# Patient Record
Sex: Male | Born: 1940 | Race: White | Hispanic: No | Marital: Married | Smoking: Former smoker
Health system: Southern US, Community
[De-identification: ages and names within clinical notes are randomized; demographics above are authoritative.]

## PROBLEM LIST (undated history)

## (undated) DIAGNOSIS — I499 Cardiac arrhythmia, unspecified: Secondary | ICD-10-CM

## (undated) DIAGNOSIS — C61 Malignant neoplasm of prostate: Secondary | ICD-10-CM

## (undated) DIAGNOSIS — M1711 Unilateral primary osteoarthritis, right knee: Secondary | ICD-10-CM

## (undated) DIAGNOSIS — I10 Essential (primary) hypertension: Secondary | ICD-10-CM

## (undated) DIAGNOSIS — E7800 Pure hypercholesterolemia, unspecified: Secondary | ICD-10-CM

## (undated) DIAGNOSIS — G4730 Sleep apnea, unspecified: Secondary | ICD-10-CM

## (undated) DIAGNOSIS — I639 Cerebral infarction, unspecified: Secondary | ICD-10-CM

## (undated) DIAGNOSIS — C801 Malignant (primary) neoplasm, unspecified: Secondary | ICD-10-CM

## (undated) DIAGNOSIS — R0600 Dyspnea, unspecified: Secondary | ICD-10-CM

## (undated) HISTORY — PX: OTHER SURGICAL HISTORY: SHX169

## (undated) HISTORY — PX: EYE SURGERY: SHX253

## (undated) HISTORY — PX: COLONOSCOPY: SHX174

## (undated) HISTORY — PX: KNEE ARTHROSCOPY: SHX127

## (undated) HISTORY — DX: Unilateral primary osteoarthritis, right knee: M17.11

## (undated) HISTORY — PX: TONSILLECTOMY: SUR1361

---

## 2004-10-30 ENCOUNTER — Inpatient Hospital Stay (HOSPITAL_COMMUNITY): Admission: EM | Admit: 2004-10-30 | Discharge: 2004-10-31 | Payer: Self-pay | Admitting: Emergency Medicine

## 2005-03-03 ENCOUNTER — Encounter: Admission: RE | Admit: 2005-03-03 | Discharge: 2005-03-03 | Payer: Self-pay | Admitting: Neurology

## 2006-01-02 ENCOUNTER — Encounter: Admission: RE | Admit: 2006-01-02 | Discharge: 2006-01-02 | Payer: Self-pay | Admitting: Interventional Cardiology

## 2006-01-08 ENCOUNTER — Inpatient Hospital Stay (HOSPITAL_BASED_OUTPATIENT_CLINIC_OR_DEPARTMENT_OTHER): Admission: RE | Admit: 2006-01-08 | Discharge: 2006-01-08 | Payer: Self-pay | Admitting: Interventional Cardiology

## 2010-02-26 ENCOUNTER — Ambulatory Visit (HOSPITAL_BASED_OUTPATIENT_CLINIC_OR_DEPARTMENT_OTHER): Admission: RE | Admit: 2010-02-26 | Discharge: 2010-02-26 | Payer: Self-pay | Admitting: Orthopedic Surgery

## 2010-07-02 LAB — BASIC METABOLIC PANEL
BUN: 12 mg/dL (ref 6–23)
CO2: 33 mEq/L — ABNORMAL HIGH (ref 19–32)
Calcium: 9.1 mg/dL (ref 8.4–10.5)
Chloride: 103 mEq/L (ref 96–112)
Creatinine, Ser: 0.88 mg/dL (ref 0.4–1.5)
GFR calc Af Amer: >60 mL/min (ref >60)
GFR calc non Af Amer: >60 mL/min (ref >60)
Glucose, Bld: 101 mg/dL — ABNORMAL HIGH (ref 70–99)
Potassium: 4.2 mEq/L (ref 3.5–5.1)
Sodium: 141 mEq/L (ref 135–145)

## 2010-07-02 LAB — POCT HEMOGLOBIN-HEMACUE: Hemoglobin: 16.2 g/dL (ref 13.0–17.0)

## 2010-09-06 NOTE — H&P (Signed)
NAME:  WASIM, HURLBUT NO.:  [id number]   MEDICAL RECORD NO.:  [id number]          PATIENT TYPE:  INP   LOCATION:  2012                         FACILITY:  [facility]   PHYSICIAN:  Marolyn Hammock. Reynolds, M.D.DATE OF BIRTH:  30-Dec-1940   DATE OF ADMISSION:  10/30/2004  DATE OF DISCHARGE:                                HISTORY & PHYSICAL   CHIEF COMPLAINT:  Right-sided numbness.   HISTORY OF PRESENT ILLNESS:  This is the initial [facility] system admission for this 70 year old male with no real past  medical history. He had the onset today of right-handed numbness and  tingling sensation at about 2 p.m. which initially came and went but  eventually came on and stayed. Shortly, thereafter, he noticed numbness on  the right side of his face and tongue and also some numbness on the right  foot as well. He has had transient right-handed numbness in the past but has  never had symptoms in the face or foot before. He came to the emergency room  for evaluation. He denies any associated slurred speech, visual changes,  weakness, or clumsiness in the extremities. He denies any associated chest  pain, headache, shortness of breath, nausea, vomiting, or loss of  consciousness. He is admitted for further evaluation of suspected stroke  syndrome.   PAST MEDICAL HISTORY:  He was told several years ago that he had a right  bundle branch block on a routine EKG. He denies any history of hypertension,  diabetes, or other chronic medical problems.   FAMILY HISTORY:  His mother died of a stroke at age 66.   SOCIAL HISTORY:  He smokes cigars daily. He consumes alcohol about twice a  week. He works in [employer].   ALLERGIES:  No known drug allergies.   REVIEW OF SYSTEMS:  Full 10-point review of systems is negative except as  outlined in the HPI and admission nursing record.   PHYSICAL EXAMINATION:  VITAL SIGNS:  Temperature 99.2, blood pressure  139/75, pulse 69,  respirations 18.  GENERAL:  This is a healthy-appearing man supine in no evident distress.  HEENT:  Normocephalic and atraumatic. Oropharynx is benign.  NECK:  Supple without carotids or supraclavicular bruits.  HEART:  Regular rate and rhythm without murmurs.  CHEST:  Clear to auscultation bilaterally.  ABDOMEN:  Soft. No hepatosplenomegaly. Normal active bowel sounds.  EXTREMITIES:  No edema. There are 1+ pulses.  NEUROLOGICAL:  He is awake, alert, and fully oriented to time, place, and  person. Recent and remote memory are intact. Attention span, concentration,  and fund of knowledge are all appropriate. Speech is fluent and not  dysarthric. He has no defects to confrontational naming. Can repeat phrase.  Mood is euthymic and affect appropriate. Cranial nerves:  Funduscopic exam  is benign. Pupils are equal and briskly reactive. Extraocular movements full  without nystagmus. Visual fields are full to confrontation. Hearing is  intact to finger rub. Facial sensation is intact to pinprick. Face, tongue,  and palpate move normally and symmetrically. Shoulder shrug strength is  normal. Motor testing shows  normal bulk and tone. Normal strength in all  tested extremity muscles. Sensation:  He reports diminished pinprick  sensation over the palmar surface of the fingers of the right hand.  Otherwise intact to pinprick and light touch throughout. Coordination and  rapid movement are full and well. Finger-to-nose are full and well. Gait:  He arises from the gurney easily and his stance is normal. He is able to  ambulate without much difficulty. Reflexes are 2+ and symmetric. Toes are  downgoing.   LABORATORY DATA:  CBC is pending at this time. Chemistries are remarkable  for a mildly elevated glucose of 111. Coagulations are normal. CT of the  head is personally reviewed and the study is normal.   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 He has  no known risk factors for cerebrovascular disease.   PLAN:  We will admit for a routine stroke workup including MRI, MRA, carotid  transcranial Doppler's, 2-D echocardiogram, telemetry, and stroke labs. We  will treat with aspirin until the mechanism of his stroke syndrome is more  clear. Stroke service to follow.       MLR/MEDQ  D:  10/30/2004  T:  10/31/2004  Job:  [id number]   cc:   Oley Balm. Georgina Pillion, M.D.  [address]  [place]  [place] [address]  Fax: [phone]

## 2010-09-06 NOTE — H&P (Signed)
NAME:  SHAHIEM, Barton NO.:  [id number]   MEDICAL RECORD NO.:  [id number]          PATIENT TYPE:  INP   LOCATION:  2012                         FACILITY:  [facility]   PHYSICIAN:  Sherin Quarry, MD      DATE OF BIRTH:  1940/11/02   DATE OF ADMISSION:  10/30/2004  DATE OF DISCHARGE:                                HISTORY & PHYSICAL   HISTORY OF PRESENT ILLNESS:  Caleb Barton is a 70 year old man whose past  history is remarkable only for a history of sleep apnea for which he uses  nasal C-PAP. It is also noteworthy that he smokes cigars regularly. The  patient states that he works in his home and today about 2 p.m. he was using  the computer when he noted that the right hand on the mouse felt numb and  tingling. On further examination, the tingling and paresthesias seemed to be  confined to the palmar surface of the hand and to involve all the fingers.  It seemed to spare the dorsum of the hand. He shook his hand for several  minutes but it did not seem to get any better. After about 15 minutes, the  paresthesias went away and then about 5 minutes later he noted that once  again his hand was tingling and he also noted some tingling of the right  side of his lips and the right side of his tongue. He stood up and walked  around but it did not seem to help. While he is walking around, he  experienced transient tingling of his right foot. There was no headache, no  visual blurring, no diplopia, no motor impairment, no ataxia or falling.  There was no syncope. Eventually he called his wife and said that he thought  he had better come to the hospital to be checked. On arrival in the  emergency room, he was noted to have a blood pressure 139/75, pulse was 69,  respirations were 18. He was sent for CT scan of the brain which I reviewed  with the radiologist and was normal. Laboratory studies obtained included an  I-stat which was normal, normal clotting studies. In light of  this  presentation, the patient will be admitted for evaluation of TIA versus  stroke.   MEDICATIONS:  The patient takes no medications. He also takes no over-the-  counter medications.   ALLERGIES:  There are no known drug allergies.   PAST SURGICAL HISTORY:  The only operation he can recall was excision of a  cyst 25 years ago.   PAST MEDICAL HISTORY:  Medical illnesses are none. Injuries are none.  Hospitalization none.   FAMILY HISTORY:  Possibly significant in that the patient's mother had a  series of TIAs and eventually had a massive stroke. It sounds like perhaps a  brain stem stroke at the age of 7 and died as a result of this. His father  died when he was 53-1/2 years old from drowning. His brother is in good  health. He also has a half-sister who is in good health.   SOCIAL  HISTORY:  The patient smokes cigars regularly. He has smoked  cigarettes the past. He states that he drinks about a six-pack of beer per  week.   REVIEW OF SYSTEMS:  HEAD:  He denies headache or dizziness. EYES:  He denies  visual blurring or diplopia. EAR, NOSE AND THROAT:  Denies earache, sinus  pain or sore throat. CHEST:  Denies coughing, wheezing or chest congestion.  CARDIOVASCULAR:  Denies orthopnea, PND or ankle edema. There has been no  history of chest pain. GI:  Denies nausea, vomiting, abdominal pain, change  in bowel habits, melena or hematochezia. GU:  Denies dysuria or urinary  frequency. NEUROLOGICAL:  See above. ENDOCRINE:  No history excessive  thirst, urinary frequency or nocturia.   PHYSICAL EXAMINATION:  VITAL SIGNS:  Temperature 99.2, blood pressure  139/75, pulse 69, respirations 18, O2 saturations 97%.  HEENT:  The pupils are equal and reactive. Extraocular movements are full.  The tympanic membranes are clear. Nares is patent. Pharynx is without  erythema or exudate.  NECK:  Neck is supple. There is no lymphadenopathy or thyromegaly.  CHEST:  The chest is clear.  BACK:   Examination the back reveals no CVA or point tenderness.  CARDIOVASCULAR:  Reveals normal S1-S2. There are no rubs, murmurs or  gallops.  ABDOMEN:  The abdomen is benign with normal bowel sounds without masses,  tenderness, organomegaly.  NEUROLOGICAL:  Cranial nerves II-XII are normal. Motor exam is normal.  Sensory exam is normal to the extent that I can determine. On cerebellar  testing, the patient is able to perform finger-nose-finger and heel-knee-  shin testing normally. Gait was not tested.   IMPRESSION:  1.  Possible left-sided transient ischemic attack versus stroke presenting      with paresthesias of lips, right hand and tongue:  This appears to be      purely sensory presentation.  2.  Possible hyperlipidemia.  3.  History of cigarette and cigars smoking.  4.  Sleep apnea by history.   PLAN:  We will obtain lipid profile and homocysteine level. We will schedule  MRI and MRA scan. We will obtain carotid Doppler and 2-D echo. We will start  the patient on Aggrenox 1 tablet p.o. b.i.d. The patient was advised in  strong terms that he must discontinue cigarette smoking.       SY/MEDQ  D:  10/30/2004  T:  10/30/2004  Job:  [id number]   cc:   Oley Balm. Georgina Pillion, M.D.  [address]  [place]  [place] [address]  Fax: [phone]

## 2010-09-06 NOTE — Discharge Summary (Signed)
NAME:  PARRY, PO NO.:  [id number]   MEDICAL RECORD NO.:  [id number]          PATIENT TYPE:  INP   LOCATION:  2012                         FACILITY:  [facility]   PHYSICIAN:  Sherin Quarry, MD      DATE OF BIRTH:  May 21, 1940   DATE OF ADMISSION:  10/30/2004  DATE OF DISCHARGE:  10/31/2004                                 DISCHARGE SUMMARY   Caleb Barton is a 69 year old man who initially presented to [facility] on July 12 with onset approximately 2 p.m. of numbness of the right  hand.  This was subsequently associated with numbness and tingling involving  the right side of his lips and right side of the tongue.  He stood up and  walked around for a few minutes but it did not seem to make any difference  in the symptoms.  He subsequently experienced transient numbness and  tingling of his right foot.  There was no associated headache, no visual  blurring or diplopia, no motor impairment.  In light of these findings, the  patient was admitted to the hospital for further evaluation.  In the  emergency room studies obtained included an I-stat which revealed potassium  of 3.5, glucose of 111, and normal renal function.  A1C hemoglobin was 5.7.  Liver profile was normal.  CT scan of the head was negative.   PHYSICAL EXAMINATION:  VITAL SIGNS:  Blood pressure was 139/75, pulse was  69, respirations 18.  HEENT:  Within normal limits.  CHEST:  Clear.  CARDIOVASCULAR:  Normal S1-S2 without rubs, murmurs, or gallops.  ABDOMEN:  Benign.  Normal bowel sounds without masses or tenderness.  NEUROLOGIC:  The patient was oriented x3.  He had normal speech.  Cranial  nerves II-XII were intact.  Motor testing was completely normal.  The  patient reported diminished sensation over the palmar surface of the fingers  of his right hand.  No other abnormalities could be detected on sensory  examination.   Subsequently, the patient underwent a carotid Doppler study which  showed no  hemodynamically significant ICA stenosis.  An echocardiogram was obtained.  The result of this study is pending.  MRI and MRA scan were done and these  results were reviewed with Dr. Pearlean Brownie.  Basically, there were two findings.  The first was a small left thalamic infarct which appeared to be acute.  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  When Dr. Pearlean Brownie reviewed the appearance of the cyst he felt it had features  suggesting that it was benign in nature.  He did not think that the cyst was  the cause of the patient's symptoms.  He recommended that a follow-up be  planned with his office in two months to repeat the MRI scan.  He discussed  these recommendations with the patient's wife.  On July 13 as the patient  was doing well it was felt reasonable to discharge him.   DISCHARGE DIAGNOSES:  1.  Acute left thalamic infarct.  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  3.  History of sleep apnea.  4.  Possible history of hyperlipidemia.  5.  History of cigarette and cigar smoking.   On discharge the patient was instructed to take Aggrenox one tablet p.o.  b.i.d.  He was advised to absolutely stop smoking at this time.  He was  advised to return to see Dr. Pearlean Brownie in the office in approximately two months  and to continue to follow up on a regular basis with Dr. Georgina Pillion at [facility] [place].   CONDITION ON DISCHARGE:  Good.       SY/MEDQ  D:  10/31/2004  T:  10/31/2004  Job:  [id number]   cc:   Oley Balm. Georgina Pillion, M.D.  [address]  [place]   [place] [address]  Fax: [phone]   Pramod P. Pearlean Brownie, MD  Fax: [phone]

## 2010-09-06 NOTE — Cardiovascular Report (Signed)
NAME:  Caleb Barton, Caleb Barton NO.:  [id number]   MEDICAL RECORD NO.:  [id number]          PATIENT TYPE:  OIB   LOCATION:  1962                         FACILITY:  [facility]   PHYSICIAN:  Lyn Records, M.D.   DATE OF BIRTH:  08/21/1940   DATE OF PROCEDURE:  01/08/2006  DATE OF DISCHARGE:                              CARDIAC CATHETERIZATION   INDICATIONS:  The patient was referred by Dr. Lajean Manes for  cardiovascular evaluation.  He has a family history and personal history of  stroke and hypertension.  His Cardiolite study demonstrated inferior  infarction with peri-infarction ischemia versus diaphragm attenuation.  This  study is being done to rule out underlying coronary artery obstructive  disease.   PROCEDURES PERFORMED:  1. Left heart catheterization.  2. Selective coronary angiography.  3. Left ventriculography.   DESCRIPTION:  After informed consent, a 4-French sheath was placed in the  right femoral artery using modified Seldinger technique.  A 4-French A2  multipurpose catheter was used for left ventricular hand injection,  hemodynamic recordings and right coronary angiography.  A 4-French JR-4 and  JL-4 were also used coronary angiography.  The patient tolerated the  procedure without complications.   RESULTS:  1. Hemodynamic data:      a.     The aortic pressure was 37/79.      b.     Left ventricular pressure 137/10 mmHg.   1. Left ventriculography:  Left ventricular cavity size and systolic      function are normal.  Ejection fraction is at least 60%.  No mitral      regurgitation is identified.   1. Coronary angiography:  The left coronary contains very minimal      calcification.      a.     Left main coronary:  Widely patent distal left main       calcification is seen.      b.     Left anterior descending coronary:  The LAD is relatively small,       giving origin to a large bifurcating first diagonal.  There is also a       large first septal  perforator and the LAD is relatively small coursing       to the distal anterior wall.  It does not wrap around the left       ventricle apex.  Minimal luminal irregularities noted in the LAD.       There is mid LAD systolic compression.      c.     Circumflex artery:  The circumflex coronary artery is relatively       small, giving origin to three obtuse marginal branches and are free of       any significant obstruction.      d.     Ramus intermedius branch:  A moderate-sized ramus branch arises       from the distal left main and is free of any significant obstruction.      e.     Right coronary artery:  Right coronary artery is  a large       dominant vessel, large PDA, large left ventricular branches.  Minimal       luminal irregularities are noted.   CONCLUSION:  1. Essentially normal coronary arteries.  2. Overall normal left ventricular function.  3. False positive Cardiolite study.      Lyn Records, M.D.  Electronically Signed     HWS/MEDQ  D:  01/08/2006  T:  01/09/2006  Job:  [id number]   cc:   Oley Balm. Georgina Pillion, M.D.

## 2011-08-20 ENCOUNTER — Ambulatory Visit (HOSPITAL_COMMUNITY)
Admission: RE | Admit: 2011-08-20 | Discharge: 2011-08-20 | Disposition: A | Discharge status: Home / Self Care | Payer: Medicare Other | Source: Ambulatory Visit | Attending: Gastroenterology | Admitting: Gastroenterology

## 2011-08-20 ENCOUNTER — Encounter (HOSPITAL_COMMUNITY)
Admission: RE | Disposition: A | Discharge status: Home / Self Care | Payer: Self-pay | Source: Ambulatory Visit | Attending: Gastroenterology

## 2011-08-20 ENCOUNTER — Encounter (HOSPITAL_COMMUNITY): Payer: Self-pay | Admitting: Gastroenterology

## 2011-08-20 DIAGNOSIS — Z79899 Other long term (current) drug therapy: Secondary | ICD-10-CM | POA: Insufficient documentation

## 2011-08-20 DIAGNOSIS — Z8673 Personal history of transient ischemic attack (TIA), and cerebral infarction without residual deficits: Secondary | ICD-10-CM | POA: Insufficient documentation

## 2011-08-20 DIAGNOSIS — Z7982 Long term (current) use of aspirin: Secondary | ICD-10-CM | POA: Insufficient documentation

## 2011-08-20 DIAGNOSIS — G4730 Sleep apnea, unspecified: Secondary | ICD-10-CM | POA: Insufficient documentation

## 2011-08-20 DIAGNOSIS — I10 Essential (primary) hypertension: Secondary | ICD-10-CM | POA: Insufficient documentation

## 2011-08-20 DIAGNOSIS — D126 Benign neoplasm of colon, unspecified: Secondary | ICD-10-CM | POA: Insufficient documentation

## 2011-08-20 HISTORY — DX: Sleep apnea, unspecified: G47.30

## 2011-08-20 HISTORY — DX: Cerebral infarction, unspecified: I63.9

## 2011-08-20 HISTORY — PX: COLONOSCOPY: SHX5424

## 2011-08-20 HISTORY — DX: Essential (primary) hypertension: I10

## 2011-08-20 HISTORY — DX: Cardiac arrhythmia, unspecified: I49.9

## 2011-08-20 SURGERY — COLONOSCOPY
Anesthesia: Moderate Sedation

## 2011-08-20 MED ORDER — FENTANYL CITRATE 0.05 MG/ML IJ SOLN
INTRAMUSCULAR | Status: AC
Start: 2011-08-20 — End: 2011-08-20
  Filled 2011-08-20: qty 4

## 2011-08-20 MED ORDER — MIDAZOLAM HCL 10 MG/2ML IJ SOLN
INTRAMUSCULAR | Status: AC
  Filled 2011-08-20: qty 4

## 2011-08-20 MED ORDER — SODIUM CHLORIDE 0.9 % IV SOLN
Freq: Once | INTRAVENOUS | Status: AC
  Administered 2011-08-20: 500 mL via INTRAVENOUS

## 2011-08-20 MED ORDER — MIDAZOLAM HCL 5 MG/5ML IJ SOLN
INTRAMUSCULAR | Status: DC | PRN
  Administered 2011-08-20: 2 mg via INTRAVENOUS
  Administered 2011-08-20: 1 mg via INTRAVENOUS
  Administered 2011-08-20 (×2): 2 mg via INTRAVENOUS

## 2011-08-20 MED ORDER — FENTANYL NICU IV SYRINGE 50 MCG/ML
INJECTION | INTRAMUSCULAR | Status: DC | PRN
  Administered 2011-08-20 (×2): 25 ug via INTRAVENOUS
  Administered 2011-08-20: 10 ug via INTRAVENOUS

## 2011-08-20 NOTE — Op Note (Signed)
[facility] [address] [place], [place]  [address]  COLONOSCOPY PROCEDURE REPORT  PATIENT:  Caleb Barton, Caleb Barton  MR#:  [id number] BIRTHDATE:  Jul 23, 1940, 70 yrs. old  GENDER:  male ENDOSCOPIST:  Carman Ching REF. BY:  Shaune Pollack, M.D. PROCEDURE DATE:  08/20/2011 PROCEDURE:  Colonoscopy with snare polypectomy ASA CLASS:  Class II INDICATIONS:  patient had a polyp removed recently from the cecum with possible and complete removal. Path report showed high-grade dysplasia. MEDICATIONS:   Fentanyl 60 mcg IV, Versed 7 mg IV  DESCRIPTION OF PROCEDURE:   After the risks and benefits and of the procedure were explained, informed consent was obtained. Digital rectal exam was performed and revealed no abnormalities. The [id number] endoscope was introduced through the anus and advanced to the cecum.  The quality of the prep was good..  The instrument was then slowly withdrawn as the colon was fully examined. <<PROCEDUREIMAGES>>  FINDINGS:  A sessile polyp was found in the cecum. a small amount of residual polyp that was located just under the ileocecal valve. Polyps were snared, then cauterized with monopolar cautery. Retrieval was successful. snare polyp 2 pieces were removed and the area cauterized with the tip of the snare with very good results. At the termination of the procedure there was no residual polyp.   Retroflexion was not performed.  The scope was then withdrawn from the patient and the procedure completed.  COMPLICATIONS:  A complication of none occured on 08/20/2011 at. ENDOSCOPIC IMPRESSION: 1) Sessile polyp in the cecum it is felt that this has been removed completely. There was no residual polyp at the termination of the procedure area RECOMMENDATIONS: 1) Hold aspirin, aspirin products, and anti-inflammatory medication for 1 week. 2) Repeat Colonoscopy in 1 year. we'll also have him hold his Plavix.colonoscopy will be repeated in one year due to the fact that  this polyp did have high-grade dysplasia  REPEAT EXAM:  In for Colonoscopy.  ______________________________ Carman Ching  CC:  Shaune Pollack, MD  n. Rosalie DoctorCarman Ching at 08/20/2011 10:23 AM  Forest Gleason, [phone]

## 2011-08-20 NOTE — Discharge Instructions (Signed)
Hold aspirin and plavix for 1 week then resume. Repeat colon 1 year

## 2011-08-20 NOTE — H&P (Signed)
  Subjective:   Patient is a 71 y.o. male presents with Polyp in cecum with HGD and need for possible APC. Procedure including risks and benefits discussed in office.  There are no active problems to display for this patient.  Past Medical History  Diagnosis Date  . Hypertension   . Dysrhythmia     RBBB  . Stroke     TIA 15 yrs ago  . Sleep apnea     on CPAP nightly    Past Surgical History  Procedure Date  . Tonsillectomy   . Colonoscopy     with polyps    Prescriptions prior to admission  Medication Sig Dispense Refill  . aspirin 325 MG EC tablet Take 325 mg by mouth daily.      [name] dipyridamole-aspirin (AGGRENOX) 25-200 MG per 12 hr capsule Take 1 capsule by mouth daily at 6 PM.      . hydrochlorothiazide (HYDRODIURIL) 25 MG tablet Take 25 mg by mouth daily.      [name] losartan (COZAAR) 50 MG tablet Take 50 mg by mouth daily.      . multivitamin-iron-minerals-folic acid (CENTRUM) chewable tablet Chew 1 tablet by mouth daily.      . niacin 500 MG tablet Take 500 mg by mouth daily with breakfast.      . simvastatin (ZOCOR) 80 MG tablet Take 80 mg by mouth at bedtime.       Allergies  Allergen Reactions  . Sulfa Antibiotics     History  Substance Use Topics  . Smoking status: Not on file  . Smokeless tobacco: Not on file  . Alcohol Use: Yes     social    Family History  Problem Relation Age of Onset  . Stroke Mother      Objective:   Patient Vitals for the past 8 hrs:  BP Temp Temp src Pulse Resp SpO2 Height Weight  08/20/11 0854 124/79 mmHg 97.8 F (36.6 C) Oral 60  13  94 % 5\' 9"  (1.753 m) 102.059 kg (225 lb)         See MD Preop evaluation      Assessment:   Active Problems:  * No active hospital problems. *    Plan:   Proceed with colon/ polyp and ? APC

## 2011-08-21 ENCOUNTER — Encounter (HOSPITAL_COMMUNITY): Payer: Self-pay | Admitting: Gastroenterology

## 2011-08-21 ENCOUNTER — Encounter (HOSPITAL_COMMUNITY): Payer: Self-pay

## 2011-10-06 ENCOUNTER — Other Ambulatory Visit: Payer: Self-pay | Admitting: Family Medicine

## 2011-10-06 DIAGNOSIS — E041 Nontoxic single thyroid nodule: Secondary | ICD-10-CM

## 2011-10-08 ENCOUNTER — Ambulatory Visit
Admission: RE | Admit: 2011-10-08 | Discharge: 2011-10-08 | Disposition: A | Discharge status: Home / Self Care | Payer: Medicare Other | Source: Ambulatory Visit | Attending: Family Medicine | Admitting: Family Medicine

## 2011-10-08 DIAGNOSIS — E041 Nontoxic single thyroid nodule: Secondary | ICD-10-CM

## 2011-10-16 ENCOUNTER — Other Ambulatory Visit: Payer: Self-pay | Admitting: Family Medicine

## 2011-10-16 DIAGNOSIS — E041 Nontoxic single thyroid nodule: Secondary | ICD-10-CM

## 2011-10-22 ENCOUNTER — Other Ambulatory Visit (HOSPITAL_COMMUNITY)
Admission: RE | Admit: 2011-10-22 | Discharge: 2011-10-22 | Disposition: A | Discharge status: Home / Self Care | Payer: Medicare Other | Source: Ambulatory Visit | Attending: Interventional Radiology | Admitting: Interventional Radiology

## 2011-10-22 ENCOUNTER — Ambulatory Visit
Admission: RE | Admit: 2011-10-22 | Discharge: 2011-10-22 | Disposition: A | Discharge status: Home / Self Care | Payer: Medicare Other | Source: Ambulatory Visit | Attending: Family Medicine | Admitting: Family Medicine

## 2011-10-22 DIAGNOSIS — E049 Nontoxic goiter, unspecified: Secondary | ICD-10-CM | POA: Insufficient documentation

## 2011-10-22 DIAGNOSIS — E041 Nontoxic single thyroid nodule: Secondary | ICD-10-CM

## 2012-01-23 ENCOUNTER — Encounter (HOSPITAL_COMMUNITY): Payer: Self-pay | Admitting: Pharmacy Technician

## 2012-02-03 ENCOUNTER — Other Ambulatory Visit: Payer: Self-pay | Admitting: Physician Assistant

## 2012-02-03 ENCOUNTER — Encounter: Payer: Self-pay | Admitting: Physician Assistant

## 2012-02-03 DIAGNOSIS — I639 Cerebral infarction, unspecified: Secondary | ICD-10-CM | POA: Insufficient documentation

## 2012-02-03 DIAGNOSIS — I10 Essential (primary) hypertension: Secondary | ICD-10-CM | POA: Insufficient documentation

## 2012-02-03 DIAGNOSIS — M1711 Unilateral primary osteoarthritis, right knee: Secondary | ICD-10-CM | POA: Insufficient documentation

## 2012-02-03 DIAGNOSIS — I499 Cardiac arrhythmia, unspecified: Secondary | ICD-10-CM | POA: Insufficient documentation

## 2012-02-03 DIAGNOSIS — G4730 Sleep apnea, unspecified: Secondary | ICD-10-CM | POA: Insufficient documentation

## 2012-02-03 NOTE — H&P (Signed)
TOTAL KNEE ADMISSION H&P  Patient is being admitted for right total knee arthroplasty.  Subjective:  Chief Complaint:right knee pain.  HPI: Caleb Barton, 71 y.o. male, has a history of pain and functional disability in the right knee due to arthritis and has failed non-surgical conservative treatments for greater than 12 weeks to includeNSAID's and/or analgesics, corticosteriod injections, flexibility and strengthening excercises, weight reduction as appropriate and activity modification.  Onset of symptoms was gradual, starting 5 years ago with gradually worsening course since that time. The patient noted prior procedures on the knee to include  arthroscopy and menisectomy on the right knee(s).  Patient currently rates pain in the right knee(s) at 10 out of 10 with activity. Patient has night pain, worsening of pain with activity and weight bearing, pain that interferes with activities of daily living, pain with passive range of motion, crepitus and joint swelling.  Patient has evidence of subchondral sclerosis, periarticular osteophytes and joint space narrowing by imaging studies.  There is no active infection.  Patient Active Problem List   Diagnosis Date Noted  . Hypertension   . Dysrhythmia   . Stroke   . Sleep apnea   . Right knee DJD    Past Medical History  Diagnosis Date  . Hypertension   . Dysrhythmia     RBBB  . Stroke     TIA 15 yrs ago  . Sleep apnea     on CPAP nightly  . Right knee DJD     Past Surgical History  Procedure Date  . Tonsillectomy   . Colonoscopy     with polyps  . Colonoscopy 08/20/2011    Procedure: COLONOSCOPY;  Surgeon: Vertell Novak., MD;  Location: [facility];  Service: Endoscopy;  Laterality: N/A;     (Not in a hospital admission) Allergies  Allergen Reactions  . Sulfa Antibiotics Rash    History  Substance Use Topics  . Smoking status: Never Smoker   . Smokeless tobacco: Not on file  . Alcohol Use: Yes     social    Family  History  Problem Relation Age of Onset  . Stroke Mother      Review of Systems  Constitutional: Negative.   HENT: Negative.   Eyes: Negative.   Respiratory: Negative.   Cardiovascular: Negative.   Gastrointestinal: Negative.   Genitourinary: Negative.   Musculoskeletal: Positive for joint pain.       Right knee  Skin: Negative.   Neurological: Negative.   Endo/Heme/Allergies: Bruises/bleeds easily.       On aspirin and persantine for history of TIA  Psychiatric/Behavioral: Negative.     Objective:  Physical Exam  Constitutional: He is oriented to person, place, and time. He appears well-developed and well-nourished.  HENT:  Head: Normocephalic and atraumatic.  Mouth/Throat: Oropharynx is clear and moist.  Eyes: Conjunctivae normal and EOM are normal. Pupils are equal, round, and reactive to light.  Neck: Neck supple.  Cardiovascular: Normal rate and regular rhythm.   Respiratory: Effort normal and breath sounds normal.  GI: Soft. Bowel sounds are normal.  Genitourinary:       Not pertinent to current symptomatology therefore not examined.  Musculoskeletal:       He is independently ambulatory with a moderately antalgic gait.  Right knee has active range of motion -5 to 115 degrees.  Normal patella tracking.  Varus deformity.  2+ crepitus.  1+ effusion.  Distal neurovascular exam is intact.  Left knee has active range of  motion 0-120 degrees.  1+ crepitus.  1+ synovitis.  Mild varus deformity.  Normal patella tracking.  He has a significant antalgic gait due to limping because of his right knee pain.  He has 2+ dorsalis pedis pulses.    Neurological: He is alert and oriented to person, place, and time.  Skin: Skin is warm and dry.  Psychiatric: He has a normal mood and affect. His behavior is normal. Judgment and thought content normal.    Vital signs in last 24 hours: Ht 5"9 Wt 228 lbs BP 155/86 Pulse 58 o2 sat 97% Temp 97.7  Labs:   Estimated Body mass index is  33.23 kg/(m^2) as calculated from the following:   Height as of 08/20/11: 5\' 9" (1.753 m).   Weight as of 08/20/11: 225 lb(102.059 kg).   Imaging Review Plain radiographs demonstrate severe degenerative joint disease of the bilaterally knee(s). The overall alignment issignificant varus. The bone quality appears to be good for age and reported activity level. X-rays are reviewed from 07/25/11 and show significant periarticular spurring with no remaining joint space medially and in the patellofemoral joint with bone on bone arthritis and varus deformity.  Assessment/Plan:  End stage arthritis, right knee Patient Active Problem List  Diagnosis  . Hypertension  . Dysrhythmia  . Stroke  . Sleep apnea  . Right knee DJD      The patient history, physical examination, clinical judgment of the provider and imaging studies are consistent with end stage degenerative joint disease of the right knee(s) and total knee arthroplasty is deemed medically necessary. The treatment options including medical management, injection therapy arthroscopy and arthroplasty were discussed at length. The risks and benefits of total knee arthroplasty were presented and reviewed. The risks due to aseptic loosening, infection, stiffness, patella tracking problems, thromboembolic complications and other imponderables were discussed. The patient acknowledged the explanation, agreed to proceed with the plan and consent was signed. Patient is being admitted for inpatient treatment for surgery, pain control, PT, OT, prophylactic antibiotics, VTE prophylaxis, progressive ambulation and ADL's and discharge planning. The patient is planning to be discharged home with home health services  Taiwana Willison A. Gwinda Passe Physician Assistant [facility] [phone]  02/03/2012, 9:27 AM

## 2012-02-04 ENCOUNTER — Encounter (HOSPITAL_COMMUNITY)
Admission: RE | Admit: 2012-02-04 | Discharge: 2012-02-04 | Disposition: A | Discharge status: Home / Self Care | Payer: Medicare Other | Source: Ambulatory Visit | Attending: Physician Assistant | Admitting: Physician Assistant

## 2012-02-04 ENCOUNTER — Encounter (HOSPITAL_COMMUNITY)
Admission: RE | Admit: 2012-02-04 | Discharge: 2012-02-04 | Disposition: A | Discharge status: Home / Self Care | Payer: Medicare Other | Source: Ambulatory Visit | Attending: Orthopedic Surgery | Admitting: Orthopedic Surgery

## 2012-02-04 ENCOUNTER — Encounter (HOSPITAL_COMMUNITY): Payer: Self-pay

## 2012-02-04 HISTORY — DX: Pure hypercholesterolemia, unspecified: E78.00

## 2012-02-04 HISTORY — DX: Malignant (primary) neoplasm, unspecified: C80.1

## 2012-02-04 LAB — COMPREHENSIVE METABOLIC PANEL
ALT: 39 U/L (ref 0–53)
AST: 29 U/L (ref 0–37)
Albumin: 4 g/dL (ref 3.5–5.2)
CO2: 29 mEq/L (ref 19–32)
Chloride: 101 mEq/L (ref 96–112)
GFR calc non Af Amer: 86 mL/min — ABNORMAL LOW (ref >90)
Potassium: 4 mEq/L (ref 3.5–5.1)
Sodium: 137 mEq/L (ref 135–145)
Total Bilirubin: 0.8 mg/dL (ref 0.3–1.2)

## 2012-02-04 LAB — APTT: aPTT: 29 seconds (ref 24–37)

## 2012-02-04 LAB — ABO/RH: ABO/RH(D): O POS

## 2012-02-04 LAB — URINALYSIS, ROUTINE W REFLEX MICROSCOPIC
Glucose, UA: NEGATIVE mg/dL
Ketones, ur: NEGATIVE mg/dL
Leukocytes, UA: NEGATIVE
Nitrite: NEGATIVE
Protein, ur: NEGATIVE mg/dL
pH: 7 (ref 5.0–8.0)

## 2012-02-04 LAB — TYPE AND SCREEN

## 2012-02-04 NOTE — Pre-Procedure Instructions (Signed)
20 SIAN VALLIER  02/04/2012   Your procedure is scheduled on:  Monday February 09, 2012.  Report to [facility] at 0530 AM.  Call this number if you have problems the morning of surgery: [phone]   Remember:   Do not eat food or drink:After Midnight.    Take these medicines the morning of surgery with A SIP OF WATER: NONE   Do not wear jewelry  Do not wear lotions or colognes  Men may shave face and neck.  Do not bring valuables to the hospital.  Contacts, dentures or bridgework may not be worn into surgery.  Leave suitcase in the car. After surgery it may be brought to your room.  For patients admitted to the hospital, checkout time is 11:00 AM the day of discharge.   Patients discharged the day of surgery will not be allowed to drive home.  Name and phone number of your driver:   Special Instructions: Shower using CHG 2 nights before surgery and the night before surgery.  If you shower the day of surgery use CHG.  Use special wash - you have one bottle of CHG for all showers.  You should use approximately 1/3 of the bottle for each shower.   Please read over the following fact sheets that you were given: Pain Booklet, Coughing and Deep Breathing, Blood Transfusion Information, Total Joint Packet, MRSA Information and Surgical Site Infection Prevention

## 2012-02-05 ENCOUNTER — Other Ambulatory Visit: Payer: Self-pay | Admitting: Physician Assistant

## 2012-02-05 ENCOUNTER — Encounter (HOSPITAL_COMMUNITY)
Admission: RE | Admit: 2012-02-05 | Discharge: 2012-02-05 | Disposition: A | Discharge status: Home / Self Care | Payer: Medicare Other | Source: Ambulatory Visit | Attending: Orthopedic Surgery | Admitting: Orthopedic Surgery

## 2012-02-05 LAB — DIFFERENTIAL
Eosinophils Relative: 3 % (ref 0–5)
Lymphocytes Relative: 23 % (ref 12–46)
Lymphs Abs: 1.5 10*3/uL (ref 0.7–4.0)
Monocytes Absolute: 0.5 10*3/uL (ref 0.1–1.0)
Monocytes Relative: 8 % (ref 3–12)
Neutro Abs: 4.2 10*3/uL (ref 1.7–7.7)

## 2012-02-05 LAB — CBC
HCT: 44 % (ref 39.0–52.0)
Hemoglobin: 15.1 g/dL (ref 13.0–17.0)
MCV: 91.7 fL (ref 78.0–100.0)
RDW: 12.7 % (ref 11.5–15.5)
WBC: 6.4 10*3/uL (ref 4.0–10.5)

## 2012-02-05 NOTE — Progress Notes (Signed)
Urine culture added on after Shonna Chock, PA-C discovered that urine culture was ordered for PAT appointment and no results found in computer.  Lab notified and add on requested. Order reentered per [facility] request.

## 2012-02-05 NOTE — Consult Note (Addendum)
Anesthesia chart review: Patient is a 71 year old male scheduled for right total knee replacement by Dr. Wyline Mood on a 02/09/12. History includes obesity, nonsmoker, hypertension, TIA > 10 years ago, obstructive sleep apnea, hypercholesterolemia, skin cancer, history of right BBB.    EKG on 02/04/12 showed sinus rhythm with short PR, right bundle branch block, left anterior fascicular block, bifascicular block, minimal voltage criteria for LVH.  His EKG was not felt significantly changed from his heart EKG on 02/21/2010.  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  See EKGs in [facility].)  Cardiac cath on 01/08/06 (Dr. Verdis Prime) showed: 1. Essentially normal coronary arteries.  2. Overall normal left ventricular function. EF 60%. 3. False positive Cardiolite study.  Chest x-ray on 02/04/2012 showed no acute cardiopulmonary disease. Lung hyperexpansion suggesting COPD.  Labs noted.  The CBC and urine culture were not done at PAT.  Lab plans to add on urine culture.  Our scheduler will contact the patient to see if he can come in for a CBC with diff.  Kirstin Shepperson, PA-C notified.  Shonna Chock, PA-C 02/05/12 1252  Addendum: 02/06/12 1555 Patient returned on 02/05/12 for his CBC.  Results noted.  Urine culture showed no growth.  Anticipate he can proceed as planned.

## 2012-02-06 LAB — URINE CULTURE

## 2012-02-08 MED ORDER — CEFAZOLIN SODIUM-DEXTROSE 2-3 GM-% IV SOLR
2.0000 g | INTRAVENOUS | Status: AC
  Administered 2012-02-09: 2 g via INTRAVENOUS
  Filled 2012-02-08 (×2): qty 50

## 2012-02-09 ENCOUNTER — Encounter (HOSPITAL_COMMUNITY): Payer: Self-pay | Admitting: *Deleted

## 2012-02-09 ENCOUNTER — Encounter (HOSPITAL_COMMUNITY): Payer: Self-pay

## 2012-02-09 ENCOUNTER — Inpatient Hospital Stay (HOSPITAL_COMMUNITY)
Admission: RE | Admit: 2012-02-09 | Discharge: 2012-02-10 | DRG: 470 | Disposition: A | Discharge status: Home Health | Payer: Medicare Other | Source: Ambulatory Visit | Attending: Orthopedic Surgery | Admitting: Orthopedic Surgery

## 2012-02-09 ENCOUNTER — Inpatient Hospital Stay (HOSPITAL_COMMUNITY): Payer: Medicare Other | Admitting: Vascular Surgery

## 2012-02-09 ENCOUNTER — Encounter (HOSPITAL_COMMUNITY)
Admission: RE | Disposition: A | Discharge status: Home Health | Payer: Self-pay | Source: Ambulatory Visit | Attending: Orthopedic Surgery

## 2012-02-09 ENCOUNTER — Encounter (HOSPITAL_COMMUNITY): Payer: Self-pay | Admitting: Vascular Surgery

## 2012-02-09 DIAGNOSIS — Z79899 Other long term (current) drug therapy: Secondary | ICD-10-CM

## 2012-02-09 DIAGNOSIS — I499 Cardiac arrhythmia, unspecified: Secondary | ICD-10-CM | POA: Diagnosis present

## 2012-02-09 DIAGNOSIS — I10 Essential (primary) hypertension: Secondary | ICD-10-CM | POA: Diagnosis present

## 2012-02-09 DIAGNOSIS — Z8673 Personal history of transient ischemic attack (TIA), and cerebral infarction without residual deficits: Secondary | ICD-10-CM

## 2012-02-09 DIAGNOSIS — Z01812 Encounter for preprocedural laboratory examination: Secondary | ICD-10-CM

## 2012-02-09 DIAGNOSIS — Z7982 Long term (current) use of aspirin: Secondary | ICD-10-CM

## 2012-02-09 DIAGNOSIS — E7800 Pure hypercholesterolemia, unspecified: Secondary | ICD-10-CM | POA: Diagnosis present

## 2012-02-09 DIAGNOSIS — Z882 Allergy status to sulfonamides status: Secondary | ICD-10-CM

## 2012-02-09 DIAGNOSIS — I739 Peripheral vascular disease, unspecified: Secondary | ICD-10-CM | POA: Diagnosis present

## 2012-02-09 DIAGNOSIS — M1711 Unilateral primary osteoarthritis, right knee: Secondary | ICD-10-CM

## 2012-02-09 DIAGNOSIS — G4733 Obstructive sleep apnea (adult) (pediatric): Secondary | ICD-10-CM | POA: Diagnosis present

## 2012-02-09 DIAGNOSIS — M1710 Unilateral primary osteoarthritis, unspecified knee: Principal | ICD-10-CM | POA: Diagnosis present

## 2012-02-09 DIAGNOSIS — Z823 Family history of stroke: Secondary | ICD-10-CM

## 2012-02-09 DIAGNOSIS — Z23 Encounter for immunization: Secondary | ICD-10-CM

## 2012-02-09 DIAGNOSIS — G4730 Sleep apnea, unspecified: Secondary | ICD-10-CM | POA: Diagnosis present

## 2012-02-09 HISTORY — PX: TOTAL KNEE ARTHROPLASTY: SHX125

## 2012-02-09 SURGERY — ARTHROPLASTY, KNEE, TOTAL
Anesthesia: General | Site: Knee | Laterality: Right | Wound class: Clean

## 2012-02-09 MED ORDER — HYDROMORPHONE HCL PF 1 MG/ML IJ SOLN
INTRAMUSCULAR | Status: AC
  Filled 2012-02-09: qty 1

## 2012-02-09 MED ORDER — METOCLOPRAMIDE HCL 10 MG PO TABS: 5.0000 mg | ORAL_TABLET | Freq: Three times a day (TID) | ORAL | Status: DC | PRN

## 2012-02-09 MED ORDER — PROPOFOL 10 MG/ML IV BOLUS
INTRAVENOUS | Status: DC | PRN
  Administered 2012-02-09: 150 mg via INTRAVENOUS

## 2012-02-09 MED ORDER — HYDROMORPHONE HCL PF 1 MG/ML IJ SOLN
0.2500 mg | INTRAMUSCULAR | Status: DC | PRN
  Administered 2012-02-09 (×4): 0.5 mg via INTRAVENOUS

## 2012-02-09 MED ORDER — METOCLOPRAMIDE HCL 5 MG/ML IJ SOLN: 5.0000 mg | Freq: Three times a day (TID) | INTRAMUSCULAR | Status: DC | PRN

## 2012-02-09 MED ORDER — HYDROMORPHONE HCL PF 1 MG/ML IJ SOLN
0.5000 mg | INTRAMUSCULAR | Status: DC | PRN
  Administered 2012-02-09 – 2012-02-10 (×2): 1 mg via INTRAVENOUS
  Filled 2012-02-09 (×2): qty 1

## 2012-02-09 MED ORDER — OXYCODONE HCL 5 MG PO TABS: 5.0000 mg | ORAL_TABLET | Freq: Once | ORAL | Status: DC | PRN

## 2012-02-09 MED ORDER — NEOSTIGMINE METHYLSULFATE 1 MG/ML IJ SOLN
INTRAMUSCULAR | Status: DC | PRN
  Administered 2012-02-09: 4 mg via INTRAVENOUS

## 2012-02-09 MED ORDER — ONDANSETRON HCL 4 MG/2ML IJ SOLN
INTRAMUSCULAR | Status: DC | PRN
  Administered 2012-02-09: 4 mg via INTRAVENOUS

## 2012-02-09 MED ORDER — METOCLOPRAMIDE HCL 5 MG/ML IJ SOLN: 10.0000 mg | Freq: Once | INTRAMUSCULAR | Status: DC | PRN

## 2012-02-09 MED ORDER — CEFAZOLIN SODIUM-DEXTROSE 2-3 GM-% IV SOLR
2.0000 g | Freq: Four times a day (QID) | INTRAVENOUS | Status: AC
  Administered 2012-02-09 (×2): 2 g via INTRAVENOUS
  Filled 2012-02-09 (×2): qty 50

## 2012-02-09 MED ORDER — GLYCOPYRROLATE 0.2 MG/ML IJ SOLN
INTRAMUSCULAR | Status: DC | PRN
  Administered 2012-02-09: 0.6 mg via INTRAVENOUS

## 2012-02-09 MED ORDER — ASPIRIN 325 MG PO TABS
325.0000 mg | ORAL_TABLET | Freq: Every day | ORAL | Status: DC
  Administered 2012-02-09 – 2012-02-10 (×2): 325 mg via ORAL
  Filled 2012-02-09 (×2): qty 1

## 2012-02-09 MED ORDER — POTASSIUM CHLORIDE IN NACL 20-0.9 MEQ/L-% IV SOLN
INTRAVENOUS | Status: DC
  Administered 2012-02-09 (×2): via INTRAVENOUS
  Filled 2012-02-09 (×4): qty 1000

## 2012-02-09 MED ORDER — FENTANYL CITRATE 0.05 MG/ML IJ SOLN
INTRAMUSCULAR | Status: DC | PRN
  Administered 2012-02-09: 100 ug via INTRAVENOUS
  Administered 2012-02-09 (×3): 50 ug via INTRAVENOUS

## 2012-02-09 MED ORDER — ADULT MULTIVITAMIN W/MINERALS CH
1.0000 | ORAL_TABLET | Freq: Every day | ORAL | Status: DC
Start: 2012-02-09 — End: 2012-02-10
  Administered 2012-02-09 – 2012-02-10 (×2): 1 via ORAL
  Filled 2012-02-09 (×2): qty 1

## 2012-02-09 MED ORDER — DOCUSATE SODIUM 100 MG PO CAPS
100.0000 mg | ORAL_CAPSULE | Freq: Two times a day (BID) | ORAL | Status: DC
  Administered 2012-02-09 – 2012-02-10 (×3): 100 mg via ORAL
  Filled 2012-02-09 (×3): qty 1

## 2012-02-09 MED ORDER — ROPIVACAINE HCL 5 MG/ML IJ SOLN: INTRAMUSCULAR | Status: DC | PRN

## 2012-02-09 MED ORDER — INFLUENZA VIRUS VACC SPLIT PF IM SUSP
0.5000 mL | INTRAMUSCULAR | Status: AC
  Administered 2012-02-10: 0.5 mL via INTRAMUSCULAR
  Filled 2012-02-09: qty 0.5

## 2012-02-09 MED ORDER — ACETAMINOPHEN 650 MG RE SUPP: 650.0000 mg | Freq: Four times a day (QID) | RECTAL | Status: DC | PRN

## 2012-02-09 MED ORDER — ARTIFICIAL TEARS OP OINT
TOPICAL_OINTMENT | OPHTHALMIC | Status: DC | PRN
  Administered 2012-02-09: 1 via OPHTHALMIC

## 2012-02-09 MED ORDER — CEFUROXIME SODIUM 1.5 G IJ SOLR
INTRAMUSCULAR | Status: DC | PRN
  Administered 2012-02-09: 1.5 g

## 2012-02-09 MED ORDER — LOSARTAN POTASSIUM 50 MG PO TABS
50.0000 mg | ORAL_TABLET | Freq: Every day | ORAL | Status: DC
  Administered 2012-02-09 – 2012-02-10 (×2): 50 mg via ORAL
  Filled 2012-02-09 (×2): qty 1

## 2012-02-09 MED ORDER — DIPYRIDAMOLE 25 MG PO TABS
25.0000 mg | ORAL_TABLET | Freq: Every day | ORAL | Status: DC
  Administered 2012-02-09 – 2012-02-10 (×2): 25 mg via ORAL
  Filled 2012-02-09 (×2): qty 1

## 2012-02-09 MED ORDER — LIDOCAINE HCL 1 % IJ SOLN
INTRAMUSCULAR | Status: DC | PRN
  Administered 2012-02-09: 2 mL via INTRADERMAL

## 2012-02-09 MED ORDER — BUPIVACAINE-EPINEPHRINE PF 0.25-1:200000 % IJ SOLN
INTRAMUSCULAR | Status: AC
  Filled 2012-02-09: qty 30

## 2012-02-09 MED ORDER — DIPHENHYDRAMINE HCL 12.5 MG/5ML PO ELIX: 12.5000 mg | ORAL_SOLUTION | ORAL | Status: DC | PRN

## 2012-02-09 MED ORDER — DEXAMETHASONE 6 MG PO TABS
10.0000 mg | ORAL_TABLET | Freq: Every day | ORAL | Status: DC
  Administered 2012-02-10: 10 mg via ORAL
  Filled 2012-02-09 (×2): qty 1

## 2012-02-09 MED ORDER — POVIDONE-IODINE 7.5 % EX SOLN: Freq: Once | CUTANEOUS | Status: DC

## 2012-02-09 MED ORDER — LACTATED RINGERS IV SOLN: INTRAVENOUS | Status: DC

## 2012-02-09 MED ORDER — BISACODYL 5 MG PO TBEC
10.0000 mg | DELAYED_RELEASE_TABLET | Freq: Every day | ORAL | Status: DC
  Administered 2012-02-09: 10 mg via ORAL
  Filled 2012-02-09: qty 2

## 2012-02-09 MED ORDER — ONDANSETRON HCL 4 MG/2ML IJ SOLN
4.0000 mg | Freq: Four times a day (QID) | INTRAMUSCULAR | Status: DC | PRN
  Administered 2012-02-09: 4 mg via INTRAVENOUS
  Filled 2012-02-09: qty 2

## 2012-02-09 MED ORDER — ONDANSETRON HCL 4 MG PO TABS: 4.0000 mg | ORAL_TABLET | Freq: Four times a day (QID) | ORAL | Status: DC | PRN

## 2012-02-09 MED ORDER — DEXAMETHASONE SODIUM PHOSPHATE 10 MG/ML IJ SOLN
INTRAMUSCULAR | Status: DC | PRN
  Administered 2012-02-09: 10 mg via INTRAVENOUS

## 2012-02-09 MED ORDER — ROPIVACAINE HCL 5 MG/ML IJ SOLN
INTRAMUSCULAR | Status: DC | PRN
  Administered 2012-02-09: 25 mL

## 2012-02-09 MED ORDER — ACETAMINOPHEN 10 MG/ML IV SOLN
1000.0000 mg | Freq: Four times a day (QID) | INTRAVENOUS | Status: AC
  Administered 2012-02-09 – 2012-02-10 (×4): 1000 mg via INTRAVENOUS
  Filled 2012-02-09 (×4): qty 100

## 2012-02-09 MED ORDER — CHLORHEXIDINE GLUCONATE 4 % EX LIQD: 60.0000 mL | Freq: Once | CUTANEOUS | Status: DC

## 2012-02-09 MED ORDER — OXYCODONE HCL 5 MG PO TABS
5.0000 mg | ORAL_TABLET | ORAL | Status: DC | PRN
  Administered 2012-02-09 – 2012-02-10 (×3): 10 mg via ORAL
  Filled 2012-02-09 (×3): qty 2

## 2012-02-09 MED ORDER — DEXAMETHASONE SODIUM PHOSPHATE 10 MG/ML IJ SOLN
10.0000 mg | Freq: Every day | INTRAMUSCULAR | Status: DC
  Administered 2012-02-09: 10 mg via INTRAVENOUS
  Filled 2012-02-09 (×2): qty 1

## 2012-02-09 MED ORDER — ACETAMINOPHEN 325 MG PO TABS: 650.0000 mg | ORAL_TABLET | Freq: Four times a day (QID) | ORAL | Status: DC | PRN

## 2012-02-09 MED ORDER — BUPIVACAINE-EPINEPHRINE 0.25% -1:200000 IJ SOLN
INTRAMUSCULAR | Status: DC | PRN
  Administered 2012-02-09: 30 mL

## 2012-02-09 MED ORDER — MIDAZOLAM HCL 5 MG/5ML IJ SOLN
INTRAMUSCULAR | Status: DC | PRN
  Administered 2012-02-09: 2 mg via INTRAVENOUS

## 2012-02-09 MED ORDER — CENTRUM PO CHEW: 1.0000 | CHEWABLE_TABLET | Freq: Every day | ORAL | Status: DC

## 2012-02-09 MED ORDER — ROCURONIUM BROMIDE 100 MG/10ML IV SOLN
INTRAVENOUS | Status: DC | PRN
  Administered 2012-02-09: 50 mg via INTRAVENOUS

## 2012-02-09 MED ORDER — ATORVASTATIN CALCIUM 40 MG PO TABS
40.0000 mg | ORAL_TABLET | Freq: Every day | ORAL | Status: DC
  Administered 2012-02-09: 40 mg via ORAL
  Filled 2012-02-09 (×2): qty 1

## 2012-02-09 MED ORDER — LACTATED RINGERS IV SOLN
INTRAVENOUS | Status: DC | PRN
  Administered 2012-02-09 (×2): via INTRAVENOUS

## 2012-02-09 MED ORDER — NIACIN 500 MG PO TABS
500.0000 mg | ORAL_TABLET | Freq: Every day | ORAL | Status: DC
  Administered 2012-02-10: 500 mg via ORAL
  Filled 2012-02-09 (×2): qty 1

## 2012-02-09 MED ORDER — ALUM & MAG HYDROXIDE-SIMETH 200-200-20 MG/5ML PO SUSP: 30.0000 mL | ORAL | Status: DC | PRN

## 2012-02-09 MED ORDER — SODIUM CHLORIDE 0.9 % IR SOLN
Status: DC | PRN
  Administered 2012-02-09: 3000 mL

## 2012-02-09 MED ORDER — OXYCODONE HCL 5 MG/5ML PO SOLN: 5.0000 mg | Freq: Once | ORAL | Status: DC | PRN

## 2012-02-09 MED ORDER — PHENOL 1.4 % MT LIQD: 1.0000 | OROMUCOSAL | Status: DC | PRN

## 2012-02-09 MED ORDER — MENTHOL 3 MG MT LOZG: 1.0000 | LOZENGE | OROMUCOSAL | Status: DC | PRN

## 2012-02-09 MED ORDER — CEFUROXIME SODIUM 1.5 G IJ SOLR
INTRAMUSCULAR | Status: AC
  Filled 2012-02-09: qty 1.5

## 2012-02-09 SURGICAL SUPPLY — 67 items
BANDAGE ESMARK 6X9 LF (GAUZE/BANDAGES/DRESSINGS) ×1 IMPLANT
BLADE SAGITTAL 25.0X1.19X90 (BLADE) ×2 IMPLANT
BLADE SAW SGTL 11.0X1.19X90.0M (BLADE) IMPLANT
BLADE SAW SGTL 13.0X1.19X90.0M (BLADE) ×2 IMPLANT
BLADE SURG 10 STRL SS (BLADE) ×4 IMPLANT
BNDG ELASTIC 6X15 VLCR STRL LF (GAUZE/BANDAGES/DRESSINGS) ×2 IMPLANT
BNDG ESMARK 6X9 LF (GAUZE/BANDAGES/DRESSINGS) ×2
BOWL SMART MIX CTS (DISPOSABLE) ×2 IMPLANT
CEMENT HV SMART SET (Cement) ×4 IMPLANT
CLOTH BEACON ORANGE TIMEOUT ST (SAFETY) ×2 IMPLANT
COVER BACK TABLE 24X17X13 BIG (DRAPES) IMPLANT
COVER PROBE W GEL 5X96 (DRAPES) IMPLANT
COVER SURGICAL LIGHT HANDLE (MISCELLANEOUS) ×2 IMPLANT
CUFF TOURNIQUET SINGLE 34IN LL (TOURNIQUET CUFF) ×2 IMPLANT
CUFF TOURNIQUET SINGLE 44IN (TOURNIQUET CUFF) IMPLANT
DRAPE EXTREMITY T 121X128X90 (DRAPE) ×2 IMPLANT
DRAPE INCISE IOBAN 66X45 STRL (DRAPES) IMPLANT
DRAPE PROXIMA HALF (DRAPES) ×4 IMPLANT
DRAPE U-SHAPE 47X51 STRL (DRAPES) ×2 IMPLANT
DRSG ADAPTIC 3X8 NADH LF (GAUZE/BANDAGES/DRESSINGS) ×2 IMPLANT
DRSG PAD ABDOMINAL 8X10 ST (GAUZE/BANDAGES/DRESSINGS) ×4 IMPLANT
DURAPREP 26ML APPLICATOR (WOUND CARE) ×2 IMPLANT
ELECT CAUTERY BLADE 6.4 (BLADE) ×2 IMPLANT
ELECT REM PT RETURN 9FT ADLT (ELECTROSURGICAL) ×2
ELECTRODE REM PT RTRN 9FT ADLT (ELECTROSURGICAL) ×1 IMPLANT
EVACUATOR 1/8 PVC DRAIN (DRAIN) ×2 IMPLANT
FACESHIELD LNG OPTICON STERILE (SAFETY) ×2 IMPLANT
GLOVE BIO SURGEON STRL SZ7 (GLOVE) ×2 IMPLANT
GLOVE BIOGEL PI IND STRL 7.0 (GLOVE) ×1 IMPLANT
GLOVE BIOGEL PI IND STRL 7.5 (GLOVE) ×1 IMPLANT
GLOVE BIOGEL PI INDICATOR 7.0 (GLOVE) ×1
GLOVE BIOGEL PI INDICATOR 7.5 (GLOVE) ×1
GLOVE SS BIOGEL STRL SZ 7.5 (GLOVE) ×1 IMPLANT
GLOVE SUPERSENSE BIOGEL SZ 7.5 (GLOVE) ×1
GOWN PREVENTION PLUS XLARGE (GOWN DISPOSABLE) ×4 IMPLANT
GOWN STRL NON-REIN LRG LVL3 (GOWN DISPOSABLE) ×4 IMPLANT
HANDPIECE INTERPULSE COAX TIP (DISPOSABLE) ×1
HOOD PEEL AWAY FACE SHEILD DIS (HOOD) ×4 IMPLANT
IMMOBILIZER KNEE 22 UNIV (SOFTGOODS) ×2 IMPLANT
INSERT CUSHION PRONEVIEW LG (MISCELLANEOUS) ×2 IMPLANT
KIT BASIN OR (CUSTOM PROCEDURE TRAY) ×2 IMPLANT
KIT ROOM TURNOVER OR (KITS) ×2 IMPLANT
MANIFOLD NEPTUNE II (INSTRUMENTS) ×2 IMPLANT
NS IRRIG 1000ML POUR BTL (IV SOLUTION) ×2 IMPLANT
PACK TOTAL JOINT (CUSTOM PROCEDURE TRAY) ×2 IMPLANT
PAD ARMBOARD 7.5X6 YLW CONV (MISCELLANEOUS) ×4 IMPLANT
PAD CAST 4YDX4 CTTN HI CHSV (CAST SUPPLIES) ×1 IMPLANT
PADDING CAST COTTON 4X4 STRL (CAST SUPPLIES) ×1
PADDING CAST COTTON 6X4 STRL (CAST SUPPLIES) ×2 IMPLANT
POSITIONER HEAD PRONE TRACH (MISCELLANEOUS) ×2 IMPLANT
RUBBERBAND STERILE (MISCELLANEOUS) ×2 IMPLANT
SET HNDPC FAN SPRY TIP SCT (DISPOSABLE) ×1 IMPLANT
SPONGE GAUZE 4X4 12PLY (GAUZE/BANDAGES/DRESSINGS) ×2 IMPLANT
STRIP CLOSURE SKIN 1/2X4 (GAUZE/BANDAGES/DRESSINGS) ×2 IMPLANT
SUCTION FRAZIER TIP 10 FR DISP (SUCTIONS) ×2 IMPLANT
SUT ETHIBOND NAB CT1 #1 30IN (SUTURE) ×4 IMPLANT
SUT MNCRL AB 3-0 PS2 18 (SUTURE) ×2 IMPLANT
SUT VIC AB 0 CT1 27 (SUTURE) ×2
SUT VIC AB 0 CT1 27XBRD ANBCTR (SUTURE) ×2 IMPLANT
SUT VIC AB 2-0 CT1 27 (SUTURE) ×2
SUT VIC AB 2-0 CT1 TAPERPNT 27 (SUTURE) ×2 IMPLANT
SUT VLOC 180 0 24IN GS25 (SUTURE) IMPLANT
SYR 30ML SLIP (SYRINGE) ×2 IMPLANT
TOWEL OR 17X24 6PK STRL BLUE (TOWEL DISPOSABLE) ×2 IMPLANT
TOWEL OR 17X26 10 PK STRL BLUE (TOWEL DISPOSABLE) ×2 IMPLANT
TRAY FOLEY CATH 14FR (SET/KITS/TRAYS/PACK) ×2 IMPLANT
WATER STERILE IRR 1000ML POUR (IV SOLUTION) ×4 IMPLANT

## 2012-02-09 NOTE — Anesthesia Postprocedure Evaluation (Signed)
Anesthesia Post Note  Patient: Caleb Barton  Procedure(s) Performed: Procedure(s) (LRB): TOTAL KNEE ARTHROPLASTY (Right)  Anesthesia type: general  Patient location: PACU  Post pain: Pain level controlled  Post assessment: Patient's Cardiovascular Status Stable  Last Vitals:  Filed Vitals:   02/09/12 0925  BP: 126/68  Pulse:   Temp: 36.4 C  Resp:     Post vital signs: Reviewed and stable  Level of consciousness: sedated  Complications: No apparent anesthesia complications

## 2012-02-09 NOTE — Preoperative (Signed)
Beta Blockers   Reason not to administer Beta Blockers:Not Applicable 

## 2012-02-09 NOTE — Progress Notes (Signed)
Orthopedic Tech Progress Note Patient Details:  Caleb Barton 1940-06-29 [id number]  Caleb Barton Caleb Barton: On Right Barton Flexion (Degrees): 60  Right Barton Extension (Degrees): 0  Additional Comments: trapeze bar   Cammer, Mickie Bail 02/09/2012, 9:48 AM

## 2012-02-09 NOTE — Interval H&P Note (Signed)
History and Physical Interval Note:  02/09/2012 7:05 AM  Caleb Barton  has presented today for surgery, with the diagnosis of DJD RIGHT KNEE  The various methods of treatment have been discussed with the patient and family. After consideration of risks, benefits and other options for treatment, the patient has consented to  Procedure(s) (LRB) with comments: TOTAL KNEE ARTHROPLASTY (Right) as a surgical intervention .  The patient's history has been reviewed, patient examined, no change in status, stable for surgery.  I have reviewed the patient's chart and labs.  Questions were answered to the patient's satisfaction.     Salvatore Marvel A

## 2012-02-09 NOTE — Progress Notes (Signed)
UR COMPLETED  

## 2012-02-09 NOTE — Transfer of Care (Signed)
Immediate Anesthesia Transfer of Care Note  Patient: Caleb Barton  Procedure(s) Performed: Procedure(s) (LRB) with comments: TOTAL KNEE ARTHROPLASTY (Right)  Patient Location: PACU  Anesthesia Type: General  Level of Consciousness: awake, alert  and oriented  Airway & Oxygen Therapy: Patient Spontanous Breathing and Patient connected to nasal cannula oxygen  Post-op Assessment: Report given to PACU RN  Post vital signs: Reviewed and stable  Complications: No apparent anesthesia complications

## 2012-02-09 NOTE — Plan of Care (Signed)
Problem: Consults Goal: Diagnosis- Total Joint Replacement Primary Total Knee     

## 2012-02-09 NOTE — Anesthesia Preprocedure Evaluation (Addendum)
Anesthesia Evaluation  Patient identified by MRN, date of birth, ID band Patient awake    Reviewed: Allergy & Precautions, H&P , NPO status , Patient's Chart, lab work & pertinent test results, reviewed documented beta blocker date and time   Airway Mallampati: III TM Distance: >3 FB Neck ROM: full    Dental  (+) Teeth Intact   Pulmonary neg pulmonary ROS, sleep apnea and Continuous Positive Airway Pressure Ventilation ,  breath sounds clear to auscultation        Cardiovascular hypertension, + dysrhythmias Rhythm:regular     Neuro/Psych TIAResidual Symptoms negative neurological ROS  negative psych ROS   GI/Hepatic negative GI ROS, Neg liver ROS,   Endo/Other  negative endocrine ROS  Renal/GU negative Renal ROS  negative genitourinary   Musculoskeletal   Abdominal   Peds  Hematology negative hematology ROS (+)   Anesthesia Other Findings See surgeon's H&P   Reproductive/Obstetrics negative OB ROS                          Anesthesia Physical Anesthesia Plan  ASA: III  Anesthesia Plan: General   Post-op Pain Management:    Induction: Intravenous  Airway Management Planned: Oral ETT  Additional Equipment:   Intra-op Plan:   Post-operative Plan: Extubation in OR  Informed Consent: I have reviewed the patients History and Physical, chart, labs and discussed the procedure including the risks, benefits and alternatives for the proposed anesthesia with the patient or authorized representative who has indicated his/her understanding and acceptance.   Dental Advisory Given  Plan Discussed with: CRNA and Surgeon  Anesthesia Plan Comments:         Anesthesia Quick Evaluation

## 2012-02-09 NOTE — Op Note (Signed)
MRN:     [id number] DOB/AGE:    [birth year] / 71 y.o. y.o.       OPERATIVE REPORT    DATE OF PROCEDURE:  02/09/2012       PREOPERATIVE DIAGNOSIS:   DJD RIGHT KNEE      Estimated Body mass index is 33.23 kg/(m^2) as calculated from the following:   Height as of 08/20/11: 5\' 9" (1.753 m).   Weight as of 08/20/11: 225 lb(102.059 kg).                                                        POSTOPERATIVE DIAGNOSIS:   degenerative joint disease right knee                                                                      PROCEDURE:  Procedure(s): TOTAL KNEE ARTHROPLASTY Using Depuy Sigma RP implants #3 Femur, #4Tibia, 15mm sigma RP bearing, 35 Patella     SURGEON: Roxsana Riding A    ASSISTANT:  Kirstin Shepperson PA-C   (Present and scrubbed throughout the case, critical for assistance with exposure, retraction, instrumentation, and closure.)         ANESTHESIA: GET with Femoral Nerve Block  DRAINS: foley, 2 medium hemovac in knee   TOURNIQUET TIME:   COMPLICATIONS:  None     SPECIMENS: None   INDICATIONS FOR PROCEDURE: The patient has  DJD RIGHT KNEE, varus deformities, XR shows bone on bone arthritis. Patient has failed all conservative measures including anti-inflammatory medicines, narcotics, attempts at  exercise and weight loss, cortisone injections and viscosupplementation.  Risks and benefits of surgery have been discussed, questions answered.   DESCRIPTION OF PROCEDURE: The patient identified by armband, received  right femoral nerve block and IV antibiotics, in the holding area at [facility]. Patient taken to the operating room, appropriate anesthetic  monitors were attached General endotracheal anesthesia induced with  the patient in supine position, Foley catheter was inserted. Tourniquet  applied high to the operative thigh. Lateral post and foot positioner  applied to the table, the lower extremity was then prepped and draped  in usual sterile fashion from the ankle  to the tourniquet. Time-out procedure was performed. The limb was wrapped with an Esmarch bandage and the tourniquet inflated to 365 mmHg. We began the operation by making the anterior midline incision starting at handbreadth above the patella going over the patella 1 cm medial to and  4 cm distal to the tibial tubercle. Small bleeders in the skin and the  subcutaneous tissue identified and cauterized. Transverse retinaculum was incised and reflected medially and a medial parapatellar arthrotomy was accomplished. the patella was everted and theprepatellar fat pad resected. The superficial medial collateral  ligament was then elevated from anterior to posterior along the proximal  flare of the tibia and anterior half of the menisci resected. The knee was hyperflexed exposing bone on bone arthritis. Peripheral and notch osteophytes as well as the cruciate ligaments were then resected. We continued to  work our way around posteriorly along the proximal tibia, and externally  rotated  the tibia subluxing it out from underneath the femur. A McHale  retractor was placed through the notch and a lateral Hohmann retractor  placed, and we then drilled through the proximal tibia in line with the  axis of the tibia followed by an intramedullary guide rod and 2-degree  posterior slope cutting guide. The tibial cutting guide was pinned into place  allowing resection of 4 mm of bone medially and about 7 mm of bone  laterally because of her varus deformity. Satisfied with the tibial resection, we then  entered the distal femur 2 mm anterior to the PCL origin with the  intramedullary guide rod and applied the distal femoral cutting guide  set at 11mm, with 5 degrees of valgus. This was pinned along the  epicondylar axis. At this point, the distal femoral cut was accomplished without difficulty. We then sized for a #3 femoral component and pinned the guide in 3 degrees of external rotation.The chamfer cutting guide was  pinned into place. The anterior, posterior, and chamfer cuts were accomplished without difficulty followed by  the Sigma RP box cutting guide and the box cut. We also removed posterior osteophytes from the posterior femoral condyles. At this  time, the knee was brought into full extension. We checked our  extension and flexion gaps and found them symmetric at 15mm.  The patella thickness measured at 25 mm. We set the cutting guide at 15 and removed the posterior 9.5-10 mm  of the patella sized for 35 button and drilled the lollipop. The knee  was then once again hyperflexed exposing the proximal tibia. We sized for a #4 tibial base plate, applied the smokestack and the conical reamer followed by the the Delta fin keel punch. We then hammered into place the Sigma RP trial femoral component, inserted a 15-mm trial bearing, trial patellar button, and took the knee through range of motion from 0-130 degrees. No thumb pressure was required for patellar  tracking. At this point, all trial components were removed, a double batch of DePuy HV cement with 1500 mg of Zinacef was mixed and applied to all bony metallic mating surfaces except for the posterior condyles of the femur itself. In order, we  hammered into place the tibial tray and removed excess cement, the femoral component and removed excess cement, a 15-mm Sigma RP bearing  was inserted, and the knee brought to full extension with compression.  The patellar button was clamped into place, and excess cement  removed. While the cement cured the wound was irrigated out with normal saline solution pulse lavage, and medium Hemovac drains were placed.. Ligament stability and patellar tracking were checked and found to be excellent. The tourniquet was then released and hemostasis was obtained with cautery. The parapatellar arthrotomy was closed with  #1 ethibond suture. The subcutaneous tissue with 0 and 2-0 undyed  Vicryl suture, and 4-0 Monocryl.. A dressing  of Xeroform,  4 x 4, dressing sponges, Webril, and Ace wrap applied. Needle and sponge count were correct times 2.The patient awakened, extubated, and taken to recovery room without difficulty. Vascular status was normal, pulses 2+ and symmetric.   Love Chowning A 02/09/2012, 8:56 AM

## 2012-02-09 NOTE — Anesthesia Postprocedure Evaluation (Signed)
See surgeon's H&P  

## 2012-02-09 NOTE — H&P (View-Only) (Signed)
TOTAL KNEE ADMISSION H&P  Patient is being admitted for right total knee arthroplasty.  Subjective:  Chief Complaint:right knee pain.  HPI: Caleb Barton, 71 y.o. male, has a history of pain and functional disability in the right knee due to arthritis and has failed non-surgical conservative treatments for greater than 12 weeks to includeNSAID's and/or analgesics, corticosteriod injections, flexibility and strengthening excercises, weight reduction as appropriate and activity modification.  Onset of symptoms was gradual, starting 5 years ago with gradually worsening course since that time. The patient noted prior procedures on the knee to include  arthroscopy and menisectomy on the right knee(s).  Patient currently rates pain in the right knee(s) at 10 out of 10 with activity. Patient has night pain, worsening of pain with activity and weight bearing, pain that interferes with activities of daily living, pain with passive range of motion, crepitus and joint swelling.  Patient has evidence of subchondral sclerosis, periarticular osteophytes and joint space narrowing by imaging studies.  There is no active infection.  Patient Active Problem List   Diagnosis Date Noted  . Hypertension   . Dysrhythmia   . Stroke   . Sleep apnea   . Right knee DJD    Past Medical History  Diagnosis Date  . Hypertension   . Dysrhythmia     RBBB  . Stroke     TIA 15 yrs ago  . Sleep apnea     on CPAP nightly  . Right knee DJD     Past Surgical History  Procedure Date  . Tonsillectomy   . Colonoscopy     with polyps  . Colonoscopy 08/20/2011    Procedure: COLONOSCOPY;  Surgeon: James L Edwards Jr., MD;  Location: [facility];  Service: Endoscopy;  Laterality: N/A;     (Not in a hospital admission) Allergies  Allergen Reactions  . Sulfa Antibiotics Rash    History  Substance Use Topics  . Smoking status: Never Smoker   . Smokeless tobacco: Not on file  . Alcohol Use: Yes     social    Family  History  Problem Relation Age of Onset  . Stroke Mother      Review of Systems  Constitutional: Negative.   HENT: Negative.   Eyes: Negative.   Respiratory: Negative.   Cardiovascular: Negative.   Gastrointestinal: Negative.   Genitourinary: Negative.   Musculoskeletal: Positive for joint pain.       Right knee  Skin: Negative.   Neurological: Negative.   Endo/Heme/Allergies: Bruises/bleeds easily.       On aspirin and persantine for history of TIA  Psychiatric/Behavioral: Negative.     Objective:  Physical Exam  Constitutional: He is oriented to person, place, and time. He appears well-developed and well-nourished.  HENT:  Head: Normocephalic and atraumatic.  Mouth/Throat: Oropharynx is clear and moist.  Eyes: Conjunctivae normal and EOM are normal. Pupils are equal, round, and reactive to light.  Neck: Neck supple.  Cardiovascular: Normal rate and regular rhythm.   Respiratory: Effort normal and breath sounds normal.  GI: Soft. Bowel sounds are normal.  Genitourinary:       Not pertinent to current symptomatology therefore not examined.  Musculoskeletal:       He is independently ambulatory with a moderately antalgic gait.  Right knee has active range of motion -5 to 115 degrees.  Normal patella tracking.  Varus deformity.  2+ crepitus.  1+ effusion.  Distal neurovascular exam is intact.  Left knee has active range of   motion 0-120 degrees.  1+ crepitus.  1+ synovitis.  Mild varus deformity.  Normal patella tracking.  He has a significant antalgic gait due to limping because of his right knee pain.  He has 2+ dorsalis pedis pulses.    Neurological: He is alert and oriented to person, place, and time.  Skin: Skin is warm and dry.  Psychiatric: He has a normal mood and affect. His behavior is normal. Judgment and thought content normal.    Vital signs in last 24 hours: Ht 5"9 Wt 228 lbs BP 155/86 Pulse 58 o2 sat 97% Temp 97.7  Labs:   Estimated Body mass index is  33.23 kg/(m^2) as calculated from the following:   Height as of 08/20/11: 5' 9"(1.753 m).   Weight as of 08/20/11: 225 lb(102.059 kg).   Imaging Review Plain radiographs demonstrate severe degenerative joint disease of the bilaterally knee(s). The overall alignment issignificant varus. The bone quality appears to be good for age and reported activity level. X-rays are reviewed from 07/25/11 and show significant periarticular spurring with no remaining joint space medially and in the patellofemoral joint with bone on bone arthritis and varus deformity.  Assessment/Plan:  End stage arthritis, right knee Patient Active Problem List  Diagnosis  . Hypertension  . Dysrhythmia  . Stroke  . Sleep apnea  . Right knee DJD      The patient history, physical examination, clinical judgment of the provider and imaging studies are consistent with end stage degenerative joint disease of the right knee(s) and total knee arthroplasty is deemed medically necessary. The treatment options including medical management, injection therapy arthroscopy and arthroplasty were discussed at length. The risks and benefits of total knee arthroplasty were presented and reviewed. The risks due to aseptic loosening, infection, stiffness, patella tracking problems, thromboembolic complications and other imponderables were discussed. The patient acknowledged the explanation, agreed to proceed with the plan and consent was signed. Patient is being admitted for inpatient treatment for surgery, pain control, PT, OT, prophylactic antibiotics, VTE prophylaxis, progressive ambulation and ADL's and discharge planning. The patient is planning to be discharged home with home health services  Malachai Schalk A. Elyce Zollinger, PA-C Physician Assistant [facility] [phone]  02/03/2012, 9:27 AM  

## 2012-02-09 NOTE — Evaluation (Signed)
Physical Therapy Evaluation Patient Details Name: Caleb Barton MRN: [id number] DOB: Oct 21, 1940 Today's Date: 02/09/2012 Time: 1800-1850 PT Time Calculation (min): 50 min  PT Assessment / Plan / Recommendation Clinical Impression  Pt is a 71 y/o male s/p R TKA.  Pt performed well with PT and should be an excellent candidate to D/c home on POD#1.  Acute PT to follow pt to maximize functional mobility.      PT Assessment  Patient needs continued PT services    Follow Up Recommendations  Home health PT;Supervision - Intermittent    Does the patient have the potential to tolerate intense rehabilitation      Barriers to Discharge None      Equipment Recommendations  None recommended by PT    Recommendations for Other Services     Frequency 7X/week    Precautions / Restrictions Precautions Precautions: Knee Precaution Comments: Educated pt on use of KI, positioning of R knee to promote knee extension, and Use of CPM.   Required Braces or Orthoses: Knee Immobilizer - Right Knee Immobilizer - Right: On when out of bed or walking Restrictions Weight Bearing Restrictions: Yes RLE Weight Bearing: Weight bearing as tolerated   Pertinent Vitals/Pain Pt reporting R knee pain 1-3/10.  RN medicated pt at start of PT session.       Mobility  Bed Mobility Bed Mobility: Supine to Sit;Sitting - Scoot to Edge of Bed Supine to Sit: 4: Min assist;HOB flat Sitting - Scoot to [place] of Bed: 4: Min assist Details for Bed Mobility Assistance: Min assist for R LE to minimize pain.   Transfers Transfers: Sit to Stand;Stand to Sit Sit to Stand: 4: Min guard;With upper extremity assist;From bed Stand to Sit: 4: Min guard;With upper extremity assist;To chair/3-in-1 Details for Transfer Assistance: Cues for technique and and hand placement.   Ambulation/Gait Ambulation/Gait Assistance: 5: Supervision Ambulation Distance (Feet): 30 Feet Assistive device: Rolling walker Ambulation/Gait Assistance  Details: Cues for sequencing and WBAT on R LE.  Pt reporting putting all of his weight on his R LE with no increase in pain.   Gait Pattern: Step-to pattern;Decreased stride length Stairs: No    Shoulder Instructions     Exercises Total Joint Exercises Ankle Circles/Pumps: AROM;Both;10 reps;Seated   PT Diagnosis: Abnormality of gait;Acute pain;Generalized weakness  PT Problem List: Decreased strength;Decreased range of motion;Decreased mobility;Decreased knowledge of use of DME;Decreased knowledge of precautions;Obesity;Pain PT Treatment Interventions: DME instruction;Gait training;Stair training;Functional mobility training;Therapeutic exercise;Therapeutic activities;Patient/family education   PT Goals Acute Rehab PT Goals PT Goal Formulation: With patient/family Time For Goal Achievement: 02/16/12 Potential to Achieve Goals: Good Pt will go Supine/Side to Sit: Independently;with HOB 0 degrees PT Goal: Supine/Side to Sit - Progress: Goal set today Pt will go Sit to Supine/Side: Independently;with HOB 0 degrees PT Goal: Sit to Supine/Side - Progress: Goal set today Pt will Transfer Bed to Chair/Chair to Bed: with modified independence PT Transfer Goal: Bed to Chair/Chair to Bed - Progress: Goal set today Pt will Ambulate: >150 feet;with modified independence;with rolling walker PT Goal: Ambulate - Progress: Goal set today Pt will Go Up / Down Stairs: Flight;with supervision;with rail(s);with crutches (1 rail and 1 crutch.  ) PT Goal: Up/Down Stairs - Progress: Goal set today Pt will Perform Home Exercise Program: with supervision, verbal cues required/provided PT Goal: Perform Home Exercise Program - Progress: Goal set today  Visit Information  Last PT Received On: 02/09/12 Assistance Needed: +1    Subjective Data  Subjective: agree to PT  eval. Patient Stated Goal: Be able to play golf and go fishing.    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 Communication: No difficulties    Cognition  Overall Cognitive Status: Appears within functional limits for tasks assessed/performed Arousal/Alertness: Awake/alert Orientation Level: Appears intact for tasks assessed Behavior During Session: [facility] for tasks performed    Extremity/Trunk Assessment Right Upper Extremity Assessment RUE ROM/Strength/Tone: Within functional levels Left Upper Extremity Assessment LUE ROM/Strength/Tone: Within functional levels Right Lower Extremity Assessment RLE ROM/Strength/Tone: Within functional levels;Deficits;Due to pain RLE ROM/Strength/Tone Deficits: Weakness and ROM limitation secondary to surgery.   Left Lower Extremity Assessment LLE ROM/Strength/Tone: Within functional levels Trunk Assessment Trunk Assessment: Normal   Balance    End of Session PT - End of Session Equipment Utilized During Treatment: Gait belt;Right lower extremity prosthesis Activity Tolerance: Patient tolerated treatment well Patient left: in chair;with call bell/phone within reach;with family/visitor present Nurse Communication: Mobility status CPM Right Knee CPM Right Knee: Off Additional Comments: CPM removed at 6 pm.    GP     Zarin Knupp 02/09/2012, 7:42 PM  Nneka Blanda L. Namiah Dunnavant DPT [phone]

## 2012-02-09 NOTE — Anesthesia Procedure Notes (Signed)
Anesthesia Regional Block:  Femoral nerve block  Pre-Anesthetic Checklist: ,, timeout performed, Correct Patient, Correct Site, Correct Laterality, Correct Procedure, Correct Position, site marked, Risks and benefits discussed,  Surgical consent,  Pre-op evaluation,  At surgeon's request and post-op pain management  Laterality: Right  Prep: chloraprep       Needles:   Needle Type: Other     Needle Length: 9cm  Needle Gauge: 21    Additional Needles:  Procedures: ultrasound guided Femoral nerve block Narrative:  Start time: 02/09/2012 7:05 AM End time: 02/09/2012 7:12 AM Injection made incrementally with aspirations every 5 mL.  Performed by: Personally  Anesthesiologist: C. Gusta Marksberry MD  Additional Notes: Ultrasound guidance used to: id relevant anatomy, confirm needle position, local anesthetic spread, avoidance of vascular puncture. Picture saved. No complications. Block performed personally by Janetta Hora. Gelene Mink, MD    Femoral nerve block

## 2012-02-10 ENCOUNTER — Encounter (HOSPITAL_COMMUNITY): Payer: Self-pay | Admitting: Orthopedic Surgery

## 2012-02-10 LAB — BASIC METABOLIC PANEL
Chloride: 104 mEq/L (ref 96–112)
GFR calc Af Amer: >90 mL/min (ref >90)
GFR calc non Af Amer: 89 mL/min — ABNORMAL LOW (ref >90)
Potassium: 3.6 mEq/L (ref 3.5–5.1)
Sodium: 138 mEq/L (ref 135–145)

## 2012-02-10 LAB — CBC
MCHC: 33.7 g/dL (ref 30.0–36.0)
RDW: 12.8 % (ref 11.5–15.5)
WBC: 11.1 10*3/uL — ABNORMAL HIGH (ref 4.0–10.5)

## 2012-02-10 MED ORDER — DSS 100 MG PO CAPS: ORAL_CAPSULE | ORAL | Status: DC

## 2012-02-10 MED ORDER — ACETAMINOPHEN 325 MG PO TABS: 650.0000 mg | ORAL_TABLET | Freq: Four times a day (QID) | ORAL | Status: DC | PRN

## 2012-02-10 MED ORDER — DEXAMETHASONE 2 MG PO TABS: 430.0000 mg | ORAL_TABLET | Freq: Every day | ORAL | Status: DC

## 2012-02-10 MED ORDER — BISACODYL 5 MG PO TBEC: DELAYED_RELEASE_TABLET | ORAL | Status: DC

## 2012-02-10 MED ORDER — OXYCODONE HCL 5 MG PO TABS: ORAL_TABLET | ORAL | Status: DC

## 2012-02-10 NOTE — Discharge Summary (Signed)
Patient ID: MARCELLOUS SNARSKI MRN: [id number] DOB/AGE: 1941-04-13 71 y.o.  Admit date: 02/09/2012 Discharge date: 02/10/2012  Admission Diagnoses:  Principal Problem:  *Right knee DJD Active Problems:  Hypertension  Dysrhythmia  Sleep apnea   Discharge Diagnoses:  Same  Past Medical History  Diagnosis Date  . Hypertension   . Dysrhythmia     RBBB  . Stroke     TIA 15 yrs ago  . Sleep apnea     on CPAP nightly  . Right knee DJD   . Hypercholesteremia   . 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 male who was admitted 02/09/2012 for operative treatment ofRight knee DJD. Patient has severe unremitting pain that affects sleep, daily activities, and work/hobbies. After pre-op clearance the patient was taken to the operating room on 02/09/2012 and underwent  Procedure(s): TOTAL KNEE ARTHROPLASTY.    Patient was given perioperative antibiotics: Anti-infectives     Start     Dose/Rate Route Frequency Ordered Stop   02/09/12 1330   ceFAZolin (ANCEF) IVPB 2 g/50 mL premix        2 g 100 mL/hr over 30 Minutes Intravenous Every 6 hours 02/09/12 1136 02/09/12 2034   02/09/12 0800   cefUROXime (ZINACEF) injection  Status:  Discontinued          As needed 02/09/12 0800 02/09/12 0919   02/08/12 1346   ceFAZolin (ANCEF) IVPB 2 g/50 mL premix        2 g 100 mL/hr over 30 Minutes Intravenous 60 min pre-op 02/08/12 1346 02/09/12 0730           Patient was given sequential compression devices, early ambulation, and chemoprophylaxis to prevent DVT.  Patient benefited maximally from hospital stay and there were no complications.    Recent vital signs: Patient Vitals for the past 24 hrs:  BP Temp Temp src Pulse Resp SpO2  02/10/12 0800 - - - - 18  -  02/10/12 0539 112/58 mmHg 98.1 F (36.7 C) - 61  16   98 %  02/10/12 0400 - - - - 18  -  02/10/12 0200 117/64 mmHg 98.3 F (36.8 C) - 67  16  97 %  02/10/12 0000 - - - - 18  98 %  02/09/12 2135 126/66 mmHg 97.9 F (36.6 C) Oral 61  18  96 %  02/09/12 2000 - - - - 18  -  02/09/12 1646 157/75 mmHg 98.8 F (37.1 C) - 75  14  100 %  02/09/12 1300 133/73 mmHg 98.1 F (36.7 C) - 61  - 96 %  02/09/12 1040 140/71 mmHg 97.9 F (36.6 C) - 64  15  95 %     Recent laboratory studies:  Basename 02/10/12 0530  WBC 11.1*  HGB 11.4*  HCT 33.8*  PLT 193  NA 138  K 3.6  CL 104  CO2 24  BUN 12  CREATININE 0.80  GLUCOSE 137*  INR --  CALCIUM 8.1*     Discharge Medications:     Medication List     As of 02/10/2012 10:39 AM    TAKE these medications         acetaminophen 325 MG tablet   Commonly known as: TYLENOL   Take 2 tablets (650 mg total) by mouth every 6 (  six) hours as needed (or Fever >/= 101).      aspirin 325 MG tablet   Take 325 mg by mouth daily. Takes with 25 mg Dipyridamole      bisacodyl 5 MG EC tablet   Commonly known as: DULCOLAX   2 tablets each night with dinner until bowels are regular.  Laxitive      dexamethasone 2 MG tablet   Commonly known as: DECADRON   Take 215 tablets (430 mg total) by mouth daily.      dipyridamole 25 MG tablet   Commonly known as: PERSANTINE   Take 25 mg by mouth daily. Takes with 325 mg Aspirin      DSS 100 MG Caps   1 tab 2 times a day.   Stool softener      hydrochlorothiazide 25 MG tablet   Commonly known as: HYDRODIURIL   Take 25 mg by mouth daily.      losartan 50 MG tablet   Commonly known as: COZAAR   Take 50 mg by mouth daily.      multivitamin-iron-minerals-folic acid chewable tablet   Chew 1 tablet by mouth daily.      niacin 500 MG tablet   Take 500 mg by mouth daily with breakfast.      oxyCODONE 5 MG immediate release tablet   Commonly known as: Oxy IR/ROXICODONE   1-2 tab every 4-6 hrs as needed for pain      simvastatin 80 MG tablet   Commonly  known as: ZOCOR   Take 80 mg by mouth daily.        Diagnostic Studies: Dg Chest 2 View  02/04/2012  *RADIOLOGY REPORT*  Clinical Data: Hypertension.  Sleep apnea.  CHEST - 2 VIEW  Comparison: 01/02/2006  Findings: The cardiac silhouette is normal in size and configuration.  No mediastinal or hilar masses or adenopathy noted. The lungs are hyperexpanded with flattened hemidiaphragms.  The lungs are clear.  No pleural effusion or pneumothorax.  The bony thorax is intact.  IMPRESSION: No acute cardiopulmonary disease.  Lung hyperexpansion suggests COPD   Original Report Authenticated By: Domenic Moras, M.D.     Disposition: 01-Home or Self Care      Discharge Orders    Future Orders Please Complete By Expires   Diet - low sodium heart healthy      Call MD / Call 911      Comments:   If you experience chest pain or shortness of breath, CALL 911 and be transported to the hospital emergency room.  If you develope a fever above 101 F, pus (white drainage) or increased drainage or redness at the wound, or calf pain, call your surgeon's office.   Constipation Prevention      Comments:   Drink plenty of fluids.  Prune juice may be helpful.  You may use a stool softener, such as Colace (over the counter) 100 mg twice a day.  Use MiraLax (over the counter) for constipation as needed.   Increase activity slowly as tolerated      Discharge instructions      Comments:   Total Knee Replacement Care After Refer to this sheet in the next few weeks. These discharge instructions provide you with general information on caring for yourself after you leave the hospital. Your caregiver may also give you specific instructions. Your treatment has been planned according to the most current medical practices available, but unavoidable complications sometimes occur. If you have any problems or  questions after discharge, please call your caregiver. Regaining a near full range of motion of your knee within the  first 3 to 6 weeks after surgery is critical. HOME CARE INSTRUCTIONS  You may resume a normal diet and activities as directed.  Perform exercises as directed.  Place yellow foam block, yellow side up under heel at all times except when in CPM or when walking.  DO NOT modify, tear, cut, or change in any way. You will receive physical therapy daily  Take showers instead of baths until informed otherwise.  Change bandages (dressings)daily Do not take over-the-counter or prescription medicines for pain, discomfort, or fever. Eat a well-balanced diet.  Avoid lifting or driving until you are instructed otherwise.  Make an appointment to see your caregiver for stitches (suture) or staple removal as directed.  If you have been sent home with a continuous passive motion machine (CPM machine), 0-90 degrees 6 hrs a day   2 hrs a shift SEEK MEDICAL CARE IF: You have swelling of your calf or leg.  You develop shortness of breath or chest pain.  You have redness, swelling, or increasing pain in the wound.  There is pus or any unusual drainage coming from the surgical site.  You notice a bad smell coming from the surgical site or dressing.  The surgical site breaks open after sutures or staples have been removed.  There is persistent bleeding from the suture or staple line.  You are getting worse or are not improving.  You have any other questions or concerns.  SEEK IMMEDIATE MEDICAL CARE IF:  You have a fever.  You develop a rash.  You have difficulty breathing.  You develop any reaction or side effects to medicines given.  Your knee motion is decreasing rather than improving.  MAKE SURE YOU:  Understand these instructions.  Will watch your condition.  Will get help right away if you are not doing well or get worse.   CPM      Comments:   Continuous passive motion machine (CPM):      Use the CPM from 0 to 90 for 6 hours per day.       You may break it up into 2 or 3 sessions per day.      Use  CPM for 2 weeks or until you are told to stop.   TED hose      Comments:   Use stockings (TED hose) for 2 weeks on both leg(s).  You may remove them at night for sleeping.   Change dressing      Comments:   Change the dressing daily with sterile 4 x 4 inch gauze dressing and apply TED hose.  You may clean the incision with alcohol prior to redressing.   Do not put a pillow under the knee. Place it under the heel.      Comments:   Place yellow foam block, yellow side up under heel at all times except when in CPM or when walking.  DO NOT modify, tear, cut, or change in any way the yellow foam block.      Follow-up Information    Follow up with Nilda Simmer, MD. On 02/23/2012. (appt time 3 pm)    Contact information:   [address] [place] [address] [phone]           Signed: Pascal Lux 02/10/2012, 10:39 AM

## 2012-02-10 NOTE — Progress Notes (Signed)
Physical Therapy Treatment Patient Details Name: Caleb Barton MRN: [id number] DOB: 02-Jul-1940 Today's Date: 02/10/2012 Time: 0926-1002 PT Time Calculation (min): 36 min  PT Assessment / Plan / Recommendation Comments on Treatment Session  Pt doing extremely well with mobility s/p TKR. Pt improving also in ROM and muscle activity, though still limied by bulky post op dressing.  Pt will be OK to d/c to home today with HHPT    Follow Up Recommendations        Does the patient have the potential to tolerate intense rehabilitation     Barriers to Discharge        Equipment Recommendations       Recommendations for Other Services    Frequency     Plan Discharge plan remains appropriate;Frequency remains appropriate    Precautions / Restrictions Precautions Precautions: Knee Required Braces or Orthoses: Knee Immobilizer - Right Knee Immobilizer - Right: On when out of bed or walking Restrictions Weight Bearing Restrictions: Yes RLE Weight Bearing: Weight bearing as tolerated   Pertinent Vitals/Pain Pt states pain in right knee is "a half"    Mobility  Bed Mobility Bed Mobility: Supine to Sit;Sitting - Scoot to Edge of Bed Supine to Sit: 4: Min assist;HOB flat Sitting - Scoot to [place] of Bed: 4: Min assist Details for Bed Mobility Assistance: Min assist for R LE to minimize pain.   Transfers Transfers: Sit to Stand;Stand to Sit Sit to Stand: 5: Supervision Stand to Sit: With upper extremity assist;To chair/3-in-1;5: Supervision Details for Transfer Assistance: Cues for technique and and hand placement.   Ambulation/Gait Ambulation/Gait Assistance: 5: Supervision Ambulation Distance (Feet): 300 Feet (150 x 2) Assistive device: Rolling walker Ambulation/Gait Assistance Details: did not use KI for second walk due to ability to SLR Gait Pattern: Step-to pattern;Decreased stride length Gait velocity: improving General Gait Details: pt improving in gait pattern with flexion of  right knee during gait without knee immobilizer No increase in pain or feeling of instability except  for minor incident when knee is in full extension Stairs: Yes Stairs Assistance: 6: Modified independent (Device/Increase time) Stair Management Technique: One rail Right;With crutches;Two rails Number of Stairs: 10  Wheelchair Mobility Wheelchair Mobility: No    Exercises Total Joint Exercises Ankle Circles/Pumps: AROM;10 reps;Right;Supine Quad Sets: AROM;Right;Supine Short Arc Quad: AROM;Right;10 reps;Supine Heel Slides: AAROM;Right;10 reps;Supine Straight Leg Raises: AROM;Right;10 reps;Supine Long Arc Quad: AAROM;10 reps;Seated Knee Flexion: AROM;10 reps;Seated   PT Diagnosis:    PT Problem List:   PT Treatment Interventions:     PT Goals Acute Rehab PT Goals PT Goal Formulation: With patient/family Time For Goal Achievement: 02/16/12 Potential to Achieve Goals: Good Pt will go Supine/Side to Sit: Independently;with HOB 0 degrees PT Goal: Supine/Side to Sit - Progress: Progressing toward goal Pt will go Sit to Supine/Side: Independently;with HOB 0 degrees PT Goal: Sit to Supine/Side - Progress: Progressing toward goal Pt will Transfer Bed to Chair/Chair to Bed: with modified independence PT Transfer Goal: Bed to Chair/Chair to Bed - Progress: Progressing toward goal Pt will Ambulate: >150 feet;with modified independence;with rolling walker PT Goal: Ambulate - Progress: Progressing toward goal Pt will Go Up / Down Stairs: Flight;with supervision;with rail(s);with crutches (1 rail and 1 crutch.  ) PT Goal: Up/Down Stairs - Progress: Met Pt will Perform Home Exercise Program: with supervision, verbal cues required/provided PT Goal: Perform Home Exercise Program - Progress: Met  Visit Information  Last PT Received On: 02/10/12 Assistance Needed: +1    Subjective Data  Subjective: I may go home this afternoon Patient Stated Goal: to practice steps   Cognition  Overall  Cognitive Status: Appears within functional limits for tasks assessed/performed Arousal/Alertness: Awake/alert Orientation Level: Appears intact for tasks assessed Behavior During Session: [facility] for tasks performed    Balance     End of Session PT - End of Session Equipment Utilized During Treatment: Gait belt;Right knee immobilizer Activity Tolerance: Patient tolerated treatment well Patient left: in bed;with family/visitor present Nurse Communication: Mobility status   GP     Rosey Bath K. [place], [place] [phone] 02/10/2012, 10:42 AM

## 2012-02-10 NOTE — Progress Notes (Signed)
Physical Therapy Treatment Patient Details Name: Caleb Barton MRN: [id number] DOB: [birth year] Today's Date: 02/10/2012 Time: 4098-1191 PT Time Calculation (min): 31 min  PT Assessment / Plan / Recommendation Comments on Treatment Session  Goals met, pt ready to d/c to home with HHPT    Follow Up Recommendations  Home health PT     Does the patient have the potential to tolerate intense rehabilitation     Barriers to Discharge        Equipment Recommendations  None recommended by OT    Recommendations for Other Services    Frequency     Plan Discharge plan remains appropriate;Frequency remains appropriate    Precautions / Restrictions Precautions Precautions: Knee Required Braces or Orthoses: Knee Immobilizer - Right Knee Immobilizer - Right: On when out of bed or walking Restrictions Weight Bearing Restrictions: Yes RLE Weight Bearing: Weight bearing as tolerated   Pertinent Vitals/Pain 1/10 with weight bearing    Mobility  Bed Mobility Bed Mobility: Supine to Sit;Sitting - Scoot to Edge of Bed;Sit to Supine Supine to Sit: HOB flat;7: Independent Sitting - Scoot to Edge of Bed: 7: Independent Sit to Supine: 5: Supervision;HOB flat Details for Bed Mobility Assistance: Min assist for R LE to minimize pain.   Transfers Transfers: Sit to Stand;Stand to Sit Sit to Stand: With upper extremity assist;From bed;7: Independent Stand to Sit: With upper extremity assist;To bed;7: Independent Details for Transfer Assistance: Cues for correct hand placement  Ambulation/Gait Ambulation/Gait Assistance: 6: Modified independent (Device/Increase time) Ambulation Distance (Feet): 300 Feet Assistive device: Rolling walker Ambulation/Gait Assistance Details: did not use KI for second walk due to ability to SLR Gait Pattern: Step-to pattern Gait velocity: improving General Gait Details: pt more confident and independent with RW. Stairs: Yes Stairs Assistance: 6: Modified  independent (Device/Increase time) Stair Management Technique: Two rails;One rail Right;With crutches Number of Stairs: 10  Wheelchair Mobility Wheelchair Mobility: No    Exercises Total Joint Exercises Ankle Circles/Pumps: AROM;10 reps;Right;Supine Quad Sets: AROM;Right;Supine Short Arc Quad: AROM;Right;10 reps;Supine Heel Slides: AAROM;Right;10 reps;Supine Hip ABduction/ADduction: AROM;Right;5 reps;Standing Straight Leg Raises: AROM;Right;10 reps;Supine Long Arc Quad: AAROM;10 reps;Seated Knee Flexion: AROM;10 reps;Seated Other Exercises Other Exercises: standing isometrics to abdominals and glutes   PT Diagnosis:    PT Problem List:   PT Treatment Interventions:     PT Goals Acute Rehab PT Goals PT Goal Formulation: With patient/family Time For Goal Achievement: 02/16/12 Potential to Achieve Goals: Good Pt will go Supine/Side to Sit: Independently;with HOB 0 degrees PT Goal: Supine/Side to Sit - Progress: Met Pt will go Sit to Supine/Side: Independently;with HOB 0 degrees PT Goal: Sit to Supine/Side - Progress: Met Pt will Transfer Bed to Chair/Chair to Bed: with modified independence PT Transfer Goal: Bed to Chair/Chair to Bed - Progress: Met Pt will Ambulate: >150 feet;with modified independence;with rolling walker PT Goal: Ambulate - Progress: Met Pt will Go Up / Down Stairs: Flight;with supervision;with rail(s);with crutches (1 rail and 1 crutch.  ) PT Goal: Up/Down Stairs - Progress: Met Pt will Perform Home Exercise Program: with supervision, verbal cues required/provided PT Goal: Perform Home Exercise Program - Progress: Met  Visit Information  Last PT Received On: 02/10/12 Assistance Needed: +1    Subjective Data  Subjective: I'm ready to go! Patient Stated Goal: to have HHPT tomorrow at home   Cognition  Overall Cognitive Status: Appears within functional limits for tasks assessed/performed Arousal/Alertness: Awake/alert Orientation Level: Appears intact  for tasks assessed Behavior During Session: [facility] for tasks  performed    Balance     End of Session PT - End of Session Equipment Utilized During Treatment: Right knee immobilizer Activity Tolerance: Patient tolerated treatment well Patient left: with family/visitor present Nurse Communication: Mobility status   GP     Rosey Bath K. [place], [place] [phone] 02/10/2012, 1:34 PM

## 2012-02-10 NOTE — Progress Notes (Signed)
Occupational Therapy Discharge Patient Details Name: Caleb Barton MRN: [id number] DOB: 01-Jul-1940 Today's Date: 02/10/2012 Time: 9147-8295 OT Time Calculation (min): 12 min  Patient discharged from OT services secondary to Pt. is able to complete most ADL's at supervision level. Pt. and wife state he requires min (A) to don pants over RLE. Educated pt. and wife on use of AE for (A) with LB dressing and bathing. Wife plans to purchase reacher at [facility] or local drug store. No further acute OT needs at this time, pt safe to d/c hom at this level.    Please see latest therapy progress note for current level of functioning and progress toward goals.    Progress and discharge plan discussed with patient and/or caregiver: Patient/Caregiver agrees with plan  GO     Cleora Fleet 02/10/2012, 11:17 AM

## 2012-02-10 NOTE — Evaluation (Signed)
Occupational Therapy Evaluation Patient Details Name: Caleb Barton MRN: [id number] DOB: 03/13/1941 Today's Date: 02/10/2012 Time: 4098-1191 OT Time Calculation (min): 12 min  OT Assessment / Plan / Recommendation Clinical Impression  Pt. 71 yo male s/p right TKA. Pt. is completing most ADL's at supervision level including tub transfer. Pt. and wife state that he requires min (A) to don pants over RLE but that he is able to complete the rest of his dressing independently. Educated on use of AE to (A) with LB dressing and bathing. Wife plans to purchase reacher at [facility] or other local drug store. Pt. is safe to d/c at this level, OT to sign off.     OT Assessment  Patient does not need any further OT services    Follow Up Recommendations  Supervision - Intermittent    Barriers to Discharge      Equipment Recommendations  None recommended by OT    Recommendations for Other Services    Frequency       Precautions / Restrictions Precautions Precautions: Knee Required Braces or Orthoses: Knee Immobilizer - Right Knee Immobilizer - Right: On when out of bed or walking Restrictions Weight Bearing Restrictions: Yes RLE Weight Bearing: Weight bearing as tolerated   Pertinent Vitals/Pain Pt. States minimal pain with activity    ADL  Toilet Transfer: Buyer, retail Method: Sit to Barista: Raised toilet seat without arms Tub/Shower Transfer: Engineer, manufacturing Method: Ambulating Equipment Used: Gait belt;Rolling walker Transfers/Ambulation Related to ADLs: Supervision for safety during ambulation and transfers.  ADL Comments: Pt. already dressed upon OT entering room. Pt. and wife state that she assists with donning his RLE into pant legs and he is able to complete the rest of his LB dressing on his own. Educated pt and wife on use of  AE to assist with donning and doffing pants. Pt wife plans to purchase  reacher at local [facility] or other drug store. Pt. completed tub transfer with supervision for safety while stepping over the tub. Required min vc for walker positioning during transfer.      OT Diagnosis:    OT Problem List:   OT Treatment Interventions:     OT Goals    Visit Information  Last OT Received On: 02/10/12 Assistance Needed: +1    Subjective Data  Subjective: I am just elated at how good I feel and how well I am doing Patient Stated Goal: To go home today    Prior Functioning     Home Living Lives With: Spouse Available Help at Discharge: Family Type of Home: House Home Access: Stairs to enter Secretary/administrator of Steps: 2 Entrance Stairs-Rails: Left;Right Home Layout: Two level Alternate Level Stairs-Number of Steps: 13 Alternate Level Stairs-Rails: Right;Left;Can reach both Bathroom Shower/Tub: Tub/shower unit;Curtain Bathroom Toilet: Handicapped height Bathroom Accessibility: Yes Prior Function Level of Independence: Independent Able to Take Stairs?: Yes Driving: Yes Vocation: Self employed Communication Communication: No difficulties Dominant Hand: Right         Vision/Perception     Cognition  Overall Cognitive Status: Appears within functional limits for tasks assessed/performed Arousal/Alertness: Awake/alert Orientation Level: Appears intact for tasks assessed Behavior During Session: [facility] for tasks performed    Extremity/Trunk Assessment Right Upper Extremity Assessment RUE ROM/Strength/Tone: Within functional levels Left Upper Extremity Assessment LUE ROM/Strength/Tone: Within functional levels     Mobility Bed Mobility Bed Mobility: Supine to Sit;Sitting - Scoot to Edge of Bed;Sit to Supine Supine to Sit: 5: Supervision;HOB flat  Sitting - Scoot to Edge of Bed: 5: Supervision Sit to Supine: 5: Supervision;HOB flat Details for Bed Mobility Assistance: Min assist for R LE to minimize pain.   Transfers Transfers: Sit to  Stand;Stand to Sit Sit to Stand: 5: Supervision;With upper extremity assist;From bed Stand to Sit: 5: Supervision;With upper extremity assist;To bed Details for Transfer Assistance: Cues for correct hand placement           Exercise Total Joint Exercises Ankle Circles/Pumps: AROM;10 reps;Right;Supine Quad Sets: AROM;Right;Supine Short Arc Quad: AROM;Right;10 reps;Supine Heel Slides: AAROM;Right;10 reps;Supine Straight Leg Raises: AROM;Right;10 reps;Supine Long Arc Quad: AAROM;10 reps;Seated Knee Flexion: AROM;10 reps;Seated   Balance     End of Session OT - End of Session Equipment Utilized During Treatment: Gait belt Activity Tolerance: Patient tolerated treatment well Patient left: in bed;with call bell/phone within reach;with family/visitor present Nurse Communication: Mobility status  GO     Cleora Fleet 02/10/2012, 11:17 AM

## 2012-02-10 NOTE — Evaluation (Signed)
I agree with the following treatment note after reviewing documentation.   Johnston, Timiya Howells Brynn   OTR/L Pager: [phone] Office: [phone] .   

## 2012-02-10 NOTE — Care Management Note (Signed)
    Page 1 of 1   02/10/2012     1:28:49 PM   CARE MANAGEMENT NOTE 02/10/2012  Patient:  Caleb Barton, Caleb Barton   Account Number:  [id number]  Date Initiated:  02/10/2012  Documentation initiated by:  Anette Guarneri  Subjective/Objective Assessment:   right TKA  [facility] services pre-arranged by MD office pta  has DME     Action/Plan:   home with [facility] services   Anticipated DC Date:  02/11/2012   Anticipated DC Plan:  HOME W HOME HEALTH SERVICES      DC Planning Services  CM consult      Choice offered to / List presented to:             Status of service:  Completed, signed off Medicare Important Message given?   (If response is "NO", the following Medicare IM given date fields will be blank) Date Medicare IM given:   Date Additional Medicare IM given:    Discharge Disposition:  HOME W HOME HEALTH SERVICES  Per UR Regulation:  Reviewed for med. necessity/level of care/duration of stay  If discussed at Long Length of Stay Meetings, dates discussed:    Comments:  02/10/12 13:27  Anette Guarneri RN/CM [facility] services pre-arranged by MD office with [facility] per Mr. Caleb Barton he has all DME(CPM, RW, 3n1)

## 2012-02-10 NOTE — Progress Notes (Signed)
I agree with the following treatment note after reviewing documentation.   Johnston, Hipolito Martinezlopez Brynn   OTR/L Pager: [phone] Office: [phone] .   

## 2012-07-20 ENCOUNTER — Other Ambulatory Visit: Payer: Self-pay

## 2012-07-20 ENCOUNTER — Ambulatory Visit
Admission: RE | Admit: 2012-07-20 | Discharge: 2012-07-20 | Disposition: A | Discharge status: Home / Self Care | Payer: Medicare Other | Source: Ambulatory Visit

## 2012-07-20 DIAGNOSIS — R05 Cough: Secondary | ICD-10-CM

## 2012-07-20 DIAGNOSIS — R059 Cough, unspecified: Secondary | ICD-10-CM

## 2012-09-03 ENCOUNTER — Encounter (HOSPITAL_COMMUNITY): Payer: Self-pay

## 2012-09-06 ENCOUNTER — Encounter (HOSPITAL_COMMUNITY): Payer: Self-pay

## 2012-09-06 ENCOUNTER — Encounter (HOSPITAL_COMMUNITY)
Admission: RE | Admit: 2012-09-06 | Discharge: 2012-09-06 | Disposition: A | Discharge status: Home / Self Care | Payer: Medicare Other | Source: Ambulatory Visit | Attending: Neurological Surgery | Admitting: Neurological Surgery

## 2012-09-06 ENCOUNTER — Other Ambulatory Visit: Payer: Self-pay | Admitting: Neurological Surgery

## 2012-09-06 LAB — BASIC METABOLIC PANEL
BUN: 14 mg/dL (ref 6–23)
GFR calc non Af Amer: 87 mL/min — ABNORMAL LOW (ref >90)
Glucose, Bld: 86 mg/dL (ref 70–99)
Potassium: 3.5 mEq/L (ref 3.5–5.1)

## 2012-09-06 LAB — CBC
HCT: 43.9 % (ref 39.0–52.0)
Hemoglobin: 15.3 g/dL (ref 13.0–17.0)
MCH: 31.7 pg (ref 26.0–34.0)
MCHC: 34.9 g/dL (ref 30.0–36.0)

## 2012-09-06 NOTE — Progress Notes (Signed)
Requested sleep study from dr.donna gates [phone]

## 2012-09-06 NOTE — Progress Notes (Signed)
Requested orders from vanessa-scheduler with dr.jones

## 2012-09-06 NOTE — Progress Notes (Signed)
Dr.Donna Kevan Ny is Medical MD  Released from cardiologist about 76yrs ago  Stress test and echo done about 54yrs ago  Denies ever having a heart cath  ekg and cxr in epic

## 2012-09-06 NOTE — Pre-Procedure Instructions (Signed)
REFOEL PALLADINO  09/06/2012   Your procedure is scheduled on:  THURS, MAY 22 @ 10:45 AM  Report to [facility] at 8:45 AM.  Call this number if you have problems the morning of surgery: [phone]   Remember:   Do not eat food or drink liquids after midnight.   Take these medicines the morning of surgery with A SIP OF WATER: PAIN PILL(IF NEEDED)   Do not wear jewelry  Do not wear lotions, powders, or colognes. You may wear deodorant.  Men may shave face and neck.  Do not bring valuables to the hospital.  Contacts, dentures or bridgework may not be worn into surgery.  Leave suitcase in the car. After surgery it may be brought to your room.  For patients admitted to the hospital, checkout time is 11:00 AM the day of  discharge.   Patients discharged the day of surgery will not be allowed to drive  home.    Special Instructions: Shower using CHG 2 nights before surgery and the night before surgery.  If you shower the day of surgery use CHG.  Use special wash - you have one bottle of CHG for all showers.  You should use approximately 1/3 of the bottle for each shower.   Please read over the following fact sheets that you were given: Pain Booklet, Coughing and Deep Breathing, MRSA Information and Surgical Site Infection Prevention

## 2012-09-07 NOTE — Progress Notes (Signed)
Anesthesia chart review: Patient is a 72 year old male posted for one level or laminectomy/decompression microdiscectomy by Dr. Yetta Barre on 09/09/12.  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 PCP is Dr. Shaune Pollack.  EKG on 02/04/12 showed sinus rhythm with short PR, right bundle branch block, left anterior fascicular block, bifascicular block, minimal voltage criteria for LVH. His EKG was not felt significantly changed from his heart EKG on 02/21/2010. 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 See EKGs in Muse.)   Cardiac cath on 01/08/06 (Dr. Verdis Prime) showed:  1. Essentially normal coronary arteries.  2. Overall normal left ventricular function. EF 60%.  3. False positive Cardiolite study.   Chest x-ray on 07/20/2012 showed no acute cardiopulmonary disease. Lung hyperexpansion suggesting COPD.   Labs noted.   Patient tolerated TKR in October 2013.  Anticipate he can proceed as planned.  Velna Ochs [facility]/Anesthesiology Phone [phone] 09/07/2012 10:36 AM

## 2012-09-08 MED ORDER — CEFAZOLIN SODIUM-DEXTROSE 2-3 GM-% IV SOLR
2.0000 g | INTRAVENOUS | Status: AC
  Administered 2012-09-09: 2 g via INTRAVENOUS
  Filled 2012-09-08: qty 50

## 2012-09-08 MED ORDER — DEXAMETHASONE SODIUM PHOSPHATE 10 MG/ML IJ SOLN
10.0000 mg | INTRAMUSCULAR | Status: AC
  Administered 2012-09-09: 10 mg via INTRAVENOUS
  Filled 2012-09-08: qty 1

## 2012-09-09 ENCOUNTER — Ambulatory Visit (HOSPITAL_COMMUNITY): Payer: Medicare Other | Admitting: Anesthesiology

## 2012-09-09 ENCOUNTER — Ambulatory Visit (HOSPITAL_COMMUNITY): Payer: Medicare Other

## 2012-09-09 ENCOUNTER — Encounter (HOSPITAL_COMMUNITY): Payer: Self-pay | Admitting: Anesthesiology

## 2012-09-09 ENCOUNTER — Encounter (HOSPITAL_COMMUNITY): Payer: Self-pay | Admitting: Vascular Surgery

## 2012-09-09 ENCOUNTER — Encounter (HOSPITAL_COMMUNITY)
Admission: RE | Disposition: A | Discharge status: Home / Self Care | Payer: Self-pay | Source: Ambulatory Visit | Attending: Neurological Surgery

## 2012-09-09 ENCOUNTER — Ambulatory Visit (HOSPITAL_COMMUNITY)
Admission: RE | Admit: 2012-09-09 | Discharge: 2012-09-10 | Disposition: A | Discharge status: Home / Self Care | Payer: Medicare Other | Source: Ambulatory Visit | Attending: Neurological Surgery | Admitting: Neurological Surgery

## 2012-09-09 DIAGNOSIS — Z9889 Other specified postprocedural states: Secondary | ICD-10-CM

## 2012-09-09 DIAGNOSIS — Z79899 Other long term (current) drug therapy: Secondary | ICD-10-CM | POA: Insufficient documentation

## 2012-09-09 DIAGNOSIS — I10 Essential (primary) hypertension: Secondary | ICD-10-CM | POA: Insufficient documentation

## 2012-09-09 DIAGNOSIS — Z01812 Encounter for preprocedural laboratory examination: Secondary | ICD-10-CM | POA: Insufficient documentation

## 2012-09-09 DIAGNOSIS — M5126 Other intervertebral disc displacement, lumbar region: Secondary | ICD-10-CM | POA: Insufficient documentation

## 2012-09-09 HISTORY — PX: LUMBAR LAMINECTOMY/DECOMPRESSION MICRODISCECTOMY: SHX5026

## 2012-09-09 LAB — PROTIME-INR: Prothrombin Time: 11.6 seconds (ref 11.6–15.2)

## 2012-09-09 LAB — DIFFERENTIAL
Eosinophils Relative: 7 % — ABNORMAL HIGH (ref 0–5)
Lymphocytes Relative: 17 % (ref 12–46)
Lymphs Abs: 1.5 10*3/uL (ref 0.7–4.0)
Monocytes Absolute: 0.6 10*3/uL (ref 0.1–1.0)
Monocytes Relative: 8 % (ref 3–12)

## 2012-09-09 SURGERY — LUMBAR LAMINECTOMY/DECOMPRESSION MICRODISCECTOMY 1 LEVEL
Anesthesia: General | Site: Back | Laterality: Right | Wound class: Clean

## 2012-09-09 MED ORDER — HYDROCHLOROTHIAZIDE 25 MG PO TABS
25.0000 mg | ORAL_TABLET | Freq: Every day | ORAL | Status: DC
  Administered 2012-09-09: 25 mg via ORAL
  Filled 2012-09-09 (×2): qty 1

## 2012-09-09 MED ORDER — LOSARTAN POTASSIUM 50 MG PO TABS
50.0000 mg | ORAL_TABLET | Freq: Every day | ORAL | Status: DC
  Administered 2012-09-09: 50 mg via ORAL
  Filled 2012-09-09 (×2): qty 1

## 2012-09-09 MED ORDER — LIDOCAINE HCL (CARDIAC) 20 MG/ML IV SOLN
INTRAVENOUS | Status: DC | PRN
  Administered 2012-09-09: 80 mg via INTRAVENOUS

## 2012-09-09 MED ORDER — ONDANSETRON HCL 4 MG/2ML IJ SOLN: 4.0000 mg | Freq: Four times a day (QID) | INTRAMUSCULAR | Status: DC | PRN

## 2012-09-09 MED ORDER — METHYLPREDNISOLONE ACETATE 80 MG/ML IJ SUSP
INTRAMUSCULAR | Status: DC | PRN
  Administered 2012-09-09: 80 mg

## 2012-09-09 MED ORDER — NEOSTIGMINE METHYLSULFATE 1 MG/ML IJ SOLN
INTRAMUSCULAR | Status: DC | PRN
  Administered 2012-09-09: 3 mg via INTRAVENOUS

## 2012-09-09 MED ORDER — GLYCOPYRROLATE 0.2 MG/ML IJ SOLN
INTRAMUSCULAR | Status: DC | PRN
  Administered 2012-09-09: 0.4 mg via INTRAVENOUS

## 2012-09-09 MED ORDER — OXYCODONE HCL 5 MG PO TABS
10.0000 mg | ORAL_TABLET | ORAL | Status: DC | PRN
  Administered 2012-09-09: 10 mg via ORAL
  Filled 2012-09-09 (×2): qty 1

## 2012-09-09 MED ORDER — ARTIFICIAL TEARS OP OINT
TOPICAL_OINTMENT | OPHTHALMIC | Status: DC | PRN
  Administered 2012-09-09: 1 via OPHTHALMIC

## 2012-09-09 MED ORDER — PROPOFOL 10 MG/ML IV BOLUS
INTRAVENOUS | Status: DC | PRN
  Administered 2012-09-09: 200 mg via INTRAVENOUS

## 2012-09-09 MED ORDER — HEMOSTATIC AGENTS (NO CHARGE) OPTIME
TOPICAL | Status: DC | PRN
  Administered 2012-09-09: 1 via TOPICAL

## 2012-09-09 MED ORDER — OXYCODONE HCL 5 MG PO TABS
5.0000 mg | ORAL_TABLET | Freq: Once | ORAL | Status: AC | PRN
  Administered 2012-09-09: 5 mg via ORAL

## 2012-09-09 MED ORDER — FENTANYL CITRATE 0.05 MG/ML IJ SOLN
INTRAMUSCULAR | Status: DC | PRN
  Administered 2012-09-09: 50 ug via INTRAVENOUS

## 2012-09-09 MED ORDER — SODIUM CHLORIDE 0.9 % IJ SOLN: 3.0000 mL | INTRAMUSCULAR | Status: DC | PRN

## 2012-09-09 MED ORDER — DEXAMETHASONE SODIUM PHOSPHATE 4 MG/ML IJ SOLN
4.0000 mg | Freq: Four times a day (QID) | INTRAMUSCULAR | Status: DC
  Administered 2012-09-09: 4 mg via INTRAVENOUS
  Filled 2012-09-09 (×5): qty 1

## 2012-09-09 MED ORDER — MENTHOL 3 MG MT LOZG: 1.0000 | LOZENGE | OROMUCOSAL | Status: DC | PRN

## 2012-09-09 MED ORDER — ONDANSETRON HCL 4 MG/2ML IJ SOLN
INTRAMUSCULAR | Status: DC | PRN
  Administered 2012-09-09: 4 mg via INTRAVENOUS

## 2012-09-09 MED ORDER — THROMBIN 5000 UNITS EX SOLR
CUTANEOUS | Status: DC | PRN
  Administered 2012-09-09 (×2): 5000 [IU] via TOPICAL

## 2012-09-09 MED ORDER — SODIUM CHLORIDE 0.9 % IV SOLN
INTRAVENOUS | Status: AC
  Filled 2012-09-09: qty 500

## 2012-09-09 MED ORDER — POTASSIUM CHLORIDE IN NACL 20-0.9 MEQ/L-% IV SOLN
INTRAVENOUS | Status: DC
  Filled 2012-09-09 (×3): qty 1000

## 2012-09-09 MED ORDER — METHOCARBAMOL 100 MG/ML IJ SOLN
500.0000 mg | Freq: Four times a day (QID) | INTRAVENOUS | Status: DC | PRN
  Filled 2012-09-09: qty 5

## 2012-09-09 MED ORDER — DEXAMETHASONE 4 MG PO TABS
4.0000 mg | ORAL_TABLET | Freq: Four times a day (QID) | ORAL | Status: DC
  Administered 2012-09-09 – 2012-09-10 (×3): 4 mg via ORAL
  Filled 2012-09-09 (×7): qty 1

## 2012-09-09 MED ORDER — SODIUM CHLORIDE 0.9 % IR SOLN
Status: DC | PRN
  Administered 2012-09-09: 12:00:00

## 2012-09-09 MED ORDER — ACETAMINOPHEN 325 MG PO TABS: 650.0000 mg | ORAL_TABLET | ORAL | Status: DC | PRN

## 2012-09-09 MED ORDER — FENTANYL CITRATE 0.05 MG/ML IJ SOLN
INTRAMUSCULAR | Status: DC | PRN
  Administered 2012-09-09 (×5): 50 ug via INTRAVENOUS

## 2012-09-09 MED ORDER — MORPHINE SULFATE 2 MG/ML IJ SOLN: 1.0000 mg | INTRAMUSCULAR | Status: DC | PRN

## 2012-09-09 MED ORDER — 0.9 % SODIUM CHLORIDE (POUR BTL) OPTIME
TOPICAL | Status: DC | PRN
  Administered 2012-09-09: 1000 mL

## 2012-09-09 MED ORDER — HYDROMORPHONE HCL PF 1 MG/ML IJ SOLN
INTRAMUSCULAR | Status: AC
  Filled 2012-09-09: qty 1

## 2012-09-09 MED ORDER — CEFAZOLIN SODIUM 1-5 GM-% IV SOLN
1.0000 g | Freq: Three times a day (TID) | INTRAVENOUS | Status: AC
  Administered 2012-09-09 – 2012-09-10 (×2): 1 g via INTRAVENOUS
  Filled 2012-09-09 (×2): qty 50

## 2012-09-09 MED ORDER — PHENOL 1.4 % MT LIQD: 1.0000 | OROMUCOSAL | Status: DC | PRN

## 2012-09-09 MED ORDER — OXYCODONE HCL 5 MG PO TABS
ORAL_TABLET | ORAL | Status: AC
  Filled 2012-09-09: qty 1

## 2012-09-09 MED ORDER — METHOCARBAMOL 500 MG PO TABS
500.0000 mg | ORAL_TABLET | Freq: Four times a day (QID) | ORAL | Status: DC | PRN
  Administered 2012-09-09: 500 mg via ORAL
  Filled 2012-09-09: qty 1

## 2012-09-09 MED ORDER — ONDANSETRON HCL 4 MG/2ML IJ SOLN: 4.0000 mg | INTRAMUSCULAR | Status: DC | PRN

## 2012-09-09 MED ORDER — SODIUM CHLORIDE 0.9 % IJ SOLN
3.0000 mL | Freq: Two times a day (BID) | INTRAMUSCULAR | Status: DC
  Administered 2012-09-09 (×2): 3 mL via INTRAVENOUS

## 2012-09-09 MED ORDER — BACITRACIN 50000 UNITS IM SOLR
INTRAMUSCULAR | Status: AC
  Filled 2012-09-09: qty 1

## 2012-09-09 MED ORDER — LACTATED RINGERS IV SOLN
INTRAVENOUS | Status: DC | PRN
  Administered 2012-09-09 (×2): via INTRAVENOUS

## 2012-09-09 MED ORDER — HYDROMORPHONE HCL PF 1 MG/ML IJ SOLN
0.2500 mg | INTRAMUSCULAR | Status: DC | PRN
  Administered 2012-09-09: 0.5 mg via INTRAVENOUS

## 2012-09-09 MED ORDER — LIDOCAINE HCL 4 % MT SOLN
OROMUCOSAL | Status: DC | PRN
  Administered 2012-09-09: 4 mL via TOPICAL

## 2012-09-09 MED ORDER — FENTANYL CITRATE 0.05 MG/ML IJ SOLN
INTRAMUSCULAR | Status: AC
  Filled 2012-09-09: qty 2

## 2012-09-09 MED ORDER — NIACIN 500 MG PO TABS
500.0000 mg | ORAL_TABLET | Freq: Every day | ORAL | Status: DC
  Administered 2012-09-09: 500 mg via ORAL
  Filled 2012-09-09 (×3): qty 1

## 2012-09-09 MED ORDER — ACETAMINOPHEN 650 MG RE SUPP: 650.0000 mg | RECTAL | Status: DC | PRN

## 2012-09-09 MED ORDER — ROCURONIUM BROMIDE 100 MG/10ML IV SOLN
INTRAVENOUS | Status: DC | PRN
  Administered 2012-09-09: 50 mg via INTRAVENOUS

## 2012-09-09 MED ORDER — OXYCODONE HCL 5 MG/5ML PO SOLN: 5.0000 mg | Freq: Once | ORAL | Status: AC | PRN

## 2012-09-09 MED ORDER — ACETAMINOPHEN 10 MG/ML IV SOLN
1000.0000 mg | Freq: Four times a day (QID) | INTRAVENOUS | Status: DC
  Administered 2012-09-09 – 2012-09-10 (×3): 1000 mg via INTRAVENOUS
  Filled 2012-09-09 (×4): qty 100

## 2012-09-09 MED ORDER — LATANOPROST 0.005 % OP SOLN
1.0000 [drp] | Freq: Every day | OPHTHALMIC | Status: DC
  Filled 2012-09-09: qty 2.5

## 2012-09-09 SURGICAL SUPPLY — 44 items
BAG DECANTER FOR FLEXI CONT (MISCELLANEOUS) ×2 IMPLANT
BENZOIN TINCTURE PRP APPL 2/3 (GAUZE/BANDAGES/DRESSINGS) ×2 IMPLANT
BUR MATCHSTICK NEURO 3.0 LAGG (BURR) ×2 IMPLANT
CANISTER SUCTION 2500CC (MISCELLANEOUS) ×2 IMPLANT
CLOTH BEACON ORANGE TIMEOUT ST (SAFETY) ×2 IMPLANT
CONT SPEC 4OZ CLIKSEAL STRL BL (MISCELLANEOUS) ×2 IMPLANT
DRAPE LAPAROTOMY 100X72X124 (DRAPES) ×2 IMPLANT
DRAPE MICROSCOPE ZEISS OPMI (DRAPES) ×2 IMPLANT
DRAPE POUCH INSTRU U-SHP 10X18 (DRAPES) ×2 IMPLANT
DRAPE SURG 17X23 STRL (DRAPES) ×2 IMPLANT
DRESSING TELFA 8X3 (GAUZE/BANDAGES/DRESSINGS) ×2 IMPLANT
DRSG OPSITE 4X5.5 SM (GAUZE/BANDAGES/DRESSINGS) ×2 IMPLANT
DURAPREP 26ML APPLICATOR (WOUND CARE) ×2 IMPLANT
ELECT REM PT RETURN 9FT ADLT (ELECTROSURGICAL) ×2
ELECTRODE REM PT RTRN 9FT ADLT (ELECTROSURGICAL) ×1 IMPLANT
GAUZE SPONGE 4X4 16PLY XRAY LF (GAUZE/BANDAGES/DRESSINGS) IMPLANT
GLOVE BIOGEL M 8.0 STRL (GLOVE) ×2 IMPLANT
GLOVE BIOGEL PI IND STRL 8.5 (GLOVE) ×1 IMPLANT
GLOVE BIOGEL PI INDICATOR 8.5 (GLOVE) ×1
GLOVE ECLIPSE 8.0 STRL XLNG CF (GLOVE) ×2 IMPLANT
GOWN BRE IMP SLV AUR LG STRL (GOWN DISPOSABLE) IMPLANT
GOWN BRE IMP SLV AUR XL STRL (GOWN DISPOSABLE) ×2 IMPLANT
GOWN PREVENTION PLUS XXLARGE (GOWN DISPOSABLE) ×2 IMPLANT
GOWN STRL REIN 2XL LVL4 (GOWN DISPOSABLE) IMPLANT
HEMOSTAT POWDER KIT SURGIFOAM (HEMOSTASIS) IMPLANT
KIT BASIN OR (CUSTOM PROCEDURE TRAY) ×2 IMPLANT
KIT ROOM TURNOVER OR (KITS) ×2 IMPLANT
NEEDLE HYPO 25X1 1.5 SAFETY (NEEDLE) ×2 IMPLANT
NEEDLE SPNL 20GX3.5 QUINCKE YW (NEEDLE) IMPLANT
NS IRRIG 1000ML POUR BTL (IV SOLUTION) ×2 IMPLANT
PACK LAMINECTOMY NEURO (CUSTOM PROCEDURE TRAY) ×2 IMPLANT
PAD ARMBOARD 7.5X6 YLW CONV (MISCELLANEOUS) ×6 IMPLANT
RUBBERBAND STERILE (MISCELLANEOUS) ×4 IMPLANT
SPONGE SURGIFOAM ABS GEL SZ50 (HEMOSTASIS) ×2 IMPLANT
STRIP CLOSURE SKIN 1/2X4 (GAUZE/BANDAGES/DRESSINGS) ×2 IMPLANT
SUT VIC AB 0 CT1 18XCR BRD8 (SUTURE) ×1 IMPLANT
SUT VIC AB 0 CT1 8-18 (SUTURE) ×1
SUT VIC AB 2-0 CP2 18 (SUTURE) ×2 IMPLANT
SUT VIC AB 3-0 SH 8-18 (SUTURE) ×2 IMPLANT
SYR 20ML ECCENTRIC (SYRINGE) ×2 IMPLANT
TAPE STRIPS DRAPE STRL (GAUZE/BANDAGES/DRESSINGS) ×2 IMPLANT
TOWEL OR 17X24 6PK STRL BLUE (TOWEL DISPOSABLE) ×2 IMPLANT
TOWEL OR 17X26 10 PK STRL BLUE (TOWEL DISPOSABLE) ×2 IMPLANT
WATER STERILE IRR 1000ML POUR (IV SOLUTION) ×2 IMPLANT

## 2012-09-09 NOTE — Preoperative (Signed)
Beta Blockers   Reason not to administer Beta Blockers:Not Applicable 

## 2012-09-09 NOTE — Plan of Care (Signed)
Problem: Consults Goal: Diagnosis - Spinal Surgery Outcome: Completed/Met Date Met:  09/09/12 Microdiscectomy     

## 2012-09-09 NOTE — Transfer of Care (Signed)
Immediate Anesthesia Transfer of Care Note  Patient: Caleb Barton  Procedure(s) Performed: Procedure(s) with comments: LUMBAR LAMINECTOMY/DECOMPRESSION MICRODISCECTOMY 1 LEVEL (Right) - LUMBAR LAMINECTOMY/DECOMPRESSION MICRODISCECTOMY 1 LEVEL  Patient Location: PACU  Anesthesia Type:General  Level of Consciousness: awake, alert  and oriented  Airway & Oxygen Therapy: Patient Spontanous Breathing and Patient connected to face mask oxygen  Post-op Assessment: Report given to PACU RN  Post vital signs: Reviewed and stable  Complications: No apparent anesthesia complications

## 2012-09-09 NOTE — Op Note (Signed)
09/09/2012  12:16 PM  PATIENT:  Caleb Barton  72 y.o. 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 Danielle Dess  ANESTHESIA:   General  EBL: 10 ml  Total I/O In: 1200 [I.V.:1200] Out: 10 [Blood:10]  BLOOD ADMINISTERED:none  DRAINS: None   SPECIMEN:  No Specimen  INDICATION FOR PROCEDURE: This patient presented with severe right leg pain in an L4 distribution. MRI showed a large herniated disc in the extra foraminal space at L4-5 on the right. I recommended right L4-5 extra foraminal microdiscectomy. Patient understood the risks, benefits, and alternatives and potential outcomes and wished to proceed.  PROCEDURE DETAILS: The patient was taken to the operating room and after induction of adequate generalized endotracheal anesthesia, the patient was rolled into the prone position on the Wilson frame and all pressure points were padded. The lumbar region was cleaned and then prepped with DuraPrep and draped in the usual sterile fashion. 5 cc of local anesthesia was injected and then a dorsal midline incision was made and carried down to the lumbo sacral fascia. The fascia was opened and the paraspinous musculature was taken down in a subperiosteal fashion to expose L4-5 on the right. Intraoperative x-ray confirmed my level, and then I used a combination of the high-speed drill and the Kerrison punches to perform a extraforaminal exposure at L45 on the right. A drill the superior part of the facet and the lateral part of the pars. The underlying yellow ligament was opened and removed in a piecemeal fashion to expose the underlying exiting nerve root. In the axilla the nerve root I found several pieces of herniated nucleus pulposus. These were removed with I nerve hook and a  pituitary rongeur. Continued to remove fragments until the nerve was well decompressed. . I then palpated with a coronary dilator along the nerve root and into the foramen to assure adequate decompression. I felt no more compression of the nerve root. I irrigated with saline solution containing bacitracin. Achieved hemostasis with bipolar cautery, lined the dura with Gelfoam, and then closed the fascia with 0 Vicryl. I closed the subcutaneous tissues with 2-0 Vicryl and the subcuticular tissues with 3-0 Vicryl. The skin was then closed with benzoin and Steri-Strips. The drapes were removed, a sterile dressing was applied. The patient was awakened from general anesthesia and transferred to the recovery room in stable condition. At the end of the procedure all sponge, needle and instrument counts were correct.   PLAN OF CARE: Admit for overnight observation  PATIENT DISPOSITION:  PACU - hemodynamically stable.   Delay start of Pharmacological VTE agent (>24hrs) due to surgical blood loss or risk of bleeding:  yes

## 2012-09-09 NOTE — Anesthesia Preprocedure Evaluation (Signed)
Anesthesia Evaluation  Patient identified by MRN, date of birth, ID band Patient awake    Reviewed: Allergy & Precautions, H&P , NPO status , Patient's Chart, lab work & pertinent test results  Airway Mallampati: II  Neck ROM: full    Dental   Pulmonary sleep apnea ,          Cardiovascular hypertension,     Neuro/Psych TIA   GI/Hepatic GERD-  ,  Endo/Other  obese  Renal/GU      Musculoskeletal   Abdominal   Peds  Hematology   Anesthesia Other Findings   Reproductive/Obstetrics                           Anesthesia Physical Anesthesia Plan  ASA: II  Anesthesia Plan: General   Post-op Pain Management:    Induction: Intravenous  Airway Management Planned: Oral ETT  Additional Equipment:   Intra-op Plan:   Post-operative Plan: Extubation in OR  Informed Consent: I have reviewed the patients History and Physical, chart, labs and discussed the procedure including the risks, benefits and alternatives for the proposed anesthesia with the patient or authorized representative who has indicated his/her understanding and acceptance.     Plan Discussed with: CRNA, Anesthesiologist and Surgeon  Anesthesia Plan Comments:         Anesthesia Quick Evaluation

## 2012-09-09 NOTE — Anesthesia Postprocedure Evaluation (Signed)
Anesthesia Post Note  Patient: Caleb Barton  Procedure(s) Performed: Procedure(s) (LRB): LUMBAR LAMINECTOMY/DECOMPRESSION MICRODISCECTOMY 1 LEVEL (Right)  Anesthesia type: General  Patient location: PACU  Post pain: Pain level controlled and Adequate analgesia  Post assessment: Post-op Vital signs reviewed, Patient's Cardiovascular Status Stable, Respiratory Function Stable, Patent Airway and Pain level controlled  Last Vitals:  Filed Vitals:   09/09/12 1300  BP:   Pulse: 52  Temp:   Resp: 23    Post vital signs: Reviewed and stable  Level of consciousness: awake, alert  and oriented  Complications: No apparent anesthesia complications

## 2012-09-09 NOTE — H&P (Signed)
Subjective: Patient is a 72 y.o. male admitted for right leg pain. Onset of symptoms was a few weeks ago, gradually worsening since that time.  The pain is rated severe, and is located at the across the lower back and radiates to right lower extremity in an L4 distribution. The pain is described as aching, sharp and stabbing and occurs all day. The symptoms have been progressive. Symptoms are exacerbated by exercise and standing. MRI or CT showed extraforaminal disc protrusion at L4-5 on the right.   Past Medical History  Diagnosis Date  . Dysrhythmia     RBBB  . Right knee DJD   . Hypercholesteremia     takes Lipitor daily and niacin daily  . 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 Sleep apnea     cpap;sleep study done at home;request report from dr.gates  . Stroke     TIA 15 yrs ago;takes Plavix  but on hold for surgery  . Chronic back pain   . GERD (gastroesophageal reflux disease)     rolaids prn  . Hypertension     takes HCTZ daily and losartan daily  . Nocturia     Past Surgical History  Procedure Laterality Date  . Tonsillectomy    . Colonoscopy      with polyps  . Colonoscopy  08/20/2011    Procedure: COLONOSCOPY;  Surgeon: Vertell Novak., MD;  Location: [facility];  Service: Endoscopy;  Laterality: N/A;  . Eye surgery      laser bilateral eyes  . Knee arthroscopy  x 2 right knee  . 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 Authorizing Provider  atorvastatin (LIPITOR) 40 MG tablet Take 40 mg by mouth daily.   Yes Historical Provider, MD  docusate sodium (COLACE) 100 MG capsule Take 100 mg by mouth 2 (two) times daily as needed for constipation.   Yes Historical Provider, MD  hydrochlorothiazide (HYDRODIURIL) 25 MG tablet Take 25 mg by mouth  daily.   Yes Historical Provider, MD  latanoprost (XALATAN) 0.005 % ophthalmic solution Place 1 drop into the left eye at bedtime.   Yes Historical Provider, MD  losartan (COZAAR) 50 MG tablet Take 50 mg by mouth daily.   Yes Historical Provider, MD  methocarbamol (ROBAXIN) 500 MG tablet Take 500 mg by mouth 4 (four) times daily.   Yes Historical Provider, MD  multivitamin-iron-minerals-folic acid (CENTRUM) chewable tablet Chew 1 tablet by mouth daily.   Yes Historical Provider, MD  niacin 500 MG tablet Take 500 mg by mouth daily with breakfast.   Yes Historical Provider, MD  oxyCODONE-acetaminophen (PERCOCET/ROXICET) 5-325 MG per tablet Take 1-2 tablets by mouth every 4 (four) hours as needed for pain.   Yes Historical Provider, MD  predniSONE (STERAPRED UNI-PAK) 10 MG tablet Take 20 mg by mouth See admin instructions. Pt taking 2 daily for 2 more days as of 09/03/12   Yes Historical Provider, MD  clopidogrel (PLAVIX) 75 MG tablet Take 75 mg by mouth daily.    Historical Provider, MD   Allergies  Allergen Reactions  . Sulfa Antibiotics Rash    History  Substance Use Topics  . Smoking status: Never Smoker   . Smokeless tobacco: Not on file     Comment:  quit 55yrs ago  . Alcohol Use: Yes     Comment: social    Family History  Problem Relation Age of Onset  . Stroke Mother      Review of Systems  Positive ROS: neg  All other systems have been reviewed and were otherwise negative with the exception of those mentioned in the HPI and as above.  Objective: Vital signs in last 24 hours: Temp:  [97 F (36.1 C)] 97 F (36.1 C) (05/22 0842) Pulse Rate:  [68] 68 (05/22 0842) Resp:  [20] 20 (05/22 0842) BP: (143)/(86) 143/86 mmHg (05/22 0842) SpO2:  [96 %] 96 % (05/22 0842)  General Appearance: Alert, cooperative, no distress, appears stated age Head: Normocephalic, without obvious abnormality, atraumatic Eyes: PERRL, conjunctiva/corneas clear, EOM's intact      Neck: Supple Back:  Symmetric Lungs: , respirations unlabored Heart: Regular rate and rhythm Abdomen: Soft, non-tender Extremities: Extremities normal, atraumatic, no cyanosis or edema Pulses: 2+ and symmetric all extremities Skin: Skin color, texture, turgor normal, no rashes or lesions  NEUROLOGIC:   Mental status: Alert and oriented x4,  no aphasia, good attention span, fund of knowledge, and memory Motor Exam - grossly normal Sensory Exam - grossly normal Reflexes: 1+ Coordination - grossly normal Gait -not tested Balance - grossly normal Cranial Nerves: I: smell Not tested  II: visual acuity  OS: nl    OD: nl  II: visual fields Full to confrontation  II: pupils Equal, round, reactive to light  III,VII: ptosis None  III,IV,VI: extraocular muscles  Full ROM  V: mastication Normal  V: facial light touch sensation  Normal  V,VII: corneal reflex  Present  VII: facial muscle function - upper  Normal  VII: facial muscle function - lower Normal  VIII: hearing Not tested  IX: soft palate elevation  Normal  IX,X: gag reflex Present  XI: trapezius strength  5/5  XI: sternocleidomastoid strength 5/5  XI: neck flexion strength  5/5  XII: tongue strength  Normal    Data Review Lab Results  Component Value Date   WBC 10.3 09/06/2012   HGB 15.3 09/06/2012   HCT 43.9 09/06/2012   MCV 90.9 09/06/2012   PLT 261 09/06/2012   Lab Results  Component Value Date   NA 139 09/06/2012   K 3.5 09/06/2012   CL 100 09/06/2012   CO2 28 09/06/2012   BUN 14 09/06/2012   CREATININE 0.84 09/06/2012   GLUCOSE 86 09/06/2012   Lab Results  Component Value Date   INR 0.85 09/09/2012    Assessment/Plan: Patient admitted for right L4-5 extraforaminal microdiscectomy. Patient has failed conservative therapy.  I explained the condition and procedure to the patient and answered any questions.  Patient wishes to proceed with procedure as planned. Understands risks/ benefits and typical outcomes of  procedure.   JONES,DAVID S 09/09/2012 10:32 AM

## 2012-09-10 ENCOUNTER — Encounter (HOSPITAL_COMMUNITY): Payer: Self-pay | Admitting: Neurological Surgery

## 2012-09-10 NOTE — Discharge Summary (Signed)
Physician Discharge Summary  Patient ID: Caleb Barton MRN: [id number] DOB/AGE: [birth year] 72 y.o.  Admit date: 09/09/2012 Discharge date: 09/10/2012  Admission Diagnoses: Right L4 radiculopathy    Discharge Diagnoses: Same   Discharged Condition: good  Hospital Course: The patient was admitted on 09/09/2012 and taken to the operating room where the patient underwent right L4-5 extraforaminal microdiscectomy. The patient tolerated the procedure well and was taken to the recovery room and then to the floor in stable condition. The hospital course was routine. There were no complications. The wound remained clean dry and intact. Pt had appropriate back soreness. No complaints of leg pain or new N/T/W. The patient remained afebrile with stable vital signs, and tolerated a regular diet. The patient continued to increase activities, and pain was well controlled with oral pain medications.   Consults: None  Significant Diagnostic Studies:  Results for orders placed during the hospital encounter of 09/09/12  PROTIME-INR      Result Value Range   Prothrombin Time 11.6  11.6 - 15.2 seconds   INR 0.85  0.00 - 1.49  DIFFERENTIAL      Result Value Range   Neutrophils Relative % 68  43 - 77 %   Neutro Abs 5.9  1.7 - 7.7 K/uL   Lymphocytes Relative 17  12 - 46 %   Lymphs Abs 1.5  0.7 - 4.0 K/uL   Monocytes Relative 8  3 - 12 %   Monocytes Absolute 0.6  0.1 - 1.0 K/uL   Eosinophils Relative 7 (*) 0 - 5 %   Eosinophils Absolute 0.6  0.0 - 0.7 K/uL   Basophils Relative 0  0 - 1 %   Basophils Absolute 0.0  0.0 - 0.1 K/uL    Dg Lumbar Spine 2-3 Views  09/09/2012   *RADIOLOGY REPORT*  Clinical Data: Microdiskectomy  LUMBAR SPINE - 2-3 VIEW  Comparison: Lumbar spine 08/24/2002  Findings: Pain two intraoperative lateral views lumbar spine provided.  Initial view demonstrates a sharp tip probe posterior to the L4  body.  The second view demonstrates skin spreaders posterior to the L4 vertebral body  as well as a curve tip probe.  IMPRESSION:  Two intraoperative views of the  lumbar spine as above.   Original Report Authenticated By: Genevive Bi, M.D.    Antibiotics:  Anti-infectives   Start     Dose/Rate Route Frequency Ordered Stop   09/09/12 2000  ceFAZolin (ANCEF) IVPB 1 g/50 mL premix     1 g 100 mL/hr over 30 Minutes Intravenous Every 8 hours 09/09/12 1422 09/10/12 0420   09/09/12 1145  bacitracin 50,000 Units in sodium chloride irrigation 0.9 % 500 mL irrigation  Status:  Discontinued       As needed 09/09/12 1145 09/09/12 1217   09/09/12 1101  bacitracin 78295 UNITS injection    Comments:  STEELMAN, CRAIG: cabinet override      09/09/12 1101 09/09/12 2314   09/09/12 0600  ceFAZolin (ANCEF) IVPB 2 g/50 mL premix     2 g 100 mL/hr over 30 Minutes Intravenous On call to O.R. 09/08/12 1405 09/09/12 1116      Discharge Exam: Blood pressure 123/70, pulse 65, temperature 97.7 F (36.5 C), temperature source Oral, resp. rate 18, SpO2 98.00%. Neurologic: Grossly normal Incision clean and intact  Discharge Medications:     Medication List    TAKE these medications       atorvastatin 40 MG tablet  Commonly known as:  LIPITOR  Take  40 mg by mouth daily.     clopidogrel 75 MG tablet  Commonly known as:  PLAVIX  Take 75 mg by mouth daily.     docusate sodium 100 MG capsule  Commonly known as:  COLACE  Take 100 mg by mouth 2 (two) times daily as needed for constipation.     hydrochlorothiazide 25 MG tablet  Commonly known as:  HYDRODIURIL  Take 25 mg by mouth daily.     losartan 50 MG tablet  Commonly known as:  COZAAR  Take 50 mg by mouth daily.     methocarbamol 500 MG tablet  Commonly known as:  ROBAXIN  Take 500 mg by mouth 4 (four) times daily.     multivitamin-iron-minerals-folic acid chewable tablet  Chew 1 tablet by mouth daily.     niacin 500 MG tablet  Take 500 mg by mouth daily with breakfast.     oxyCODONE-acetaminophen 5-325 MG per tablet   Commonly known as:  PERCOCET/ROXICET  Take 1-2 tablets by mouth every 4 (four) hours as needed for pain.     predniSONE 10 MG tablet  Commonly known as:  STERAPRED UNI-PAK  Take 20 mg by mouth See admin instructions. Pt taking 2 daily for 2 more days as of 09/03/12     XALATAN 0.005 % ophthalmic solution  Generic drug:  latanoprost  Place 1 drop into the left eye at bedtime.        Disposition: Home   Final Dx: Right L4-5 extraforaminal microdiscectomy      Discharge Orders   Future Orders Complete By Expires     Call MD for:  difficulty breathing, headache or visual disturbances  As directed     Call MD for:  persistant nausea and vomiting  As directed     Call MD for:  redness, tenderness, or signs of infection (pain, swelling, redness, odor or green/yellow discharge around incision site)  As directed     Call MD for:  severe uncontrolled pain  As directed     Call MD for:  temperature >100.4  As directed     Diet - low sodium heart healthy  As directed     Discharge instructions  As directed     Comments:      No bending, twisting, or lifting more than 10 pounds. No driving for 1 week. May shower normally. Try not to sit for more than 30 minutes at a time.    Increase activity slowly  As directed     Remove dressing in 24 hours  As directed        Follow-up Information   Follow up with Wyllow Seigler S, MD In 2 weeks.   Contact information:   [address] [place] [address] [phone]        Signed: Tia Alert 09/10/2012, 8:31 AM

## 2012-11-10 ENCOUNTER — Other Ambulatory Visit: Payer: Self-pay

## 2013-10-18 ENCOUNTER — Other Ambulatory Visit: Payer: Self-pay | Admitting: Dermatology

## 2013-12-08 ENCOUNTER — Other Ambulatory Visit: Payer: Self-pay | Admitting: Dermatology

## 2014-04-04 ENCOUNTER — Ambulatory Visit
Admission: RE | Admit: 2014-04-04 | Discharge: 2014-04-04 | Disposition: A | Discharge status: Home / Self Care | Payer: Medicare Other | Source: Ambulatory Visit | Attending: Family Medicine | Admitting: Family Medicine

## 2014-04-04 ENCOUNTER — Other Ambulatory Visit: Payer: Self-pay | Admitting: Family Medicine

## 2014-04-04 DIAGNOSIS — R059 Cough, unspecified: Secondary | ICD-10-CM

## 2014-04-04 DIAGNOSIS — R05 Cough: Secondary | ICD-10-CM

## 2014-05-01 ENCOUNTER — Ambulatory Visit
Admission: RE | Admit: 2014-05-01 | Discharge: 2014-05-01 | Disposition: A | Discharge status: Home / Self Care | Payer: Medicare Other | Source: Ambulatory Visit | Attending: Family Medicine | Admitting: Family Medicine

## 2014-05-01 ENCOUNTER — Other Ambulatory Visit: Payer: Self-pay | Admitting: Family Medicine

## 2014-05-01 DIAGNOSIS — Z09 Encounter for follow-up examination after completed treatment for conditions other than malignant neoplasm: Secondary | ICD-10-CM

## 2014-11-01 ENCOUNTER — Ambulatory Visit (INDEPENDENT_AMBULATORY_CARE_PROVIDER_SITE_OTHER): Payer: Medicare Other | Admitting: Sports Medicine

## 2014-11-01 ENCOUNTER — Encounter: Payer: Self-pay | Admitting: Sports Medicine

## 2014-11-01 VITALS — BP 140/70 | HR 59 | Ht 69.0 in | Wt 220.0 lb

## 2014-11-01 DIAGNOSIS — M216X9 Other acquired deformities of unspecified foot: Secondary | ICD-10-CM | POA: Diagnosis not present

## 2014-11-01 DIAGNOSIS — M8430XA Stress fracture, unspecified site, initial encounter for fracture: Secondary | ICD-10-CM

## 2014-11-01 NOTE — Progress Notes (Signed)
   Subjective:    Patient ID: Caleb Barton, male    DOB: 03/27/1941, 74 y.o.   MRN: [id number]  HPI chief complaint: Right foot pain  Very pleasant 74 year old male comes in today at the request of Dr. Noemi Chapel for consideration of orthotics. Patient was diagnosed with a stress fracture via MRI back in March. He was placed into a Cam Walker for 4 weeks. He was then transitioned into a steel shank which he is currently wearing in his right shoe. Despite this he continues to have pain across the dorsum of his foot. It is worse with prolonged standing and walking. Dr. Noemi Chapel thought that custom orthotics may help. Patient denies any swelling in the foot. He does get some occasional numbness and tingling along the medial ankle into the medial aspect of his foot. Only prior surgery to this foot was an operation to correct a chronically extended fifth toe. He denies fevers or chills. No trauma.  Past medical history reviewed Medications reviewed Allergies reviewed    Review of Systems As above    Objective:   Physical Exam  Well-developed, well-nourished. No acute distress. Awake alert and oriented 3. Vital signs reviewed.  Right foot: There is tenderness to palpation along the midportion of the third metatarsal. Mild soft tissue swelling as well. No erythema. Area is not warm to touch. Mildly positive Tinel's at the tarsal tunnel. Flexible cavus foot with the patient going into pronation with standing and walking. Walking without a limp. No obvious leg length discrepancy.  Limited ultrasound evaluation of the right foot was performed. Along the proximal third metatarsal there is a layer of fluid but I do not see any evidence of definitive stress fracture nor of any callus formation.     Assessment & Plan:  Persistent right foot pain status post recent MRI diagnosed stress fracture  Patient was fitted with custom orthotics today. He found them to be comfortable prior to leaving the office. A  total of 45 minutes was spent with the patient with greater than 50% of the time spent in face-to-face consultation discussing his diagnosis and treatment. I have requested the records including the MRI report from Dr. Archie Endo office and the patient will follow-up with me in 4 weeks. It is possible that he has an element of tarsal tunnel syndrome as well as but hopefully the custom orthotics will help eliminate the intermittent numbness he is experiencing into his foot.  Patient was fitted for a : standard, cushioned, semi-rigid orthotic. The orthotic was heated and afterward the patient stood on the orthotic blank positioned on the orthotic stand. The patient was positioned in subtalar neutral position and 10 degrees of ankle dorsiflexion in a weight bearing stance. After completion of molding, a stable base was applied to the orthotic blank. The blank was ground to a stable position for weight bearing. Size: 9 Base: blue EVA Posting: none Additional orthotic padding: none

## 2014-11-09 ENCOUNTER — Encounter: Payer: Self-pay | Admitting: Sports Medicine

## 2014-11-29 ENCOUNTER — Ambulatory Visit (INDEPENDENT_AMBULATORY_CARE_PROVIDER_SITE_OTHER): Payer: Medicare Other | Admitting: Sports Medicine

## 2014-11-29 VITALS — BP 139/79 | Ht 69.0 in | Wt 220.0 lb

## 2014-11-29 DIAGNOSIS — M8430XD Stress fracture, unspecified site, subsequent encounter for fracture with routine healing: Secondary | ICD-10-CM

## 2014-11-30 NOTE — Progress Notes (Signed)
   Subjective:    Patient ID: Caleb Barton, male    DOB: [birth year], 74 y.o.   MRN: [id number]  HPI   Patient presents for follow-up of right foot pain. We made him custom orthotics at his last visit. I was able to get the notes including the MRI report from Dr. Noemi Chapel. Patient has found his orthotics to be comfortable. However he is only able to get relief when wearing them in one particular pair of shoes but he seems to be fine with this. When he wears his orthotics his pain is minimal. Still some pain with walking in sandals. No swelling.     Review of Systems     Objective:   Physical Exam Well-developed, well-nourished. No acute distress  Patient still has some slight tenderness to palpation at the base of the second metatarsal on the dorsum of his foot. No soft tissue swelling. Minimal pain with metatarsal squeeze. Neurovascularly intact distally. Walking without a limp with his custom orthotics in place.       Assessment & Plan:  Healing second metatarsal stress fracture, right foot  Patient's orthotics are comfortable. He will continue with these. Pain is improving. Follow-up with me as needed.

## 2015-05-30 DIAGNOSIS — H7322 Unspecified myringitis, left ear: Secondary | ICD-10-CM | POA: Insufficient documentation

## 2015-06-22 DIAGNOSIS — H903 Sensorineural hearing loss, bilateral: Secondary | ICD-10-CM | POA: Insufficient documentation

## 2016-05-09 DIAGNOSIS — Z125 Encounter for screening for malignant neoplasm of prostate: Secondary | ICD-10-CM | POA: Diagnosis not present

## 2016-05-09 DIAGNOSIS — I6930 Unspecified sequelae of cerebral infarction: Secondary | ICD-10-CM | POA: Diagnosis not present

## 2016-05-09 DIAGNOSIS — R109 Unspecified abdominal pain: Secondary | ICD-10-CM | POA: Diagnosis not present

## 2016-05-09 DIAGNOSIS — E669 Obesity, unspecified: Secondary | ICD-10-CM | POA: Diagnosis not present

## 2016-05-09 DIAGNOSIS — E785 Hyperlipidemia, unspecified: Secondary | ICD-10-CM | POA: Diagnosis not present

## 2016-05-09 DIAGNOSIS — I739 Peripheral vascular disease, unspecified: Secondary | ICD-10-CM | POA: Diagnosis not present

## 2016-05-09 DIAGNOSIS — Z0000 Encounter for general adult medical examination without abnormal findings: Secondary | ICD-10-CM | POA: Diagnosis not present

## 2016-05-09 DIAGNOSIS — R69 Illness, unspecified: Secondary | ICD-10-CM | POA: Diagnosis not present

## 2016-05-09 DIAGNOSIS — I10 Essential (primary) hypertension: Secondary | ICD-10-CM | POA: Diagnosis not present

## 2016-05-12 DIAGNOSIS — Z961 Presence of intraocular lens: Secondary | ICD-10-CM | POA: Diagnosis not present

## 2016-05-12 DIAGNOSIS — Z9849 Cataract extraction status, unspecified eye: Secondary | ICD-10-CM | POA: Diagnosis not present

## 2016-05-12 DIAGNOSIS — H11423 Conjunctival edema, bilateral: Secondary | ICD-10-CM | POA: Diagnosis not present

## 2016-05-12 DIAGNOSIS — H11153 Pinguecula, bilateral: Secondary | ICD-10-CM | POA: Diagnosis not present

## 2016-05-12 DIAGNOSIS — H47233 Glaucomatous optic atrophy, bilateral: Secondary | ICD-10-CM | POA: Diagnosis not present

## 2016-05-12 DIAGNOSIS — H18413 Arcus senilis, bilateral: Secondary | ICD-10-CM | POA: Diagnosis not present

## 2016-05-12 DIAGNOSIS — H401131 Primary open-angle glaucoma, bilateral, mild stage: Secondary | ICD-10-CM | POA: Diagnosis not present

## 2016-06-12 DIAGNOSIS — N411 Chronic prostatitis: Secondary | ICD-10-CM | POA: Diagnosis not present

## 2016-06-12 DIAGNOSIS — N509 Disorder of male genital organs, unspecified: Secondary | ICD-10-CM | POA: Diagnosis not present

## 2016-07-30 DIAGNOSIS — G4733 Obstructive sleep apnea (adult) (pediatric): Secondary | ICD-10-CM | POA: Diagnosis not present

## 2016-08-01 DIAGNOSIS — Z6833 Body mass index (BMI) 33.0-33.9, adult: Secondary | ICD-10-CM | POA: Diagnosis not present

## 2016-08-01 DIAGNOSIS — Z0000 Encounter for general adult medical examination without abnormal findings: Secondary | ICD-10-CM | POA: Diagnosis not present

## 2016-08-01 DIAGNOSIS — M2392 Unspecified internal derangement of left knee: Secondary | ICD-10-CM | POA: Diagnosis not present

## 2016-08-01 DIAGNOSIS — M2391 Unspecified internal derangement of right knee: Secondary | ICD-10-CM | POA: Diagnosis not present

## 2016-08-01 DIAGNOSIS — E669 Obesity, unspecified: Secondary | ICD-10-CM | POA: Diagnosis not present

## 2016-08-01 DIAGNOSIS — Z79899 Other long term (current) drug therapy: Secondary | ICD-10-CM | POA: Diagnosis not present

## 2016-08-01 DIAGNOSIS — H409 Unspecified glaucoma: Secondary | ICD-10-CM | POA: Diagnosis not present

## 2016-08-01 DIAGNOSIS — E785 Hyperlipidemia, unspecified: Secondary | ICD-10-CM | POA: Diagnosis not present

## 2016-08-01 DIAGNOSIS — I10 Essential (primary) hypertension: Secondary | ICD-10-CM | POA: Diagnosis not present

## 2016-08-01 DIAGNOSIS — Z7902 Long term (current) use of antithrombotics/antiplatelets: Secondary | ICD-10-CM | POA: Diagnosis not present

## 2016-08-19 ENCOUNTER — Other Ambulatory Visit: Payer: Self-pay | Admitting: Gastroenterology

## 2016-08-22 DIAGNOSIS — J069 Acute upper respiratory infection, unspecified: Secondary | ICD-10-CM | POA: Diagnosis not present

## 2016-09-04 ENCOUNTER — Encounter (HOSPITAL_COMMUNITY): Payer: Self-pay | Admitting: *Deleted

## 2016-09-06 ENCOUNTER — Ambulatory Visit (HOSPITAL_COMMUNITY): Payer: Medicare HMO | Admitting: Certified Registered Nurse Anesthetist

## 2016-09-08 ENCOUNTER — Ambulatory Visit (HOSPITAL_BASED_OUTPATIENT_CLINIC_OR_DEPARTMENT_OTHER)
Admit: 2016-09-08 | Discharge: 2016-09-08 | Disposition: A | Discharge status: Home / Self Care | Payer: Medicare HMO | Attending: Nurse Practitioner | Admitting: Nurse Practitioner

## 2016-09-08 ENCOUNTER — Encounter (HOSPITAL_COMMUNITY): Payer: Self-pay | Admitting: *Deleted

## 2016-09-08 ENCOUNTER — Ambulatory Visit (HOSPITAL_COMMUNITY)
Admission: RE | Admit: 2016-09-08 | Discharge: 2016-09-08 | Disposition: A | Discharge status: Home / Self Care | Payer: Medicare HMO | Source: Ambulatory Visit | Attending: Gastroenterology | Admitting: Gastroenterology

## 2016-09-08 ENCOUNTER — Encounter (HOSPITAL_COMMUNITY): Payer: Self-pay | Admitting: Nurse Practitioner

## 2016-09-08 ENCOUNTER — Encounter (HOSPITAL_COMMUNITY)
Admission: RE | Disposition: A | Discharge status: Home / Self Care | Payer: Self-pay | Source: Ambulatory Visit | Attending: Gastroenterology

## 2016-09-08 VITALS — BP 136/74 | HR 71 | Ht 69.0 in | Wt 227.4 lb

## 2016-09-08 DIAGNOSIS — Z5309 Procedure and treatment not carried out because of other contraindication: Secondary | ICD-10-CM | POA: Insufficient documentation

## 2016-09-08 DIAGNOSIS — I480 Paroxysmal atrial fibrillation: Secondary | ICD-10-CM | POA: Diagnosis not present

## 2016-09-08 DIAGNOSIS — I10 Essential (primary) hypertension: Secondary | ICD-10-CM | POA: Insufficient documentation

## 2016-09-08 DIAGNOSIS — I4891 Unspecified atrial fibrillation: Secondary | ICD-10-CM | POA: Diagnosis not present

## 2016-09-08 DIAGNOSIS — Z1211 Encounter for screening for malignant neoplasm of colon: Secondary | ICD-10-CM | POA: Insufficient documentation

## 2016-09-08 DIAGNOSIS — Z8719 Personal history of other diseases of the digestive system: Secondary | ICD-10-CM | POA: Diagnosis not present

## 2016-09-08 DIAGNOSIS — E7800 Pure hypercholesterolemia, unspecified: Secondary | ICD-10-CM | POA: Insufficient documentation

## 2016-09-08 DIAGNOSIS — Z9989 Dependence on other enabling machines and devices: Secondary | ICD-10-CM | POA: Insufficient documentation

## 2016-09-08 DIAGNOSIS — Z87891 Personal history of nicotine dependence: Secondary | ICD-10-CM | POA: Diagnosis not present

## 2016-09-08 DIAGNOSIS — Z96651 Presence of right artificial knee joint: Secondary | ICD-10-CM | POA: Insufficient documentation

## 2016-09-08 DIAGNOSIS — Z7901 Long term (current) use of anticoagulants: Secondary | ICD-10-CM | POA: Diagnosis not present

## 2016-09-08 DIAGNOSIS — Z79899 Other long term (current) drug therapy: Secondary | ICD-10-CM | POA: Diagnosis not present

## 2016-09-08 DIAGNOSIS — I4510 Unspecified right bundle-branch block: Secondary | ICD-10-CM | POA: Insufficient documentation

## 2016-09-08 DIAGNOSIS — Z8673 Personal history of transient ischemic attack (TIA), and cerebral infarction without residual deficits: Secondary | ICD-10-CM | POA: Insufficient documentation

## 2016-09-08 DIAGNOSIS — G4730 Sleep apnea, unspecified: Secondary | ICD-10-CM | POA: Diagnosis not present

## 2016-09-08 LAB — CBC
HCT: 43.6 % (ref 39.0–52.0)
HEMOGLOBIN: 14.7 g/dL (ref 13.0–17.0)
MCH: 31.1 pg (ref 26.0–34.0)
MCHC: 33.7 g/dL (ref 30.0–36.0)
MCV: 92.4 fL (ref 78.0–100.0)
PLATELETS: 229 10*3/uL (ref 150–400)
RBC: 4.72 MIL/uL (ref 4.22–5.81)
RDW: 12.6 % (ref 11.5–15.5)
WBC: 4.7 10*3/uL (ref 4.0–10.5)

## 2016-09-08 LAB — TSH: TSH: 1.491 u[IU]/mL (ref 0.350–4.500)

## 2016-09-08 SURGERY — CANCELLED PROCEDURE

## 2016-09-08 MED ORDER — RIVAROXABAN 20 MG PO TABS: 20.0000 mg | ORAL_TABLET | Freq: Every day | ORAL | 0 refills | Status: DC

## 2016-09-08 MED ORDER — ONDANSETRON HCL 4 MG/2ML IJ SOLN
INTRAMUSCULAR | Status: AC
  Filled 2016-09-08: qty 2

## 2016-09-08 MED ORDER — PROPOFOL 10 MG/ML IV BOLUS
INTRAVENOUS | Status: AC
  Filled 2016-09-08: qty 60

## 2016-09-08 MED ORDER — SODIUM CHLORIDE 0.9 % IV SOLN
INTRAVENOUS | Status: DC
Start: 2016-09-08 — End: 2016-09-08

## 2016-09-08 MED ORDER — LIDOCAINE 2% (20 MG/ML) 5 ML SYRINGE
INTRAMUSCULAR | Status: AC
  Filled 2016-09-08: qty 5

## 2016-09-08 MED ORDER — SODIUM CHLORIDE 0.9 % IV SOLN: INTRAVENOUS | Status: DC

## 2016-09-08 SURGICAL SUPPLY — 21 items

## 2016-09-08 NOTE — Progress Notes (Signed)
Pt's EKG in admit showed atrial fibrillation on the 3 lead, 12 lead EKG obtained and confirmed diagnoses of atrial fibrillation. Dr. Ermalene Postin and Dr. Oletta Lamas made aware and pt's procedure was cancelled for today. A copy of the 12 lead EKG was sent with pt. Cardiology was contacted by Dr. Oletta Lamas and they will contact the pt to make a appointment. Pt aware and discharged home. BRT, RN

## 2016-09-08 NOTE — Progress Notes (Signed)
The patient presented today for colonoscopy. EKG obtained before this revealed that he is an atrial fib with B of around 65. This procedure was canceled. This is a new finding and he will be seen by the cardiology group later today. We have contacted them and they will call him and arrange an appointment.

## 2016-09-08 NOTE — Patient Instructions (Signed)
Your physician has recommended you make the following change in your medication:  1)Start Xarelto 20mg once a day with supper  

## 2016-09-09 NOTE — Progress Notes (Addendum)
Primary Care Physician: [name], MD Referring Physician:   [name] is a 76 y.o. male with a h/o HTN, remote TIA, sleep apnea treatedwith cpap, that presented for an colonoscopy 5/21 am and was  found to be in rate controlled afib. Pt was unaware. Procedure was cancelled and he was asked to f/u in afib clinic. Ekg still shows afib, rate controlled and pt is asymptomatic. He did note some heart fluttering 3 weeks ago.  He denies a bleeding history. He is sedentary and obese. He does use cpap nightly. No tobacco use, minimal caffeine alcohol use. He states that he has had RBBB for a number of years  Today, he denies symptoms of palpitations, chest pain, shortness of breath, orthopnea, PND, lower extremity edema, dizziness, presyncope, syncope, or neurologic sequela. The patient is tolerating medications without difficulties and is otherwise without complaint today.   Past Medical History:  Diagnosis Date  . 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 Dysrhythmia    RBBB  . Hypercholesteremia    takes Lipitor daily and niacin daily  . Hypertension    takes HCTZ daily and losartan daily  . Nocturia   . Right knee DJD   . Sleep apnea    cpap;sleep study done at home;request report from dr.gates  . Stroke [facility])    TIA 15 yrs ago   Past Surgical History:  Procedure Laterality Date  . cataracts both eyes     ioc lens  . COLONOSCOPY     with polyps  . COLONOSCOPY  08/20/2011   Procedure: COLONOSCOPY;  Surgeon: Caleb Cunas., MD;  Location: [facility];  Service: Endoscopy;  Laterality: N/A;  . EYE SURGERY     laser bilateral eyes  . KNEE ARTHROSCOPY  x 2 right knee  . LUMBAR LAMINECTOMY/DECOMPRESSION MICRODISCECTOMY Right 09/09/2012   Procedure: LUMBAR LAMINECTOMY/DECOMPRESSION MICRODISCECTOMY 1 LEVEL;  Surgeon: Caleb Moore, MD;  Location: [facility];  Service: Neurosurgery;  Laterality: Right;  LUMBAR LAMINECTOMY/DECOMPRESSION MICRODISCECTOMY 1  LEVEL  . TONSILLECTOMY    . TOTAL KNEE ARTHROPLASTY  02/09/2012   Procedure: TOTAL KNEE ARTHROPLASTY;  Surgeon: Caleb Junes, MD;  Location: [facility];  Service: Orthopedics;  Laterality: Right;    Current Outpatient Prescriptions  Medication Sig Dispense Refill  . acetaminophen (TYLENOL) 500 MG tablet Take 1,500 mg by mouth 2 (two) times daily as needed for moderate pain or headache.    [name] amoxicillin (AMOXIL) 250 MG capsule Take 1,000 mg by mouth. 1 hour prior to dental appointments    . atorvastatin (LIPITOR) 40 MG tablet Take 40 mg by mouth daily.    [name] docusate sodium (COLACE) 100 MG capsule Take 100 mg by mouth daily as needed for moderate constipation.     . hydrochlorothiazide (HYDRODIURIL) 25 MG tablet Take 25 mg by mouth daily.    [name] latanoprost (XALATAN) 0.005 % ophthalmic solution Place 1 drop into both eyes at bedtime.     [name] losartan (COZAAR) 50 MG tablet Take 50 mg by mouth daily.    . Multiple Vitamin (MULTIVITAMIN WITH MINERALS) TABS tablet Take 1 tablet by mouth daily.    . niacin 500 MG tablet Take 500 mg by mouth daily.     . Potassium 99 MG TABS Take 99 mg by mouth daily.    . timolol (TIMOPTIC) 0.25 % ophthalmic solution Place 1 drop into both eyes 2 (two) times daily.     . rivaroxaban (XARELTO)  20 MG TABS tablet Take 1 tablet (20 mg total) by mouth daily with supper. 30 tablet 0   No current facility-administered medications for this encounter.     Allergies  Allergen Reactions  . Lisinopril     COUGH   . Sulfa Antibiotics Rash    Social History   Social History  . Marital status: Married    Spouse name: N/A  . Number of children: N/A  . Years of education: N/A   Occupational History  . Not on file.   Social History Main Topics  . Smoking status: Former Research scientist (life sciences)  . Smokeless tobacco: Never Used     Comment: quit 35 yrs ago  . Alcohol use Yes     Comment: social  . Drug use: No  . Sexual activity: Yes   Other Topics Concern  . Not on file    Social History Narrative  . No narrative on file    Family History  Problem Relation Age of Onset  . Stroke Mother     ROS- All systems are reviewed and negative except as per the HPI above  Physical Exam: Vitals:   09/08/16 1506  BP: 136/74  Pulse: 71  Weight: 227 lb 6.4 oz (103.1 kg)  Height: 5\' 9"  (1.753 m)   Wt Readings from Last 3 Encounters:  09/08/16 227 lb 6.4 oz (103.1 kg)  09/08/16 224 lb (101.6 kg)  11/29/14 220 lb (99.8 kg)    Labs: Lab Results  Component Value Date   NA 139 09/06/2012   K 3.5 09/06/2012   CL 100 09/06/2012   CO2 28 09/06/2012   GLUCOSE 86 09/06/2012   BUN 14 09/06/2012   CREATININE 0.84 09/06/2012   CALCIUM 9.2 09/06/2012   Lab Results  Component Value Date   INR 0.85 09/09/2012   No results found for: CHOL, HDL, LDLCALC, TRIG   GEN- The patient is well appearing, alert and oriented x 3 today.   Head- normocephalic, atraumatic Eyes-  Sclera clear, conjunctiva pink Ears- hearing intact Oropharynx- clear Neck- supple, no JVP Lymph- no cervical lymphadenopathy Lungs- Clear to ausculation bilaterally, normal work of breathing Heart- irregular rate and rhythm, no murmurs, rubs or gallops, PMI not laterally displaced GI- soft, NT, ND, + BS Extremities- no clubbing, cyanosis, or edema MS- no significant deformity or atrophy Skin- no rash or lesion Psych- euthymic mood, full affect Neuro- strength and sensation are intact  EKG- afib at 71 bpm, qrs int 138 ms, qtc 445 ms Epic records reviewed    Assessment and Plan: 1. Afib, new onset, asymptomatic Not sure at this point if it is paroxysmal or persistent General education re afib Denies bleeding history Bleeding precautions reviewed  It could be just the prep for the colonoscopy itself may have  provoked afib He is rate controlled and will not start rate control agents at this time chads vasc score of at least 4 (age. TIA x 2, htn) I did speak to Dr. Inda Merlin and she  agreed that he could stop clopidogrel for remote TIA and start  xarelto 20 mg a day with dinner meal (labs obtained from pcp show  creatinine at 0.93, crcl cal at 103, so will be on appropriate dose) Cbc and tsh today Echo Encouraged regular exercise and weight loss  F/u in 2 weeks  Butch Penny C. Carroll, [facility] [address] [place], [place] [address] [phone]

## 2016-09-09 NOTE — Addendum Note (Signed)
Encounter addended by: Sherran Needs, NP on: 09/09/2016  1:56 PM<BR>    Actions taken: Sign clinical note, LOS modified

## 2016-09-09 NOTE — Addendum Note (Signed)
Encounter addended by: Sherran Needs, NP on: 09/09/2016  1:58 PM<BR>    Actions taken: Sign clinical note

## 2016-09-11 DIAGNOSIS — G4733 Obstructive sleep apnea (adult) (pediatric): Secondary | ICD-10-CM | POA: Diagnosis not present

## 2016-09-23 ENCOUNTER — Ambulatory Visit (HOSPITAL_COMMUNITY)
Admission: RE | Admit: 2016-09-23 | Discharge: 2016-09-23 | Disposition: A | Discharge status: Home / Self Care | Payer: Medicare HMO | Source: Ambulatory Visit | Attending: Nurse Practitioner | Admitting: Nurse Practitioner

## 2016-09-23 ENCOUNTER — Ambulatory Visit (HOSPITAL_COMMUNITY): Payer: Medicare HMO

## 2016-09-23 DIAGNOSIS — I480 Paroxysmal atrial fibrillation: Secondary | ICD-10-CM | POA: Diagnosis not present

## 2016-09-23 DIAGNOSIS — I348 Other nonrheumatic mitral valve disorders: Secondary | ICD-10-CM | POA: Diagnosis not present

## 2016-09-23 DIAGNOSIS — I517 Cardiomegaly: Secondary | ICD-10-CM | POA: Diagnosis not present

## 2016-09-23 DIAGNOSIS — I4891 Unspecified atrial fibrillation: Secondary | ICD-10-CM | POA: Diagnosis present

## 2016-09-23 NOTE — Progress Notes (Signed)
  Echocardiogram 2D Echocardiogram has been performed.  Caleb Barton T Arleigh Dicola 09/23/2016, 9:10 AM

## 2016-09-29 ENCOUNTER — Encounter (HOSPITAL_COMMUNITY): Payer: Self-pay | Admitting: Nurse Practitioner

## 2016-09-29 ENCOUNTER — Other Ambulatory Visit (HOSPITAL_COMMUNITY): Payer: Self-pay | Admitting: *Deleted

## 2016-09-29 ENCOUNTER — Ambulatory Visit (HOSPITAL_COMMUNITY)
Admission: RE | Admit: 2016-09-29 | Discharge: 2016-09-29 | Disposition: A | Discharge status: Home / Self Care | Payer: Medicare HMO | Source: Ambulatory Visit | Attending: Nurse Practitioner | Admitting: Nurse Practitioner

## 2016-09-29 VITALS — BP 120/66 | HR 61 | Ht 69.0 in | Wt 221.4 lb

## 2016-09-29 DIAGNOSIS — I4510 Unspecified right bundle-branch block: Secondary | ICD-10-CM | POA: Diagnosis not present

## 2016-09-29 DIAGNOSIS — Z9889 Other specified postprocedural states: Secondary | ICD-10-CM | POA: Insufficient documentation

## 2016-09-29 DIAGNOSIS — Z96651 Presence of right artificial knee joint: Secondary | ICD-10-CM | POA: Insufficient documentation

## 2016-09-29 DIAGNOSIS — G4730 Sleep apnea, unspecified: Secondary | ICD-10-CM | POA: Diagnosis not present

## 2016-09-29 DIAGNOSIS — E7800 Pure hypercholesterolemia, unspecified: Secondary | ICD-10-CM | POA: Insufficient documentation

## 2016-09-29 DIAGNOSIS — I10 Essential (primary) hypertension: Secondary | ICD-10-CM | POA: Insufficient documentation

## 2016-09-29 DIAGNOSIS — I4891 Unspecified atrial fibrillation: Secondary | ICD-10-CM | POA: Insufficient documentation

## 2016-09-29 DIAGNOSIS — Z823 Family history of stroke: Secondary | ICD-10-CM | POA: Diagnosis not present

## 2016-09-29 DIAGNOSIS — Z8673 Personal history of transient ischemic attack (TIA), and cerebral infarction without residual deficits: Secondary | ICD-10-CM | POA: Diagnosis not present

## 2016-09-29 DIAGNOSIS — Z882 Allergy status to sulfonamides status: Secondary | ICD-10-CM | POA: Insufficient documentation

## 2016-09-29 DIAGNOSIS — Z888 Allergy status to other drugs, medicaments and biological substances status: Secondary | ICD-10-CM | POA: Insufficient documentation

## 2016-09-29 DIAGNOSIS — Z859 Personal history of malignant neoplasm, unspecified: Secondary | ICD-10-CM | POA: Insufficient documentation

## 2016-09-29 DIAGNOSIS — Z7902 Long term (current) use of antithrombotics/antiplatelets: Secondary | ICD-10-CM | POA: Insufficient documentation

## 2016-09-29 DIAGNOSIS — Z87891 Personal history of nicotine dependence: Secondary | ICD-10-CM | POA: Diagnosis not present

## 2016-09-29 DIAGNOSIS — H269 Unspecified cataract: Secondary | ICD-10-CM | POA: Diagnosis not present

## 2016-09-29 DIAGNOSIS — I481 Persistent atrial fibrillation: Secondary | ICD-10-CM

## 2016-09-29 DIAGNOSIS — I4819 Other persistent atrial fibrillation: Secondary | ICD-10-CM

## 2016-09-29 NOTE — Patient Instructions (Signed)
Cardioversion scheduled for Tuesday, June 26th  -Come to afib clinic at 1145am for [place] at the [facility] and go to admitting at 12:00pm  -Do not eat or drink anything after midnight the night prior to your procedure.  - Take all your medication with a sip of water prior to arrival.  - You will not be able to drive home after your procedure.

## 2016-09-29 NOTE — Addendum Note (Signed)
Encounter addended by: Sherran Needs, NP on: 09/29/2016  5:19 PM<BR>    Actions taken: Visit diagnoses modified

## 2016-09-29 NOTE — Progress Notes (Addendum)
Primary Care Physician: [name], MD Referring Physician:   CLAYBORN Barton is a 76 y.o. male with a h/o HTN, remote TIA, sleep apnea treatedwith cpap, that presented for an colonoscopy 5/21 am and was  found to be in rate controlled afib. Pt was unaware. Procedure was cancelled and he was asked to f/u in afib clinic. Ekg still shows afib, rate controlled and pt is asymptomatic. He did note some heart fluttering 3 weeks ago.  He denies a bleeding history. He is sedentary and obese. He does use cpap nightly. No tobacco use, minimal caffeine alcohol use. He states that he has had RBBB for a number of years  F/u in afib clinic after being stated on xarelto 20 mg daily since 5/21. He still states that afib is not bothering him but would like to pursue cardioversion to try to restore SR. Echo showed normal ef, no valvular issues. He is going on vacation next week and this be will scheduled on return.He is rate controlled.  Today, he denies symptoms of palpitations, chest pain, shortness of breath, orthopnea, PND, lower extremity edema, dizziness, presyncope, syncope, or neurologic sequela. The patient is tolerating medications without difficulties and is otherwise without complaint today.   Past Medical History:  Diagnosis Date  . 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 Dysrhythmia    RBBB  . Hypercholesteremia    takes Lipitor daily and niacin daily  . Hypertension    takes HCTZ daily and losartan daily  . Nocturia   . Right knee DJD   . Sleep apnea    cpap;sleep study done at home;request report from dr.gates  . Stroke [facility])    TIA 15 yrs ago   Past Surgical History:  Procedure Laterality Date  . cataracts both eyes     ioc lens  . COLONOSCOPY     with polyps  . COLONOSCOPY  08/20/2011   Procedure: COLONOSCOPY;  Surgeon: Winfield Cunas., MD;  Location: [facility];  Service: Endoscopy;  Laterality: N/A;  . EYE SURGERY     laser bilateral eyes  . KNEE  ARTHROSCOPY  x 2 right knee  . LUMBAR LAMINECTOMY/DECOMPRESSION MICRODISCECTOMY Right 09/09/2012   Procedure: LUMBAR LAMINECTOMY/DECOMPRESSION MICRODISCECTOMY 1 LEVEL;  Surgeon: Eustace Moore, MD;  Location: [facility];  Service: Neurosurgery;  Laterality: Right;  LUMBAR LAMINECTOMY/DECOMPRESSION MICRODISCECTOMY 1 LEVEL  . TONSILLECTOMY    . TOTAL KNEE ARTHROPLASTY  02/09/2012   Procedure: TOTAL KNEE ARTHROPLASTY;  Surgeon: Lorn Junes, MD;  Location: [facility];  Service: Orthopedics;  Laterality: Right;    Current Outpatient Prescriptions  Medication Sig Dispense Refill  . acetaminophen (TYLENOL) 500 MG tablet Take 1,500 mg by mouth 2 (two) times daily as needed for moderate pain or headache.    [name] amoxicillin (AMOXIL) 250 MG capsule Take 1,000 mg by mouth. 1 hour prior to dental appointments    . atorvastatin (LIPITOR) 40 MG tablet Take 40 mg by mouth daily.    [name] docusate sodium (COLACE) 100 MG capsule Take 100 mg by mouth daily as needed for moderate constipation.     . hydrochlorothiazide (HYDRODIURIL) 25 MG tablet Take 25 mg by mouth daily.    [name] latanoprost (XALATAN) 0.005 % ophthalmic solution Place 1 drop into both eyes at bedtime.     [name] losartan (COZAAR) 50 MG tablet Take 50 mg by mouth daily.    . Multiple Vitamin (MULTIVITAMIN WITH MINERALS) TABS tablet Take 1 tablet  by mouth daily.    . niacin 500 MG tablet Take 500 mg by mouth daily.     . Potassium 99 MG TABS Take 99 mg by mouth daily.    . rivaroxaban (XARELTO) 20 MG TABS tablet Take 1 tablet (20 mg total) by mouth daily with supper. 30 tablet 0  . timolol (TIMOPTIC) 0.25 % ophthalmic solution Place 1 drop into both eyes 2 (two) times daily.      No current facility-administered medications for this encounter.     Allergies  Allergen Reactions  . Lisinopril     COUGH   . Sulfa Antibiotics Rash    Social History   Social History  . Marital status: Married    Spouse name: N/A  . Number of children: N/A  . Years of  education: N/A   Occupational History  . Not on file.   Social History Main Topics  . Smoking status: Former Research scientist (life sciences)  . Smokeless tobacco: Never Used     Comment: quit 35 yrs ago  . Alcohol use Yes     Comment: social  . Drug use: No  . Sexual activity: Yes   Other Topics Concern  . Not on file   Social History Narrative  . No narrative on file    Family History  Problem Relation Age of Onset  . Stroke Mother     ROS- All systems are reviewed and negative except as per the HPI above  Physical Exam: Vitals:   09/29/16 0930  BP: 120/66  Pulse: 61  Weight: 221 lb 6.4 oz (100.4 kg)  Height: 5\' 9"  (1.753 m)   Wt Readings from Last 3 Encounters:  09/29/16 221 lb 6.4 oz (100.4 kg)  09/08/16 227 lb 6.4 oz (103.1 kg)  09/08/16 224 lb (101.6 kg)    Labs: Lab Results  Component Value Date   NA 139 09/06/2012   K 3.5 09/06/2012   CL 100 09/06/2012   CO2 28 09/06/2012   GLUCOSE 86 09/06/2012   BUN 14 09/06/2012   CREATININE 0.84 09/06/2012   CALCIUM 9.2 09/06/2012   Lab Results  Component Value Date   INR 0.85 09/09/2012   No results found for: CHOL, HDL, LDLCALC, TRIG   GEN- The patient is well appearing, alert and oriented x 3 today.   Head- normocephalic, atraumatic Eyes-  Sclera clear, conjunctiva pink Ears- hearing intact Oropharynx- clear Neck- supple, no JVP Lymph- no cervical lymphadenopathy Lungs- Clear to ausculation bilaterally, normal work of breathing Heart- irregular rate and rhythm, no murmurs, rubs or gallops, PMI not laterally displaced GI- soft, NT, ND, + BS Extremities- no clubbing, cyanosis, or edema MS- no significant deformity or atrophy Skin- no rash or lesion Psych- euthymic mood, full affect Neuro- strength and sensation are intact  EKG- afib at 61 bpm, qrs int 144 ms, qtc 434 ms, RBBB, LAFB, ** Bifascicular block** Epic records reviewed Echo-Study Conclusions  - Left ventricle: The cavity size was normal. Systolic function  was   normal. The estimated ejection fraction was in the range of 55%   to 60%. Wall motion was normal; there were no regional wall   motion abnormalities. - Mitral valve: Calcified annulus. - Left atrium: The atrium was mildly dilated. - Atrial septum: No defect or patent foramen ovale was identified.  -------------------------------------------------------------------     Assessment and Plan: 1. Persisitent Afib, new onset, asymptomatic He is rate controlled and not on  rate control agents due to slow v response Chads vasc  score of at least 14 (age. TIA x 2, htn), on xarelto 20 mg daily, reminded not to miss doses of his anticoagulation Will plan on cardioversion after his return from vacation on 6/26  F/u in 1 week f/u cardioversion  Addendum - 6/26- Pt is in for stat labs for pending cardioversion this am. States no missed dosed of xarelto. To endo suite.  Geroge Baseman [employer], [facility] [address] [place], [place] [address] [phone]

## 2016-10-14 ENCOUNTER — Encounter (HOSPITAL_COMMUNITY): Payer: Self-pay | Admitting: *Deleted

## 2016-10-14 ENCOUNTER — Ambulatory Visit (HOSPITAL_COMMUNITY)
Admission: RE | Admit: 2016-10-14 | Discharge: 2016-10-14 | Disposition: A | Discharge status: Home / Self Care | Payer: Medicare HMO | Source: Ambulatory Visit | Attending: Nurse Practitioner | Admitting: Nurse Practitioner

## 2016-10-14 ENCOUNTER — Ambulatory Visit (HOSPITAL_COMMUNITY): Payer: Medicare HMO | Admitting: Certified Registered"

## 2016-10-14 ENCOUNTER — Other Ambulatory Visit: Payer: Self-pay

## 2016-10-14 ENCOUNTER — Encounter (HOSPITAL_COMMUNITY)
Admission: RE | Disposition: A | Discharge status: Home / Self Care | Payer: Self-pay | Source: Ambulatory Visit | Attending: Cardiology

## 2016-10-14 ENCOUNTER — Ambulatory Visit (HOSPITAL_COMMUNITY)
Admission: RE | Admit: 2016-10-14 | Discharge: 2016-10-14 | Disposition: A | Discharge status: Home / Self Care | Payer: Medicare HMO | Source: Ambulatory Visit | Attending: Cardiology | Admitting: Cardiology

## 2016-10-14 DIAGNOSIS — I480 Paroxysmal atrial fibrillation: Secondary | ICD-10-CM | POA: Diagnosis not present

## 2016-10-14 DIAGNOSIS — Z888 Allergy status to other drugs, medicaments and biological substances status: Secondary | ICD-10-CM | POA: Insufficient documentation

## 2016-10-14 DIAGNOSIS — Z8673 Personal history of transient ischemic attack (TIA), and cerebral infarction without residual deficits: Secondary | ICD-10-CM | POA: Diagnosis not present

## 2016-10-14 DIAGNOSIS — E669 Obesity, unspecified: Secondary | ICD-10-CM | POA: Insufficient documentation

## 2016-10-14 DIAGNOSIS — Z7901 Long term (current) use of anticoagulants: Secondary | ICD-10-CM | POA: Insufficient documentation

## 2016-10-14 DIAGNOSIS — I481 Persistent atrial fibrillation: Secondary | ICD-10-CM | POA: Diagnosis not present

## 2016-10-14 DIAGNOSIS — E7800 Pure hypercholesterolemia, unspecified: Secondary | ICD-10-CM | POA: Diagnosis not present

## 2016-10-14 DIAGNOSIS — Z6832 Body mass index (BMI) 32.0-32.9, adult: Secondary | ICD-10-CM | POA: Diagnosis not present

## 2016-10-14 DIAGNOSIS — I499 Cardiac arrhythmia, unspecified: Secondary | ICD-10-CM | POA: Diagnosis not present

## 2016-10-14 DIAGNOSIS — G4730 Sleep apnea, unspecified: Secondary | ICD-10-CM | POA: Insufficient documentation

## 2016-10-14 DIAGNOSIS — Z882 Allergy status to sulfonamides status: Secondary | ICD-10-CM | POA: Diagnosis not present

## 2016-10-14 DIAGNOSIS — Z96651 Presence of right artificial knee joint: Secondary | ICD-10-CM | POA: Diagnosis not present

## 2016-10-14 DIAGNOSIS — I10 Essential (primary) hypertension: Secondary | ICD-10-CM | POA: Diagnosis not present

## 2016-10-14 DIAGNOSIS — Z87891 Personal history of nicotine dependence: Secondary | ICD-10-CM | POA: Insufficient documentation

## 2016-10-14 DIAGNOSIS — I4891 Unspecified atrial fibrillation: Secondary | ICD-10-CM | POA: Diagnosis not present

## 2016-10-14 DIAGNOSIS — Z79899 Other long term (current) drug therapy: Secondary | ICD-10-CM | POA: Insufficient documentation

## 2016-10-14 HISTORY — PX: CARDIOVERSION: SHX1299

## 2016-10-14 LAB — CBC
HCT: 42.7 % (ref 39.0–52.0)
Hemoglobin: 14.5 g/dL (ref 13.0–17.0)
MCH: 31.9 pg (ref 26.0–34.0)
MCHC: 34 g/dL (ref 30.0–36.0)
MCV: 93.8 fL (ref 78.0–100.0)
Platelets: 179 10*3/uL (ref 150–400)
RBC: 4.55 MIL/uL (ref 4.22–5.81)
RDW: 13.2 % (ref 11.5–15.5)
WBC: 4.7 10*3/uL (ref 4.0–10.5)

## 2016-10-14 LAB — BASIC METABOLIC PANEL
Anion gap: 4 — ABNORMAL LOW (ref 5–15)
BUN: 12 mg/dL (ref 6–20)
CALCIUM: 8.9 mg/dL (ref 8.9–10.3)
CO2: 30 mmol/L (ref 22–32)
Chloride: 107 mmol/L (ref 101–111)
Creatinine, Ser: 0.84 mg/dL (ref 0.61–1.24)
GFR calc non Af Amer: >60 mL/min (ref >60)
Glucose, Bld: 92 mg/dL (ref 65–99)
Potassium: 3.7 mmol/L (ref 3.5–5.1)
SODIUM: 141 mmol/L (ref 135–145)

## 2016-10-14 SURGERY — CARDIOVERSION
Anesthesia: General

## 2016-10-14 MED ORDER — PROPOFOL 10 MG/ML IV BOLUS
INTRAVENOUS | Status: DC | PRN
  Administered 2016-10-14 (×2): 70 mg via INTRAVENOUS

## 2016-10-14 MED ORDER — LIDOCAINE HCL (CARDIAC) 20 MG/ML IV SOLN
INTRAVENOUS | Status: DC | PRN
  Administered 2016-10-14: 60 mg via INTRAVENOUS

## 2016-10-14 MED ORDER — SODIUM CHLORIDE 0.9 % IV SOLN
INTRAVENOUS | Status: DC
  Administered 2016-10-14: 13:00:00 via INTRAVENOUS

## 2016-10-14 NOTE — CV Procedure (Signed)
Indication: Atrial fib  Procedure: The risks, benefits, complications, treatment options, and expected outcomes were discussed with the patient. The patient and/or family concurred with the proposed plan, giving informed consent.   The patient was supine. Anesthesia at bedside. Propofol given for anesthesia. Baseline EKG with atrial fib.   Pt sedated. 150 J biphasic energy delivered. He did not convert to sinus 200 J biphasic delivered. He converted to sinus.  EKG confirmed sinus post DCCV  No complications.   Conclusion: Successful cardioversion to sinus rhythm.

## 2016-10-14 NOTE — H&P (View-Only) (Signed)
Primary Care Physician: [name], MD Referring Physician:   DAEGAN ARIZMENDI is a 76 y.o. male with a h/o HTN, remote TIA, sleep apnea treatedwith cpap, that presented for an colonoscopy 5/21 am and was  found to be in rate controlled afib. Pt was unaware. Procedure was cancelled and he was asked to f/u in afib clinic. Ekg still shows afib, rate controlled and pt is asymptomatic. He did note some heart fluttering 3 weeks ago.  He denies a bleeding history. He is sedentary and obese. He does use cpap nightly. No tobacco use, minimal caffeine alcohol use. He states that he has had RBBB for a number of years  F/u in afib clinic after being stated on xarelto 20 mg daily since 5/21. He still states that afib is not bothering him but would like to pursue cardioversion to try to restore SR. Echo showed normal ef, no valvular issues. He is going on vacation next week and this be will scheduled on return.He is rate controlled.  Today, he denies symptoms of palpitations, chest pain, shortness of breath, orthopnea, PND, lower extremity edema, dizziness, presyncope, syncope, or neurologic sequela. The patient is tolerating medications without difficulties and is otherwise without complaint today.   Past Medical History:  Diagnosis Date  . 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 Dysrhythmia    RBBB  . Hypercholesteremia    takes Lipitor daily and niacin daily  . Hypertension    takes HCTZ daily and losartan daily  . Nocturia   . Right knee DJD   . Sleep apnea    cpap;sleep study done at home;request report from dr.gates  . Stroke [facility])    TIA 15 yrs ago   Past Surgical History:  Procedure Laterality Date  . cataracts both eyes     ioc lens  . COLONOSCOPY     with polyps  . COLONOSCOPY  08/20/2011   Procedure: COLONOSCOPY;  Surgeon: Winfield Cunas., MD;  Location: [facility];  Service: Endoscopy;  Laterality: N/A;  . EYE SURGERY     laser bilateral eyes  . KNEE  ARTHROSCOPY  x 2 right knee  . LUMBAR LAMINECTOMY/DECOMPRESSION MICRODISCECTOMY Right 09/09/2012   Procedure: LUMBAR LAMINECTOMY/DECOMPRESSION MICRODISCECTOMY 1 LEVEL;  Surgeon: Eustace Moore, MD;  Location: [facility];  Service: Neurosurgery;  Laterality: Right;  LUMBAR LAMINECTOMY/DECOMPRESSION MICRODISCECTOMY 1 LEVEL  . TONSILLECTOMY    . TOTAL KNEE ARTHROPLASTY  02/09/2012   Procedure: TOTAL KNEE ARTHROPLASTY;  Surgeon: Lorn Junes, MD;  Location: [facility];  Service: Orthopedics;  Laterality: Right;    Current Outpatient Prescriptions  Medication Sig Dispense Refill  . acetaminophen (TYLENOL) 500 MG tablet Take 1,500 mg by mouth 2 (two) times daily as needed for moderate pain or headache.    [name] amoxicillin (AMOXIL) 250 MG capsule Take 1,000 mg by mouth. 1 hour prior to dental appointments    . atorvastatin (LIPITOR) 40 MG tablet Take 40 mg by mouth daily.    [name] docusate sodium (COLACE) 100 MG capsule Take 100 mg by mouth daily as needed for moderate constipation.     . hydrochlorothiazide (HYDRODIURIL) 25 MG tablet Take 25 mg by mouth daily.    [name] latanoprost (XALATAN) 0.005 % ophthalmic solution Place 1 drop into both eyes at bedtime.     [name] losartan (COZAAR) 50 MG tablet Take 50 mg by mouth daily.    . Multiple Vitamin (MULTIVITAMIN WITH MINERALS) TABS tablet Take 1 tablet  by mouth daily.    . niacin 500 MG tablet Take 500 mg by mouth daily.     . Potassium 99 MG TABS Take 99 mg by mouth daily.    . rivaroxaban (XARELTO) 20 MG TABS tablet Take 1 tablet (20 mg total) by mouth daily with supper. 30 tablet 0  . timolol (TIMOPTIC) 0.25 % ophthalmic solution Place 1 drop into both eyes 2 (two) times daily.      No current facility-administered medications for this encounter.     Allergies  Allergen Reactions  . Lisinopril     COUGH   . Sulfa Antibiotics Rash    Social History   Social History  . Marital status: Married    Spouse name: N/A  . Number of children: N/A  . Years of  education: N/A   Occupational History  . Not on file.   Social History Main Topics  . Smoking status: Former Research scientist (life sciences)  . Smokeless tobacco: Never Used     Comment: quit 35 yrs ago  . Alcohol use Yes     Comment: social  . Drug use: No  . Sexual activity: Yes   Other Topics Concern  . Not on file   Social History Narrative  . No narrative on file    Family History  Problem Relation Age of Onset  . Stroke Mother     ROS- All systems are reviewed and negative except as per the HPI above  Physical Exam: Vitals:   09/29/16 0930  BP: 120/66  Pulse: 61  Weight: 221 lb 6.4 oz (100.4 kg)  Height: 5\' 9"  (1.753 m)   Wt Readings from Last 3 Encounters:  09/29/16 221 lb 6.4 oz (100.4 kg)  09/08/16 227 lb 6.4 oz (103.1 kg)  09/08/16 224 lb (101.6 kg)    Labs: Lab Results  Component Value Date   NA 139 09/06/2012   K 3.5 09/06/2012   CL 100 09/06/2012   CO2 28 09/06/2012   GLUCOSE 86 09/06/2012   BUN 14 09/06/2012   CREATININE 0.84 09/06/2012   CALCIUM 9.2 09/06/2012   Lab Results  Component Value Date   INR 0.85 09/09/2012   No results found for: CHOL, HDL, LDLCALC, TRIG   GEN- The patient is well appearing, alert and oriented x 3 today.   Head- normocephalic, atraumatic Eyes-  Sclera clear, conjunctiva pink Ears- hearing intact Oropharynx- clear Neck- supple, no JVP Lymph- no cervical lymphadenopathy Lungs- Clear to ausculation bilaterally, normal work of breathing Heart- irregular rate and rhythm, no murmurs, rubs or gallops, PMI not laterally displaced GI- soft, NT, ND, + BS Extremities- no clubbing, cyanosis, or edema MS- no significant deformity or atrophy Skin- no rash or lesion Psych- euthymic mood, full affect Neuro- strength and sensation are intact  EKG- afib at 61 bpm, qrs int 144 ms, qtc 434 ms, RBBB, LAFB, ** Bifascicular block** Epic records reviewed Echo-Study Conclusions  - Left ventricle: The cavity size was normal. Systolic function  was   normal. The estimated ejection fraction was in the range of 55%   to 60%. Wall motion was normal; there were no regional wall   motion abnormalities. - Mitral valve: Calcified annulus. - Left atrium: The atrium was mildly dilated. - Atrial septum: No defect or patent foramen ovale was identified.  -------------------------------------------------------------------     Assessment and Plan: 1. Persisitent Afib, new onset, asymptomatic He is rate controlled and not on  rate control agents due to slow v response Chads vasc  score of at least 50 (age. TIA x 2, htn), on xarelto 20 mg daily, reminded not to miss doses of his anticoagulation Will plan on cardioversion after his return from vacation on 6/26  F/u in 1 week f/u cardioversion  Addendum - 6/26- Pt is in for stat labs for pending cardioversion this am. States no missed dosed of xarelto. To endo suite.  Geroge Baseman Winslow Ederer, [facility] [address] [place], [place] [address] [phone]

## 2016-10-14 NOTE — Addendum Note (Signed)
Encounter addended by: Sherran Needs, NP on: 10/14/2016 11:55 AM<BR>    Actions taken: Sign clinical note

## 2016-10-14 NOTE — Interval H&P Note (Signed)
History and Physical Interval Note:  10/14/2016 2:15 PM  Caleb Barton  has presented today for elective cardioversion with the diagnosis of AFIB  The various methods of treatment have been discussed with the patient and family. After consideration of risks, benefits and other options for treatment, the patient has consented to  Procedure(s): CARDIOVERSION (N/A) as a surgical intervention .  The patient's history has been reviewed, patient examined, no change in status, stable for surgery.  I have reviewed the patient's chart and labs.  Questions were answered to the patient's satisfaction.     Lauree Chandler

## 2016-10-14 NOTE — Anesthesia Postprocedure Evaluation (Signed)
Anesthesia Post Note  Patient: Caleb Barton  Procedure(s) Performed: Procedure(s) (LRB): CARDIOVERSION (N/A)     Patient location during evaluation: PACU Anesthesia Type: General Level of consciousness: awake and alert Pain management: pain level controlled Vital Signs Assessment: post-procedure vital signs reviewed and stable Respiratory status: spontaneous breathing, nonlabored ventilation, respiratory function stable and patient connected to nasal cannula oxygen Cardiovascular status: blood pressure returned to baseline and stable Postop Assessment: no signs of nausea or vomiting Anesthetic complications: no    Last Vitals:  Vitals:   10/14/16 1438 10/14/16 1450  BP: 111/62 117/70  Pulse: (!) 58 (!) 58  Resp: 11 (!) 9  Temp: (!) 35.8 C     Last Pain:  Vitals:   10/14/16 1438  TempSrc: Oral                 Ryan P Ellender

## 2016-10-14 NOTE — Anesthesia Preprocedure Evaluation (Addendum)
Anesthesia Evaluation  Patient identified by MRN, date of birth, ID band Patient awake    Reviewed: Allergy & Precautions, H&P , NPO status , Patient's Chart, lab work & pertinent test results  Airway Mallampati: II   Neck ROM: full    Dental   Pulmonary sleep apnea , former smoker,    Pulmonary exam normal        Cardiovascular hypertension, Pt. on medications Normal cardiovascular exam+ dysrhythmias Atrial Fibrillation      Neuro/Psych TIA   GI/Hepatic   Endo/Other  obese  Renal/GU      Musculoskeletal   Abdominal (+) + obese,   Peds  Hematology   Anesthesia Other Findings Obese  Hyperlipidemia   Reproductive/Obstetrics                            Anesthesia Physical  Anesthesia Plan  ASA: III  Anesthesia Plan: General   Post-op Pain Management:    Induction: Intravenous  PONV Risk Score and Plan: Treatment may vary due to age or medical condition  Airway Management Planned: Mask  Additional Equipment:   Intra-op Plan:   Post-operative Plan:   Informed Consent: I have reviewed the patients History and Physical, chart, labs and discussed the procedure including the risks, benefits and alternatives for the proposed anesthesia with the patient or authorized representative who has indicated his/her understanding and acceptance.     Plan Discussed with: CRNA and Surgeon  Anesthesia Plan Comments:         Anesthesia Quick Evaluation

## 2016-10-14 NOTE — Transfer of Care (Signed)
Immediate Anesthesia Transfer of Care Note  Patient: [name]  Procedure(s) Performed: Procedure(s): CARDIOVERSION (N/A)  Patient Location: Endoscopy Unit  Anesthesia Type:General  Level of Consciousness: awake, alert  and oriented  Airway & Oxygen Therapy: Patient Spontanous Breathing  Post-op Assessment: Report given to RN, Post -op Vital signs reviewed and stable and Patient moving all extremities X 4  Post vital signs: Reviewed and stable  Last Vitals:  Vitals:   10/14/16 1435 10/14/16 1436  BP:  (!) 116/42  Pulse: 61 61  Resp: 16 19  Temp:      Last Pain:  Vitals:   10/14/16 1323  TempSrc: Oral         Complications: No apparent anesthesia complications

## 2016-10-21 ENCOUNTER — Ambulatory Visit (HOSPITAL_COMMUNITY)
Admission: RE | Admit: 2016-10-21 | Discharge: 2016-10-21 | Disposition: A | Discharge status: Home / Self Care | Payer: Medicare HMO | Source: Ambulatory Visit | Attending: Nurse Practitioner | Admitting: Nurse Practitioner

## 2016-10-21 ENCOUNTER — Encounter (HOSPITAL_COMMUNITY): Payer: Self-pay | Admitting: Nurse Practitioner

## 2016-10-21 VITALS — BP 122/78 | HR 60 | Ht 69.0 in | Wt 221.0 lb

## 2016-10-21 DIAGNOSIS — G4730 Sleep apnea, unspecified: Secondary | ICD-10-CM | POA: Diagnosis not present

## 2016-10-21 DIAGNOSIS — I4510 Unspecified right bundle-branch block: Secondary | ICD-10-CM | POA: Diagnosis not present

## 2016-10-21 DIAGNOSIS — I4891 Unspecified atrial fibrillation: Secondary | ICD-10-CM | POA: Insufficient documentation

## 2016-10-21 DIAGNOSIS — Z87891 Personal history of nicotine dependence: Secondary | ICD-10-CM | POA: Diagnosis not present

## 2016-10-21 DIAGNOSIS — E7800 Pure hypercholesterolemia, unspecified: Secondary | ICD-10-CM | POA: Insufficient documentation

## 2016-10-21 DIAGNOSIS — Z8673 Personal history of transient ischemic attack (TIA), and cerebral infarction without residual deficits: Secondary | ICD-10-CM | POA: Insufficient documentation

## 2016-10-21 DIAGNOSIS — I481 Persistent atrial fibrillation: Secondary | ICD-10-CM

## 2016-10-21 DIAGNOSIS — M1711 Unilateral primary osteoarthritis, right knee: Secondary | ICD-10-CM | POA: Insufficient documentation

## 2016-10-21 DIAGNOSIS — Z7901 Long term (current) use of anticoagulants: Secondary | ICD-10-CM | POA: Diagnosis not present

## 2016-10-21 DIAGNOSIS — I10 Essential (primary) hypertension: Secondary | ICD-10-CM | POA: Insufficient documentation

## 2016-10-21 DIAGNOSIS — I4819 Other persistent atrial fibrillation: Secondary | ICD-10-CM

## 2016-10-21 NOTE — Progress Notes (Addendum)
Primary Care Physician: [name], MD Referring Physician:  Dr. Helmut Muster is a 76 y.o. male with a h/o HTN, remote TIA, sleep apnea treatedwith cpap, that presented for an colonoscopy 5/21 am and was  found to be in rate controlled afib. Pt was unaware. Procedure was cancelled and he was asked to f/u in afib clinic. Ekg still shows afib, rate controlled and pt is asymptomatic. He did note some heart fluttering 3 weeks ago.  He denies a bleeding history. He is sedentary and obese. He does use cpap nightly. No tobacco use, minimal caffeine/ alcohol use. He states that he has had RBBB for a number of years  F/u in afib clinic after being stated on xarelto 20 mg daily since 5/21. He still states that afib is not bothering him but would like to pursue cardioversion to try to restore SR. Echo showed normal ef, no valvular issues. He is going on vacation next week and this be will scheduled on return.He is rate controlled.  F/u afib clinic 7/3, after cardioversion. He did convert to SR with DCCV but unfortunately has returned to Afib. He is unaware when that might have been. He could tell no difference  in afib than SR. He is asymtomatic and is doing his yard work, very active without symptoms. He continues in rate control without rate control meds. Unsure as to onset, but I feel he may have been in persisitent afib for some time. No issues with xarelto 20 mg a day.  Today, he denies symptoms of palpitations, chest pain, shortness of breath, orthopnea, PND, lower extremity edema, dizziness, presyncope, syncope, or neurologic sequela. The patient is tolerating medications without difficulties and is otherwise without complaint today.   Past Medical History:  Diagnosis Date  . 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 Dysrhythmia    RBBB  . Hypercholesteremia    takes Lipitor daily and niacin daily  . Hypertension    takes HCTZ daily and losartan daily  .  Nocturia   . Right knee DJD   . Sleep apnea    cpap;sleep study done at home;request report from dr.gates  . Stroke [facility])    TIA 15 yrs ago   Past Surgical History:  Procedure Laterality Date  . CARDIOVERSION N/A 10/14/2016   Procedure: CARDIOVERSION;  Surgeon: Larey Dresser, MD;  Location: [facility] ENDOSCOPY;  Service: Cardiovascular;  Laterality: N/A;  . cataracts both eyes     ioc lens  . COLONOSCOPY     with polyps  . COLONOSCOPY  08/20/2011   Procedure: COLONOSCOPY;  Surgeon: Winfield Cunas., MD;  Location: [facility] ENDOSCOPY;  Service: Endoscopy;  Laterality: N/A;  . EYE SURGERY     laser bilateral eyes  . KNEE ARTHROSCOPY  x 2 right knee  . LUMBAR LAMINECTOMY/DECOMPRESSION MICRODISCECTOMY Right 09/09/2012   Procedure: LUMBAR LAMINECTOMY/DECOMPRESSION MICRODISCECTOMY 1 LEVEL;  Surgeon: Eustace Moore, MD;  Location: [place] NEURO ORS;  Service: Neurosurgery;  Laterality: Right;  LUMBAR LAMINECTOMY/DECOMPRESSION MICRODISCECTOMY 1 LEVEL  . TONSILLECTOMY    . TOTAL KNEE ARTHROPLASTY  02/09/2012   Procedure: TOTAL KNEE ARTHROPLASTY;  Surgeon: Lorn Junes, MD;  Location: [place];  Service: Orthopedics;  Laterality: Right;    Current Outpatient Prescriptions  Medication Sig Dispense Refill  . acetaminophen (TYLENOL) 500 MG tablet Take 1,500 mg by mouth 2 (two) times daily as needed for moderate pain or headache.    [name] amoxicillin (AMOXIL) 250  MG capsule Take 1,000 mg by mouth. 1 hour prior to dental appointments    . atorvastatin (LIPITOR) 40 MG tablet Take 40 mg by mouth daily.    [name] docusate sodium (COLACE) 100 MG capsule Take 100 mg by mouth daily as needed for moderate constipation.     . hydrochlorothiazide (HYDRODIURIL) 25 MG tablet Take 25 mg by mouth daily.    [name] latanoprost (XALATAN) 0.005 % ophthalmic solution Place 1 drop into both eyes at bedtime.     [name] losartan (COZAAR) 50 MG tablet Take 50 mg by mouth daily.    . Multiple Vitamin (MULTIVITAMIN WITH MINERALS) TABS tablet Take 1  tablet by mouth daily.    . niacin 500 MG tablet Take 500 mg by mouth daily.     . Potassium 99 MG TABS Take 99 mg by mouth daily.    . rivaroxaban (XARELTO) 20 MG TABS tablet Take 1 tablet (20 mg total) by mouth daily with supper. 30 tablet 0  . timolol (TIMOPTIC) 0.25 % ophthalmic solution Place 1 drop into both eyes 2 (two) times daily.      No current facility-administered medications for this encounter.     Allergies  Allergen Reactions  . Lisinopril     COUGH   . Sulfa Antibiotics Rash    Social History   Social History  . Marital status: Married    Spouse name: N/A  . Number of children: N/A  . Years of education: N/A   Occupational History  . Not on file.   Social History Main Topics  . Smoking status: Former Research scientist (life sciences)  . Smokeless tobacco: Never Used     Comment: quit 35 yrs ago  . Alcohol use Yes     Comment: social  . Drug use: No  . Sexual activity: Yes   Other Topics Concern  . Not on file   Social History Narrative  . No narrative on file    Family History  Problem Relation Age of Onset  . Stroke Mother     ROS- All systems are reviewed and negative except as per the HPI above  Physical Exam: Vitals:   10/21/16 0858  BP: 122/78  Pulse: 60  Weight: 221 lb (100.2 kg)  Height: 5\' 9"  (1.753 m)   Wt Readings from Last 3 Encounters:  10/21/16 221 lb (100.2 kg)  10/14/16 221 lb (100.2 kg)  09/29/16 221 lb 6.4 oz (100.4 kg)    Labs: Lab Results  Component Value Date   NA 141 10/14/2016   K 3.7 10/14/2016   CL 107 10/14/2016   CO2 30 10/14/2016   GLUCOSE 92 10/14/2016   BUN 12 10/14/2016   CREATININE 0.84 10/14/2016   CALCIUM 8.9 10/14/2016   Lab Results  Component Value Date   INR 0.85 09/09/2012   No results found for: CHOL, HDL, LDLCALC, TRIG   GEN- The patient is well appearing, alert and oriented x 3 today.   Head- normocephalic, atraumatic Eyes-  Sclera clear, conjunctiva pink Ears- hearing intact Oropharynx- clear Neck-  supple, no JVP Lymph- no cervical lymphadenopathy Lungs- Clear to ausculation bilaterally, normal work of breathing Heart- irregular rate and rhythm, no murmurs, rubs or gallops, PMI not laterally displaced GI- soft, NT, ND, + BS Extremities- no clubbing, cyanosis, or edema MS- no significant deformity or atrophy Skin- no rash or lesion Psych- euthymic mood, full affect Neuro- strength and sensation are intact  EKG- afib at 60 bpm, qrs int 132 ms, qtc 430 ms,  RBBB, LAFB, ** Bifascicular block** Epic records reviewed Echo-Study Conclusions  - Left ventricle: The cavity size was normal. Systolic function was   normal. The estimated ejection fraction was in the range of 55%   to 60%. Wall motion was normal; there were no regional wall   motion abnormalities. - Mitral valve: Calcified annulus. - Left atrium: The atrium was mildly dilated. - Atrial septum: No defect or patent foramen ovale was identified.  -------------------------------------------------------------------     Assessment and Plan: 1. Persisitent Afib, new dx,  onset unknown, asymptomatic Successful cardioversion but ERAF Pt felt no different in SR as afib Discussed options but with pt being asymptomatic with normal LV function, do not see the benefit of  pursuing SR, pt is in agreement He is rate controlled without chronotropic drugs on board  Chads vasc score of at least 4 (age, TIA x 2, htn), on xarelto 20 mg daily    Will refer to establish with cardiology  afib clinic as needed Pt wants to continue with plans for colonoscopy, I think he can do so with low risk, he will need to hold DOAC 2 days before procedure  Butch Penny C. Carroll, [facility] [address] [place], [place] [address] [phone]

## 2016-10-27 ENCOUNTER — Other Ambulatory Visit (HOSPITAL_COMMUNITY): Payer: Self-pay | Admitting: Nurse Practitioner

## 2016-11-12 DIAGNOSIS — R69 Illness, unspecified: Secondary | ICD-10-CM | POA: Diagnosis not present

## 2016-11-18 DIAGNOSIS — M278 Other specified diseases of jaws: Secondary | ICD-10-CM | POA: Diagnosis not present

## 2016-11-19 DIAGNOSIS — I739 Peripheral vascular disease, unspecified: Secondary | ICD-10-CM | POA: Diagnosis not present

## 2016-11-19 DIAGNOSIS — I10 Essential (primary) hypertension: Secondary | ICD-10-CM | POA: Diagnosis not present

## 2016-11-19 DIAGNOSIS — E669 Obesity, unspecified: Secondary | ICD-10-CM | POA: Diagnosis not present

## 2016-11-19 DIAGNOSIS — E785 Hyperlipidemia, unspecified: Secondary | ICD-10-CM | POA: Diagnosis not present

## 2016-11-19 DIAGNOSIS — I6930 Unspecified sequelae of cerebral infarction: Secondary | ICD-10-CM | POA: Diagnosis not present

## 2016-11-19 DIAGNOSIS — Z6833 Body mass index (BMI) 33.0-33.9, adult: Secondary | ICD-10-CM | POA: Diagnosis not present

## 2016-11-19 DIAGNOSIS — I482 Chronic atrial fibrillation: Secondary | ICD-10-CM | POA: Diagnosis not present

## 2016-11-25 DIAGNOSIS — M278 Other specified diseases of jaws: Secondary | ICD-10-CM | POA: Diagnosis not present

## 2016-12-01 ENCOUNTER — Other Ambulatory Visit: Payer: Self-pay | Admitting: Gastroenterology

## 2016-12-01 DIAGNOSIS — I482 Chronic atrial fibrillation: Secondary | ICD-10-CM | POA: Diagnosis not present

## 2016-12-01 DIAGNOSIS — Z86010 Personal history of colonic polyps: Secondary | ICD-10-CM | POA: Diagnosis not present

## 2016-12-01 DIAGNOSIS — Z7901 Long term (current) use of anticoagulants: Secondary | ICD-10-CM | POA: Diagnosis not present

## 2016-12-04 ENCOUNTER — Encounter (HOSPITAL_COMMUNITY): Payer: Self-pay | Admitting: *Deleted

## 2016-12-04 NOTE — Progress Notes (Signed)
Spoke with dr germeroth anesthesia and made aware ekg 10-21-16 results, ok per dr germeroth to use 10-21-16 ekg for 8-29 18 colonscopy

## 2016-12-12 ENCOUNTER — Telehealth: Payer: Self-pay

## 2016-12-12 NOTE — Telephone Encounter (Signed)
Dr.Jordan advised ok to hold Xarelto 48 hours prior to colonoscopy.Faxed back to [facility]. fax # [phone].

## 2016-12-17 ENCOUNTER — Ambulatory Visit (HOSPITAL_COMMUNITY): Payer: Medicare HMO | Admitting: Anesthesiology

## 2016-12-17 ENCOUNTER — Encounter (HOSPITAL_COMMUNITY)
Admission: RE | Disposition: A | Discharge status: Home / Self Care | Payer: Self-pay | Source: Ambulatory Visit | Attending: Gastroenterology

## 2016-12-17 ENCOUNTER — Encounter (HOSPITAL_COMMUNITY): Payer: Self-pay | Admitting: Gastroenterology

## 2016-12-17 ENCOUNTER — Ambulatory Visit (HOSPITAL_COMMUNITY)
Admission: RE | Admit: 2016-12-17 | Discharge: 2016-12-17 | Disposition: A | Discharge status: Home / Self Care | Payer: Medicare HMO | Source: Ambulatory Visit | Attending: Gastroenterology | Admitting: Gastroenterology

## 2016-12-17 DIAGNOSIS — K640 First degree hemorrhoids: Secondary | ICD-10-CM | POA: Diagnosis not present

## 2016-12-17 DIAGNOSIS — I10 Essential (primary) hypertension: Secondary | ICD-10-CM | POA: Diagnosis not present

## 2016-12-17 DIAGNOSIS — Z79899 Other long term (current) drug therapy: Secondary | ICD-10-CM | POA: Insufficient documentation

## 2016-12-17 DIAGNOSIS — E785 Hyperlipidemia, unspecified: Secondary | ICD-10-CM | POA: Diagnosis not present

## 2016-12-17 DIAGNOSIS — I4891 Unspecified atrial fibrillation: Secondary | ICD-10-CM | POA: Diagnosis not present

## 2016-12-17 DIAGNOSIS — Z87891 Personal history of nicotine dependence: Secondary | ICD-10-CM | POA: Insufficient documentation

## 2016-12-17 DIAGNOSIS — G4730 Sleep apnea, unspecified: Secondary | ICD-10-CM | POA: Diagnosis not present

## 2016-12-17 DIAGNOSIS — Z8673 Personal history of transient ischemic attack (TIA), and cerebral infarction without residual deficits: Secondary | ICD-10-CM | POA: Insufficient documentation

## 2016-12-17 DIAGNOSIS — E7800 Pure hypercholesterolemia, unspecified: Secondary | ICD-10-CM | POA: Insufficient documentation

## 2016-12-17 DIAGNOSIS — Z86010 Personal history of colonic polyps: Secondary | ICD-10-CM | POA: Diagnosis not present

## 2016-12-17 DIAGNOSIS — Z1211 Encounter for screening for malignant neoplasm of colon: Secondary | ICD-10-CM | POA: Insufficient documentation

## 2016-12-17 DIAGNOSIS — Z7901 Long term (current) use of anticoagulants: Secondary | ICD-10-CM | POA: Diagnosis not present

## 2016-12-17 DIAGNOSIS — D120 Benign neoplasm of cecum: Secondary | ICD-10-CM | POA: Insufficient documentation

## 2016-12-17 HISTORY — PX: COLONOSCOPY WITH PROPOFOL: SHX5780

## 2016-12-17 SURGERY — COLONOSCOPY WITH PROPOFOL
Anesthesia: Monitor Anesthesia Care

## 2016-12-17 MED ORDER — LIDOCAINE 2% (20 MG/ML) 5 ML SYRINGE
INTRAMUSCULAR | Status: DC | PRN
  Administered 2016-12-17: 100 mg via INTRAVENOUS

## 2016-12-17 MED ORDER — LIDOCAINE 2% (20 MG/ML) 5 ML SYRINGE
INTRAMUSCULAR | Status: AC
  Filled 2016-12-17: qty 5

## 2016-12-17 MED ORDER — PROPOFOL 10 MG/ML IV BOLUS
INTRAVENOUS | Status: AC
  Filled 2016-12-17: qty 40

## 2016-12-17 MED ORDER — PROPOFOL 10 MG/ML IV BOLUS
INTRAVENOUS | Status: AC
  Filled 2016-12-17: qty 20

## 2016-12-17 MED ORDER — SODIUM CHLORIDE 0.9 % IV SOLN: INTRAVENOUS | Status: DC

## 2016-12-17 MED ORDER — PROPOFOL 10 MG/ML IV BOLUS
INTRAVENOUS | Status: DC | PRN
  Administered 2016-12-17 (×4): 20 mg via INTRAVENOUS

## 2016-12-17 MED ORDER — PROPOFOL 500 MG/50ML IV EMUL
INTRAVENOUS | Status: DC | PRN
  Administered 2016-12-17: 150 ug/kg/min via INTRAVENOUS

## 2016-12-17 MED ORDER — LACTATED RINGERS IV SOLN
INTRAVENOUS | Status: DC
  Administered 2016-12-17: 11:00:00 via INTRAVENOUS

## 2016-12-17 SURGICAL SUPPLY — 21 items

## 2016-12-17 NOTE — Discharge Instructions (Signed)
Colonoscopy, Adult, Care After This sheet gives you information about how to care for yourself after your procedure. Your doctor may also give you more specific instructions. If you have problems or questions, call your doctor. Follow these instructions at home: General instructions   For the first 24 hours after the procedure: ? Do not drive or use machinery. ? Do not sign important documents. ? Do not drink alcohol. ? Do your daily activities more slowly than normal. ? Eat foods that are soft and easy to digest. ? Rest often.  Take over-the-counter or prescription medicines only as told by your doctor.  It is up to you to get the results of your procedure. Ask your doctor, or the department performing the procedure, when your results will be ready. To help cramping and bloating:  Try walking around.  Put heat on your belly (abdomen) as told by your doctor. Use a heat source that your doctor recommends, such as a moist heat pack or a heating pad. ? Put a towel between your skin and the heat source. ? Leave the heat on for 20-30 minutes. ? Remove the heat if your skin turns bright red. This is especially important if you cannot feel pain, heat, or cold. You can get burned. Eating and drinking  Drink enough fluid to keep your pee (urine) clear or pale yellow.  Return to your normal diet as told by your doctor. Avoid heavy or fried foods that are hard to digest.  Avoid drinking alcohol for as long as told by your doctor. Contact a doctor if:  You have blood in your poop (stool) 2-3 days after the procedure. Get help right away if:  You have more than a small amount of blood in your poop.  You see large clumps of tissue (blood clots) in your poop.  Your belly is swollen.  You feel sick to your stomach (nauseous).  You throw up (vomit).  You have a fever.  You have belly pain that gets worse, and medicine does not help your pain. This information is not intended to  replace advice given to you by your health care provider. Make sure you discuss any questions you have with your health care provider. Document Released: 05/10/2010 Document Revised: 12/31/2015 Document Reviewed: 12/31/2015 Elsevier Interactive Patient Education  2017 [place].     No aspirin, ibuprofen or other NSAID medications for 5 days. Office will send note or call when pathology results are obtained. Colonoscopy will be repeated based on the pathology results.  Resume your Xarelto 5 days from now.

## 2016-12-17 NOTE — Anesthesia Preprocedure Evaluation (Addendum)
Anesthesia Evaluation  Patient identified by MRN, date of birth, ID band Patient awake    Reviewed: Allergy & Precautions, H&P , NPO status , Patient's Chart, lab work & pertinent test results  Airway Mallampati: II   Neck ROM: full    Dental   Pulmonary sleep apnea and Continuous Positive Airway Pressure Ventilation , former smoker,    Pulmonary exam normal        Cardiovascular hypertension, Pt. on medications Normal cardiovascular exam+ dysrhythmias Atrial Fibrillation      Neuro/Psych TIA   GI/Hepatic History of colon polyps   Endo/Other  obese  Renal/GU      Musculoskeletal   Abdominal (+) + obese,   Peds  Hematology   Anesthesia Other Findings Obese  Hyperlipidemia   Reproductive/Obstetrics                            Anesthesia Physical  Anesthesia Plan  ASA: III  Anesthesia Plan: MAC   Post-op Pain Management:    Induction: Intravenous  PONV Risk Score and Plan: Treatment may vary due to age or medical condition and Propofol infusion  Airway Management Planned: Natural Airway  Additional Equipment:   Intra-op Plan:   Post-operative Plan:   Informed Consent: I have reviewed the patients History and Physical, chart, labs and discussed the procedure including the risks, benefits and alternatives for the proposed anesthesia with the patient or authorized representative who has indicated his/her understanding and acceptance.     Plan Discussed with: CRNA  Anesthesia Plan Comments:         Anesthesia Quick Evaluation

## 2016-12-17 NOTE — Op Note (Signed)
[facility] Patient Name: Caleb Barton Procedure Date: 12/17/2016 MRN: [id number] Attending MD: Nancy Fetter Dr., MD Date of Birth: [birth year] CSN: [id number] Age: 76 Admit Type: Outpatient Procedure:                Colonoscopy Indications:              High risk colon cancer surveillance: Personal                            history of colonic polyps, Last colonoscopy: May                            2015 Providers:                Joyice Faster. Ovila Lepage Dr., MD, Laverta Baltimore RN, RN,                            Alan Mulder, Technician, Cherylynn Ridges, Technician Referring MD:              Medicines:                Propofol per Anesthesia Complications:            No immediate complications. Estimated Blood Loss:     Estimated blood loss: none. Procedure:                Pre-Anesthesia Assessment:                           - Prior to the procedure, a History and Physical                            was performed, and patient medications and                            allergies were reviewed. The patient's tolerance of                            previous anesthesia was also reviewed. The risks                            and benefits of the procedure and the sedation                            options and risks were discussed with the patient.                            All questions were answered, and informed consent                            was obtained. Prior Anticoagulants: The patient has                            taken Xarelto (rivaroxaban), last dose was 2 days  prior to procedure. ASA Grade Assessment: II - A                            patient with mild systemic disease. After reviewing                            the risks and benefits, the patient was deemed in                            satisfactory condition to undergo the procedure.                           After obtaining informed consent, the colonoscope                             was passed under direct vision. Throughout the                            procedure, the patient's blood pressure, pulse, and                            oxygen saturations were monitored continuously. The                            [id number]-1497W ([id number]) scope was introduced through                            the anus and advanced to the the terminal ileum,                            with identification of the appendiceal orifice and                            IC valve. The patient had had previous colonoscopy                            and that colonoscopy was done with adult                            colonoscope and exceedingly difficult to reach the                            cecum. For that reason the small bowel Interscope                            was used. The colonoscopy was performed without                            difficulty. The patient tolerated the procedure                            well. The quality of the bowel preparation was  good. The terminal ileum, ileocecal valve,                            appendiceal orifice, and rectum were photographed. Scope In: 12:10:03 PM Scope Out: 12:45:25 PM Scope Withdrawal Time: 0 hours 24 minutes 14 seconds  Total Procedure Duration: 0 hours 35 minutes 22 seconds  Findings:      The perianal and digital rectal examinations were normal.      A 7 mm polyp was found in the cecum. The polyp was sessile. The polyp       was removed with a hot snare. Resection and retrieval were complete.      Non-bleeding internal hemorrhoids were found during retroflexion. The       hemorrhoids were small and Grade I (internal hemorrhoids that do not       prolapse). Impression:               - One 7 mm polyp in the cecum, removed with a hot                            snare. Resected and retrieved.                           - Non-bleeding internal hemorrhoids.                           - Personal history of colonic  polyps. Moderate Sedation:      MAC by anesthesia Recommendation:           - Patient has a contact number available for                            emergencies. The signs and symptoms of potential                            delayed complications were discussed with the                            patient. Return to normal activities tomorrow.                            Written discharge instructions were provided to the                            patient.                           - Resume previous diet.                           - Continue present medications.                           - Resume Xarelto (rivaroxaban) at prior dose in 5                            days.                           -  No aspirin, ibuprofen, naproxen, or other                            non-steroidal anti-inflammatory drugs for 5 days                            after polyp removal.                           - Repeat colonoscopy for surveillance based on                            pathology results. Procedure Code(s):        --- Professional ---                           [phone], Colonoscopy, flexible; with removal of                            tumor(s), polyp(s), or other lesion(s) by snare                            technique Diagnosis Code(s):        --- Professional ---                           D12.0, Benign neoplasm of cecum                           Z86.010, Personal history of colonic polyps                           K64.0, First degree hemorrhoids CPT copyright 2016 American Medical Association. All rights reserved. The codes documented in this report are preliminary and upon coder review may  be revised to meet current compliance requirements. Nancy Fetter Dr., MD 12/17/2016 1:04:36 PM This report has been signed electronically. Number of Addenda: 0

## 2016-12-17 NOTE — H&P (Signed)
Subjective:   Patient is a 76 y.o. male presents with Need for colonoscopy. He has had previous: polyps. Last colonoscopy 5/15 surveillance colonoscopy needed. He had a very difficult procedure treatise protuberant abdomen and we had difficulty advancing the scope. He had a SQL polyp removed in 2013 with high-grade dysplasia. He came back to have this repeated and was new onset of atrial fibrillation and has been treated by the cardiologist. Colonoscopies rescheduled and performed the hospital with Propulsid [place] using the small bowel enteroscope is very long and tortuous:. Procedure including risks and benefits discussed in office.  Patient Active Problem List   Diagnosis Date Noted  . Hypertension   . Dysrhythmia   . Stroke ([place])   . Sleep apnea   . Right knee DJD    Past Medical History:  Diagnosis Date  . 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 Dysrhythmia    RBBB  . Hypercholesteremia    takes Lipitor daily and niacin daily  . Hypertension    takes HCTZ daily and losartan daily  . Right knee DJD   . Sleep apnea    cpap;sleep study done at home;request report from dr.gates  . Stroke [facility])    TIA 15 yrs ago    Past Surgical History:  Procedure Laterality Date  . CARDIOVERSION N/A 10/14/2016   Procedure: CARDIOVERSION;  Surgeon: Larey Dresser, MD;  Location: [facility] ENDOSCOPY;  Service: Cardiovascular;  Laterality: N/A;  . cataracts both eyes     ioc lens  . COLONOSCOPY     with polyps  . COLONOSCOPY  08/20/2011   Procedure: COLONOSCOPY;  Surgeon: Winfield Cunas., MD;  Location: [facility];  Service: Endoscopy;  Laterality: N/A;  . EYE SURGERY     laser bilateral eyes  . KNEE ARTHROSCOPY  x 2 right knee  . LUMBAR LAMINECTOMY/DECOMPRESSION MICRODISCECTOMY Right 09/09/2012   Procedure: LUMBAR LAMINECTOMY/DECOMPRESSION MICRODISCECTOMY 1 LEVEL;  Surgeon: Eustace Moore, MD;  Location: [place] NEURO ORS;  Service: Neurosurgery;  Laterality: Right;  LUMBAR  LAMINECTOMY/DECOMPRESSION MICRODISCECTOMY 1 LEVEL  . TONSILLECTOMY  yrs ago  . TOTAL KNEE ARTHROPLASTY  02/09/2012   Procedure: TOTAL KNEE ARTHROPLASTY;  Surgeon: Lorn Junes, MD;  Location: [place];  Service: Orthopedics;  Laterality: Right;    Prescriptions Prior to Admission  Medication Sig Dispense Refill Last Dose  . amoxicillin (AMOXIL) 250 MG capsule Take 1,000 mg by mouth. 1 hour prior to dental appointments   Past Month at Unknown time  . atorvastatin (LIPITOR) 40 MG tablet Take 40 mg by mouth daily.   12/17/2016 at Unknown time  . chlorhexidine (PERIDEX) 0.12 % solution Use as directed 30 mLs in the mouth or throat 2 (two) times daily.    12/16/2016 at Unknown time  . hydrochlorothiazide (HYDRODIURIL) 25 MG tablet Take 25 mg by mouth daily.   12/17/2016 at Unknown time  . ibuprofen (ADVIL,MOTRIN) 200 MG tablet Take 800 mg by mouth 2 (two) times daily as needed for moderate pain.   Past Month at Unknown time  . latanoprost (XALATAN) 0.005 % ophthalmic solution Place 1 drop into both eyes 2 (two) times daily.    12/16/2016 at Unknown time  . losartan (COZAAR) 50 MG tablet Take 50 mg by mouth daily.   12/16/2016 at Unknown time  . Multiple Vitamin (MULTIVITAMIN WITH MINERALS) TABS tablet Take 1 tablet by mouth daily.   12/16/2016 at Unknown time  . niacin 500 MG tablet Take 500 mg  by mouth daily.    12/16/2016 at Unknown time  . Potassium 99 MG TABS Take 99 mg by mouth daily.   12/16/2016 at Unknown time  . timolol (TIMOPTIC) 0.25 % ophthalmic solution Place 1 drop into both eyes 2 (two) times daily.    12/16/2016 at Unknown time  . XARELTO 20 MG TABS tablet TAKE ONE TABLET BY MOUTH DAILY WITH SUPPER (Patient taking differently: TAKE ONE TABLET BY MOUTH DAILY WITH BREAKFAST) 30 tablet 6 Past Week at Unknown time  . acetaminophen (TYLENOL) 500 MG tablet Take 1,500 mg by mouth daily as needed for headache.    More than a month at Unknown time  . docusate sodium (COLACE) 100 MG capsule Take 100 mg  by mouth daily as needed for moderate constipation.    More than a month at Unknown time   Allergies  Allergen Reactions  . Lisinopril     COUGH   . Sulfa Antibiotics Rash    Social History  Substance Use Topics  . Smoking status: Former Research scientist (life sciences)  . Smokeless tobacco: Never Used     Comment: quit 35 yrs ago  . Alcohol use Yes     Comment: social    Family History  Problem Relation Age of Onset  . Stroke Mother      Objective:   Patient Vitals for the past 8 hrs:  Temp Temp src Pulse Resp SpO2  12/17/16 1045 97.6 F (36.4 C) Oral 60 16 99 %   No intake/output data recorded. No intake/output data recorded.   See MD Preop evaluation      Assessment:   1. History of cecal colon polyp with high-grade dysplasia. 2. Atrial fibrillation. His head cardioversion but was unsuccessful. He is on Xarelto. This is been on hold. He's been okay to hold this for 2 days prior to the procedure and is following up with cardiology. He has no symptoms with afib and has a very good ejection fraction normal left ventricular function.  Plan:   Will proceed with colonoscopy today with removal polyp. We will go ahead and use the small bowel enteroscope due to his quite tortuous colon.

## 2016-12-17 NOTE — Anesthesia Postprocedure Evaluation (Signed)
Anesthesia Post Note  Patient: Caleb Barton  Procedure(s) Performed: Procedure(s) (LRB): COLONOSCOPY WITH PROPOFOL (N/A)     Patient location during evaluation: PACU Anesthesia Type: MAC Level of consciousness: awake and alert Pain management: pain level controlled Vital Signs Assessment: post-procedure vital signs reviewed and stable Respiratory status: spontaneous breathing, nonlabored ventilation, respiratory function stable and patient connected to nasal cannula oxygen Cardiovascular status: stable and blood pressure returned to baseline Anesthetic complications: no    Last Vitals:  Vitals:   12/17/16 1300 12/17/16 1310  BP: 117/65 (!) 127/57  Pulse: 60 63  Resp: (!) 21 13  Temp:    SpO2: 98% 97%    Last Pain:  Vitals:   12/17/16 1045  TempSrc: Oral                 Ryan P Ellender

## 2016-12-17 NOTE — Transfer of Care (Signed)
Immediate Anesthesia Transfer of Care Note  Patient: Caleb Barton  Procedure(s) Performed: Procedure(s): COLONOSCOPY WITH PROPOFOL (N/A)  Patient Location: PACU  Anesthesia Type:MAC  Level of Consciousness:  sedated, patient cooperative and responds to stimulation  Airway & Oxygen Therapy:Patient Spontanous Breathing and Patient connected to face mask oxgen  Post-op Assessment:  Report given to PACU RN and Post -op Vital signs reviewed and stable  Post vital signs:  Reviewed and stable  Last Vitals:  Vitals:   12/17/16 1047 12/17/16 1253  BP: 121/69 111/81  Pulse:  89  Resp:  16  Temp:    SpO2:  57%    Complications: No apparent anesthesia complications

## 2016-12-19 ENCOUNTER — Encounter (HOSPITAL_COMMUNITY): Payer: Self-pay | Admitting: Gastroenterology

## 2016-12-25 DIAGNOSIS — M84374D Stress fracture, right foot, subsequent encounter for fracture with routine healing: Secondary | ICD-10-CM | POA: Diagnosis not present

## 2016-12-26 DIAGNOSIS — G4733 Obstructive sleep apnea (adult) (pediatric): Secondary | ICD-10-CM | POA: Diagnosis not present

## 2017-01-01 ENCOUNTER — Encounter: Payer: Medicare HMO | Admitting: Sports Medicine

## 2017-01-15 DIAGNOSIS — M84374D Stress fracture, right foot, subsequent encounter for fracture with routine healing: Secondary | ICD-10-CM | POA: Diagnosis not present

## 2017-01-21 ENCOUNTER — Ambulatory Visit: Payer: Medicare HMO | Admitting: Cardiology

## 2017-01-27 DIAGNOSIS — M79671 Pain in right foot: Secondary | ICD-10-CM | POA: Diagnosis not present

## 2017-02-02 DIAGNOSIS — Z23 Encounter for immunization: Secondary | ICD-10-CM | POA: Diagnosis not present

## 2017-02-04 NOTE — Progress Notes (Signed)
Cardiology Office Note   Date:  02/06/2017   ID:  SHEMUEL Barton, DOB 04-13-1941, MRN [id number]  PCP:  [name], MD  Cardiologist:   Zaire Levesque [place], MD   Chief Complaint  Patient presents with  . New Patient (Initial Visit)    afib      History of Present Illness: Caleb Barton is a 76 y.o. male who presents to establish cardiac care. He has  Afib. He presented to the Afib clinic in May with new onset AFib. This was found when he was scheduled for colonoscopy. He was asymptomatic. Rate controlled on no therapy- pulse rate 60. He was anticoagulated and underwent DCCV on 10/14/16. He states he remained in NSR for only one day. Echo was unremarkable. Decision was made to continue anticoagulation and leave him in AFib since he was asymptomatic.   Review of his old records shows he had a  cardiac cath in 2007 by Dr. Tamala Julian. This was done for an abnormal Myoview study. Cardiac cath was normal. Interestingly the patient has no recollection of having this done. He has a history of LAFB/RBBB, HTN, OSA on CPAP, and remote thalamic CVA in 2006. Also history of HLD. Colonoscopy was done on 12/17/16 with findings of one polyp.   On follow up today he continues to feel well. He denies any chest pain, SOB, palpitations, dizziness, syncope, orthopnea, PND. Feels energy level is good. He is tolerating Xarelto well. Has been on Mobic for the past month for a stress fracture in his right foot but he denies any pain.    Past Medical History:  Diagnosis Date  . Cancer ([place])    precancerous skin cells reomved from face  . Dysrhythmia    RBBB  . Hypercholesteremia    takes Lipitor daily and niacin daily  . Hypertension    takes HCTZ daily and losartan daily  . Right knee DJD   . Sleep apnea    cpap;sleep study done at home;request report from dr.gates  . Stroke [facility])    TIA 15 yrs ago    Past Surgical History:  Procedure Laterality Date  . CARDIOVERSION N/A 10/14/2016   Procedure:  CARDIOVERSION;  Surgeon: Larey Dresser, MD;  Location: [facility] ENDOSCOPY;  Service: Cardiovascular;  Laterality: N/A;  . cataracts both eyes     ioc lens  . COLONOSCOPY     with polyps  . COLONOSCOPY  08/20/2011   Procedure: COLONOSCOPY;  Surgeon: Winfield Cunas., MD;  Location: [facility] ENDOSCOPY;  Service: Endoscopy;  Laterality: N/A;  . COLONOSCOPY WITH PROPOFOL N/A 12/17/2016   Procedure: COLONOSCOPY WITH PROPOFOL;  Surgeon: Laurence Spates, MD;  Location: [facility] ENDOSCOPY;  Service: Endoscopy;  Laterality: N/A;  . EYE SURGERY     laser bilateral eyes  . KNEE ARTHROSCOPY  x 2 right knee  . LUMBAR LAMINECTOMY/DECOMPRESSION MICRODISCECTOMY Right 09/09/2012   Procedure: LUMBAR LAMINECTOMY/DECOMPRESSION MICRODISCECTOMY 1 LEVEL;  Surgeon: Eustace Moore, MD;  Location: [place] NEURO ORS;  Service: Neurosurgery;  Laterality: Right;  LUMBAR LAMINECTOMY/DECOMPRESSION MICRODISCECTOMY 1 LEVEL  . TONSILLECTOMY  yrs ago  . TOTAL KNEE ARTHROPLASTY  02/09/2012   Procedure: TOTAL KNEE ARTHROPLASTY;  Surgeon: Lorn Junes, MD;  Location: [place];  Service: Orthopedics;  Laterality: Right;     Current Outpatient Prescriptions  Medication Sig Dispense Refill  . acetaminophen (TYLENOL) 500 MG tablet Take 1,500 mg by mouth daily as needed for headache.     [name] amoxicillin (AMOXIL) 250 MG capsule Take 1,000  mg by mouth. 1 hour prior to dental appointments    . atorvastatin (LIPITOR) 40 MG tablet Take 40 mg by mouth daily.    . chlorhexidine (PERIDEX) 0.12 % solution Use as directed 30 mLs in the mouth or throat 2 (two) times daily.     [name] docusate sodium (COLACE) 100 MG capsule Take 100 mg by mouth daily as needed for moderate constipation.     . hydrochlorothiazide (HYDRODIURIL) 25 MG tablet Take 25 mg by mouth daily.    [name] ibuprofen (ADVIL,MOTRIN) 200 MG tablet Take 800 mg by mouth 2 (two) times daily as needed for moderate pain.    [name] latanoprost (XALATAN) 0.005 % ophthalmic solution Place 1 drop into both eyes 2 (two)  times daily.     [name] losartan (COZAAR) 50 MG tablet Take 50 mg by mouth daily.    . Multiple Vitamin (MULTIVITAMIN WITH MINERALS) TABS tablet Take 1 tablet by mouth daily.    . niacin 500 MG tablet Take 500 mg by mouth daily.     . Potassium 99 MG TABS Take 99 mg by mouth daily.    . timolol (TIMOPTIC) 0.25 % ophthalmic solution Place 1 drop into both eyes 2 (two) times daily.     Alveda Reasons 20 MG TABS tablet TAKE ONE TABLET BY MOUTH DAILY WITH SUPPER (Patient taking differently: TAKE ONE TABLET BY MOUTH DAILY WITH BREAKFAST) 30 tablet 6   No current facility-administered medications for this visit.     Allergies:   Lisinopril and Sulfa antibiotics    Social History:  The patient  reports that he has quit smoking. He has never used smokeless tobacco. He reports that he drinks alcohol. He reports that he does not use drugs.   Family History:  The patient's family history includes Stroke in his mother.    ROS:  Please see the history of present illness.   Otherwise, review of systems are positive for none.   All other systems are reviewed and negative.    PHYSICAL EXAM: VS:  BP 140/86   Pulse (!) 45   Ht 5\' 9"  (1.753 m)   Wt 233 lb 6.4 oz (105.9 kg)   BMI 34.47 kg/m  , BMI Body mass index is 34.47 kg/m. GENERAL:  Well appearing, overweight WM in NAD HEENT:  PERRL, EOMI, sclera are clear. Oropharynx is clear. NECK:  No jugular venous distention, carotid upstroke brisk and symmetric, no bruits, no thyromegaly or adenopathy LUNGS:  Clear to auscultation bilaterally CHEST:  Unremarkable HEART:  IRRR- slow,  PMI not displaced or sustained,S1 and S2 within normal limits, no S3, no S4: no clicks, no rubs, no murmurs ABD:  Soft, nontender. BS +, no masses or bruits. No hepatomegaly, no splenomegaly EXT:  2 + pulses throughout, no edema, no cyanosis no clubbing SKIN:  Warm and dry.  No rashes NEURO:  Alert and oriented x 3. Cranial nerves II through XII intact. PSYCH:  Cognitively  intact     EKG:  EKG is ordered today. The ekg ordered today demonstrates Afib with slow ventricular response with rate 45 bpm. RBBB, LAFB. I have personally reviewed and interpreted this study.    Recent Labs: 09/08/2016: TSH 1.491 10/14/2016: BUN 12; Creatinine, Ser 0.84; Hemoglobin 14.5; Platelets 179; Potassium 3.7; Sodium 141    Lipid Panel No results found for: CHOL, TRIG, HDL, CHOLHDL, VLDL, LDLCALC, LDLDIRECT    Wt Readings from Last 3 Encounters:  02/06/17 233 lb 6.4 oz (105.9 kg)  12/17/16 221  lb (100.2 kg)  10/21/16 221 lb (100.2 kg)    Dated 05/09/16: cholesterol 148, triglycerides 108, HDL 44, LDL 82.   Other studies Reviewed: Echo: 09/23/16: Study Conclusions  - Left ventricle: The cavity size was normal. Systolic function was   normal. The estimated ejection fraction was in the range of 55%   to 60%. Wall motion was normal; there were no regional wall   motion abnormalities. - Mitral valve: Calcified annulus. - Left atrium: The atrium was mildly dilated. - Atrial septum: No defect or patent foramen ovale was identified   ASSESSMENT AND PLAN:  1.  Atrial fibrillation persistent. This was diagnosed in May. S/p DCCV but did not hold. Asymptomatic so rhythm strategy not pursued. I am very concerned, however, with his marked bradycardia. HR 45 in Afib. 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 I am concerned he may need a pacemaker. Will have him wear a 24 hour Holter monitor. If brady persists or if he has pauses will refer to EP for pacer.  2. OSA on CPAP. 3. Stress fracture in right foot. Recommend avoiding NSAIDS like Mobic since on Xarelto.  4. HTN controlled. 5. Remote CVA.    Current medicines are reviewed at length with the patient today.  The patient does not have concerns regarding medicines.  The following changes have been made:  no change  Labs/ tests ordered today include:   Orders Placed This Encounter  Procedures  . Holter  monitor - 24 hour  . EKG 12-Lead     Disposition:   FU with me in 3 months  Signed, Kitt Ledet Martin[place], MD  02/06/2017 11:06 AM    [facility] [address], [place], [place], [address] Phone [phone], Fax [phone]

## 2017-02-05 DIAGNOSIS — D2371 Other benign neoplasm of skin of right lower limb, including hip: Secondary | ICD-10-CM | POA: Diagnosis not present

## 2017-02-05 DIAGNOSIS — C44619 Basal cell carcinoma of skin of left upper limb, including shoulder: Secondary | ICD-10-CM | POA: Diagnosis not present

## 2017-02-05 DIAGNOSIS — D1801 Hemangioma of skin and subcutaneous tissue: Secondary | ICD-10-CM | POA: Diagnosis not present

## 2017-02-05 DIAGNOSIS — L812 Freckles: Secondary | ICD-10-CM | POA: Diagnosis not present

## 2017-02-05 DIAGNOSIS — Z85828 Personal history of other malignant neoplasm of skin: Secondary | ICD-10-CM | POA: Diagnosis not present

## 2017-02-05 DIAGNOSIS — D485 Neoplasm of uncertain behavior of skin: Secondary | ICD-10-CM | POA: Diagnosis not present

## 2017-02-05 DIAGNOSIS — L821 Other seborrheic keratosis: Secondary | ICD-10-CM | POA: Diagnosis not present

## 2017-02-06 ENCOUNTER — Encounter: Payer: Self-pay | Admitting: Cardiology

## 2017-02-06 ENCOUNTER — Telehealth: Payer: Self-pay | Admitting: Cardiology

## 2017-02-06 ENCOUNTER — Ambulatory Visit (INDEPENDENT_AMBULATORY_CARE_PROVIDER_SITE_OTHER): Payer: Medicare HMO | Admitting: Cardiology

## 2017-02-06 VITALS — BP 140/86 | HR 45 | Ht 69.0 in | Wt 233.4 lb

## 2017-02-06 DIAGNOSIS — I481 Persistent atrial fibrillation: Secondary | ICD-10-CM | POA: Diagnosis not present

## 2017-02-06 DIAGNOSIS — R001 Bradycardia, unspecified: Secondary | ICD-10-CM

## 2017-02-06 DIAGNOSIS — I452 Bifascicular block: Secondary | ICD-10-CM | POA: Diagnosis not present

## 2017-02-06 DIAGNOSIS — I4819 Other persistent atrial fibrillation: Secondary | ICD-10-CM

## 2017-02-06 NOTE — Telephone Encounter (Signed)
New message   Patient calling to confirm  He did have a heart cath  in  2007.

## 2017-02-06 NOTE — Patient Instructions (Signed)
Stop taking Meloxicam and other nonsteroidal anti-inflammatory drugs like Aleve, ibuprofen, etc.  Take Tylenol prn instead  We will have you wear a monitor for 24 hours.  I will see you in 3 months.

## 2017-02-06 NOTE — Telephone Encounter (Signed)
I think this is FYI only - pt called to confirm he was aware of having had this procedure.

## 2017-02-12 ENCOUNTER — Ambulatory Visit (INDEPENDENT_AMBULATORY_CARE_PROVIDER_SITE_OTHER): Payer: Medicare HMO

## 2017-02-12 DIAGNOSIS — R001 Bradycardia, unspecified: Secondary | ICD-10-CM

## 2017-02-12 DIAGNOSIS — I4819 Other persistent atrial fibrillation: Secondary | ICD-10-CM

## 2017-02-12 DIAGNOSIS — I452 Bifascicular block: Secondary | ICD-10-CM

## 2017-02-12 DIAGNOSIS — I481 Persistent atrial fibrillation: Secondary | ICD-10-CM

## 2017-04-15 DIAGNOSIS — C801 Malignant (primary) neoplasm, unspecified: Secondary | ICD-10-CM | POA: Insufficient documentation

## 2017-04-15 DIAGNOSIS — E7800 Pure hypercholesterolemia, unspecified: Secondary | ICD-10-CM | POA: Insufficient documentation

## 2017-04-15 DIAGNOSIS — I10 Essential (primary) hypertension: Secondary | ICD-10-CM | POA: Insufficient documentation

## 2017-04-25 NOTE — Progress Notes (Signed)
Cardiology Office Note   Date:  04/30/2017   ID:  Caleb Barton, DOB October 11, 1940, MRN [id number]  PCP:  [name], MD  Cardiologist:   Jaquail Mclees Martinique, MD   Chief Complaint  Patient presents with  . Atrial Fibrillation      History of Present Illness: Caleb Barton is a 77 y.o. male who is seen for follow up Afib.  He presented to the Afib clinic in May with new onset AFib. This was found when he was scheduled for colonoscopy. He was asymptomatic. Rate controlled on no therapy- pulse rate 60. He was anticoagulated and underwent DCCV on 10/14/16. He states he remained in NSR for only one day. Echo was unremarkable. Decision was made to continue anticoagulation and leave him in AFib since he was asymptomatic. Holter monitor was done in October to evaluate bradycardia. It showed no significant symptomatic bradycardia or pauses.   Review of his old records shows he had a  cardiac cath in 2007 by Dr. Tamala Julian. This was done for an abnormal Myoview study. Cardiac cath was normal. Interestingly the patient has no recollection of having this done. He has a history of LAFB/RBBB, HTN, OSA on CPAP, and remote thalamic CVA in 2006. Also history of HLD. Colonoscopy was done on 12/17/16 with findings of one polyp.   On follow up today he continues to feel well. He did develop a recent head cold/bronchitis. He is on antibiotics and Ventolin. States the Ventolin makes him feel funny. He denies any chest pain, SOB, palpitations, dizziness, syncope, orthopnea, PND. Feels energy level is good. He is tolerating Xarelto well without bleeding.   Past Medical History:  Diagnosis Date  . 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 Dysrhythmia    RBBB  . Hypercholesteremia    takes Lipitor daily and niacin daily  . Hypertension    takes HCTZ daily and losartan daily  . Right knee DJD   . Sleep apnea    cpap;sleep study done at home;request report from dr.gates  . Stroke [facility])    TIA 15 yrs  ago    Past Surgical History:  Procedure Laterality Date  . CARDIOVERSION N/A 10/14/2016   Procedure: CARDIOVERSION;  Surgeon: Larey Dresser, MD;  Location: [facility] ENDOSCOPY;  Service: Cardiovascular;  Laterality: N/A;  . cataracts both eyes     ioc lens  . COLONOSCOPY     with polyps  . COLONOSCOPY  08/20/2011   Procedure: COLONOSCOPY;  Surgeon: Winfield Cunas., MD;  Location: [facility] ENDOSCOPY;  Service: Endoscopy;  Laterality: N/A;  . COLONOSCOPY WITH PROPOFOL N/A 12/17/2016   Procedure: COLONOSCOPY WITH PROPOFOL;  Surgeon: Laurence Spates, MD;  Location: [facility] ENDOSCOPY;  Service: Endoscopy;  Laterality: N/A;  . EYE SURGERY     laser bilateral eyes  . KNEE ARTHROSCOPY  x 2 right knee  . LUMBAR LAMINECTOMY/DECOMPRESSION MICRODISCECTOMY Right 09/09/2012   Procedure: LUMBAR LAMINECTOMY/DECOMPRESSION MICRODISCECTOMY 1 LEVEL;  Surgeon: Eustace Moore, MD;  Location: [place] NEURO ORS;  Service: Neurosurgery;  Laterality: Right;  LUMBAR LAMINECTOMY/DECOMPRESSION MICRODISCECTOMY 1 LEVEL  . TONSILLECTOMY  yrs ago  . TOTAL KNEE ARTHROPLASTY  02/09/2012   Procedure: TOTAL KNEE ARTHROPLASTY;  Surgeon: Lorn Junes, MD;  Location: [place];  Service: Orthopedics;  Laterality: Right;     Current Outpatient Medications  Medication Sig Dispense Refill  . acetaminophen (TYLENOL) 500 MG tablet Take 1,500 mg by mouth daily as needed for headache.     [name]  amoxicillin (AMOXIL) 250 MG capsule Take 1,000 mg by mouth. 1 hour prior to dental appointments    . amoxicillin-clavulanate (AUGMENTIN) 875-125 MG tablet Take 1 tablet by mouth every 12 (twelve) hours. For 10 days.    [name] atorvastatin (LIPITOR) 40 MG tablet Take 40 mg by mouth daily.    . chlorhexidine (PERIDEX) 0.12 % solution Use as directed 30 mLs in the mouth or throat 2 (two) times daily.     [name] docusate sodium (COLACE) 100 MG capsule Take 100 mg by mouth daily as needed for moderate constipation.     . hydrochlorothiazide (HYDRODIURIL) 25 MG tablet Take 25 mg  by mouth daily.    [name] ibuprofen (ADVIL,MOTRIN) 200 MG tablet Take 800 mg by mouth 2 (two) times daily as needed for moderate pain.    [name] latanoprost (XALATAN) 0.005 % ophthalmic solution Place 1 drop into both eyes 2 (two) times daily.     [name] losartan (COZAAR) 50 MG tablet Take 50 mg by mouth daily.    . Multiple Vitamin (MULTIVITAMIN WITH MINERALS) TABS tablet Take 1 tablet by mouth daily.    . niacin 500 MG tablet Take 500 mg by mouth daily.     . Potassium 99 MG TABS Take 99 mg by mouth daily.    . timolol (TIMOPTIC) 0.25 % ophthalmic solution Place 1 drop into both eyes 2 (two) times daily.     Alveda Reasons 20 MG TABS tablet TAKE ONE TABLET BY MOUTH DAILY WITH SUPPER (Patient taking differently: TAKE ONE TABLET BY MOUTH DAILY WITH BREAKFAST) 30 tablet 6   No current facility-administered medications for this visit.     Allergies:   Lisinopril and Sulfa antibiotics    Social History:  The patient  reports that he has quit smoking. he has never used smokeless tobacco. He reports that he drinks alcohol. He reports that he does not use drugs.   Family History:  The patient's family history includes Stroke in his mother.    ROS:  Please see the history of present illness.   Otherwise, review of systems are positive for none.   All other systems are reviewed and negative.    PHYSICAL EXAM: VS:  BP 134/84 (BP Location: Left Arm, Patient Position: Sitting, Cuff Size: Large)   Pulse 66   Ht 5\' 9"  (1.753 m)   Wt 231 lb (104.8 kg)   BMI 34.11 kg/m  , BMI Body mass index is 34.11 kg/m. GENERAL:  Well appearing overweight WM HEENT:  PERRL, EOMI, sclera are clear. Oropharynx is clear. NECK:  No jugular venous distention, carotid upstroke brisk and symmetric, no bruits, no thyromegaly or adenopathy LUNGS:  Clear to auscultation bilaterally CHEST:  Unremarkable HEART:  RRR,  PMI not displaced or sustained,S1 and S2 within normal limits, no S3, no S4: no clicks, no rubs, no murmurs ABD:  Soft,  nontender. BS +, no masses or bruits. No hepatomegaly, no splenomegaly EXT:  2 + pulses throughout, no edema, no cyanosis no clubbing SKIN:  Warm and dry.  No rashes NEURO:  Alert and oriented x 3. Cranial nerves II through XII intact. PSYCH:  Cognitively intact    Recent Labs: 09/08/2016: TSH 1.491 10/14/2016: BUN 12; Creatinine, Ser 0.84; Hemoglobin 14.5; Platelets 179; Potassium 3.7; Sodium 141    Lipid Panel No results found for: CHOL, TRIG, HDL, CHOLHDL, VLDL, LDLCALC, LDLDIRECT    Wt Readings from Last 3 Encounters:  04/30/17 231 lb (104.8 kg)  02/06/17 233 lb 6.4 oz (105.9  kg)  12/17/16 221 lb (100.2 kg)    Dated 05/09/16: cholesterol 148, triglycerides 108, HDL 44, LDL 82.   Other studies Reviewed: Echo: 09/23/16: Study Conclusions  - Left ventricle: The cavity size was normal. Systolic function was   normal. The estimated ejection fraction was in the range of 55%   to 60%. Wall motion was normal; there were no regional wall   motion abnormalities. - Mitral valve: Calcified annulus. - Left atrium: The atrium was mildly dilated. - Atrial septum: No defect or patent foramen ovale was identified  Holter 02/12/17: Study Highlights    Atrial fibrillation/flutter with controlled ventricular rate. Mean HR 81 bpm  Slowest HR of 35 during sleeping hours.  Occasional PVCs and rare PVC couplets.      ASSESSMENT AND PLAN:  1.  Atrial fibrillation persistent.  S/p DCCV but did not hold. Asymptomatic so rhythm strategy not pursued. Holter showed good rate control without significant pauses. Will continue with rate control strategy and anticoagulation 2. OSA on CPAP. 3. HTN controlled. 4. Remote CVA.    Current medicines are reviewed at length with the patient today.  The patient does not have concerns regarding medicines.  The following changes have been made:  no change  Labs/ tests ordered today include:   No orders of the defined types were placed in this  encounter.    Disposition:   FU with me in 6 months  Signed, Hezakiah Champeau Martinique, MD  04/30/2017 8:41 AM    [facility] [address], [place], [place], [address] Phone [phone], Fax [phone]

## 2017-04-28 DIAGNOSIS — J209 Acute bronchitis, unspecified: Secondary | ICD-10-CM | POA: Diagnosis not present

## 2017-04-28 DIAGNOSIS — J329 Chronic sinusitis, unspecified: Secondary | ICD-10-CM | POA: Diagnosis not present

## 2017-04-30 ENCOUNTER — Ambulatory Visit: Payer: Medicare HMO | Admitting: Cardiology

## 2017-04-30 ENCOUNTER — Encounter: Payer: Self-pay | Admitting: Cardiology

## 2017-04-30 VITALS — BP 134/84 | HR 66 | Ht 69.0 in | Wt 231.0 lb

## 2017-04-30 DIAGNOSIS — I452 Bifascicular block: Secondary | ICD-10-CM | POA: Diagnosis not present

## 2017-04-30 DIAGNOSIS — I481 Persistent atrial fibrillation: Secondary | ICD-10-CM

## 2017-04-30 DIAGNOSIS — E7800 Pure hypercholesterolemia, unspecified: Secondary | ICD-10-CM | POA: Diagnosis not present

## 2017-04-30 DIAGNOSIS — G4733 Obstructive sleep apnea (adult) (pediatric): Secondary | ICD-10-CM | POA: Diagnosis not present

## 2017-04-30 DIAGNOSIS — I4819 Other persistent atrial fibrillation: Secondary | ICD-10-CM

## 2017-04-30 NOTE — Patient Instructions (Signed)
Continue your current therapy  I will see you in 6 months.   

## 2017-05-15 DIAGNOSIS — J209 Acute bronchitis, unspecified: Secondary | ICD-10-CM | POA: Diagnosis not present

## 2017-05-21 DIAGNOSIS — Z23 Encounter for immunization: Secondary | ICD-10-CM | POA: Diagnosis not present

## 2017-05-21 DIAGNOSIS — I10 Essential (primary) hypertension: Secondary | ICD-10-CM | POA: Diagnosis not present

## 2017-05-21 DIAGNOSIS — E669 Obesity, unspecified: Secondary | ICD-10-CM | POA: Diagnosis not present

## 2017-05-21 DIAGNOSIS — E785 Hyperlipidemia, unspecified: Secondary | ICD-10-CM | POA: Diagnosis not present

## 2017-05-21 DIAGNOSIS — I739 Peripheral vascular disease, unspecified: Secondary | ICD-10-CM | POA: Diagnosis not present

## 2017-05-21 DIAGNOSIS — I6930 Unspecified sequelae of cerebral infarction: Secondary | ICD-10-CM | POA: Diagnosis not present

## 2017-05-21 DIAGNOSIS — Z125 Encounter for screening for malignant neoplasm of prostate: Secondary | ICD-10-CM | POA: Diagnosis not present

## 2017-05-21 DIAGNOSIS — H9190 Unspecified hearing loss, unspecified ear: Secondary | ICD-10-CM | POA: Diagnosis not present

## 2017-05-21 DIAGNOSIS — I482 Chronic atrial fibrillation: Secondary | ICD-10-CM | POA: Diagnosis not present

## 2017-05-21 DIAGNOSIS — Z0000 Encounter for general adult medical examination without abnormal findings: Secondary | ICD-10-CM | POA: Diagnosis not present

## 2017-05-25 DIAGNOSIS — R69 Illness, unspecified: Secondary | ICD-10-CM | POA: Diagnosis not present

## 2017-06-02 DIAGNOSIS — H9193 Unspecified hearing loss, bilateral: Secondary | ICD-10-CM | POA: Diagnosis not present

## 2017-06-02 DIAGNOSIS — H903 Sensorineural hearing loss, bilateral: Secondary | ICD-10-CM | POA: Diagnosis not present

## 2017-06-22 ENCOUNTER — Other Ambulatory Visit (HOSPITAL_COMMUNITY): Payer: Self-pay | Admitting: Nurse Practitioner

## 2017-07-30 DIAGNOSIS — H409 Unspecified glaucoma: Secondary | ICD-10-CM | POA: Diagnosis not present

## 2017-07-30 DIAGNOSIS — N529 Male erectile dysfunction, unspecified: Secondary | ICD-10-CM | POA: Diagnosis not present

## 2017-07-30 DIAGNOSIS — E785 Hyperlipidemia, unspecified: Secondary | ICD-10-CM | POA: Diagnosis not present

## 2017-07-30 DIAGNOSIS — G8929 Other chronic pain: Secondary | ICD-10-CM | POA: Diagnosis not present

## 2017-07-30 DIAGNOSIS — M1990 Unspecified osteoarthritis, unspecified site: Secondary | ICD-10-CM | POA: Diagnosis not present

## 2017-07-30 DIAGNOSIS — E669 Obesity, unspecified: Secondary | ICD-10-CM | POA: Diagnosis not present

## 2017-07-30 DIAGNOSIS — Z6833 Body mass index (BMI) 33.0-33.9, adult: Secondary | ICD-10-CM | POA: Diagnosis not present

## 2017-07-30 DIAGNOSIS — G4730 Sleep apnea, unspecified: Secondary | ICD-10-CM | POA: Diagnosis not present

## 2017-07-30 DIAGNOSIS — I4891 Unspecified atrial fibrillation: Secondary | ICD-10-CM | POA: Diagnosis not present

## 2017-07-30 DIAGNOSIS — I10 Essential (primary) hypertension: Secondary | ICD-10-CM | POA: Diagnosis not present

## 2017-08-05 DIAGNOSIS — G4733 Obstructive sleep apnea (adult) (pediatric): Secondary | ICD-10-CM | POA: Diagnosis not present

## 2017-08-18 DIAGNOSIS — G4733 Obstructive sleep apnea (adult) (pediatric): Secondary | ICD-10-CM | POA: Diagnosis not present

## 2017-08-19 DIAGNOSIS — R7989 Other specified abnormal findings of blood chemistry: Secondary | ICD-10-CM | POA: Diagnosis not present

## 2017-09-10 DIAGNOSIS — R42 Dizziness and giddiness: Secondary | ICD-10-CM | POA: Diagnosis not present

## 2017-09-17 DIAGNOSIS — G4733 Obstructive sleep apnea (adult) (pediatric): Secondary | ICD-10-CM | POA: Diagnosis not present

## 2017-10-01 DIAGNOSIS — L570 Actinic keratosis: Secondary | ICD-10-CM | POA: Diagnosis not present

## 2017-10-01 DIAGNOSIS — J018 Other acute sinusitis: Secondary | ICD-10-CM | POA: Diagnosis not present

## 2017-10-01 DIAGNOSIS — J209 Acute bronchitis, unspecified: Secondary | ICD-10-CM | POA: Diagnosis not present

## 2017-10-01 DIAGNOSIS — D485 Neoplasm of uncertain behavior of skin: Secondary | ICD-10-CM | POA: Diagnosis not present

## 2017-10-01 DIAGNOSIS — Z85828 Personal history of other malignant neoplasm of skin: Secondary | ICD-10-CM | POA: Diagnosis not present

## 2017-10-01 DIAGNOSIS — C44622 Squamous cell carcinoma of skin of right upper limb, including shoulder: Secondary | ICD-10-CM | POA: Diagnosis not present

## 2017-10-18 DIAGNOSIS — G4733 Obstructive sleep apnea (adult) (pediatric): Secondary | ICD-10-CM | POA: Diagnosis not present

## 2017-10-19 ENCOUNTER — Encounter: Payer: Self-pay | Admitting: Cardiology

## 2017-10-19 DIAGNOSIS — G4733 Obstructive sleep apnea (adult) (pediatric): Secondary | ICD-10-CM | POA: Diagnosis not present

## 2017-10-27 NOTE — Progress Notes (Signed)
Cardiology Office Note   Date:  10/28/2017   ID:  Caleb Barton, DOB 1941/02/06, MRN [id number]  PCP:  Caleb Austin, MD  Cardiologist:   Caleb Martinique, MD   Chief Complaint  Patient presents with  . Atrial Fibrillation      History of Present Illness: Caleb Barton is a 77 y.o. male who is seen for follow up Afib.  He presented to the Afib clinic in May with new onset AFib. This was found when he was scheduled for colonoscopy. He was asymptomatic. Rate controlled on no therapy- pulse rate 60. He was anticoagulated and underwent DCCV on 10/14/16. He states he remained in NSR for only one day. Echo was unremarkable. Decision was made to continue anticoagulation and leave him in AFib since he was asymptomatic. Holter monitor was done in October to evaluate bradycardia. It showed no significant symptomatic bradycardia or pauses.   Review of his old records shows he had a  cardiac cath in 2007 by Dr. Tamala Barton. This was done for an abnormal Myoview study. Cardiac cath was normal. Interestingly the patient has no recollection of having this done. He has a history of LAFB/RBBB, HTN, OSA on CPAP, and remote thalamic CVA in 2006. Also history of HLD. Colonoscopy was done on 12/17/16 with findings of one polyp.   On follow up today he continues to feel well. He does a lot of work in his yard but no regular exercise. Denies any chest pain, palpitations, dizziness, SOB. States insurance nurse wanted to make sure circulation in legs was checked.   Past Medical History:  Diagnosis Date  . Cancer ([place])    precancerous skin cells reomved from face  . Dysrhythmia    RBBB  . Hypercholesteremia    takes Lipitor daily and niacin daily  . Hypertension    takes HCTZ daily and losartan daily  . Right knee DJD   . Sleep apnea    cpap;sleep study done at home;request report from dr.gates  . Stroke [facility])    TIA 15 yrs ago    Past Surgical History:  Procedure Laterality Date  . CARDIOVERSION N/A  10/14/2016   Procedure: CARDIOVERSION;  Surgeon: Caleb Dresser, MD;  Location: [facility] ENDOSCOPY;  Service: Cardiovascular;  Laterality: N/A;  . cataracts both eyes     ioc lens  . COLONOSCOPY     with polyps  . COLONOSCOPY  08/20/2011   Procedure: COLONOSCOPY;  Surgeon: Caleb Barton., MD;  Location: [facility] ENDOSCOPY;  Service: Endoscopy;  Laterality: N/A;  . COLONOSCOPY WITH PROPOFOL N/A 12/17/2016   Procedure: COLONOSCOPY WITH PROPOFOL;  Surgeon: Caleb Spates, MD;  Location: [facility] ENDOSCOPY;  Service: Endoscopy;  Laterality: N/A;  . EYE SURGERY     laser bilateral eyes  . KNEE ARTHROSCOPY  x 2 right knee  . LUMBAR LAMINECTOMY/DECOMPRESSION MICRODISCECTOMY Right 09/09/2012   Procedure: LUMBAR LAMINECTOMY/DECOMPRESSION MICRODISCECTOMY 1 LEVEL;  Surgeon: Caleb Moore, MD;  Location: [facility] NEURO ORS;  Service: Neurosurgery;  Laterality: Right;  LUMBAR LAMINECTOMY/DECOMPRESSION MICRODISCECTOMY 1 LEVEL  . TONSILLECTOMY  yrs ago  . TOTAL KNEE ARTHROPLASTY  02/09/2012   Procedure: TOTAL KNEE ARTHROPLASTY;  Surgeon: Caleb Junes, MD;  Location: [facility];  Service: Orthopedics;  Laterality: Right;     Current Outpatient Medications  Medication Sig Dispense Refill  . acetaminophen (TYLENOL) 500 MG tablet Take 1,500 mg by mouth daily as needed for headache.     [name] amoxicillin (AMOXIL) 250 MG capsule Take 1,000 mg by  mouth. 1 hour prior to dental appointments    . atorvastatin (LIPITOR) 40 MG tablet Take 40 mg by mouth daily.    . chlorhexidine (PERIDEX) 0.12 % solution Use as directed 30 mLs in the mouth or throat 2 (two) times daily.     . ciprofloxacin (CIPRO) 500 MG tablet     . docusate sodium (COLACE) 100 MG capsule Take 100 mg by mouth daily as needed for moderate constipation.     . hydrochlorothiazide (HYDRODIURIL) 25 MG tablet Take 25 mg by mouth daily.    [name] latanoprost (XALATAN) 0.005 % ophthalmic solution Place 1 drop into both eyes 2 (two) times daily.     [name] losartan (COZAAR) 50 MG tablet  Take 50 mg by mouth daily.    . Multiple Vitamin (MULTIVITAMIN WITH MINERALS) TABS tablet Take 1 tablet by mouth daily.    . niacin 500 MG tablet Take 500 mg by mouth daily.     . Potassium 99 MG TABS Take 99 mg by mouth daily.    . timolol (TIMOPTIC) 0.25 % ophthalmic solution Place 1 drop into both eyes 2 (two) times daily.     Alveda Reasons 20 MG TABS tablet TAKE ONE TABLET BY MOUTH DAILY WITH SUPPER 30 tablet 5   No current facility-administered medications for this visit.     Allergies:   Lisinopril; Other; and Sulfa antibiotics    Social History:  The patient  reports that he has quit smoking. He has never used smokeless tobacco. He reports that he drinks alcohol. He reports that he does not use drugs.   Family History:  The patient's family history includes Stroke in his mother.    ROS:  Please see the history of present illness.   Otherwise, review of systems are positive for none.   All other systems are reviewed and negative.    PHYSICAL EXAM: VS:  BP 115/68   Pulse 71   Ht 5\' 9"  (1.753 m)   Wt 230 lb (104.3 kg)   BMI 33.97 kg/m  , BMI Body mass index is 33.97 kg/m. GENERAL:  Well appearing overweight WM HEENT:  PERRL, EOMI, sclera are clear. Oropharynx is clear. NECK:  No jugular venous distention, carotid upstroke brisk and symmetric, no bruits, no thyromegaly or adenopathy LUNGS:  Clear to auscultation bilaterally CHEST:  Unremarkable HEART:  RRR,  PMI not displaced or sustained,S1 and S2 within normal limits, no S3, no S4: no clicks, no rubs, no murmurs ABD:  Soft, nontender. BS +, no masses or bruits. No hepatomegaly, no splenomegaly EXT:  2 + pulses throughout, no edema, no cyanosis no clubbing SKIN:  Warm and dry.  No rashes NEURO:  Alert and oriented x 3. Cranial nerves II through XII intact. PSYCH:  Cognitively intact    Recent Labs: No results found for requested labs within last 8760 hours.    Lipid Panel No results found for: CHOL, TRIG, HDL, CHOLHDL,  VLDL, LDLCALC, LDLDIRECT    Wt Readings from Last 3 Encounters:  10/28/17 230 lb (104.3 kg)  04/30/17 231 lb (104.8 kg)  02/06/17 233 lb 6.4 oz (105.9 kg)    Dated 05/09/16: cholesterol 148, triglycerides 108, HDL 44, LDL 82.  Dated 05/21/17: cholesterol 121, triglycerides 81, HDL 34, LDL 71. Chemistry panel normal.  Other studies Reviewed: Echo: 09/23/16: Study Conclusions  - Left ventricle: The cavity size was normal. Systolic function was   normal. The estimated ejection fraction was in the range of 55%   to  60%. Wall motion was normal; there were no regional wall   motion abnormalities. - Mitral valve: Calcified annulus. - Left atrium: The atrium was mildly dilated. - Atrial septum: No defect or patent foramen ovale was identified  Holter 02/12/17: Study Highlights    Atrial fibrillation/flutter with controlled ventricular rate. Mean HR 81 bpm  Slowest HR of 35 during sleeping hours.  Occasional PVCs and rare PVC couplets.      ASSESSMENT AND PLAN:  1.  Atrial fibrillation persistent.  S/p DCCV but did not hold. Asymptomatic so managed with rate control and anticoagulation.  Holter showed good rate control without significant pauses. Will continue with rate control strategy and anticoagulation- Xarelto 2. OSA on CPAP. 3. HTN controlled. 4. Remote CVA.  5. Normal pulses in feet.    Current medicines are reviewed at length with the patient today.  The patient does not have concerns regarding medicines.  The following changes have been made:  no change  Labs/ tests ordered today include:   No orders of the defined types were placed in this encounter.    Disposition:   FU with me in one year.   Signed, Caleb Martinique, MD  10/28/2017 3:25 PM    [facility] [address], [place], [place], [address] Phone [phone], Fax [phone]

## 2017-10-28 ENCOUNTER — Ambulatory Visit: Payer: Medicare HMO | Admitting: Cardiology

## 2017-10-28 ENCOUNTER — Encounter: Payer: Self-pay | Admitting: Cardiology

## 2017-10-28 VITALS — BP 115/68 | HR 71 | Ht 69.0 in | Wt 230.0 lb

## 2017-10-28 DIAGNOSIS — I481 Persistent atrial fibrillation: Secondary | ICD-10-CM

## 2017-10-28 DIAGNOSIS — E7800 Pure hypercholesterolemia, unspecified: Secondary | ICD-10-CM | POA: Diagnosis not present

## 2017-10-28 DIAGNOSIS — I4819 Other persistent atrial fibrillation: Secondary | ICD-10-CM

## 2017-10-28 DIAGNOSIS — I452 Bifascicular block: Secondary | ICD-10-CM | POA: Diagnosis not present

## 2017-10-28 NOTE — Patient Instructions (Signed)
Continue your current therapy  I will see you in one year   

## 2017-11-06 DIAGNOSIS — N50811 Right testicular pain: Secondary | ICD-10-CM | POA: Diagnosis not present

## 2017-11-17 DIAGNOSIS — G4733 Obstructive sleep apnea (adult) (pediatric): Secondary | ICD-10-CM | POA: Diagnosis not present

## 2017-11-19 DIAGNOSIS — E785 Hyperlipidemia, unspecified: Secondary | ICD-10-CM | POA: Diagnosis not present

## 2017-11-19 DIAGNOSIS — I482 Chronic atrial fibrillation: Secondary | ICD-10-CM | POA: Diagnosis not present

## 2017-11-19 DIAGNOSIS — I10 Essential (primary) hypertension: Secondary | ICD-10-CM | POA: Diagnosis not present

## 2017-11-19 DIAGNOSIS — G4733 Obstructive sleep apnea (adult) (pediatric): Secondary | ICD-10-CM | POA: Diagnosis not present

## 2017-11-30 DIAGNOSIS — R69 Illness, unspecified: Secondary | ICD-10-CM | POA: Diagnosis not present

## 2017-12-18 DIAGNOSIS — G4733 Obstructive sleep apnea (adult) (pediatric): Secondary | ICD-10-CM | POA: Diagnosis not present

## 2017-12-26 ENCOUNTER — Other Ambulatory Visit (HOSPITAL_COMMUNITY): Payer: Self-pay | Admitting: Cardiology

## 2018-01-06 DIAGNOSIS — N50811 Right testicular pain: Secondary | ICD-10-CM | POA: Diagnosis not present

## 2018-01-06 DIAGNOSIS — R102 Pelvic and perineal pain: Secondary | ICD-10-CM | POA: Diagnosis not present

## 2018-01-18 DIAGNOSIS — G4733 Obstructive sleep apnea (adult) (pediatric): Secondary | ICD-10-CM | POA: Diagnosis not present

## 2018-02-05 DIAGNOSIS — L570 Actinic keratosis: Secondary | ICD-10-CM | POA: Diagnosis not present

## 2018-02-05 DIAGNOSIS — L812 Freckles: Secondary | ICD-10-CM | POA: Diagnosis not present

## 2018-02-05 DIAGNOSIS — D1801 Hemangioma of skin and subcutaneous tissue: Secondary | ICD-10-CM | POA: Diagnosis not present

## 2018-02-05 DIAGNOSIS — L853 Xerosis cutis: Secondary | ICD-10-CM | POA: Diagnosis not present

## 2018-02-05 DIAGNOSIS — L821 Other seborrheic keratosis: Secondary | ICD-10-CM | POA: Diagnosis not present

## 2018-02-05 DIAGNOSIS — Z85828 Personal history of other malignant neoplasm of skin: Secondary | ICD-10-CM | POA: Diagnosis not present

## 2018-02-17 DIAGNOSIS — G4733 Obstructive sleep apnea (adult) (pediatric): Secondary | ICD-10-CM | POA: Diagnosis not present

## 2018-02-19 DIAGNOSIS — Z23 Encounter for immunization: Secondary | ICD-10-CM | POA: Diagnosis not present

## 2018-03-20 DIAGNOSIS — G4733 Obstructive sleep apnea (adult) (pediatric): Secondary | ICD-10-CM | POA: Diagnosis not present

## 2018-04-19 DIAGNOSIS — G4733 Obstructive sleep apnea (adult) (pediatric): Secondary | ICD-10-CM | POA: Diagnosis not present

## 2018-04-23 DIAGNOSIS — R05 Cough: Secondary | ICD-10-CM | POA: Diagnosis not present

## 2018-05-04 ENCOUNTER — Telehealth: Payer: Self-pay | Admitting: Cardiology

## 2018-05-04 NOTE — Telephone Encounter (Signed)
Rqst fwd to Dr.Jordan and his nurse Malachy Mood

## 2018-05-04 NOTE — Telephone Encounter (Signed)
Returned call to [facility] @ Dr.Webbs office. lmom on her voicemail with Dr.Jordan's response. She is to call back if any questions or concerns.

## 2018-05-04 NOTE — Telephone Encounter (Signed)
It is OK but this transition will need to be guided by whoever will be checking his coumadin.

## 2018-05-04 NOTE — Telephone Encounter (Signed)
  Pt was prescribed Xarelto and [facility] is wanting to know if it is okay for him to take Coumadin instead because it is cheaper. Pt is having issues affording the Xarelto

## 2018-05-05 ENCOUNTER — Telehealth: Payer: Self-pay | Admitting: Cardiology

## 2018-05-05 NOTE — Telephone Encounter (Signed)
Called and set patient up for Coumadin appointment.

## 2018-05-05 NOTE — Telephone Encounter (Signed)
New Message   Marzetta Board says Dr Web would like [place] to manage the coumadin, if you could assist in getting that process set up. She will let the pt know if approved  Please call

## 2018-05-17 ENCOUNTER — Ambulatory Visit (INDEPENDENT_AMBULATORY_CARE_PROVIDER_SITE_OTHER): Payer: Medicare HMO | Admitting: *Deleted

## 2018-05-17 NOTE — Patient Instructions (Signed)
Pt came in and discussed Coumadin with me and Kristen, Pharmacist. He will for now continue with Xarelto and let us know in the fall if he goes back into donut hole.

## 2018-05-20 DIAGNOSIS — G4733 Obstructive sleep apnea (adult) (pediatric): Secondary | ICD-10-CM | POA: Diagnosis not present

## 2018-06-07 DIAGNOSIS — R69 Illness, unspecified: Secondary | ICD-10-CM | POA: Diagnosis not present

## 2018-06-09 DIAGNOSIS — Z9889 Other specified postprocedural states: Secondary | ICD-10-CM | POA: Diagnosis not present

## 2018-06-09 DIAGNOSIS — Z961 Presence of intraocular lens: Secondary | ICD-10-CM | POA: Diagnosis not present

## 2018-06-09 DIAGNOSIS — H401130 Primary open-angle glaucoma, bilateral, stage unspecified: Secondary | ICD-10-CM | POA: Diagnosis not present

## 2018-06-10 DIAGNOSIS — I4821 Permanent atrial fibrillation: Secondary | ICD-10-CM | POA: Diagnosis not present

## 2018-06-10 DIAGNOSIS — E669 Obesity, unspecified: Secondary | ICD-10-CM | POA: Diagnosis not present

## 2018-06-10 DIAGNOSIS — Z6834 Body mass index (BMI) 34.0-34.9, adult: Secondary | ICD-10-CM | POA: Diagnosis not present

## 2018-06-10 DIAGNOSIS — Z125 Encounter for screening for malignant neoplasm of prostate: Secondary | ICD-10-CM | POA: Diagnosis not present

## 2018-06-10 DIAGNOSIS — J209 Acute bronchitis, unspecified: Secondary | ICD-10-CM | POA: Diagnosis not present

## 2018-06-10 DIAGNOSIS — E785 Hyperlipidemia, unspecified: Secondary | ICD-10-CM | POA: Diagnosis not present

## 2018-06-10 DIAGNOSIS — I10 Essential (primary) hypertension: Secondary | ICD-10-CM | POA: Diagnosis not present

## 2018-06-10 DIAGNOSIS — Z0000 Encounter for general adult medical examination without abnormal findings: Secondary | ICD-10-CM | POA: Diagnosis not present

## 2018-06-10 DIAGNOSIS — Z7901 Long term (current) use of anticoagulants: Secondary | ICD-10-CM | POA: Diagnosis not present

## 2018-06-10 DIAGNOSIS — G4733 Obstructive sleep apnea (adult) (pediatric): Secondary | ICD-10-CM | POA: Diagnosis not present

## 2018-06-19 DIAGNOSIS — G4733 Obstructive sleep apnea (adult) (pediatric): Secondary | ICD-10-CM | POA: Diagnosis not present

## 2018-07-18 DIAGNOSIS — G4733 Obstructive sleep apnea (adult) (pediatric): Secondary | ICD-10-CM | POA: Diagnosis not present

## 2018-07-24 ENCOUNTER — Other Ambulatory Visit (HOSPITAL_COMMUNITY): Payer: Self-pay | Admitting: Cardiology

## 2018-07-26 NOTE — Telephone Encounter (Signed)
xarelto refilled.

## 2018-07-27 DIAGNOSIS — G4733 Obstructive sleep apnea (adult) (pediatric): Secondary | ICD-10-CM | POA: Diagnosis not present

## 2018-09-01 DIAGNOSIS — H47233 Glaucomatous optic atrophy, bilateral: Secondary | ICD-10-CM | POA: Diagnosis not present

## 2018-09-01 DIAGNOSIS — H02834 Dermatochalasis of left upper eyelid: Secondary | ICD-10-CM | POA: Diagnosis not present

## 2018-09-01 DIAGNOSIS — H02883 Meibomian gland dysfunction of right eye, unspecified eyelid: Secondary | ICD-10-CM | POA: Diagnosis not present

## 2018-09-01 DIAGNOSIS — H401121 Primary open-angle glaucoma, left eye, mild stage: Secondary | ICD-10-CM | POA: Diagnosis not present

## 2018-09-01 DIAGNOSIS — H02831 Dermatochalasis of right upper eyelid: Secondary | ICD-10-CM | POA: Diagnosis not present

## 2018-09-01 DIAGNOSIS — B079 Viral wart, unspecified: Secondary | ICD-10-CM | POA: Diagnosis not present

## 2018-09-01 DIAGNOSIS — H02886 Meibomian gland dysfunction of left eye, unspecified eyelid: Secondary | ICD-10-CM | POA: Diagnosis not present

## 2018-09-01 DIAGNOSIS — H401111 Primary open-angle glaucoma, right eye, mild stage: Secondary | ICD-10-CM | POA: Diagnosis not present

## 2018-09-01 DIAGNOSIS — H11153 Pinguecula, bilateral: Secondary | ICD-10-CM | POA: Diagnosis not present

## 2018-09-14 DIAGNOSIS — Z85828 Personal history of other malignant neoplasm of skin: Secondary | ICD-10-CM | POA: Diagnosis not present

## 2018-09-14 DIAGNOSIS — E669 Obesity, unspecified: Secondary | ICD-10-CM | POA: Diagnosis not present

## 2018-09-14 DIAGNOSIS — E785 Hyperlipidemia, unspecified: Secondary | ICD-10-CM | POA: Diagnosis not present

## 2018-09-14 DIAGNOSIS — J309 Allergic rhinitis, unspecified: Secondary | ICD-10-CM | POA: Diagnosis not present

## 2018-09-14 DIAGNOSIS — Z87891 Personal history of nicotine dependence: Secondary | ICD-10-CM | POA: Diagnosis not present

## 2018-09-14 DIAGNOSIS — I4891 Unspecified atrial fibrillation: Secondary | ICD-10-CM | POA: Diagnosis not present

## 2018-09-14 DIAGNOSIS — Z7901 Long term (current) use of anticoagulants: Secondary | ICD-10-CM | POA: Diagnosis not present

## 2018-09-14 DIAGNOSIS — I10 Essential (primary) hypertension: Secondary | ICD-10-CM | POA: Diagnosis not present

## 2018-09-14 DIAGNOSIS — H40059 Ocular hypertension, unspecified eye: Secondary | ICD-10-CM | POA: Diagnosis not present

## 2018-09-14 DIAGNOSIS — Z8673 Personal history of transient ischemic attack (TIA), and cerebral infarction without residual deficits: Secondary | ICD-10-CM | POA: Diagnosis not present

## 2018-09-22 DIAGNOSIS — D485 Neoplasm of uncertain behavior of skin: Secondary | ICD-10-CM | POA: Diagnosis not present

## 2018-09-22 DIAGNOSIS — C44622 Squamous cell carcinoma of skin of right upper limb, including shoulder: Secondary | ICD-10-CM | POA: Diagnosis not present

## 2018-09-22 DIAGNOSIS — Z85828 Personal history of other malignant neoplasm of skin: Secondary | ICD-10-CM | POA: Diagnosis not present

## 2018-09-22 DIAGNOSIS — L570 Actinic keratosis: Secondary | ICD-10-CM | POA: Diagnosis not present

## 2018-10-20 DIAGNOSIS — G4733 Obstructive sleep apnea (adult) (pediatric): Secondary | ICD-10-CM | POA: Diagnosis not present

## 2018-12-10 DIAGNOSIS — D6869 Other thrombophilia: Secondary | ICD-10-CM | POA: Diagnosis not present

## 2018-12-10 DIAGNOSIS — I10 Essential (primary) hypertension: Secondary | ICD-10-CM | POA: Diagnosis not present

## 2018-12-10 DIAGNOSIS — I482 Chronic atrial fibrillation, unspecified: Secondary | ICD-10-CM | POA: Diagnosis not present

## 2018-12-10 DIAGNOSIS — E785 Hyperlipidemia, unspecified: Secondary | ICD-10-CM | POA: Diagnosis not present

## 2018-12-10 DIAGNOSIS — Z6835 Body mass index (BMI) 35.0-35.9, adult: Secondary | ICD-10-CM | POA: Diagnosis not present

## 2018-12-10 DIAGNOSIS — E669 Obesity, unspecified: Secondary | ICD-10-CM | POA: Diagnosis not present

## 2018-12-13 DIAGNOSIS — R69 Illness, unspecified: Secondary | ICD-10-CM | POA: Diagnosis not present

## 2018-12-16 DIAGNOSIS — R69 Illness, unspecified: Secondary | ICD-10-CM | POA: Diagnosis not present

## 2018-12-29 DIAGNOSIS — G4733 Obstructive sleep apnea (adult) (pediatric): Secondary | ICD-10-CM | POA: Diagnosis not present

## 2019-01-06 DIAGNOSIS — Z23 Encounter for immunization: Secondary | ICD-10-CM | POA: Diagnosis not present

## 2019-01-17 DIAGNOSIS — L821 Other seborrheic keratosis: Secondary | ICD-10-CM | POA: Diagnosis not present

## 2019-01-17 DIAGNOSIS — D1801 Hemangioma of skin and subcutaneous tissue: Secondary | ICD-10-CM | POA: Diagnosis not present

## 2019-01-17 DIAGNOSIS — L570 Actinic keratosis: Secondary | ICD-10-CM | POA: Diagnosis not present

## 2019-01-17 DIAGNOSIS — D2371 Other benign neoplasm of skin of right lower limb, including hip: Secondary | ICD-10-CM | POA: Diagnosis not present

## 2019-01-17 DIAGNOSIS — L820 Inflamed seborrheic keratosis: Secondary | ICD-10-CM | POA: Diagnosis not present

## 2019-01-17 DIAGNOSIS — L812 Freckles: Secondary | ICD-10-CM | POA: Diagnosis not present

## 2019-01-17 DIAGNOSIS — Z85828 Personal history of other malignant neoplasm of skin: Secondary | ICD-10-CM | POA: Diagnosis not present

## 2019-01-18 NOTE — Progress Notes (Signed)
Cardiology Office Note   Date:  01/19/2019   ID:  Caleb Barton, DOB 12/01/1940, MRN [id number]  PCP:  Glenis Smoker, MD  Cardiologist:   Rowan Pollman Martinique, MD   Chief Complaint  Patient presents with  . Atrial Fibrillation      History of Present Illness: Caleb Barton is a 78 y.o. male who is seen for follow up Afib.  He presented to the Afib clinic in May with new onset AFib. This was found when he was scheduled for colonoscopy. He was asymptomatic. Rate controlled on no therapy- pulse rate 60. He was anticoagulated and underwent DCCV on 10/14/16. He states he remained in NSR for only one day. Echo was unremarkable. Decision was made to continue anticoagulation and leave him in AFib since he was asymptomatic. Holter monitor was done in October 2018 to evaluate bradycardia. It showed no significant symptomatic bradycardia or pauses.   Review of his old records shows he had a  cardiac cath in 2007 by Dr. Tamala Julian. This was done for an abnormal Myoview study. Cardiac cath was normal. Interestingly the patient has no recollection of having this done. He has a history of LAFB/RBBB, HTN, OSA on CPAP, and remote thalamic CVA in 2006. Also history of HLD. Colonoscopy was done on 12/17/16 with findings of one polyp.   On follow up today he continues to feel well. He does a lot of yard work.  Denies any chest pain, palpitations, SOB. The only time he gets dizzy is when he looks up for a time then looks down.    Past Medical History:  Diagnosis Date  . 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 Dysrhythmia    RBBB  . Hypercholesteremia    takes Lipitor daily and niacin daily  . Hypertension    takes HCTZ daily and losartan daily  . Right knee DJD   . Sleep apnea    cpap;sleep study done at home;request report from dr.gates  . Stroke [facility])    TIA 15 yrs ago    Past Surgical History:  Procedure Laterality Date  . CARDIOVERSION N/A 10/14/2016   Procedure:  CARDIOVERSION;  Surgeon: Larey Dresser, MD;  [facility] ENDOSCOPY;  Service: Cardiovascular;  Laterality: N/A;  . cataracts both eyes     ioc lens  . COLONOSCOPY     with polyps  . COLONOSCOPY  08/20/2011   Procedure: COLONOSCOPY;  Surgeon: Winfield Cunas., MD;  Location: [facility] ENDOS[facility];  Service: Endoscopy;  Laterality: N/A;  . COLONOSCOPY WITH PROPOFOL N/A 12/17/2016   Procedure: COLONOSCOPY WITH PROPOFOL;  Surgeon: Laurence Spates, MD;  Location: [facility] ENDOSCOPY;  Service: Endoscopy;  Laterality: N/A;  . EYE SURGERY     laser bilateral eyes  . KNEE ARTHROSCOPY  x 2 right knee  . LUMBAR LAMINECTOMY/DECOMPRESSION MICRODISCECTOMY Right 09/09/2012   Procedure: LUMBAR LAMINECTOMY/DECOMPRESSION MICRODISCECTOMY 1 LEVEL;  Surgeon: Eustace Moore, MD;  Location: [place] NEURO ORS;  Service: Neurosurgery;  Laterality: Right;  LUMBAR LAMINECTOMY/DECOMPRESSION MICRODISCECTOMY 1 LEVEL  . TONSILLECTOMY  yrs ago  . TOTAL KNEE ARTHROPLASTY  02/09/2012   Procedure: TOTAL KNEE ARTHROPLASTY;  Surgeon: Lorn Junes, MD;  Location: [facility];  Service: Orthopedics;  Laterality: Right;     Current Outpatient Medications  Medication Sig Dispense Refill  . acetaminophen (TYLENOL) 500 MG tablet Take 1,500 mg by mouth daily as needed for headache.     [name] amoxicillin (AMOXIL) 250 MG capsule Take 1,000  mg by mouth. 1 hour prior to dental appointments    . atorvastatin (LIPITOR) 40 MG tablet Take 40 mg by mouth daily.    . chlorhexidine (PERIDEX) 0.12 % solution Use as directed 30 mLs in the mouth or throat 2 (two) times daily.     . ciprofloxacin (CIPRO) 500 MG tablet     . docusate sodium (COLACE) 100 MG capsule Take 100 mg by mouth daily as needed for moderate constipation.     . hydrochlorothiazide (HYDRODIURIL) 25 MG tablet Take 25 mg by mouth daily.    [name] latanoprost (XALATAN) 0.005 % ophthalmic solution Place 1 drop into both eyes 2 (two) times daily.     [name] losartan (COZAAR) 50 MG tablet Take 50 mg by mouth daily.     . Multiple Vitamin (MULTIVITAMIN WITH MINERALS) TABS tablet Take 1 tablet by mouth daily.    . Potassium 99 MG TABS Take 99 mg by mouth daily.    . timolol (TIMOPTIC) 0.25 % ophthalmic solution Place 1 drop into both eyes 2 (two) times daily.     Alveda Reasons 20 MG TABS tablet TAKE ONE TABLET BY MOUTH EVERY EVENING WITH SUPPER 30 tablet 5   No current facility-administered medications for this visit.     Allergies:   Lisinopril, Other, and Sulfa antibiotics    Social History:  The patient  reports that he has quit smoking. He has never used smokeless tobacco. He reports current alcohol use. He reports that he does not use drugs.   Family History:  The patient's family history includes Stroke in his mother.    ROS:  Please see the history of present illness.   Otherwise, review of systems are positive for none.   All other systems are reviewed and negative.    PHYSICAL EXAM: VS:  BP 132/90   Pulse 66   Temp 97.9 F (36.6 C)   Ht 5\' 9"  (1.753 m)   Wt 233 lb 3.2 oz (105.8 kg)   SpO2 96%   BMI 34.44 kg/m  , BMI Body mass index is 34.44 kg/m. GENERAL:  Well appearing overweight WM HEENT:  PERRL, EOMI, sclera are clear. Oropharynx is clear. NECK:  No jugular venous distention, carotid upstroke brisk and symmetric, no bruits, no thyromegaly or adenopathy LUNGS:  Clear to auscultation bilaterally CHEST:  Unremarkable HEART:  IRRR,  PMI not displaced or sustained,S1 and S2 within normal limits, no S3, no S4: no clicks, no rubs, no murmurs ABD:  Soft, nontender. BS +, no masses or bruits. No hepatomegaly, no splenomegaly EXT:  2 + pulses throughout, no edema, no cyanosis no clubbing SKIN:  Warm and dry.  No rashes NEURO:  Alert and oriented x 3. Cranial nerves II through XII intact. PSYCH:  Cognitively intact    Recent Labs: No results found for requested labs within last 8760 hours.    Lipid Panel No results found for: CHOL, TRIG, HDL, CHOLHDL, VLDL, LDLCALC, LDLDIRECT     Wt Readings from Last 3 Encounters:  01/19/19 233 lb 3.2 oz (105.8 kg)  10/28/17 230 lb (104.3 kg)  04/30/17 231 lb (104.8 kg)    Dated 05/09/16: cholesterol 148, triglycerides 108, HDL 44, LDL 82.  Dated 05/21/17: cholesterol 121, triglycerides 81, HDL 34, LDL 71. Chemistry panel normal. Dated 06/10/18: cholesterol 120, triglycerides 138, HDL 37, LDL 56. Hgb and BMET normal  Ecg today shows AFib with rate 63. LAFB, RBBB. I have personally reviewed and interpreted this study.   Other studies  Reviewed: Echo: 09/23/16: Study Conclusions  - Left ventricle: The cavity size was normal. Systolic function was   normal. The estimated ejection fraction was in the range of 55%   to 60%. Wall motion was normal; there were no regional wall   motion abnormalities. - Mitral valve: Calcified annulus. - Left atrium: The atrium was mildly dilated. - Atrial septum: No defect or patent foramen ovale was identified  Holter 02/12/17: Study Highlights    Atrial fibrillation/flutter with controlled ventricular rate. Mean HR 81 bpm  Slowest HR of 35 during sleeping hours.  Occasional PVCs and rare PVC couplets.      ASSESSMENT AND PLAN:  1.  Atrial fibrillation persistent/permanent.  S/p DCCV but did not hold. Asymptomatic. Will continue with rate control and anticoagulation.  Holter showed good rate control without significant pauses.  2. OSA on CPAP. 3. HTN controlled. 4. Remote CVA.  5. Hypercholesterolemia. We did recommend stopping niacin since there is no evidence of clinical benefit on top of statin.   Disposition:   FU with me in one year.   Signed, Haynes Giannotti Martinique, MD  01/19/2019 8:47 AM    [facility] [address] [place], [place], [address] Phone [phone], Fax [phone]

## 2019-01-19 ENCOUNTER — Other Ambulatory Visit: Payer: Self-pay

## 2019-01-19 ENCOUNTER — Encounter: Payer: Self-pay | Admitting: Cardiology

## 2019-01-19 ENCOUNTER — Ambulatory Visit: Payer: Medicare HMO | Admitting: Cardiology

## 2019-01-19 VITALS — BP 132/90 | HR 66 | Temp 97.9°F | Ht 69.0 in | Wt 233.2 lb

## 2019-01-19 DIAGNOSIS — E7800 Pure hypercholesterolemia, unspecified: Secondary | ICD-10-CM | POA: Diagnosis not present

## 2019-01-19 DIAGNOSIS — I4819 Other persistent atrial fibrillation: Secondary | ICD-10-CM | POA: Diagnosis not present

## 2019-01-19 DIAGNOSIS — I452 Bifascicular block: Secondary | ICD-10-CM | POA: Diagnosis not present

## 2019-01-19 NOTE — Patient Instructions (Signed)
You can stop niacin  Continue your other therapy

## 2019-01-25 DIAGNOSIS — Z85828 Personal history of other malignant neoplasm of skin: Secondary | ICD-10-CM | POA: Diagnosis not present

## 2019-01-25 DIAGNOSIS — C44121 Squamous cell carcinoma of skin of unspecified eyelid, including canthus: Secondary | ICD-10-CM | POA: Diagnosis not present

## 2019-01-25 DIAGNOSIS — D485 Neoplasm of uncertain behavior of skin: Secondary | ICD-10-CM | POA: Diagnosis not present

## 2019-02-02 ENCOUNTER — Other Ambulatory Visit (HOSPITAL_COMMUNITY): Payer: Self-pay | Admitting: Cardiology

## 2019-02-15 DIAGNOSIS — C441291 Squamous cell carcinoma of skin of left upper eyelid, including canthus: Secondary | ICD-10-CM | POA: Insufficient documentation

## 2019-02-16 DIAGNOSIS — C44121 Squamous cell carcinoma of skin of unspecified eyelid, including canthus: Secondary | ICD-10-CM | POA: Diagnosis not present

## 2019-02-16 DIAGNOSIS — Z85828 Personal history of other malignant neoplasm of skin: Secondary | ICD-10-CM | POA: Diagnosis not present

## 2019-02-16 DIAGNOSIS — C441291 Squamous cell carcinoma of skin of left upper eyelid, including canthus: Secondary | ICD-10-CM | POA: Diagnosis not present

## 2019-02-16 DIAGNOSIS — H0289 Other specified disorders of eyelid: Secondary | ICD-10-CM | POA: Diagnosis not present

## 2019-04-21 DIAGNOSIS — R05 Cough: Secondary | ICD-10-CM | POA: Diagnosis not present

## 2019-04-21 DIAGNOSIS — I10 Essential (primary) hypertension: Secondary | ICD-10-CM | POA: Diagnosis not present

## 2019-04-27 DIAGNOSIS — G4733 Obstructive sleep apnea (adult) (pediatric): Secondary | ICD-10-CM | POA: Diagnosis not present

## 2019-06-02 DIAGNOSIS — R69 Illness, unspecified: Secondary | ICD-10-CM | POA: Diagnosis not present

## 2019-06-06 DIAGNOSIS — Z85828 Personal history of other malignant neoplasm of skin: Secondary | ICD-10-CM | POA: Diagnosis not present

## 2019-06-06 DIAGNOSIS — L578 Other skin changes due to chronic exposure to nonionizing radiation: Secondary | ICD-10-CM | POA: Diagnosis not present

## 2019-06-06 DIAGNOSIS — H6123 Impacted cerumen, bilateral: Secondary | ICD-10-CM | POA: Diagnosis not present

## 2019-06-15 DIAGNOSIS — Z0000 Encounter for general adult medical examination without abnormal findings: Secondary | ICD-10-CM | POA: Diagnosis not present

## 2019-06-15 DIAGNOSIS — R0600 Dyspnea, unspecified: Secondary | ICD-10-CM | POA: Diagnosis not present

## 2019-06-15 DIAGNOSIS — N401 Enlarged prostate with lower urinary tract symptoms: Secondary | ICD-10-CM | POA: Diagnosis not present

## 2019-06-15 DIAGNOSIS — Z125 Encounter for screening for malignant neoplasm of prostate: Secondary | ICD-10-CM | POA: Diagnosis not present

## 2019-06-15 DIAGNOSIS — D6869 Other thrombophilia: Secondary | ICD-10-CM | POA: Diagnosis not present

## 2019-06-15 DIAGNOSIS — G4733 Obstructive sleep apnea (adult) (pediatric): Secondary | ICD-10-CM | POA: Diagnosis not present

## 2019-06-15 DIAGNOSIS — I10 Essential (primary) hypertension: Secondary | ICD-10-CM | POA: Diagnosis not present

## 2019-06-15 DIAGNOSIS — I4811 Longstanding persistent atrial fibrillation: Secondary | ICD-10-CM | POA: Diagnosis not present

## 2019-06-15 DIAGNOSIS — E785 Hyperlipidemia, unspecified: Secondary | ICD-10-CM | POA: Diagnosis not present

## 2019-06-15 DIAGNOSIS — E669 Obesity, unspecified: Secondary | ICD-10-CM | POA: Diagnosis not present

## 2019-06-15 NOTE — Progress Notes (Signed)
Cardiology Office Note:    Date:  06/16/2019   ID:  CARSON PRYOR, DOB Sep 15, 1940, MRN [id number]  PCP:  Glenis Smoker, MD  Cardiologist:  Peter Martinique, MD   Referring MD: Glenis Smoker, *   Chief Complaint  Patient presents with  . Follow-up    DOE, dizziness    History of Present Illness:    Caleb Barton is a 79 y.o. male with a hx of HTN, PAF diagnosed at colonoscopy s/p DCCV 10/14/16, echo unremarkable. Unfortunately, he converted back to rate controlled Afib after only one day - has not required rate controlling medications. Decision was made to leave him in Afib since he was rate controlled, asymptomatic, and tolerating anticoagulation. Heart monitor 03/2017 to evaluate bradycardia showed no significant symptomatic bradycardia or pauses. He has a history of normal coronaries on heart cath in 2007 (Dr. Tamala Julian) in response to abnormal myoview. Hx of LAFB/RBBB, HTN, OSA on CPAP, remote thalamic CVA in 2006, and HLD. He was last seen in clinic by Dr. Martinique 01/19/19 and was doing well at that time.   He was seen by his PCP for routine follow up and reported DOE and dizziness. He was added to my schedule this afternoon. He presents today with complaints of shortness of breath when going up stairs that started 3-4 months ago. No chest pain, diaphoresis, or nausea with the DOE. He also reports weight gain during pandemic. He has been more sedentary with chronic knee pain, left knee is bone-on-bone.   Brother was just diagnosed with bladder cancer. Prior to surgery, was diagnosed with CAD and received a stent for a 90% blockage. Brother also reported shortness of breath going upstairs prior to his stent. Pt is concerned he may also have CAD.  Pt did have a heart cath in 2007 (Dr. Tamala Julian) following an abnormal stress test (false positive).     Past Medical History:  Diagnosis Date  . 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 Dysrhythmia    RBBB  .  Hypercholesteremia    takes Lipitor daily and niacin daily  . Hypertension    takes HCTZ daily and losartan daily  . Right knee DJD   . Sleep apnea    cpap;sleep study done at home;request report from dr.gates  . Stroke [facility])    TIA 15 yrs ago    Past Surgical History:  Procedure Laterality Date  . CARDIOVERSION N/A 10/14/2016   Procedure: CARDIOVERSION;  Surgeon: Larey Dresser, MD;  Location[facility] ENDOSCOPY;  Service: Cardiovascular;  Laterality: N/A;  . cataracts both eyes     ioc lens  . COLONOSCOPY     with polyps  . COLONOSCOPY  08/20/2011   Procedure: COLONOSCOPY;  Surgeon: Winfield Cunas., MD;  Location: [facility] ENDOSCOPY;  Service: Endoscopy;  Laterality: N/A;  . COLONOSCOPY WITH PROPOFOL N/A 12/17/2016   Procedure: COLONOSCOPY WITH PROPOFOL;  Surgeon: Laurence Spates, MD;  Location: [facility] ENDOSCOPY;  Service: Endoscopy;  Laterality: N/A;  . EYE SURGERY     laser bilateral eyes  . KNEE ARTHROSCOPY  x 2 right knee  . LUMBAR LAMINECTOMY/DECOMPRESSION MICRODISCECTOMY Right 09/09/2012   Procedure: LUMBAR LAMINECTOMY/DECOMPRESSION MICRODISCECTOMY 1 LEVEL;  Surgeon: Eustace Moore, MD;  Location: [place] NEURO ORS;  Service: Neurosurgery;  Laterality: Right;  LUMBAR LAMINECTOMY/DECOMPRESSION MICRODISCECTOMY 1 LEVEL  . TONSILLECTOMY  yrs ago  . TOTAL KNEE ARTHROPLASTY  02/09/2012   Procedure: TOTAL KNEE ARTHROPLASTY;  Surgeon: Lorn Junes,  MD;  Location: [place];  Service: Orthopedics;  Laterality: Right;    Current Medications: Current Meds  Medication Sig  . acetaminophen (TYLENOL) 500 MG tablet Take 1,500 mg by mouth daily as needed for headache.   [name] amoxicillin (AMOXIL) 250 MG capsule Take 1,000 mg by mouth. 1 hour prior to dental appointments  . atorvastatin (LIPITOR) 40 MG tablet Take 40 mg by mouth daily.  . hydrochlorothiazide (HYDRODIURIL) 25 MG tablet Take 25 mg by mouth daily.  [name] latanoprost (XALATAN) 0.005 % ophthalmic solution Place 1 drop into both eyes 2 (two) times daily.    [name] losartan (COZAAR) 50 MG tablet Take 50 mg by mouth daily.  . Multiple Vitamin (MULTIVITAMIN WITH MINERALS) TABS tablet Take 1 tablet by mouth daily.  . Potassium 99 MG TABS Take 99 mg by mouth daily.  . tamsulosin (FLOMAX) 0.4 MG CAPS capsule Take 0.4 mg by mouth daily.  . timolol (TIMOPTIC) 0.25 % ophthalmic solution Place 1 drop into both eyes 2 (two) times daily.   Alveda Reasons 20 MG TABS tablet TAKE ONE TABLET BY MOUTH EVERY EVENING WITH SUPPER  . [DISCONTINUED] chlorhexidine (PERIDEX) 0.12 % solution Use as directed 30 mLs in the mouth or throat 2 (two) times daily.      Allergies:   Lisinopril, Other, and Sulfa antibiotics   Social History   Socioeconomic History  . Marital status: Married    Spouse name: Not on file  . Number of children: Not on file  . Years of education: Not on file  . Highest education level: Not on file  Occupational History  . Not on file  Tobacco Use  . Smoking status: Former Research scientist (life sciences)  . Smokeless tobacco: Never Used  . Tobacco comment: quit 35 yrs ago  Substance and Sexual Activity  . Alcohol use: Yes    Comment: social  . Drug use: No  . Sexual activity: Yes  Other Topics Concern  . Not on file  Social History Narrative  . Not on file   Social Determinants of Health   Financial Resource Strain:   . Difficulty of Paying Living Expenses: Not on file  Food Insecurity:   . Worried About Charity fundraiser in the Last Year: Not on file  . Ran Out of Food in the Last Year: Not on file  Transportation Needs:   . Lack of Transportation (Medical): Not on file  . Lack of Transportation (Non-Medical): Not on file  Physical Activity:   . Days of Exercise per Week: Not on file  . Minutes of Exercise per Session: Not on file  Stress:   . Feeling of Stress : Not on file  Social Connections:   . Frequency of Communication with Friends and Family: Not on file  . Frequency of Social Gatherings with Friends and Family: Not on file  . Attends Religious  Services: Not on file  . Active Member of Clubs or Organizations: Not on file  . Attends Archivist Meetings: Not on file  . Marital Status: Not on file     Family History: The patient's family history includes Stroke in his mother.  ROS:   Please see the history of present illness.     All other systems reviewed and are negative.  EKGs/Labs/Other Studies Reviewed:    The following studies were reviewed today:  Holter monitor 02/12/17: Looks like Afib/flutter rate is well controlled. Slow HR seem to occur only during sleeping hours. No prolonged pauses. So for now  does not need a pacemaker. Monitor for any symptoms.   Echo 09/23/16: Study Conclusions   - Left ventricle: The cavity size was normal. Systolic function was  normal. The estimated ejection fraction was in the range of 55%  to 60%. Wall motion was normal; there were no regional wall  motion abnormalities.  - Mitral valve: Calcified annulus.  - Left atrium: The atrium was mildly dilated.  - Atrial septum: No defect or patent foramen ovale was identified.   EKG:  EKG is  ordered today.  The ekg ordered today demonstrates atrial fibrillation with ventricular rate 90, RBBB, LAFB (both old)  Recent Labs: No results found for requested labs within last 8760 hours.  Recent Lipid Panel No results found for: CHOL, TRIG, HDL, CHOLHDL, VLDL, LDLCALC, LDLDIRECT  Physical Exam:    VS:  BP 128/78   Pulse 90   Ht 5\' 9"  (1.753 m)   Wt 234 lb 3.2 oz (106.2 kg)   BMI 34.59 kg/m     Wt Readings from Last 3 Encounters:  06/16/19 234 lb 3.2 oz (106.2 kg)  01/19/19 233 lb 3.2 oz (105.8 kg)  10/28/17 230 lb (104.3 kg)     GEN:  Well nourished, well developed in no acute distress HEENT: Normal NECK: No JVD; No carotid bruits LYMPHATICS: No lymphadenopathy CARDIAC: irregular rhythm, regular rate, no murmurs, rubs, gallops RESPIRATORY:  Clear to auscultation without rales, wheezing or rhonchi  ABDOMEN: Soft,  non-tender, non-distended MUSCULOSKELETAL:  No edema; No deformity  SKIN: Warm and dry NEUROLOGIC:  Alert and oriented x 3 PSYCHIATRIC:  Normal affect   ASSESSMENT:    1. Dyspnea on exertion   2. Dizziness   3. Persistent atrial fibrillation ([place])   4. Chronic anticoagulation   5. Essential hypertension   6. Hypercholesteremia    PLAN:    In order of problems listed above:  Dizziness DOE - EKG today reveals rate controlled Afib ventricular rate 90 - he does not appear hypervolemic - brother with CAD and similar symptoms - difficult situation as he has a history of false positive stress test and normal heart cath in 2007; Afib may make CT coronary difficult to gate - I will start with an echocardiogram and encouraged exercise with walking program and weight loss - case discussed with Dr. Martinique. He agrees with echo - if normal and symptoms persist, will proceed with lexiscan myoview - orthostatic vitals are normal today   Chronic atrial fibrillation Chronic anticoagulation - rate controlled without BB or CCB - although a little faster today than prior tracings - xarelto 20 mg daily - This patients CHA2DS2-VASc Score and unadjusted Ischemic Stroke Rate (% per year) is equal to 7.2 % stroke rate/year from a score of 5 (2age, HTN, 2stroke) - no bleeding problems - if symptoms persist, may also consider repeat heart monitor to make sure he is still rate controlled and to rule out RVR contributing to his DOE and dizziness   Hypertension - HCTZ 25 mg, losartan 50 mg   Hyperlipidemia with LDL goal less than 70 - continue statin   Follow up in 2-3 weeks.   Medication Adjustments/Labs and Tests Ordered: Current medicines are reviewed at length with the patient today.  Concerns regarding medicines are outlined above.  Orders Placed This Encounter  Procedures  . EKG 12-Lead  . ECHOCARDIOGRAM COMPLETE   No orders of the defined types were placed in this  encounter.   Signed, Tami Lin Amair Shrout, [place]  06/16/2019 4:25 PM  [facility]

## 2019-06-16 ENCOUNTER — Ambulatory Visit: Payer: Medicare HMO | Admitting: Physician Assistant

## 2019-06-16 ENCOUNTER — Encounter (INDEPENDENT_AMBULATORY_CARE_PROVIDER_SITE_OTHER): Payer: Self-pay

## 2019-06-16 ENCOUNTER — Other Ambulatory Visit: Payer: Self-pay

## 2019-06-16 ENCOUNTER — Encounter: Payer: Self-pay | Admitting: Physician Assistant

## 2019-06-16 VITALS — BP 128/78 | HR 90 | Ht 69.0 in | Wt 234.2 lb

## 2019-06-16 DIAGNOSIS — R0600 Dyspnea, unspecified: Secondary | ICD-10-CM

## 2019-06-16 DIAGNOSIS — R42 Dizziness and giddiness: Secondary | ICD-10-CM

## 2019-06-16 DIAGNOSIS — I10 Essential (primary) hypertension: Secondary | ICD-10-CM

## 2019-06-16 DIAGNOSIS — Z7901 Long term (current) use of anticoagulants: Secondary | ICD-10-CM | POA: Diagnosis not present

## 2019-06-16 DIAGNOSIS — I4819 Other persistent atrial fibrillation: Secondary | ICD-10-CM

## 2019-06-16 DIAGNOSIS — E7800 Pure hypercholesterolemia, unspecified: Secondary | ICD-10-CM | POA: Diagnosis not present

## 2019-06-16 DIAGNOSIS — R0609 Other forms of dyspnea: Secondary | ICD-10-CM

## 2019-06-16 NOTE — Patient Instructions (Signed)
Medication Instructions:  Continue current medications  *If you need a refill on your cardiac medications before your next appointment, please call your pharmacy*  Lab Work: None Ordered  Testing/Procedures: Your physician has requested that you have an echocardiogram. Echocardiography is a painless test that uses sound waves to create images of your heart. It provides your doctor with information about the size and shape of your heart and how well your heart's chambers and valves are working. This procedure takes approximately one hour. There are no restrictions for this procedure.  Follow-Up: At [facility], you and your health needs are our priority.  As part of our continuing mission to provide you with exceptional heart care, we have created designated Provider Care Teams.  These Care Teams include your primary Cardiologist (physician) and Advanced Practice Providers (APPs -  Physician Assistants and Nurse Practitioners) who all work together to provide you with the care you need, when you need it.  Your next appointment:   3 week(s)  The format for your next appointment:   In Person  Provider:   Peter Martinique, MD or Fabian Sharp, [facility]

## 2019-06-30 ENCOUNTER — Other Ambulatory Visit: Payer: Self-pay

## 2019-06-30 ENCOUNTER — Ambulatory Visit (HOSPITAL_COMMUNITY): Payer: Medicare HMO | Attending: Internal Medicine

## 2019-06-30 DIAGNOSIS — R0609 Other forms of dyspnea: Secondary | ICD-10-CM

## 2019-06-30 DIAGNOSIS — R0600 Dyspnea, unspecified: Secondary | ICD-10-CM

## 2019-07-04 ENCOUNTER — Encounter: Payer: Self-pay | Admitting: Physician Assistant

## 2019-07-04 ENCOUNTER — Other Ambulatory Visit: Payer: Self-pay | Admitting: *Deleted

## 2019-07-04 DIAGNOSIS — R0600 Dyspnea, unspecified: Secondary | ICD-10-CM

## 2019-07-04 DIAGNOSIS — R0609 Other forms of dyspnea: Secondary | ICD-10-CM

## 2019-07-06 DIAGNOSIS — H47233 Glaucomatous optic atrophy, bilateral: Secondary | ICD-10-CM | POA: Diagnosis not present

## 2019-07-06 DIAGNOSIS — H18413 Arcus senilis, bilateral: Secondary | ICD-10-CM | POA: Diagnosis not present

## 2019-07-06 DIAGNOSIS — H11153 Pinguecula, bilateral: Secondary | ICD-10-CM | POA: Diagnosis not present

## 2019-07-06 DIAGNOSIS — H401121 Primary open-angle glaucoma, left eye, mild stage: Secondary | ICD-10-CM | POA: Diagnosis not present

## 2019-07-06 DIAGNOSIS — H0288B Meibomian gland dysfunction left eye, upper and lower eyelids: Secondary | ICD-10-CM | POA: Diagnosis not present

## 2019-07-06 DIAGNOSIS — H401111 Primary open-angle glaucoma, right eye, mild stage: Secondary | ICD-10-CM | POA: Diagnosis not present

## 2019-07-06 DIAGNOSIS — H0288A Meibomian gland dysfunction right eye, upper and lower eyelids: Secondary | ICD-10-CM | POA: Diagnosis not present

## 2019-07-06 DIAGNOSIS — H02831 Dermatochalasis of right upper eyelid: Secondary | ICD-10-CM | POA: Diagnosis not present

## 2019-07-06 DIAGNOSIS — H16223 Keratoconjunctivitis sicca, not specified as Sjogren's, bilateral: Secondary | ICD-10-CM | POA: Diagnosis not present

## 2019-07-06 DIAGNOSIS — B079 Viral wart, unspecified: Secondary | ICD-10-CM | POA: Diagnosis not present

## 2019-07-11 ENCOUNTER — Ambulatory Visit: Payer: Medicare HMO | Admitting: Physician Assistant

## 2019-07-14 ENCOUNTER — Telehealth (HOSPITAL_COMMUNITY): Payer: Self-pay

## 2019-07-14 NOTE — Telephone Encounter (Signed)
Encounter complete. 

## 2019-07-19 ENCOUNTER — Other Ambulatory Visit: Payer: Self-pay

## 2019-07-19 ENCOUNTER — Ambulatory Visit (HOSPITAL_COMMUNITY)
Admission: RE | Admit: 2019-07-19 | Discharge: 2019-07-19 | Disposition: A | Discharge status: Home / Self Care | Payer: Medicare HMO | Source: Ambulatory Visit | Attending: Cardiovascular Disease | Admitting: Cardiovascular Disease

## 2019-07-19 DIAGNOSIS — R0600 Dyspnea, unspecified: Secondary | ICD-10-CM | POA: Insufficient documentation

## 2019-07-19 DIAGNOSIS — R0609 Other forms of dyspnea: Secondary | ICD-10-CM

## 2019-07-19 LAB — MYOCARDIAL PERFUSION IMAGING
LV dias vol: 141 mL (ref 62–150)
LV sys vol: 67 mL
Peak HR: 93 {beats}/min
Rest HR: 51 {beats}/min
SDS: 4
SRS: 9
SSS: 13
TID: 1.11

## 2019-07-19 MED ORDER — REGADENOSON 0.4 MG/5ML IV SOLN
0.4000 mg | Freq: Once | INTRAVENOUS | Status: AC
  Administered 2019-07-19: 0.4 mg via INTRAVENOUS

## 2019-07-19 MED ORDER — TECHNETIUM TC 99M TETROFOSMIN IV KIT
32.0000 | PACK | Freq: Once | INTRAVENOUS | Status: AC | PRN
  Administered 2019-07-19: 32 via INTRAVENOUS
  Filled 2019-07-19: qty 32

## 2019-07-19 MED ORDER — TECHNETIUM TC 99M TETROFOSMIN IV KIT
10.8000 | PACK | Freq: Once | INTRAVENOUS | Status: AC | PRN
  Administered 2019-07-19: 10.8 via INTRAVENOUS
  Filled 2019-07-19: qty 11

## 2019-07-26 ENCOUNTER — Telehealth: Payer: Self-pay | Admitting: Cardiology

## 2019-07-26 NOTE — Telephone Encounter (Signed)
The patient has been made aware that this is fine. They have both been vaccinated.

## 2019-07-26 NOTE — Telephone Encounter (Signed)
New Message    Pt is calling and is wondering if his wife can assist him to his appt because he is forgetful   Please advise

## 2019-07-27 ENCOUNTER — Ambulatory Visit: Payer: Medicare HMO | Admitting: Physician Assistant

## 2019-07-27 ENCOUNTER — Encounter: Payer: Self-pay | Admitting: Physician Assistant

## 2019-07-27 ENCOUNTER — Other Ambulatory Visit: Payer: Self-pay

## 2019-07-27 ENCOUNTER — Other Ambulatory Visit (HOSPITAL_COMMUNITY)
Admission: RE | Admit: 2019-07-27 | Discharge: 2019-07-27 | Disposition: A | Discharge status: Home / Self Care | Payer: Medicare HMO | Source: Ambulatory Visit | Attending: Physician Assistant | Admitting: Physician Assistant

## 2019-07-27 VITALS — BP 140/78 | HR 80 | Ht 69.0 in | Wt 235.0 lb

## 2019-07-27 DIAGNOSIS — I10 Essential (primary) hypertension: Secondary | ICD-10-CM | POA: Diagnosis not present

## 2019-07-27 DIAGNOSIS — R9439 Abnormal result of other cardiovascular function study: Secondary | ICD-10-CM | POA: Diagnosis not present

## 2019-07-27 DIAGNOSIS — Z01812 Encounter for preprocedural laboratory examination: Secondary | ICD-10-CM | POA: Insufficient documentation

## 2019-07-27 DIAGNOSIS — E7800 Pure hypercholesterolemia, unspecified: Secondary | ICD-10-CM | POA: Diagnosis not present

## 2019-07-27 DIAGNOSIS — Z20822 Contact with and (suspected) exposure to covid-19: Secondary | ICD-10-CM | POA: Insufficient documentation

## 2019-07-27 DIAGNOSIS — I4819 Other persistent atrial fibrillation: Secondary | ICD-10-CM

## 2019-07-27 LAB — SARS CORONAVIRUS 2 (TAT 6-24 HRS): SARS Coronavirus 2: NEGATIVE

## 2019-07-27 NOTE — Patient Instructions (Signed)
    [place] [address] Dept: [phone] Loc: [place]  07/27/2019  You are scheduled for a Cardiac Catheterization on Friday, April 9 with Dr. Kathlyn Sacramento.  1. Please arrive at the [facility] (Main Entrance A) at [facility]: [address] [place], [place] [address] at 8:30 AM (This time is two hours before your procedure to ensure your preparation). Free valet parking service is available.   Special note: Every effort is made to have your procedure done on time. Please understand that emergencies sometimes delay scheduled procedures.  2. Diet: Do not eat solid foods after midnight.  The patient may have clear liquids until 5am upon the day of the procedure.  3. Labs: Drawn today   4. Medication instructions in preparation for your procedure:  Stop taking Xarelto (Rivaroxaban) on Wednesday, April 7.  On the morning of your procedure, take any morning medicines NOT listed above.  You may use sips of water.  COVID Test today at 3:10 at [facility]  5. Plan for one night stay--bring personal belongings. 6. Bring a current list of your medications and current insurance cards. 7. You MUST have a responsible person to drive you home. 8. Someone MUST be with you the first 24 hours after you arrive home or your discharge will be delayed. 9. Please wear clothes that are easy to get on and off and wear slip-on shoes.  Thank you for allowing [place] to care for you!   -- [facility]

## 2019-07-27 NOTE — Progress Notes (Signed)
Cardiology Office Note   Date:  07/27/2019   ID:  RC SCHIRALDI, DOB 1941/01/27, MRN [id number]  PCP:  Glenis Smoker, MD Cardiologist:  Peter Martinique, MD 01/19/2019 Electrphysiologist: None Rosaria Ferries, PA-C   No chief complaint on file.   History of Present Illness: Caleb Barton is a 79 y.o. 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  Lexiscan was abnormal, appointment made  Caleb Barton presents for cardiology follow up.  His wife is with him.  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 But activity is not sustained, so does not get fitness from this. He was getting SOB w/ exertion, a relatively new symptom.  It may have started to improve.  He does not wake w/ LE edema, no orthopnea or PND. Compliant w/ CPAP.   No residual effects of the stroke.   Never feels the atrial fib. If he leans his head back and then comes back up, the room will spin for a few seconds.   If he stands up suddenly, he will feel dizzy as well. This is brief, no danger of falling or passing out.   He is not getting presyncope or syncope.  He never gets chest pain.  However, he does have some dyspnea on exertion, there is concern for this is an anginal equivalent.[name]  He is compliant with his medications, including the Xarelto.  He does not miss any doses of the Xarelto.  He is not having any bleeding issues on the Xarelto.  He does not check his blood pressure that often, feels that his systolic blood pressure is generally in the 130s-140s.    Past Medical History:  Diagnosis Date  . 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 Dysrhythmia    RBBB  . Hypercholesteremia    takes Lipitor daily and niacin  daily  . Hypertension    takes HCTZ daily and losartan daily  . Right knee DJD   . Sleep apnea    cpap;sleep study done at home;request report from dr.gates  . Stroke [facility])    TIA 15 yrs ago    Past Surgical History:  Procedure Laterality Date  . CARDIOVERSION N/A 10/14/2016   Procedure: CARDIOVERSION;  Surgeon: Larey Dresser, MD;  Location: [facility] ENDOSCOPY;  Service: Cardiovascular;  Laterality: N/A;  . cataracts both eyes     ioc lens  . COLONOSCOPY     with polyps  . COLONOSCOPY  08/20/2011   Procedure: COLONOSCOPY;  Surgeon: Winfield Cunas., MD;  Location: [facility] ENDOSCOPY;  Service: Endoscopy;  Laterality: N/A;  . COLONOSCOPY WITH PROPOFOL N/A 12/17/2016   Procedure: COLONOSCOPY WITH PROPOFOL;  Surgeon: Laurence Spates, MD;  Location: [facility] ENDOSCOPY;  Service: Endoscopy;  Laterality: N/A;  . EYE SURGERY     laser bilateral eyes  . KNEE ARTHROSCOPY  x 2 right knee  . LUMBAR LAMINECTOMY/DECOMPRESSION MICRODISCECTOMY Right 09/09/2012   Procedure: LUMBAR LAMINECTOMY/DECOMPRESSION MICRODISCECTOMY 1 LEVEL;  Surgeon: Eustace Moore, MD;  Location: [facility] NEURO ORS;  Service: Neurosurgery;  Laterality: Right;  LUMBAR LAMINECTOMY/DECOMPRESSION MICRODISCECTOMY 1 LEVEL  . TONSILLECTOMY  yrs ago  . TOTAL KNEE ARTHROPLASTY  02/09/2012   Procedure: TOTAL KNEE  ARTHROPLASTY;  Surgeon: Lorn Junes, MD;  Location: [place];  Service: Orthopedics;  Laterality: Right;    Current Outpatient Medications  Medication Sig Dispense Refill  . acetaminophen (TYLENOL) 500 MG tablet Take 1,500 mg by mouth daily as needed for headache.     [name] amoxicillin (AMOXIL) 250 MG capsule Take 1,000 mg by mouth. 1 hour prior to dental appointments    . atorvastatin (LIPITOR) 40 MG tablet Take 40 mg by mouth daily.    . hydrochlorothiazide (HYDRODIURIL) 25 MG tablet Take 25 mg by mouth daily.    [name] latanoprost (XALATAN) 0.005 % ophthalmic solution Place 1 drop into both eyes 2 (two) times daily.     [name] losartan (COZAAR) 50 MG  tablet Take 50 mg by mouth daily.    . Multiple Vitamin (MULTIVITAMIN WITH MINERALS) TABS tablet Take 1 tablet by mouth daily.    . Potassium 99 MG TABS Take 99 mg by mouth daily.    . tamsulosin (FLOMAX) 0.4 MG CAPS capsule Take 0.4 mg by mouth daily.    . timolol (TIMOPTIC) 0.25 % ophthalmic solution Place 1 drop into both eyes 2 (two) times daily.     Alveda Reasons 20 MG TABS tablet TAKE ONE TABLET BY MOUTH EVERY EVENING WITH SUPPER 90 tablet 4   No current facility-administered medications for this visit.    Allergies:   Lisinopril, Other, and Sulfa antibiotics    Social History:  The patient  reports that he has quit smoking. He has never used smokeless tobacco. He reports current alcohol use. He reports that he does not use drugs.   Family History:  The patient's family history includes Stroke in his mother.  He indicated that his mother is deceased. He indicated that his father is deceased. He indicated that his maternal grandmother is deceased. He indicated that his maternal grandfather is deceased. He indicated that his paternal grandmother is deceased. He indicated that his paternal grandfather is deceased.    ROS:  Please see the history of present illness. All other systems are reviewed and negative.    PHYSICAL EXAM: VS:  BP 140/78   Pulse 80   Ht 5\' 9"  (1.753 m)   Wt 235 lb (106.6 kg)   BMI 34.70 kg/m  , BMI Body mass index is 34.7 kg/m. GEN: Well nourished, well developed, male in no acute distress HEENT: normal for age  Neck: no JVD, no carotid bruit, no masses Cardiac: RRR; no murmur, no rubs, or gallops Respiratory:  clear to auscultation bilaterally, normal work of breathing GI: soft, nontender, nondistended, + BS MS: no deformity or atrophy; no edema; distal pulses are 2+ in all 4 extremities  Skin: warm and dry, no rash Neuro:  Strength and sensation are intact Psych: euthymic mood, full affect   EKG:  EKG is ordered today. 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 Left ventricular ejection fraction, by estimation, is 55 to 60%. The  left ventricle has normal function. The left ventricle has no regional  wall motion abnormalities. There is indeterminate left ventricular  hypertrophy. Left ventricular diastolic  parameters are indeterminate.  2. Right ventricular systolic function is normal. The right ventricular  size is normal. Tricuspid regurgitation signal is inadequate for assessing  PA pressure.  3. Left atrial size was moderately dilated.  4. Right atrial size was moderately dilated.  5. The mitral valve is  normal in structure. No evidence of mitral valve  regurgitation. No evidence of mitral stenosis.  6. The aortic valve was not well visualized. Aortic valve regurgitation  is not visualized. No aortic stenosis is present.  7. Aortic dilatation noted. 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  8. The inferior vena cava is normal in size with greater than 50%  respiratory variability, suggesting right atrial pressure of 3 mmHg.   CATH: 01/08/2006 RESULTS:  1. Hemodynamic data:      a.     The aortic pressure was 37/79.      b.     Left ventricular pressure 137/10 mmHg.   1. Left ventriculography:  Left ventricular cavity size and systolic      function are normal.  Ejection fraction is at least 60%.  No mitral      regurgitation is identified.   1. Coronary angiography:  The left coronary contains very minimal      calcification.      a.     Left main coronary:  Widely patent distal left main       calcification is seen.      b.     Left anterior descending coronary:  The LAD is relatively small,       giving origin to a large bifurcating first diagonal.  There is also a       large first septal perforator and the LAD is  relatively small coursing       to the distal anterior wall.  It does not wrap around the left       ventricle apex.  Minimal luminal irregularities noted in the LAD.       There is mid LAD systolic compression.      c.     Circumflex artery:  The circumflex coronary artery is relatively       small, giving origin to three obtuse marginal branches and are free of       any significant obstruction.      d.     Ramus intermedius branch:  A moderate-sized ramus branch arises       from the distal left main and is free of any significant obstruction.      e.     Right coronary artery:  Right coronary artery is a large       dominant vessel, large PDA, large left ventricular branches.  Minimal       luminal irregularities are noted.   CONCLUSION:  1. Essentially normal coronary arteries.  2. Overall normal left ventricular function.  3. False positive Cardiolite study.   MYOVIEW: 07/19/2019  The left ventricular ejection fraction is mildly decreased (45-54%).  Nuclear stress EF: 53%.  There was no ST segment deviation noted during stress.  Defect 1: There is a large defect of moderate severity present in the basal anteroseptal, basal inferoseptal, basal inferior, basal inferolateral, mid inferoseptal, mid inferior and apical inferior location.  This is an intermediate risk study.  No prior study for comparison.   EF low normal without specific wall motion abnormalities seen, but there is a large fixed defect in the inferior/inferoseptal walls from base to apex, extending to anteroseptal/inferolateral areas at the base. This does not change significantly with stress imaging. There is no clear diaphragmatic attenuation to explain findings. There are notes re: abnormal myoview with normal cath in 2007, but I cannot see  this study to compare. Intermediate risk given size of area, but no ischemia seen.   Recent Labs: No results found for requested labs within last 8760 hours.  CBC     Component Value Date/Time   WBC 4.7 10/14/2016 1142   RBC 4.55 10/14/2016 1142   HGB 14.5 10/14/2016 1142   HCT 42.7 10/14/2016 1142   PLT 179 10/14/2016 1142   MCV 93.8 10/14/2016 1142   MCH 31.9 10/14/2016 1142   MCHC 34.0 10/14/2016 1142   RDW 13.2 10/14/2016 1142   LYMPHSABS 1.5 09/09/2012 0900   MONOABS 0.6 09/09/2012 0900   EOSABS 0.6 09/09/2012 0900   BASOSABS 0.0 09/09/2012 0900   CMP Latest Ref Rng & Units 10/14/2016 09/06/2012 02/10/2012  Glucose 65 - 99 mg/dL 92 86 137(H)  BUN 6 - 20 mg/dL 12 14 12   Creatinine 0.61 - 1.24 mg/dL 0.84 0.84 0.80  Sodium 135 - 145 mmol/L 141 139 138  Potassium 3.5 - 5.1 mmol/L 3.7 3.5 3.6  Chloride 101 - 111 mmol/L 107 100 104  CO2 22 - 32 mmol/L 30 28 24   Calcium 8.9 - 10.3 mg/dL 8.9 9.2 8.1(L)  Total Protein 6.0 - 8.3 g/dL - - -  Total Bilirubin 0.3 - 1.2 mg/dL - - -  Alkaline Phos 39 - 117 U/L - - -  AST 0 - 37 U/L - - -  ALT 0 - 53 U/L - - -     Lipid Panel No results found for: CHOL, HDL, LDLCALC, LDLDIRECT, TRIG, CHOLHDL    Wt Readings from Last 3 Encounters:  07/27/19 235 lb (106.6 kg)  07/19/19 234 lb (106.1 kg)  06/16/19 234 lb 3.2 oz (106.2 kg)     Other studies Reviewed: Additional studies/ records that were reviewed today include: Office notes, hospital records and testing.  ASSESSMENT AND PLAN:  1.  Abnormal stress test: -He has family history of coronary artery disease, and his brother who recently got a stent -He has dyspnea on exertion that may represent an anginal equivalent -Myoview showed a large scar, concerning for proximal vessel disease - Cardiac catheterization was discussed with the patient fully. The patient understands that risks include but are not limited to stroke (1 in 1000), death (1 in 74), kidney failure [usually temporary] (1 in 500), bleeding (1 in 200), allergic reaction [possibly serious] (1 in 200).  The patient understands and is willing to proceed.    His wife is in agreement with the  plan  2.  Permanent atrial fibrillation -He is not on any rate lowering medications, his heart rate is controlled -He is compliant with the Xarelto, but will have to hold it for 2 days prior to the procedure -He is borderline with needing cross coverage, pharmacy consulted and feels he will be okay to hold it for 2 days, he needs to restarted as soon as possible after the cath. -Can start Lovenox tomorrow if Dr. Martinique feels this is indicated  3.  Hypertension: - blood pressure is on the high side of normal. -If his renal function is normal, can increase the losartan to 75 mg daily after the cath  4.  Hyperlipidemia: -Lipids are followed by his PCP, LDL was 56 in 2020, other cholesterol values were done in 2021, but LDL is not listed   Current medicines are reviewed at length with the patient today.  The patient does not have concerns regarding medicines.  The following changes have been made: Hold Xarelto for procedure, if renal function  is normal, consider increasing losartan after the cath to 75 mg daily  Labs/ tests ordered today include:  No orders of the defined types were placed in this encounter.    Disposition:   FU with Peter Martinique, MD  Signed, Rosaria Ferries, PA-C  07/27/2019 2:40 PM    [place] Phone: [phone]; Fax: [phone]   I do not she is back from lunch sorry

## 2019-07-27 NOTE — H&P (View-Only) (Signed)
Cardiology Office Note   Date:  07/27/2019   ID:  Caleb Barton, DOB December 27, 1940, MRN [id number]  PCP:  Glenis Smoker, MD Cardiologist:  Peter Martinique, MD 01/19/2019 Electrphysiologist: None Rosaria Ferries, PA-C   No chief complaint on file.   History of Present Illness: Caleb Barton is a 79 y.o. 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  Lexiscan was abnormal, appointment made  Caleb Barton presents for cardiology follow up.  His wife is with him.  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 But activity is not sustained, so does not get fitness from this. He was getting SOB w/ exertion, a relatively new symptom.  It may have started to improve.  He does not wake w/ LE edema, no orthopnea or PND. Compliant w/ CPAP.   No residual effects of the stroke.   Never feels the atrial fib. If he leans his head back and then comes back up, the room will spin for a few seconds.   If he stands up suddenly, he will feel dizzy as well. This is brief, no danger of falling or passing out.   He is not getting presyncope or syncope.  He never gets chest pain.  However, he does have some dyspnea on exertion, there is concern for this is an anginal equivalent.Caleb Barton  He is compliant with his medications, including the Xarelto.  He does not miss any doses of the Xarelto.  He is not having any bleeding issues on the Xarelto.  He does not check his blood pressure that often, feels that his systolic blood pressure is generally in the 130s-140s.    Past Medical History:  Diagnosis Date  . 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 Dysrhythmia    RBBB  . Hypercholesteremia    takes Lipitor daily and niacin  daily  . Hypertension    takes HCTZ daily and losartan daily  . Right knee DJD   . Sleep apnea    cpap;sleep study done at home;request report from dr.gates  . Stroke [facility])    TIA 15 yrs ago    Past Surgical History:  Procedure Laterality Date  . CARDIOVERSION N/A 10/14/2016   Procedure: CARDIOVERSION;  Surgeon: Larey Dresser, MD;  Location: [facility] ENDOSCOPY;  Service: Cardiovascular;  Laterality: N/A;  . cataracts both eyes     ioc lens  . COLONOSCOPY     with polyps  . COLONOSCOPY  08/20/2011   Procedure: COLONOSCOPY;  Surgeon: Winfield Cunas., MD;  Location: [facility] END[facility];  Service: Endoscopy;  Laterality: N/A;  . COLONOSCOPY WITH PROPOFOL N/A 12/17/2016   Procedure: COLONOSCOPY WITH PROPOFOL;  Surgeon: Laurence Spates, MD;  Location: [facility] ENDOSCOPY;  Service: Endoscopy;  Laterality: N/A;  . EYE SURGERY     laser bilateral eyes  . KNEE ARTHROSCOPY  x 2 right knee  . LUMBAR LAMINECTOMY/DECOMPRESSION MICRODISCECTOMY Right 09/09/2012   Procedure: LUMBAR LAMINECTOMY/DECOMPRESSION MICRODISCECTOMY 1 LEVEL;  Surgeon: Eustace Moore, MD;  Location: [place] NEURO ORS;  Service: Neurosurgery;  Laterality: Right;  LUMBAR LAMINECTOMY/DECOMPRESSION MICRODISCECTOMY 1 LEVEL  . TONSILLECTOMY  yrs ago  . TOTAL KNEE ARTHROPLASTY  02/09/2012   Procedure: TOTAL KNEE  ARTHROPLASTY;  Surgeon: Lorn Junes, MD;  Location: [place];  Service: Orthopedics;  Laterality: Right;    Current Outpatient Medications  Medication Sig Dispense Refill  . acetaminophen (TYLENOL) 500 MG tablet Take 1,500 mg by mouth daily as needed for headache.     Caleb Barton amoxicillin (AMOXIL) 250 MG capsule Take 1,000 mg by mouth. 1 hour prior to dental appointments    . atorvastatin (LIPITOR) 40 MG tablet Take 40 mg by mouth daily.    . hydrochlorothiazide (HYDRODIURIL) 25 MG tablet Take 25 mg by mouth daily.    Caleb Barton latanoprost (XALATAN) 0.005 % ophthalmic solution Place 1 drop into both eyes 2 (two) times daily.     Caleb Barton losartan (COZAAR) 50 MG  tablet Take 50 mg by mouth daily.    . Multiple Vitamin (MULTIVITAMIN WITH MINERALS) TABS tablet Take 1 tablet by mouth daily.    . Potassium 99 MG TABS Take 99 mg by mouth daily.    . tamsulosin (FLOMAX) 0.4 MG CAPS capsule Take 0.4 mg by mouth daily.    . timolol (TIMOPTIC) 0.25 % ophthalmic solution Place 1 drop into both eyes 2 (two) times daily.     Alveda Reasons 20 MG TABS tablet TAKE ONE TABLET BY MOUTH EVERY EVENING WITH SUPPER 90 tablet 4   No current facility-administered medications for this visit.    Allergies:   Lisinopril, Other, and Sulfa antibiotics    Social History:  The patient  reports that he has quit smoking. He has never used smokeless tobacco. He reports current alcohol use. He reports that he does not use drugs.   Family History:  The patient's family history includes Stroke in his mother.  He indicated that his mother is deceased. He indicated that his father is deceased. He indicated that his maternal grandmother is deceased. He indicated that his maternal grandfather is deceased. He indicated that his paternal grandmother is deceased. He indicated that his paternal grandfather is deceased.    ROS:  Please see the history of present illness. All other systems are reviewed and negative.    PHYSICAL EXAM: VS:  BP 140/78   Pulse 80   Ht 5\' 9"  (1.753 m)   Wt 235 lb (106.6 kg)   BMI 34.70 kg/m  , BMI Body mass index is 34.7 kg/m. GEN: Well nourished, well developed, male in no acute distress HEENT: normal for age  Neck: no JVD, no carotid bruit, no masses Cardiac: RRR; no murmur, no rubs, or gallops Respiratory:  clear to auscultation bilaterally, normal work of breathing GI: soft, nontender, nondistended, + BS MS: no deformity or atrophy; no edema; distal pulses are 2+ in all 4 extremities  Skin: warm and dry, no rash Neuro:  Strength and sensation are intact Psych: euthymic mood, full affect   EKG:  EKG is ordered today. 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 Left ventricular ejection fraction, by estimation, is 55 to 60%. The  left ventricle has normal function. The left ventricle has no regional  wall motion abnormalities. There is indeterminate left ventricular  hypertrophy. Left ventricular diastolic  parameters are indeterminate.  2. Right ventricular systolic function is normal. The right ventricular  size is normal. Tricuspid regurgitation signal is inadequate for assessing  PA pressure.  3. Left atrial size was moderately dilated.  4. Right atrial size was moderately dilated.  5. The mitral valve is  normal in structure. No evidence of mitral valve  regurgitation. No evidence of mitral stenosis.  6. The aortic valve was not well visualized. Aortic valve regurgitation  is not visualized. No aortic stenosis is present.  7. Aortic dilatation noted. 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  8. The inferior vena cava is normal in size with greater than 50%  respiratory variability, suggesting right atrial pressure of 3 mmHg.   CATH: 01/08/2006 RESULTS:  1. Hemodynamic data:      a.     The aortic pressure was 37/79.      b.     Left ventricular pressure 137/10 mmHg.   1. Left ventriculography:  Left ventricular cavity size and systolic      function are normal.  Ejection fraction is at least 60%.  No mitral      regurgitation is identified.   1. Coronary angiography:  The left coronary contains very minimal      calcification.      a.     Left main coronary:  Widely patent distal left main       calcification is seen.      b.     Left anterior descending coronary:  The LAD is relatively small,       giving origin to a large bifurcating first diagonal.  There is also a       large first septal perforator and the LAD is  relatively small coursing       to the distal anterior wall.  It does not wrap around the left       ventricle apex.  Minimal luminal irregularities noted in the LAD.       There is mid LAD systolic compression.      c.     Circumflex artery:  The circumflex coronary artery is relatively       small, giving origin to three obtuse marginal branches and are free of       any significant obstruction.      d.     Ramus intermedius branch:  A moderate-sized ramus branch arises       from the distal left main and is free of any significant obstruction.      e.     Right coronary artery:  Right coronary artery is a large       dominant vessel, large PDA, large left ventricular branches.  Minimal       luminal irregularities are noted.   CONCLUSION:  1. Essentially normal coronary arteries.  2. Overall normal left ventricular function.  3. False positive Cardiolite study.   MYOVIEW: 07/19/2019  The left ventricular ejection fraction is mildly decreased (45-54%).  Nuclear stress EF: 53%.  There was no ST segment deviation noted during stress.  Defect 1: There is a large defect of moderate severity present in the basal anteroseptal, basal inferoseptal, basal inferior, basal inferolateral, mid inferoseptal, mid inferior and apical inferior location.  This is an intermediate risk study.  No prior study for comparison.   EF low normal without specific wall motion abnormalities seen, but there is a large fixed defect in the inferior/inferoseptal walls from base to apex, extending to anteroseptal/inferolateral areas at the base. This does not change significantly with stress imaging. There is no clear diaphragmatic attenuation to explain findings. There are notes re: abnormal myoview with normal cath in 2007, but I cannot see  this study to compare. Intermediate risk given size of area, but no ischemia seen.   Recent Labs: No results found for requested labs within last 8760 hours.  CBC      Component Value Date/Time   WBC 4.7 10/14/2016 1142   RBC 4.55 10/14/2016 1142   HGB 14.5 10/14/2016 1142   HCT 42.7 10/14/2016 1142   PLT 179 10/14/2016 1142   MCV 93.8 10/14/2016 1142   MCH 31.9 10/14/2016 1142   MCHC 34.0 10/14/2016 1142   RDW 13.2 10/14/2016 1142   LYMPHSABS 1.5 09/09/2012 0900   MONOABS 0.6 09/09/2012 0900   EOSABS 0.6 09/09/2012 0900   BASOSABS 0.0 09/09/2012 0900   CMP Latest Ref Rng & Units 10/14/2016 09/06/2012 02/10/2012  Glucose 65 - 99 mg/dL 92 86 137(H)  BUN 6 - 20 mg/dL 12 14 12   Creatinine 0.61 - 1.24 mg/dL 0.84 0.84 0.80  Sodium 135 - 145 mmol/L 141 139 138  Potassium 3.5 - 5.1 mmol/L 3.7 3.5 3.6  Chloride 101 - 111 mmol/L 107 100 104  CO2 22 - 32 mmol/L 30 28 24   Calcium 8.9 - 10.3 mg/dL 8.9 9.2 8.1(L)  Total Protein 6.0 - 8.3 g/dL - - -  Total Bilirubin 0.3 - 1.2 mg/dL - - -  Alkaline Phos 39 - 117 U/L - - -  AST 0 - 37 U/L - - -  ALT 0 - 53 U/L - - -     Lipid Panel No results found for: CHOL, HDL, LDLCALC, LDLDIRECT, TRIG, CHOLHDL    Wt Readings from Last 3 Encounters:  07/27/19 235 lb (106.6 kg)  07/19/19 234 lb (106.1 kg)  06/16/19 234 lb 3.2 oz (106.2 kg)     Other studies Reviewed: Additional studies/ records that were reviewed today include: Office notes, hospital records and testing.  ASSESSMENT AND PLAN:  1.  Abnormal stress test: -He has family history of coronary artery disease, and his brother who recently got a stent -He has dyspnea on exertion that may represent an anginal equivalent -Myoview showed a large scar, concerning for proximal vessel disease - Cardiac catheterization was discussed with the patient fully. The patient understands that risks include but are not limited to stroke (1 in 1000), death (1 in 79), kidney failure [usually temporary] (1 in 500), bleeding (1 in 200), allergic reaction [possibly serious] (1 in 200).  The patient understands and is willing to proceed.    His wife is in agreement with  the plan  2.  Permanent atrial fibrillation -He is not on any rate lowering medications, his heart rate is controlled -He is compliant with the Xarelto, but will have to hold it for 2 days prior to the procedure -He is borderline with needing cross coverage, pharmacy consulted and feels he will be okay to hold it for 2 days, he needs to restarted as soon as possible after the cath. -Can start Lovenox tomorrow if Dr. Martinique feels this is indicated  3.  Hypertension: - blood pressure is on the high side of normal. -If his renal function is normal, can increase the losartan to 75 mg daily after the cath  4.  Hyperlipidemia: -Lipids are followed by his PCP, LDL was 56 in 2020, other cholesterol values were done in 2021, but LDL is not listed   Current medicines are reviewed at length with the patient today.  The patient does not have concerns regarding medicines.  The following changes have been made: Hold Xarelto for procedure, if renal  function is normal, consider increasing losartan after the cath to 75 mg daily  Labs/ tests ordered today include:  No orders of the defined types were placed in this encounter.    Disposition:   FU with Peter Martinique, MD  Signed, Rosaria Ferries, PA-C  07/27/2019 2:40 PM    [place] Phone: [phone]; Fax: [phone]   I do not she is back from lunch sorry

## 2019-07-28 ENCOUNTER — Telehealth: Payer: Self-pay | Admitting: *Deleted

## 2019-07-28 LAB — CBC
Hematocrit: 47.6 % (ref 37.5–51.0)
Hemoglobin: 16.4 g/dL (ref 13.0–17.7)
MCH: 32.5 pg (ref 26.6–33.0)
MCHC: 34.5 g/dL (ref 31.5–35.7)
MCV: 94 fL (ref 79–97)
Platelets: 231 10*3/uL (ref 150–450)
RBC: 5.04 x10E6/uL (ref 4.14–5.80)
RDW: 12.5 % (ref 11.6–15.4)
WBC: 6.2 10*3/uL (ref 3.4–10.8)

## 2019-07-28 LAB — BASIC METABOLIC PANEL
BUN/Creatinine Ratio: 13 (ref 10–24)
BUN: 11 mg/dL (ref 8–27)
CO2: 28 mmol/L (ref 20–29)
Calcium: 9.9 mg/dL (ref 8.6–10.2)
Chloride: 101 mmol/L (ref 96–106)
Creatinine, Ser: 0.85 mg/dL (ref 0.76–1.27)
GFR calc Af Amer: 98 mL/min/{1.73_m2} (ref >59)
GFR calc non Af Amer: 85 mL/min/{1.73_m2} (ref >59)
Glucose: 91 mg/dL (ref 65–99)
Potassium: 4.2 mmol/L (ref 3.5–5.2)
Sodium: 143 mmol/L (ref 134–144)

## 2019-07-28 NOTE — Telephone Encounter (Addendum)
Pt contacted pre-catheterization scheduled at [facility] for: Friday July 29, 2019 10:30 AM Verified arrival time and place: [facility]) at: 8:30 AM   No solid food after midnight prior to cath, clear liquids until 5 AM day of procedure.  Hold: Xarelto-none 07/27/19 until post procedure HCTZ- AM of procedure  Except hold medications AM meds can be  taken pre-cath with sip of water including: ASA 81 mg   Confirmed patient has responsible adult to drive home post procedure and observe 24 hours after arriving home: yes  Currently, due to Covid-19 pandemic, only one person will be allowed with patient. Must be the same person for patient's entire stay and will be required to wear a mask. They will be asked to wait in the waiting room for the duration of the patient's stay.  Patients are required to wear a mask when they enter the hospital.      COVID-19 Pre-Screening Questions:  . In the past 7 to 10 days have you had a cough,  shortness of breath, headache, congestion, fever (100 or greater) body aches, chills, sore throat, or sudden loss of taste or sense of smell? no . Have you been around anyone with known Covid 19 in the past 7 to 10 days? no . Have you been around anyone who is awaiting Covid 19 test results in the past 7 to 10 days? no . Have you been around anyone who has mentioned symptoms of Covid 19 within the past 7 to 10 days? no  Reviewed procedure/mask/visitor instructions, COVID-19 screening questions with patient.

## 2019-07-29 ENCOUNTER — Encounter (HOSPITAL_COMMUNITY)
Admission: RE | Disposition: A | Discharge status: Home / Self Care | Payer: Self-pay | Source: Ambulatory Visit | Attending: Cardiovascular Disease

## 2019-07-29 ENCOUNTER — Ambulatory Visit (HOSPITAL_COMMUNITY)
Admission: RE | Admit: 2019-07-29 | Discharge: 2019-07-29 | Disposition: A | Discharge status: Home / Self Care | Payer: Medicare HMO | Source: Ambulatory Visit | Attending: Cardiovascular Disease | Admitting: Cardiovascular Disease

## 2019-07-29 ENCOUNTER — Other Ambulatory Visit: Payer: Self-pay

## 2019-07-29 DIAGNOSIS — Z87891 Personal history of nicotine dependence: Secondary | ICD-10-CM | POA: Diagnosis not present

## 2019-07-29 DIAGNOSIS — R9439 Abnormal result of other cardiovascular function study: Secondary | ICD-10-CM

## 2019-07-29 DIAGNOSIS — E785 Hyperlipidemia, unspecified: Secondary | ICD-10-CM | POA: Diagnosis not present

## 2019-07-29 DIAGNOSIS — E7800 Pure hypercholesterolemia, unspecified: Secondary | ICD-10-CM | POA: Insufficient documentation

## 2019-07-29 DIAGNOSIS — I2510 Atherosclerotic heart disease of native coronary artery without angina pectoris: Secondary | ICD-10-CM | POA: Insufficient documentation

## 2019-07-29 DIAGNOSIS — R0600 Dyspnea, unspecified: Secondary | ICD-10-CM

## 2019-07-29 DIAGNOSIS — Z888 Allergy status to other drugs, medicaments and biological substances status: Secondary | ICD-10-CM | POA: Insufficient documentation

## 2019-07-29 DIAGNOSIS — G4733 Obstructive sleep apnea (adult) (pediatric): Secondary | ICD-10-CM | POA: Diagnosis not present

## 2019-07-29 DIAGNOSIS — I4821 Permanent atrial fibrillation: Secondary | ICD-10-CM | POA: Diagnosis not present

## 2019-07-29 DIAGNOSIS — Z882 Allergy status to sulfonamides status: Secondary | ICD-10-CM | POA: Diagnosis not present

## 2019-07-29 DIAGNOSIS — I10 Essential (primary) hypertension: Secondary | ICD-10-CM | POA: Insufficient documentation

## 2019-07-29 DIAGNOSIS — I4510 Unspecified right bundle-branch block: Secondary | ICD-10-CM | POA: Insufficient documentation

## 2019-07-29 DIAGNOSIS — Z7901 Long term (current) use of anticoagulants: Secondary | ICD-10-CM | POA: Diagnosis not present

## 2019-07-29 DIAGNOSIS — R0609 Other forms of dyspnea: Secondary | ICD-10-CM

## 2019-07-29 DIAGNOSIS — Z8673 Personal history of transient ischemic attack (TIA), and cerebral infarction without residual deficits: Secondary | ICD-10-CM | POA: Insufficient documentation

## 2019-07-29 DIAGNOSIS — Z79899 Other long term (current) drug therapy: Secondary | ICD-10-CM | POA: Diagnosis not present

## 2019-07-29 DIAGNOSIS — R9430 Abnormal result of cardiovascular function study, unspecified: Secondary | ICD-10-CM | POA: Diagnosis present

## 2019-07-29 HISTORY — PX: LEFT HEART CATH AND CORONARY ANGIOGRAPHY: CATH118249

## 2019-07-29 SURGERY — LEFT HEART CATH AND CORONARY ANGIOGRAPHY
Anesthesia: LOCAL

## 2019-07-29 MED ORDER — SODIUM CHLORIDE 0.9 % IV SOLN: 250.0000 mL | INTRAVENOUS | Status: DC | PRN

## 2019-07-29 MED ORDER — SODIUM CHLORIDE 0.9% FLUSH: 3.0000 mL | Freq: Two times a day (BID) | INTRAVENOUS | Status: DC

## 2019-07-29 MED ORDER — VERAPAMIL HCL 2.5 MG/ML IV SOLN
INTRAVENOUS | Status: DC | PRN
  Administered 2019-07-29: 10 mL via INTRA_ARTERIAL

## 2019-07-29 MED ORDER — ACETAMINOPHEN 325 MG PO TABS: 650.0000 mg | ORAL_TABLET | ORAL | Status: DC | PRN

## 2019-07-29 MED ORDER — SODIUM CHLORIDE 0.9% FLUSH: 3.0000 mL | INTRAVENOUS | Status: DC | PRN

## 2019-07-29 MED ORDER — HEPARIN (PORCINE) IN NACL 1000-0.9 UT/500ML-% IV SOLN
INTRAVENOUS | Status: AC
  Filled 2019-07-29: qty 1000

## 2019-07-29 MED ORDER — LABETALOL HCL 5 MG/ML IV SOLN: 10.0000 mg | INTRAVENOUS | Status: DC | PRN

## 2019-07-29 MED ORDER — SODIUM CHLORIDE 0.9 % IV SOLN: INTRAVENOUS | Status: DC

## 2019-07-29 MED ORDER — IOHEXOL 350 MG/ML SOLN
INTRAVENOUS | Status: DC | PRN
  Administered 2019-07-29: 70 mL via INTRA_ARTERIAL

## 2019-07-29 MED ORDER — MIDAZOLAM HCL 2 MG/2ML IJ SOLN
INTRAMUSCULAR | Status: AC
  Filled 2019-07-29: qty 2

## 2019-07-29 MED ORDER — HEPARIN (PORCINE) IN NACL 1000-0.9 UT/500ML-% IV SOLN
INTRAVENOUS | Status: DC | PRN
  Administered 2019-07-29 (×2): 500 mL

## 2019-07-29 MED ORDER — FENTANYL CITRATE (PF) 100 MCG/2ML IJ SOLN
INTRAMUSCULAR | Status: AC
  Filled 2019-07-29: qty 2

## 2019-07-29 MED ORDER — ASPIRIN 81 MG PO CHEW: 81.0000 mg | CHEWABLE_TABLET | ORAL | Status: DC

## 2019-07-29 MED ORDER — LIDOCAINE HCL (PF) 1 % IJ SOLN
INTRAMUSCULAR | Status: DC | PRN
  Administered 2019-07-29: 2 mL

## 2019-07-29 MED ORDER — HEPARIN SODIUM (PORCINE) 1000 UNIT/ML IJ SOLN
INTRAMUSCULAR | Status: DC | PRN
  Administered 2019-07-29: 5000 [IU] via INTRAVENOUS

## 2019-07-29 MED ORDER — HEPARIN SODIUM (PORCINE) 1000 UNIT/ML IJ SOLN
INTRAMUSCULAR | Status: AC
  Filled 2019-07-29: qty 1

## 2019-07-29 MED ORDER — FENTANYL CITRATE (PF) 100 MCG/2ML IJ SOLN
INTRAMUSCULAR | Status: DC | PRN
  Administered 2019-07-29: 25 ug via INTRAVENOUS

## 2019-07-29 MED ORDER — SODIUM CHLORIDE 0.9 % WEIGHT BASED INFUSION: 1.0000 mL/kg/h | INTRAVENOUS | Status: DC

## 2019-07-29 MED ORDER — SODIUM CHLORIDE 0.9 % WEIGHT BASED INFUSION: 3.0000 mL/kg/h | INTRAVENOUS | Status: AC

## 2019-07-29 MED ORDER — VERAPAMIL HCL 2.5 MG/ML IV SOLN
INTRAVENOUS | Status: AC
  Filled 2019-07-29: qty 2

## 2019-07-29 MED ORDER — LIDOCAINE HCL (PF) 1 % IJ SOLN
INTRAMUSCULAR | Status: AC
  Filled 2019-07-29: qty 30

## 2019-07-29 MED ORDER — ONDANSETRON HCL 4 MG/2ML IJ SOLN: 4.0000 mg | Freq: Four times a day (QID) | INTRAMUSCULAR | Status: DC | PRN

## 2019-07-29 MED ORDER — MIDAZOLAM HCL 2 MG/2ML IJ SOLN
INTRAMUSCULAR | Status: DC | PRN
  Administered 2019-07-29: 1 mg via INTRAVENOUS

## 2019-07-29 SURGICAL SUPPLY — 11 items
CATH INFINITI 5 FR JL3.5 (CATHETERS) ×2 IMPLANT
CATH INFINITI 5FR JK (CATHETERS) ×2 IMPLANT
DEVICE RAD COMP TR BAND LRG (VASCULAR PRODUCTS) ×2 IMPLANT
GLIDESHEATH SLEND SS 6F .021 (SHEATH) ×2 IMPLANT
GUIDEWIRE INQWIRE 1.5J.035X260 (WIRE) ×1 IMPLANT
INQWIRE 1.5J .035X260CM (WIRE) ×2
KIT HEART LEFT (KITS) ×2 IMPLANT
PACK CARDIAC CATHETERIZATION (CUSTOM PROCEDURE TRAY) ×2 IMPLANT
TRANSDUCER W/STOPCOCK (MISCELLANEOUS) ×2 IMPLANT
TUBING CIL FLEX 10 FLL-RA (TUBING) ×2 IMPLANT
WIRE HI TORQ VERSACORE-J 145CM (WIRE) ×2 IMPLANT

## 2019-07-29 NOTE — Interval H&P Note (Signed)
Cath Lab Visit (complete for each Cath Lab visit)  Clinical Evaluation Leading to the Procedure:   ACS: No.  Non-ACS:    Anginal Classification: CCS III  Anti-ischemic medical therapy: No Therapy  Non-Invasive Test Results: Intermediate-risk stress test findings: cardiac mortality 1-3%/year  Prior CABG: No previous CABG      History and Physical Interval Note:  07/29/2019 11:16 AM  Caleb Barton  has presented today for surgery, with the diagnosis of abnormal stress test.  The various methods of treatment have been discussed with the patient and family. After consideration of risks, benefits and other options for treatment, the patient has consented to  Procedure(s): LEFT HEART CATH AND CORONARY ANGIOGRAPHY (N/A) as a surgical intervention.  The patient's history has been reviewed, patient examined, no change in status, stable for surgery.  I have reviewed the patient's chart and labs.  Questions were answered to the patient's satisfaction.     Kathlyn Sacramento

## 2019-07-29 NOTE — Discharge Instructions (Addendum)
BEGIN XARELTO TONIGHT 07/29/2019 :-)   Moderate Conscious Sedation, Adult, Care After These instructions provide you with information about caring for yourself after your procedure. Your health care provider may also give you more specific instructions. Your treatment has been planned according to current medical practices, but problems sometimes occur. Call your health care provider if you have any problems or questions after your procedure. What can I expect after the procedure? After your procedure, it is common:  To feel sleepy for several hours.  To feel clumsy and have poor balance for several hours.  To have poor judgment for several hours.  To vomit if you eat too soon. Follow these instructions at home: For at least 24 hours after the procedure:   Do not: ? Participate in activities where you could fall or become injured. ? Drive. ? Use heavy machinery. ? Drink alcohol. ? Take sleeping pills or medicines that cause drowsiness. ? Make important decisions or sign legal documents. ? Take care of children on your own.  Rest. Eating and drinking  Follow the diet recommended by your health care provider.  If you vomit: ? Drink water, juice, or soup when you can drink without vomiting. ? Make sure you have little or no nausea before eating solid foods. General instructions  Have a responsible adult stay with you until you are awake and alert.  Take over-the-counter and prescription medicines only as told by your health care provider.  If you smoke, do not smoke without supervision.  Keep all follow-up visits as told by your health care provider. This is important. Contact a health care provider if:  You keep feeling nauseous or you keep vomiting.  You feel light-headed.  You develop a rash.  You have a fever. Get help right away if:  You have trouble breathing. This information is not intended to replace advice given to you by your health care provider. Make  sure you discuss any questions you have with your health care provider. Document Revised: 03/20/2017 Document Reviewed: 07/28/2015 Elsevier Patient Education  2020 [employer]  This sheet gives you information about how to care for yourself after your procedure. Your health care provider may also give you more specific instructions. If you have problems or questions, contact your health care provider. What can I expect after the procedure? After the procedure, it is common to have:  Bruising and tenderness at the catheter insertion area. Follow these instructions at home: Medicines  Take over-the-counter and prescription medicines only as told by your health care provider. Insertion site care  Follow instructions from your health care provider about how to take care of your insertion site. Make sure you: ? Wash your hands with soap and water before you change your bandage (dressing). If soap and water are not available, use hand sanitizer. ? Change your dressing as told by your health care provider. ? Leave stitches (sutures), skin glue, or adhesive strips in place. These skin closures may need to stay in place for 2 weeks or longer. If adhesive strip edges start to loosen and curl up, you may trim the loose edges. Do not remove adhesive strips completely unless your health care provider tells you to do that.  Check your insertion site every day for signs of infection. Check for: ? Redness, swelling, or pain. ? Fluid or blood. ? Pus or a bad smell. ? Warmth.  Do not take baths, swim, or use a hot tub until your health care provider  approves.  You may shower 24-48 hours after the procedure, or as directed by your health care provider. ? Remove the dressing and gently wash the site with plain soap and water. ? Pat the area dry with a clean towel. ? Do not rub the site. That could cause bleeding.  Do not apply powder or lotion to the site. Activity   For 24  hours after the procedure, or as directed by your health care provider: ? Do not flex or bend the affected arm. ? Do not push or pull heavy objects with the affected arm. ? Do not drive yourself home from the hospital or clinic. You may drive 24 hours after the procedure unless your health care provider tells you not to. ? Do not operate machinery or power tools.  Do not lift anything that is heavier than 10 lb (4.5 kg), or the limit that you are told, until your health care provider says that it is safe.  Ask your health care provider when it is okay to: ? Return to work or school. ? Resume usual physical activities or sports. ? Resume sexual activity. General instructions  If the catheter site starts to bleed, raise your arm and put firm pressure on the site. If the bleeding does not stop, get help right away. This is a medical emergency.  If you went home on the same day as your procedure, a responsible adult should be with you for the first 24 hours after you arrive home.  Keep all follow-up visits as told by your health care provider. This is important. Contact a health care provider if:  You have a fever.  You have redness, swelling, or yellow drainage around your insertion site. Get help right away if:  You have unusual pain at the radial site.  The catheter insertion area swells very fast.  The insertion area is bleeding, and the bleeding does not stop when you hold steady pressure on the area.  Your arm or hand becomes pale, cool, tingly, or numb. These symptoms may represent a serious problem that is an emergency. Do not wait to see if the symptoms will go away. Get medical help right away. Call your local emergency services (911 in the [place] Do not drive yourself to the hospital. Summary  After the procedure, it is common to have bruising and tenderness at the site.  Follow instructions from your health care provider about how to take care of your radial site wound.  Check the wound every day for signs of infection.  Do not lift anything that is heavier than 10 lb (4.5 kg), or the limit that you are told, until your health care provider says that it is safe. This information is not intended to replace advice given to you by your health care provider. Make sure you discuss any questions you have with your health care provider. Document Revised: 05/13/2017 Document Reviewed: 05/13/2017 Elsevier Patient Education  2020 [employer].

## 2019-08-01 ENCOUNTER — Ambulatory Visit: Payer: Medicare HMO | Admitting: Physician Assistant

## 2019-08-02 ENCOUNTER — Other Ambulatory Visit (INDEPENDENT_AMBULATORY_CARE_PROVIDER_SITE_OTHER): Payer: Medicare HMO

## 2019-08-02 DIAGNOSIS — I10 Essential (primary) hypertension: Secondary | ICD-10-CM | POA: Diagnosis not present

## 2019-08-03 DIAGNOSIS — H18413 Arcus senilis, bilateral: Secondary | ICD-10-CM | POA: Diagnosis not present

## 2019-08-03 DIAGNOSIS — H0288A Meibomian gland dysfunction right eye, upper and lower eyelids: Secondary | ICD-10-CM | POA: Diagnosis not present

## 2019-08-03 DIAGNOSIS — B079 Viral wart, unspecified: Secondary | ICD-10-CM | POA: Diagnosis not present

## 2019-08-03 DIAGNOSIS — H16223 Keratoconjunctivitis sicca, not specified as Sjogren's, bilateral: Secondary | ICD-10-CM | POA: Diagnosis not present

## 2019-08-03 DIAGNOSIS — H401121 Primary open-angle glaucoma, left eye, mild stage: Secondary | ICD-10-CM | POA: Diagnosis not present

## 2019-08-03 DIAGNOSIS — H11153 Pinguecula, bilateral: Secondary | ICD-10-CM | POA: Diagnosis not present

## 2019-08-03 DIAGNOSIS — H0288B Meibomian gland dysfunction left eye, upper and lower eyelids: Secondary | ICD-10-CM | POA: Diagnosis not present

## 2019-08-03 DIAGNOSIS — H47233 Glaucomatous optic atrophy, bilateral: Secondary | ICD-10-CM | POA: Diagnosis not present

## 2019-08-03 DIAGNOSIS — H401111 Primary open-angle glaucoma, right eye, mild stage: Secondary | ICD-10-CM | POA: Diagnosis not present

## 2019-08-15 DIAGNOSIS — N401 Enlarged prostate with lower urinary tract symptoms: Secondary | ICD-10-CM | POA: Diagnosis not present

## 2019-08-29 DIAGNOSIS — R1031 Right lower quadrant pain: Secondary | ICD-10-CM | POA: Diagnosis not present

## 2019-08-29 DIAGNOSIS — R972 Elevated prostate specific antigen [PSA]: Secondary | ICD-10-CM | POA: Diagnosis not present

## 2019-08-29 DIAGNOSIS — R3915 Urgency of urination: Secondary | ICD-10-CM | POA: Diagnosis not present

## 2019-08-29 DIAGNOSIS — I714 Abdominal aortic aneurysm, without rupture: Secondary | ICD-10-CM | POA: Diagnosis not present

## 2019-08-31 DIAGNOSIS — M6281 Muscle weakness (generalized): Secondary | ICD-10-CM | POA: Diagnosis not present

## 2019-08-31 DIAGNOSIS — R3915 Urgency of urination: Secondary | ICD-10-CM | POA: Diagnosis not present

## 2019-08-31 DIAGNOSIS — N50811 Right testicular pain: Secondary | ICD-10-CM | POA: Diagnosis not present

## 2019-08-31 DIAGNOSIS — R102 Pelvic and perineal pain: Secondary | ICD-10-CM | POA: Diagnosis not present

## 2019-08-31 DIAGNOSIS — M62838 Other muscle spasm: Secondary | ICD-10-CM | POA: Diagnosis not present

## 2019-08-31 DIAGNOSIS — R1031 Right lower quadrant pain: Secondary | ICD-10-CM | POA: Diagnosis not present

## 2019-09-13 ENCOUNTER — Telehealth: Payer: Self-pay | Admitting: Cardiology

## 2019-09-13 NOTE — Telephone Encounter (Signed)
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

## 2019-09-13 NOTE — Telephone Encounter (Signed)
Spoke to patient Dr.Jordan's advice given. 

## 2019-09-13 NOTE — Telephone Encounter (Signed)
Patient returning call. Transferred call to the nurse. 

## 2019-09-13 NOTE — Telephone Encounter (Signed)
Returned call to patient no answer.LMTC. 

## 2019-09-13 NOTE — Telephone Encounter (Signed)
Patient states that his Urologist found an abdominal aortic aneurysm. He was told to contact his PCP but his PCP told him to contact his Urologist. Patient states he is confused so his wife told him to reach out to Dr. Martinique because he would know what to do. Please advise.

## 2019-09-13 NOTE — Telephone Encounter (Signed)
AAA is small. Just recommend yearly follow up with either CT or ultrasound.  Ivania Teagarden Martinique MD, [facility]

## 2019-09-14 DIAGNOSIS — I714 Abdominal aortic aneurysm, without rupture: Secondary | ICD-10-CM | POA: Diagnosis not present

## 2019-09-14 DIAGNOSIS — N4281 Prostatodynia syndrome: Secondary | ICD-10-CM | POA: Diagnosis not present

## 2019-09-15 DIAGNOSIS — R972 Elevated prostate specific antigen [PSA]: Secondary | ICD-10-CM | POA: Diagnosis not present

## 2019-09-15 DIAGNOSIS — C61 Malignant neoplasm of prostate: Secondary | ICD-10-CM | POA: Diagnosis not present

## 2019-09-20 DIAGNOSIS — R3915 Urgency of urination: Secondary | ICD-10-CM | POA: Diagnosis not present

## 2019-09-20 DIAGNOSIS — M6281 Muscle weakness (generalized): Secondary | ICD-10-CM | POA: Diagnosis not present

## 2019-09-20 DIAGNOSIS — N50811 Right testicular pain: Secondary | ICD-10-CM | POA: Diagnosis not present

## 2019-09-20 DIAGNOSIS — R102 Pelvic and perineal pain: Secondary | ICD-10-CM | POA: Diagnosis not present

## 2019-09-20 DIAGNOSIS — M62838 Other muscle spasm: Secondary | ICD-10-CM | POA: Diagnosis not present

## 2019-09-21 ENCOUNTER — Other Ambulatory Visit: Payer: Self-pay | Admitting: Urology

## 2019-09-21 DIAGNOSIS — C61 Malignant neoplasm of prostate: Secondary | ICD-10-CM

## 2019-09-30 ENCOUNTER — Encounter (HOSPITAL_COMMUNITY)
Admission: RE | Admit: 2019-09-30 | Discharge: 2019-09-30 | Disposition: A | Discharge status: Home / Self Care | Payer: Medicare HMO | Source: Ambulatory Visit | Attending: Urology | Admitting: Urology

## 2019-09-30 ENCOUNTER — Other Ambulatory Visit: Payer: Self-pay

## 2019-09-30 DIAGNOSIS — C61 Malignant neoplasm of prostate: Secondary | ICD-10-CM | POA: Insufficient documentation

## 2019-09-30 MED ORDER — TECHNETIUM TC 99M MEDRONATE IV KIT
21.1000 | PACK | Freq: Once | INTRAVENOUS | Status: AC
  Administered 2019-09-30: 21.1 via INTRAVENOUS

## 2019-10-10 DIAGNOSIS — N50811 Right testicular pain: Secondary | ICD-10-CM | POA: Diagnosis not present

## 2019-10-10 DIAGNOSIS — R102 Pelvic and perineal pain: Secondary | ICD-10-CM | POA: Diagnosis not present

## 2019-10-10 DIAGNOSIS — N411 Chronic prostatitis: Secondary | ICD-10-CM | POA: Diagnosis not present

## 2019-10-10 DIAGNOSIS — R3915 Urgency of urination: Secondary | ICD-10-CM | POA: Diagnosis not present

## 2019-10-10 DIAGNOSIS — R1031 Right lower quadrant pain: Secondary | ICD-10-CM | POA: Diagnosis not present

## 2019-10-11 DIAGNOSIS — D485 Neoplasm of uncertain behavior of skin: Secondary | ICD-10-CM | POA: Diagnosis not present

## 2019-10-11 DIAGNOSIS — Z85828 Personal history of other malignant neoplasm of skin: Secondary | ICD-10-CM | POA: Diagnosis not present

## 2019-10-11 DIAGNOSIS — D0439 Carcinoma in situ of skin of other parts of face: Secondary | ICD-10-CM | POA: Diagnosis not present

## 2019-10-13 DIAGNOSIS — C61 Malignant neoplasm of prostate: Secondary | ICD-10-CM | POA: Diagnosis not present

## 2019-10-13 DIAGNOSIS — R351 Nocturia: Secondary | ICD-10-CM | POA: Diagnosis not present

## 2019-10-13 DIAGNOSIS — N403 Nodular prostate with lower urinary tract symptoms: Secondary | ICD-10-CM | POA: Diagnosis not present

## 2019-10-13 DIAGNOSIS — N5201 Erectile dysfunction due to arterial insufficiency: Secondary | ICD-10-CM | POA: Diagnosis not present

## 2019-10-13 DIAGNOSIS — R972 Elevated prostate specific antigen [PSA]: Secondary | ICD-10-CM | POA: Diagnosis not present

## 2019-10-14 NOTE — Progress Notes (Signed)
Cardiology Office Note   Date:  10/17/2019   ID:  KAJ VASIL, DOB 26-Jul-1940, MRN [id number]  PCP:  Glenis Smoker, MD  Cardiologist:   Jaymen Fetch [place], MD   Chief Complaint  Patient presents with  . Edema    bilateral swelling, mainly left foot      History of Present Illness: Caleb Barton is a 79 y.o. male who is seen for follow up Afib.  He presented to the Afib clinic in May with new onset AFib. This was found when he was scheduled for colonoscopy. He was asymptomatic. Rate controlled on no therapy- pulse rate 60. He was anticoagulated and underwent DCCV on 10/14/16. He states he remained in NSR for only one day. Echo was unremarkable. Decision was made to continue anticoagulation and leave him in AFib since he was asymptomatic. Holter monitor was done in October 2018 to evaluate bradycardia. It showed no significant symptomatic bradycardia or pauses.   Review of his old records shows he had a  cardiac cath in 2007 by Dr. Tamala Julian. This was done for an abnormal Myoview study. Cardiac cath was normal. Interestingly the patient has no recollection of having this done. He has a history of LAFB/RBBB, HTN, OSA on CPAP, and remote thalamic CVA in 2006. Also history of HLD. Colonoscopy was done on 12/17/16 with findings of one polyp.   He was seen in February with symptoms of dizziness and dyspnea. Echo showed normal LV function, moderate biatrial enlargement, aortic dilation. Myoview was done showing a large inferior defect. 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   On follow up today he continues to feel well. He does a lot of yard work.  Denies any chest pain, palpitations, SOB. He does note he has been diagnosed with prostate CA stage T2A. Still unsure which treatment he will go for.    Past Medical History:  Diagnosis Date  . 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 Dysrhythmia    RBBB  . Hypercholesteremia    takes Lipitor  daily and niacin daily  . Hypertension    takes HCTZ daily and losartan daily  . Right knee DJD   . Sleep apnea    cpap;sleep study done at home;request report from dr.gates  . Stroke [facility])    TIA 15 yrs ago    Past Surgical History:  Procedure Laterality Date  . CARDIOVERSION N/A 10/14/2016   Procedure: CARDIOVERSION;  Surgeon: Larey Dresser, MD;  Location: [facility] ENDOSCOPY;  Service: Cardiovascular;  Laterality: N/A;  . cataracts both eyes     ioc lens  . COLONOSCOPY     with polyps  . COLONOSCOPY  08/20/2011   Procedure: COLONOSCOPY;  Surgeon: Winfield Cunas., MD;  Location: [facility];  Service: Endoscopy;  Laterality: N/A;  . COLONOSCOPY WITH PROPOFOL N/A 12/17/2016   Procedure: COLONOSCOPY WITH PROPOFOL;  Surgeon: Laurence Spates, MD;  Location: [facility] ENDOSCOPY;  Service: Endoscopy;  Laterality: N/A;  . EYE SURGERY     laser bilateral eyes  . KNEE ARTHROSCOPY  x 2 right knee  . LEFT HEART CATH AND CORONARY ANGIOGRAPHY N/A 07/29/2019   Procedure: LEFT HEART CATH AND CORONARY ANGIOGRAPHY;  Surgeon: Wellington Hampshire, MD;  Location: [facility] CV LAB;  Service: Cardiovascular;  Laterality: N/A;  . LUMBAR LAMINECTOMY/DECOMPRESSION MICRODISCECTOMY Right 09/09/2012   Procedure: LUMBAR LAMINECTOMY/DECOMPRESSION MICRODISCECTOMY 1 LEVEL;  Surgeon: Eustace Moore, MD;  Location:  [facility] NEURO ORS;  Service: Neurosurgery;  Laterality: Right;  LUMBAR LAMINECTOMY/DECOMPRESSION MICRODISCECTOMY 1 LEVEL  . TONSILLECTOMY  yrs ago  . TOTAL KNEE ARTHROPLASTY  02/09/2012   Procedure: TOTAL KNEE ARTHROPLASTY;  Surgeon: Lorn Junes, MD;  Location: [facility];  Service: Orthopedics;  Laterality: Right;     Current Outpatient Medications  Medication Sig Dispense Refill  . acetaminophen (TYLENOL) 500 MG tablet Take 1,500 mg by mouth daily as needed for headache.     [name] amoxicillin (AMOXIL) 250 MG capsule Take 1,000 mg by mouth. 1 hour prior to dental appointments    . atorvastatin (LIPITOR) 40 MG tablet Take  40 mg by mouth daily.    . hydrochlorothiazide (HYDRODIURIL) 25 MG tablet Take 25 mg by mouth daily.    [name] latanoprost (XALATAN) 0.005 % ophthalmic solution Place 1 drop into both eyes at bedtime.     [name] losartan (COZAAR) 50 MG tablet Take 75 mg by mouth daily.     . Multiple Vitamin (MULTIVITAMIN WITH MINERALS) TABS tablet Take 1 tablet by mouth daily.    . Potassium 99 MG TABS Take 99 mg by mouth daily.    . RESTASIS 0.05 % ophthalmic emulsion Place 1 drop into both eyes 2 (two) times daily.     . tamsulosin (FLOMAX) 0.4 MG CAPS capsule Take 0.4 mg by mouth daily.    Alveda Reasons 20 MG TABS tablet TAKE ONE TABLET BY MOUTH EVERY EVENING WITH SUPPER (Patient taking differently: Take 20 mg by mouth daily with supper. ) 90 tablet 4  . dorzolamide-timolol (COSOPT) 22.3-6.8 MG/ML ophthalmic solution Place 1 drop into both eyes daily.    . imiquimod (ALDARA) 5 % cream Apply 1 application topically daily.     No current facility-administered medications for this visit.    Allergies:   Lisinopril, Other, and Sulfa antibiotics    Social History:  The patient  reports that he has quit smoking. He has never used smokeless tobacco. He reports current alcohol use. He reports that he does not use drugs.   Family History:  The patient's family history includes Stroke in his mother.    ROS:  Please see the history of present illness.   Otherwise, review of systems are positive for none.   All other systems are reviewed and negative.    PHYSICAL EXAM: VS:  BP 120/72 (BP Location: Left Arm, Patient Position: Sitting, Cuff Size: Large)   Pulse 85   Ht 5\' 9"  (1.753 m)   Wt 234 lb (106.1 kg)   BMI 34.56 kg/m  , BMI Body mass index is 34.56 kg/m. GENERAL:  Well appearing overweight WM HEENT:  PERRL, EOMI, sclera are clear. Oropharynx is clear. NECK:  No jugular venous distention, carotid upstroke brisk and symmetric, no bruits, no thyromegaly or adenopathy LUNGS:  Clear to auscultation bilaterally CHEST:   Unremarkable HEART:  IRRR,  PMI not displaced or sustained,S1 and S2 within normal limits, no S3, no S4: no clicks, no rubs, no murmurs ABD:  Soft, nontender. BS +, no masses or bruits. No hepatomegaly, no splenomegaly EXT:  2 + pulses throughout, no edema, no cyanosis no clubbing SKIN:  Warm and dry.  No rashes NEURO:  Alert and oriented x 3. Cranial nerves II through XII intact. PSYCH:  Cognitively intact    Recent Labs: 07/27/2019: BUN 11; Creatinine, Ser 0.85; Hemoglobin 16.4; Platelets 231; Potassium 4.2; Sodium 143    Lipid Panel No results found for: CHOL, TRIG, HDL, CHOLHDL, VLDL, LDLCALC, LDLDIRECT  Wt Readings from Last 3 Encounters:  10/17/19 234 lb (106.1 kg)  07/29/19 235 lb (106.6 kg)  07/27/19 235 lb (106.6 kg)    Dated 05/09/16: cholesterol 148, triglycerides 108, HDL 44, LDL 82.  Dated 05/21/17: cholesterol 121, triglycerides 81, HDL 34, LDL 71. Chemistry panel normal. Dated 06/10/18: cholesterol 120, triglycerides 138, HDL 37, LDL 56. Hgb and BMET normal Dated 06/15/19: cholesterol 131, triglycerides 93, HDL 43, LDL 71.   Ecg today shows AFib with rate 63. LAFB, RBBB. I have personally reviewed and interpreted this study.   Other studies Reviewed: Echo: 09/23/16: Study Conclusions  - Left ventricle: The cavity size was normal. Systolic function was   normal. The estimated ejection fraction was in the range of 55%   to 60%. Wall motion was normal; there were no regional wall   motion abnormalities. - Mitral valve: Calcified annulus. - Left atrium: The atrium was mildly dilated. - Atrial septum: No defect or patent foramen ovale was identified  Holter 02/12/17: Study Highlights    Atrial fibrillation/flutter with controlled ventricular rate. Mean HR 81 bpm  Slowest HR of 35 during sleeping hours.  Occasional PVCs and rare PVC couplets.     Echo 06/30/19: IMPRESSIONS    1. Left ventricular ejection fraction, by estimation, is 55 to 60%. The  left  ventricle has normal function. The left ventricle has no regional  wall motion abnormalities. There is indeterminate left ventricular  hypertrophy. Left ventricular diastolic  parameters are indeterminate.  2. Right ventricular systolic function is normal. The right ventricular  size is normal. Tricuspid regurgitation signal is inadequate for assessing  PA pressure.  3. Left atrial size was moderately dilated.  4. Right atrial size was moderately dilated.  5. The mitral valve is normal in structure. No evidence of mitral valve  regurgitation. No evidence of mitral stenosis.  6. The aortic valve was not well visualized. Aortic valve regurgitation  is not visualized. No aortic stenosis is present.  7. Aortic dilatation noted. 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  8. The inferior vena cava is normal in size with greater than 50%  respiratory variability, suggesting right atrial pressure of 3 mmHg.   Myoview 07/19/19: Study Highlights    The left ventricular ejection fraction is mildly decreased (45-54%).  Nuclear stress EF: 53%.  There was no ST segment deviation noted during stress.  Defect 1: There is a large defect of moderate severity present in the basal anteroseptal, basal inferoseptal, basal inferior, basal inferolateral, mid inferoseptal, mid inferior and apical inferior location.  This is an intermediate risk study.  No prior study for comparison.   EF low normal without specific wall motion abnormalities seen, but there is a large fixed defect in the inferior/inferoseptal walls from base to apex, extending to anteroseptal/inferolateral areas at the base. This does not change significantly with stress imaging. There is no clear diaphragmatic attenuation to explain findings. There are notes re: abnormal myoview with normal cath in 2007, but I cannot see this study to compare. Intermediate risk given size  of area, but no ischemia seen.  Cardiac cath 07/29/19:  LEFT HEART CATH AND CORONARY ANGIOGRAPHY  Conclusion    Mid RCA lesion is 30% stenosed.  Ost RCA lesion is 20% stenosed.  Ost LAD lesion is 20% stenosed.  Ramus lesion is 60% stenosed.   1.  Mild nonobstructive coronary artery disease.  Borderline stenosis in ostial ramus.  2.  Left ventricular angiography was not performed.  EF was normal by echo. 3.  Mildly elevated left ventricular end-diastolic pressure at 15 mmHg.  Recommendations: Suspect false positive nuclear stress test. Continue medical therapy.  ASSESSMENT AND PLAN:  1.  Atrial fibrillation persistent/permanent.  S/p DCCV but did not hold. Asymptomatic. Rate is controlled. Will continue with rate control and anticoagulation.   2. OSA on CPAP. 3. HTN controlled. 4. Remote CVA.  5. Hypercholesterolemia. Well controlled.  6. Dyspnea. Echo and cardiac cath unremarkable.    Disposition:   FU with me in one year.   Signed, Lorieann Argueta Martin[place], MD  10/17/2019 4:42 PM    [facility] [address] [place], [place], [address] Phone [phone], Fax [phone]

## 2019-10-17 ENCOUNTER — Other Ambulatory Visit: Payer: Self-pay

## 2019-10-17 ENCOUNTER — Ambulatory Visit: Payer: Medicare HMO | Admitting: Cardiology

## 2019-10-17 ENCOUNTER — Encounter: Payer: Self-pay | Admitting: Cardiology

## 2019-10-17 VITALS — BP 120/72 | HR 85 | Ht 69.0 in | Wt 234.0 lb

## 2019-10-17 DIAGNOSIS — I4819 Other persistent atrial fibrillation: Secondary | ICD-10-CM

## 2019-10-17 DIAGNOSIS — I452 Bifascicular block: Secondary | ICD-10-CM | POA: Diagnosis not present

## 2019-10-17 DIAGNOSIS — I10 Essential (primary) hypertension: Secondary | ICD-10-CM | POA: Diagnosis not present

## 2019-10-17 DIAGNOSIS — E7800 Pure hypercholesterolemia, unspecified: Secondary | ICD-10-CM

## 2019-10-19 DIAGNOSIS — G4733 Obstructive sleep apnea (adult) (pediatric): Secondary | ICD-10-CM | POA: Diagnosis not present

## 2019-11-01 ENCOUNTER — Encounter: Payer: Self-pay | Admitting: Radiation Oncology

## 2019-11-01 ENCOUNTER — Ambulatory Visit
Admission: RE | Admit: 2019-11-01 | Discharge: 2019-11-01 | Disposition: A | Discharge status: Home / Self Care | Payer: Medicare HMO | Source: Ambulatory Visit | Attending: Radiation Oncology | Admitting: Radiation Oncology

## 2019-11-01 ENCOUNTER — Encounter: Payer: Self-pay | Admitting: Medical Oncology

## 2019-11-01 ENCOUNTER — Other Ambulatory Visit: Payer: Self-pay

## 2019-11-01 VITALS — BP 119/75 | HR 62 | Temp 98.3°F | Resp 20 | Ht 69.0 in | Wt 232.4 lb

## 2019-11-01 DIAGNOSIS — C61 Malignant neoplasm of prostate: Secondary | ICD-10-CM | POA: Insufficient documentation

## 2019-11-01 DIAGNOSIS — H02834 Dermatochalasis of left upper eyelid: Secondary | ICD-10-CM | POA: Diagnosis not present

## 2019-11-01 DIAGNOSIS — H02831 Dermatochalasis of right upper eyelid: Secondary | ICD-10-CM | POA: Diagnosis not present

## 2019-11-01 DIAGNOSIS — N529 Male erectile dysfunction, unspecified: Secondary | ICD-10-CM | POA: Diagnosis not present

## 2019-11-01 DIAGNOSIS — Z808 Family history of malignant neoplasm of other organs or systems: Secondary | ICD-10-CM | POA: Diagnosis not present

## 2019-11-01 DIAGNOSIS — H16223 Keratoconjunctivitis sicca, not specified as Sjogren's, bilateral: Secondary | ICD-10-CM | POA: Diagnosis not present

## 2019-11-01 DIAGNOSIS — H401111 Primary open-angle glaucoma, right eye, mild stage: Secondary | ICD-10-CM | POA: Diagnosis not present

## 2019-11-01 DIAGNOSIS — Z923 Personal history of irradiation: Secondary | ICD-10-CM | POA: Diagnosis not present

## 2019-11-01 DIAGNOSIS — H401121 Primary open-angle glaucoma, left eye, mild stage: Secondary | ICD-10-CM | POA: Diagnosis not present

## 2019-11-01 DIAGNOSIS — H0288B Meibomian gland dysfunction left eye, upper and lower eyelids: Secondary | ICD-10-CM | POA: Diagnosis not present

## 2019-11-01 DIAGNOSIS — R972 Elevated prostate specific antigen [PSA]: Secondary | ICD-10-CM | POA: Diagnosis not present

## 2019-11-01 DIAGNOSIS — H47233 Glaucomatous optic atrophy, bilateral: Secondary | ICD-10-CM | POA: Diagnosis not present

## 2019-11-01 DIAGNOSIS — H0288A Meibomian gland dysfunction right eye, upper and lower eyelids: Secondary | ICD-10-CM | POA: Diagnosis not present

## 2019-11-01 DIAGNOSIS — Z961 Presence of intraocular lens: Secondary | ICD-10-CM | POA: Diagnosis not present

## 2019-11-01 HISTORY — DX: Malignant neoplasm of prostate: C61

## 2019-11-01 NOTE — Progress Notes (Signed)
GU Location of Tumor / Histology: prostatic adenocarcinoma  If Prostate Cancer, Gleason Score is (4 + 3) and PSA is (4.1). Prostate volume: 36.  [name] presented with a gradually rising PSA over the last several years.   Biopsies of prostate (if applicable) revealed:    Past/Anticipated interventions by urology, if any: prostate biopsy, prescribed tamsulosin, CT abd/pelvis (negative), bone scan (negative), referral to Dr. Tammi Klippel with a recommendation of short term ADT and EXBRT with boost  Past/Anticipated interventions by medical oncology, if any: no  Weight changes, if any: denies  Bowel/Bladder complaints, if any: IPSS 2. SHIM 1. Denies dysuria, hematuria, urinary leakage or incontinence. Denies any bowel complaints. Endorses taking tamsulosin.  Nausea/Vomiting, if any: no  Pain issues, if any:  Reports intermittent left knee pain related to need for replacement.  SAFETY ISSUES:  Prior radiation? denies  Pacemaker/ICD? Denies but does report afib related to right bundle branch block  Possible current pregnancy? no, male patient  Is the patient on methotrexate? denies  Current Complaints / other details:  79 year old male. Married with one son and daughter. Resides in [place].

## 2019-11-01 NOTE — Progress Notes (Signed)
Radiation Oncology         (336) [phone] ________________________________  Initial outpatient Consultation  Name: Caleb Barton MRN: [id number]  Date: 11/01/2019  DOB: 1940-05-17  FT:[id number], Caleb Barton Pall, MD  Irine Seal, MD   REFERRING PHYSICIAN: Irine Seal, MD  DIAGNOSIS: 79 y.o. 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    ICD-10-CM   1. Malignant neoplasm of prostate ([place])  C61     HISTORY OF PRESENT ILLNESS: Caleb Barton is a 79 y.o. male with a diagnosis of prostate cancer. He was noted to have increased LUTS and an elevated PSA of 2.27 in 2018 by his primary care physician, Dr. [name].  Accordingly, he was referred for evaluation in urology by Dr. Irine Seal on 06/12/2016,  digital rectal examination was performed at that time revealing a tender, indurated prostate suspicious for prostatitis.  The patient was treated with antibiotics and PSA normalized and LUTS resolved.  His PSA has remained stable and he had continued in routine follow-up with his PCP.  More recently, his PCP referred him back to Dr. Jeffie Pollock on 08/29/2019 due to testicular pain and an elevated PSA at 4.1 08/15/19.  PSA was 3.92 in February 2020 and increased to 5.16 in February 2021.  His PSA slightly decreased to 4.1 on follow-up PSA testing in April 2021 following a course of antibiotics for treatment of suspected prostatitis.  Because the PSA remained elevated, he was referred back to Dr. Jeffie Pollock for further evaluation and DRE at that time revealed some induration/nodularity.  The patient proceeded to transrectal ultrasound with 12 biopsies of the prostate on 09/15/2019.  The prostate volume measured 57 cc.  Out of 12 core biopsies, 6 were positive.  The maximum Gleason score was (4+3)=7, and this was seen in the right base, right base lateral, right mid and right apex lateral.  Additionally, Gleason 3+4 disease was seen in the right mid lateral and Gleason  3+3 in the left base lateral..  Pelvic CT on 08/30/2019 was without any evidence of visceral or osseous metastatic disease and a bone scan on 09/30/2019 was also without evidence of osseous metastatic disease.  The patient reviewed the biopsy results with his urologist and he has kindly been referred today for discussion of potential radiation treatment options.  His wife, Caleb Barton, is accompanying him for his visit today.   PREVIOUS RADIATION THERAPY: No  PAST MEDICAL HISTORY:  Past Medical History:  Diagnosis Date  . 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 Dysrhythmia    RBBB  . Hypercholesteremia    takes Lipitor daily and niacin daily  . Hypertension    takes HCTZ daily and losartan daily  . Prostate cancer ([place])   . Right knee DJD   . Sleep apnea    cpap;sleep study done at home;request report from dr.gates  . Stroke [facility])    TIA 15 yrs ago      PAST SURGICAL HISTORY: Past Surgical History:  Procedure Laterality Date  . CARDIOVERSION N/A 10/14/2016   Procedure: CARDIOVERSION;  Surgeon: Larey Dresser, MD;  Location: [facility] ENDOSCOPY;  Service: Cardiovascular;  Laterality: N/A;  . cataracts both eyes     ioc lens  . COLONOSCOPY     with polyps  . COLONOSCOPY  08/20/2011   Procedure: COLONOSCOPY;  Surgeon: Winfield Cunas., MD;  Location: [facility] ENDOSCOPY;  Service: Endoscopy;  Laterality: N/A;  .  COLONOSCOPY WITH PROPOFOL N/A 12/17/2016   Procedure: COLONOSCOPY WITH PROPOFOL;  Surgeon: Laurence Spates, MD;  Location: [facility] ENDOSCOPY;  Service: Endoscopy;  Laterality: N/A;  . EYE SURGERY     laser bilateral eyes  . KNEE ARTHROSCOPY  x 2 right knee  . LEFT HEART CATH AND CORONARY ANGIOGRAPHY N/A 07/29/2019   Procedure: LEFT HEART CATH AND CORONARY ANGIOGRAPHY;  Surgeon: Wellington Hampshire, MD;  Location: [place] CV LAB;  Service: Cardiovascular;  Laterality: N/A;  . LUMBAR LAMINECTOMY/DECOMPRESSION MICRODISCECTOMY Right 09/09/2012   Procedure: LUMBAR  LAMINECTOMY/DECOMPRESSION MICRODISCECTOMY 1 LEVEL;  Surgeon: Eustace Moore, MD;  Location: [place] NEURO ORS;  Service: Neurosurgery;  Laterality: Right;  LUMBAR LAMINECTOMY/DECOMPRESSION MICRODISCECTOMY 1 LEVEL  . TONSILLECTOMY  yrs ago  . TOTAL KNEE ARTHROPLASTY  02/09/2012   Procedure: TOTAL KNEE ARTHROPLASTY;  Surgeon: Lorn Junes, MD;  Location: [place];  Service: Orthopedics;  Laterality: Right;    FAMILY HISTORY:  Family History  Problem Relation Age of Onset  . Stroke Mother   . Breast cancer Mother   . Bladder Cancer Brother   . Breast cancer Other   . Prostate cancer Neg Hx   . Pancreatic cancer Neg Hx     SOCIAL HISTORY:  Social History   Socioeconomic History  . Marital status: Married    Spouse name: Caleb Barton  . Number of children: 2  . Years of education: Not on file  . Highest education level: Not on file  Occupational History    Comment: retired  Tobacco Use  . Smoking status: Former Research scientist (life sciences)  . Smokeless tobacco: Never Used  . Tobacco comment: quit 35 yrs ago  Vaping Use  . Vaping Use: Never used  Substance and Sexual Activity  . Alcohol use: Yes    Comment: social  . Drug use: No  . Sexual activity: Yes  Other Topics Concern  . Not on file  Social History Narrative  . Not on file   Social Determinants of Health   Financial Resource Strain:   . Difficulty of Paying Living Expenses:   Food Insecurity:   . Worried About Charity fundraiser in the Last Year:   . Arboriculturist in the Last Year:   Transportation Needs:   . Film/video editor (Medical):   [name] Lack of Transportation (Non-Medical):   Physical Activity:   . Days of Exercise per Week:   . Minutes of Exercise per Session:   Stress:   . Feeling of Stress :   Social Connections:   . Frequency of Communication with Friends and Family:   . Frequency of Social Gatherings with Friends and Family:   . Attends Religious Services:   . Active Member of Clubs or Organizations:   . Attends  Archivist Meetings:   [name] Marital Status:   Intimate Partner Violence:   . Fear of Current or Ex-Partner:   . Emotionally Abused:   [name] Physically Abused:   . Sexually Abused:     ALLERGIES: Lisinopril, Other, and Sulfa antibiotics  MEDICATIONS:  Current Outpatient Medications  Medication Sig Dispense Refill  . atorvastatin (LIPITOR) 40 MG tablet Take 40 mg by mouth daily.    . dorzolamide-timolol (COSOPT) 22.3-6.8 MG/ML ophthalmic solution Place 1 drop into both eyes 2 (two) times daily.     . hydrochlorothiazide (HYDRODIURIL) 25 MG tablet Take 25 mg by mouth daily.    . imiquimod (ALDARA) 5 % cream Apply 1 application topically daily.    [name]  latanoprost (XALATAN) 0.005 % ophthalmic solution Place 1 drop into both eyes at bedtime.     [name] losartan (COZAAR) 50 MG tablet Take 75 mg by mouth daily.     . Multiple Vitamin (MULTIVITAMIN WITH MINERALS) TABS tablet Take 1 tablet by mouth daily.    . Potassium 99 MG TABS Take 99 mg by mouth daily.    . RESTASIS 0.05 % ophthalmic emulsion Place 1 drop into both eyes 2 (two) times daily.     . tamsulosin (FLOMAX) 0.4 MG CAPS capsule Take 0.4 mg by mouth daily.    Alveda Reasons 20 MG TABS tablet TAKE ONE TABLET BY MOUTH EVERY EVENING WITH SUPPER (Patient taking differently: Take 20 mg by mouth daily with supper. ) 90 tablet 4  . acetaminophen (TYLENOL) 500 MG tablet Take 1,500 mg by mouth daily as needed for headache.  (Patient not taking: Reported on 11/01/2019)    . amoxicillin (AMOXIL) 250 MG capsule Take 1,000 mg by mouth. 1 hour prior to dental appointments (Patient not taking: Reported on 11/01/2019)     No current facility-administered medications for this encounter.    REVIEW OF SYSTEMS:  On review of systems, the patient reports that he is doing well overall. He denies any chest pain, shortness of breath, cough, fevers, chills, night sweats, or unintended weight changes. He denies any bowel disturbances, and denies abdominal pain, nausea  or vomiting. He denies any new musculoskeletal or joint aches or pains. His IPSS was 2, indicating mild urinary symptoms. 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 A complete review of systems is obtained and is otherwise negative.    PHYSICAL EXAM:  Wt Readings from Last 3 Encounters:  11/01/19 232 lb 6.4 oz (105.4 kg)  10/17/19 234 lb (106.1 kg)  07/29/19 235 lb (106.6 kg)   Temp Readings from Last 3 Encounters:  11/01/19 98.3 F (36.8 C)  07/29/19 97.8 F (36.6 C) (Skin)  01/19/19 97.9 F (36.6 C)   BP Readings from Last 3 Encounters:  11/01/19 119/75  10/17/19 120/72  07/29/19 (!) 138/117   Pulse Readings from Last 3 Encounters:  11/01/19 62  10/17/19 85  07/29/19 65   Pain Assessment Pain Score: 6  Pain Frequency: Intermittent (radiates up to hip) Pain Loc: Knee (left)/10  In general this is a well appearing Caucasian male in no acute distress. He is alert and oriented x4 and appropriate throughout the examination. HEENT reveals that the patient is normocephalic, atraumatic. EOMs are intact. PERRLA. Skin is intact without any evidence of gross lesions. Cardiovascular exam reveals a regular rate and rhythm, no clicks rubs or murmurs are auscultated. Chest is clear to auscultation bilaterally. Lymphatic assessment is performed and does not reveal any adenopathy in the cervical, supraclavicular, axillary, or inguinal chains. Abdomen has active bowel sounds in all quadrants and is intact. The abdomen is soft, non tender, non distended. Lower extremities are negative for pretibial pitting edema, deep calf tenderness, cyanosis or clubbing.   KPS = 100  100 - Normal; no complaints; no evidence of disease. 90   - Able to carry on normal activity; minor signs or symptoms of disease. 80   - Normal activity with effort; some signs or symptoms of disease. 2   - Cares for self; unable to carry on normal activity or to do active work. 60   - Requires occasional  assistance, but is able to care for most of his personal needs. 50   - Requires considerable  assistance and frequent medical care. 37   - Disabled; requires special care and assistance. 9   - Severely disabled; hospital admission is indicated although death not imminent. 82   - Very sick; hospital admission necessary; active supportive treatment necessary. 10   - Moribund; fatal processes progressing rapidly. 0     - Dead  Karnofsky DA, Abelmann [place], Craver LS and [facility] [phone]) The use of the nitrogen mustards in the palliative treatment of carcinoma: with particular reference to bronchogenic carcinoma Cancer 1 634-56  LABORATORY DATA:  Lab Results  Component Value Date   WBC 6.2 07/27/2019   HGB 16.4 07/27/2019   HCT 47.6 07/27/2019   MCV 94 07/27/2019   PLT 231 07/27/2019   Lab Results  Component Value Date   NA 143 07/27/2019   K 4.2 07/27/2019   CL 101 07/27/2019   CO2 28 07/27/2019   Lab Results  Component Value Date   ALT 39 02/04/2012   AST 29 02/04/2012   ALKPHOS 79 02/04/2012   BILITOT 0.8 02/04/2012     RADIOGRAPHY: No results found.    IMPRESSION/PLAN: 1. 79 y.o. gentleman with Stage T2a adenocarcinoma of the prostate with Gleason Score of 4+3, and PSA of 4.1.  We discussed the patient's workup and outlined the nature of prostate cancer in this setting. The patient's T stage, Gleason's score, and PSA put him into the unfavorable intermediate risk group. Accordingly, he is eligible for a variety of potential treatment options including prostatectomy or ST-ADT in combination with either brachytherapy or a 5.5 week course of external radiation. We discussed the available radiation techniques, and focused on the details and logistics of delivery. We discussed and outlined the risks, benefits, short and long-term effects associated with radiotherapy and compared and contrasted these with prostatectomy. We discussed the role of SpaceOAR in reducing the rectal  toxicity associated with radiotherapy. We also detailed the role of ST-ADT in the treatment of unfavorable intermediate risk prostate cancer and outlined the associated side effects that could be expected with this therapy.  At the conclusion of our conversation, the patient is interested in moving forward with ST-ADT concurrent with a 5-1/2-week course of daily radiotherapy.  He and his wife appear to have a good understanding of his disease and our treatment recommendations which are of curative intent.  He has not yet started ADT so we will share our discussion with Dr. Jeffie Pollock and help to coordinate a follow-up visit in urology, first available, to start ADT now.  Once we have a date for starting ADT, we will also coordinate for fiducial markers and SpaceOAR gel placement with Dr. Jeffie Pollock, prior to CT simulation/treatment planning, sometime in September 2021, in anticipation of beginning his daily radiation treatments shortly thereafter.  The patient and his wife are comfortable and in agreement with the stated plan    Nicholos Johns, PA-C    Tyler Pita, MD  [place]: [phone]  Fax: [phone] [email or web address]  Skype  [email or web address]   This document serves as a record of services personally performed by Tyler Pita, MD. It was created on his behalf by Jacqualyn Posey, a trained medical scribe. The creation of this record is based on the scribe's personal observations and the provider's statements to them. This document has been checked and approved by the attending provider.

## 2019-11-07 ENCOUNTER — Telehealth: Payer: Self-pay | Admitting: *Deleted

## 2019-11-07 NOTE — Telephone Encounter (Signed)
CALLED PATIENT TO INFORM OF ADT APPT. FOR 11-14-19 - ARRIVAL TIME- 2:15 PM @ DR. WRENN'S OFFICE, SPOKE WITH MR. Caleb Barton AND HE IS AWARE OF THIS APPT.

## 2019-11-14 DIAGNOSIS — C61 Malignant neoplasm of prostate: Secondary | ICD-10-CM | POA: Diagnosis not present

## 2019-11-15 DIAGNOSIS — Z85828 Personal history of other malignant neoplasm of skin: Secondary | ICD-10-CM | POA: Diagnosis not present

## 2019-11-15 DIAGNOSIS — D0439 Carcinoma in situ of skin of other parts of face: Secondary | ICD-10-CM | POA: Diagnosis not present

## 2019-11-16 DIAGNOSIS — C61 Malignant neoplasm of prostate: Secondary | ICD-10-CM | POA: Diagnosis not present

## 2019-11-16 DIAGNOSIS — Z5111 Encounter for antineoplastic chemotherapy: Secondary | ICD-10-CM | POA: Diagnosis not present

## 2019-11-22 ENCOUNTER — Other Ambulatory Visit: Payer: Self-pay | Admitting: Urology

## 2019-11-22 ENCOUNTER — Encounter: Payer: Self-pay | Admitting: Urology

## 2019-11-22 DIAGNOSIS — C61 Malignant neoplasm of prostate: Secondary | ICD-10-CM

## 2019-11-22 NOTE — Progress Notes (Signed)
Patient got Caleb Barton 240mg  injection 11/16/19 and Dr. Jeffie Barton plans to keep him on monthly Firmagon x 6 months due to his increased CVD risks.  He is scheduled for fiducial markers and SpaceOAR gel on 12/27/19 and CT SIM here on 01/06/20.  Nicholos Johns, MMS, PA-C [place] at [place]: [phone]  Fax: [phone]

## 2019-11-28 ENCOUNTER — Telehealth: Payer: Self-pay

## 2019-11-28 NOTE — Telephone Encounter (Signed)
   [facility] Pre-operative Risk Assessment    Request for surgical clearance:  1. What type of surgery is being performed? Space Oar  2. When is this surgery scheduled? 12/27/19  3. What type of clearance is required (medical clearance vs. Pharmacy clearance to hold med vs. Both)? Both  4. Are there any medications that need to be held prior to surgery and how long? Referring office is requesting Xarelto to be held 5 days prior to procedure  5. Practice name and name of physician performing surgery? [facility] - Dr. Irine Seal   6. What is the office phone number? [phone]   7.   What is the office fax number? [phone]  8.   Anesthesia type (None, local, MAC, general) ? Not listed   Caleb Barton 11/28/2019, 12:59 PM  _________________________________________________________________   (provider comments below)

## 2019-11-28 NOTE — Telephone Encounter (Signed)
Patient with diagnosis of afib on Xarelto for anticoagulation.    Procedure: Space Oar Date of procedure: 12/27/2019  CHADS2-VASc score of  6 (HTN, AGE, stroke/tia x 2, CAD, AGE)  CrCl 87 ml/min  Requesting MD is asking for a 5 day hold which seem a little excessive. Patient is high risk off anticoagulation due to hx of stroke. I will defer to Dr. Martinique for length of hold.

## 2019-11-28 NOTE — Telephone Encounter (Signed)
Agree with pharmacy. Xarelto only needs to be held for 48 hours prior to surgery not 5 days  Kierstyn Baranowski Martinique MD, [facility]

## 2019-11-28 NOTE — Telephone Encounter (Signed)
° °  Primary Cardiologist: Peter Martinique, MD  Chart reviewed as part of pre-operative protocol coverage. Given past medical history and time since last visit, based on ACC/AHA guidelines, Caleb Barton would be at acceptable risk for the planned procedure without further cardiovascular testing.   Patient with diagnosis of afib on Xarelto for anticoagulation.    Procedure: Space Oar Date of procedure: 12/27/2019  CHADS2-VASc score of  6 (HTN, AGE, stroke/tia x 2, CAD, AGE)  CrCl 87 ml/min  Patient is high risk off anticoagulation due to hx of stroke.  His Xarelto may be held for 48 hours prior to surgery.  Please resume as soon as hemostasis is achieved.  I will route this recommendation to the requesting party via Epic fax function and remove from pre-op pool.  Please call with questions.  Jossie Ng. Kwana Ringel NP-C    11/28/2019, 3:06 PM [place] [address] Office [phone] Fax [phone]

## 2019-12-01 DIAGNOSIS — R69 Illness, unspecified: Secondary | ICD-10-CM | POA: Diagnosis not present

## 2019-12-03 ENCOUNTER — Encounter: Payer: Self-pay | Admitting: Medical Oncology

## 2019-12-06 ENCOUNTER — Encounter: Payer: Self-pay | Admitting: Medical Oncology

## 2019-12-16 DIAGNOSIS — G4733 Obstructive sleep apnea (adult) (pediatric): Secondary | ICD-10-CM | POA: Diagnosis not present

## 2019-12-16 DIAGNOSIS — E785 Hyperlipidemia, unspecified: Secondary | ICD-10-CM | POA: Diagnosis not present

## 2019-12-16 DIAGNOSIS — I482 Chronic atrial fibrillation, unspecified: Secondary | ICD-10-CM | POA: Diagnosis not present

## 2019-12-16 DIAGNOSIS — J3089 Other allergic rhinitis: Secondary | ICD-10-CM | POA: Diagnosis not present

## 2019-12-16 DIAGNOSIS — C61 Malignant neoplasm of prostate: Secondary | ICD-10-CM | POA: Diagnosis not present

## 2019-12-16 DIAGNOSIS — I10 Essential (primary) hypertension: Secondary | ICD-10-CM | POA: Diagnosis not present

## 2019-12-27 DIAGNOSIS — C61 Malignant neoplasm of prostate: Secondary | ICD-10-CM | POA: Diagnosis not present

## 2019-12-28 ENCOUNTER — Telehealth: Payer: Self-pay | Admitting: *Deleted

## 2019-12-28 NOTE — Telephone Encounter (Signed)
CALLED PATIENT TO INFORM OF MRI FOR 01-06-20- ARRIVAL TIME- 12:30 PM @ [facility] SPOKE WITH PATIENT AND HE IS AWARE OF THIS MRI

## 2019-12-29 DIAGNOSIS — G4733 Obstructive sleep apnea (adult) (pediatric): Secondary | ICD-10-CM | POA: Diagnosis not present

## 2019-12-29 DIAGNOSIS — C61 Malignant neoplasm of prostate: Secondary | ICD-10-CM | POA: Diagnosis not present

## 2019-12-29 DIAGNOSIS — H409 Unspecified glaucoma: Secondary | ICD-10-CM | POA: Diagnosis not present

## 2019-12-29 DIAGNOSIS — D6869 Other thrombophilia: Secondary | ICD-10-CM | POA: Diagnosis not present

## 2019-12-29 DIAGNOSIS — J309 Allergic rhinitis, unspecified: Secondary | ICD-10-CM | POA: Diagnosis not present

## 2019-12-29 DIAGNOSIS — E669 Obesity, unspecified: Secondary | ICD-10-CM | POA: Diagnosis not present

## 2019-12-29 DIAGNOSIS — K5900 Constipation, unspecified: Secondary | ICD-10-CM | POA: Diagnosis not present

## 2019-12-29 DIAGNOSIS — E785 Hyperlipidemia, unspecified: Secondary | ICD-10-CM | POA: Diagnosis not present

## 2019-12-29 DIAGNOSIS — I4891 Unspecified atrial fibrillation: Secondary | ICD-10-CM | POA: Diagnosis not present

## 2019-12-29 DIAGNOSIS — I10 Essential (primary) hypertension: Secondary | ICD-10-CM | POA: Diagnosis not present

## 2020-01-03 DIAGNOSIS — Z23 Encounter for immunization: Secondary | ICD-10-CM | POA: Diagnosis not present

## 2020-01-06 ENCOUNTER — Ambulatory Visit (HOSPITAL_COMMUNITY)
Admission: RE | Admit: 2020-01-06 | Discharge: 2020-01-06 | Disposition: A | Discharge status: Home / Self Care | Payer: Medicare HMO | Source: Ambulatory Visit | Attending: Urology | Admitting: Urology

## 2020-01-06 ENCOUNTER — Ambulatory Visit
Admission: RE | Admit: 2020-01-06 | Discharge: 2020-01-06 | Disposition: A | Discharge status: Home / Self Care | Payer: Medicare HMO | Source: Ambulatory Visit | Attending: Radiation Oncology | Admitting: Radiation Oncology

## 2020-01-06 ENCOUNTER — Encounter: Payer: Self-pay | Admitting: Medical Oncology

## 2020-01-06 ENCOUNTER — Other Ambulatory Visit: Payer: Self-pay

## 2020-01-06 DIAGNOSIS — C61 Malignant neoplasm of prostate: Secondary | ICD-10-CM | POA: Diagnosis not present

## 2020-01-06 DIAGNOSIS — Z510 Encounter for antineoplastic radiation therapy: Secondary | ICD-10-CM | POA: Insufficient documentation

## 2020-01-15 NOTE — Progress Notes (Signed)
  Radiation Oncology         (336) [phone] ________________________________  Name: Caleb Barton MRN: [id number]  Date: 01/06/2020  DOB: 11-Aug-1940  SIMULATION AND TREATMENT PLANNING NOTE    ICD-10-CM   1. Malignant neoplasm of prostate ([place])  C61     DIAGNOSIS:  79 y.o. 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  NARRATIVE:  The patient was brought to the [facility].  Identity was confirmed.  All relevant records and images related to the planned course of therapy were reviewed.  The patient freely provided informed written consent to proceed with treatment after reviewing the details related to the planned course of therapy. The consent form was witnessed and verified by the simulation staff.  Then, the patient was set-up in a stable reproducible supine position for radiation therapy.  A vacuum lock pillow device was custom fabricated to position his legs in a reproducible immobilized position.  Then, I performed a urethrogram under sterile conditions to identify the prostatic apex.  CT images were obtained.  Surface markings were placed.  The CT images were loaded into the planning software.  Then the prostate target and avoidance structures including the rectum, bladder, bowel and hips were contoured.  Treatment planning then occurred.  The radiation prescription was entered and confirmed.  A total of one complex treatment devices was fabricated. I have requested : Intensity Modulated Radiotherapy (IMRT) is medically necessary for this case for the following reason:  Rectal sparing.[name]  PLAN:  The patient will receive 70 Gy in 28 fractions.  ________________________________  Sheral Apley Tammi Klippel, M.D.

## 2020-01-16 ENCOUNTER — Ambulatory Visit: Payer: Medicare HMO

## 2020-01-16 ENCOUNTER — Encounter: Payer: Self-pay | Admitting: Medical Oncology

## 2020-01-16 ENCOUNTER — Telehealth: Payer: Self-pay | Admitting: Radiation Oncology

## 2020-01-16 DIAGNOSIS — C61 Malignant neoplasm of prostate: Secondary | ICD-10-CM | POA: Diagnosis not present

## 2020-01-16 DIAGNOSIS — Z510 Encounter for antineoplastic radiation therapy: Secondary | ICD-10-CM | POA: Diagnosis not present

## 2020-01-16 NOTE — Telephone Encounter (Signed)
Received voicemail message from patient requesting a return call. Phoned to inquire.   Patient questions if he should get tested for COVID 19 before his initial radiation treatment tomorrow. Patient reports he frequently sneezes. Also, patient reports he had a headache this morning but it resolved without intervention. Patient denies other symptoms associated with COVID.   Patient confirms he has received both COVID vaccinations plus a booster. Patient denies being around anyone with confirmed COVID diagnosis.   Patient states, "I don't feel like I need to be tested but I wanted to be sure." Explained this RN doesn't see a need to test but should more symptoms develop he should reconsider. Patient verbalized understanding.

## 2020-01-17 ENCOUNTER — Other Ambulatory Visit: Payer: Self-pay

## 2020-01-17 ENCOUNTER — Ambulatory Visit
Admission: RE | Admit: 2020-01-17 | Discharge: 2020-01-17 | Disposition: A | Discharge status: Home / Self Care | Payer: Medicare HMO | Source: Ambulatory Visit | Attending: Radiation Oncology | Admitting: Radiation Oncology

## 2020-01-17 DIAGNOSIS — Z510 Encounter for antineoplastic radiation therapy: Secondary | ICD-10-CM | POA: Diagnosis not present

## 2020-01-17 DIAGNOSIS — C61 Malignant neoplasm of prostate: Secondary | ICD-10-CM | POA: Diagnosis not present

## 2020-01-18 ENCOUNTER — Ambulatory Visit
Admission: RE | Admit: 2020-01-18 | Discharge: 2020-01-18 | Disposition: A | Discharge status: Home / Self Care | Payer: Medicare HMO | Source: Ambulatory Visit | Attending: Radiation Oncology | Admitting: Radiation Oncology

## 2020-01-18 ENCOUNTER — Other Ambulatory Visit: Payer: Self-pay

## 2020-01-18 DIAGNOSIS — L821 Other seborrheic keratosis: Secondary | ICD-10-CM | POA: Diagnosis not present

## 2020-01-18 DIAGNOSIS — Z85828 Personal history of other malignant neoplasm of skin: Secondary | ICD-10-CM | POA: Diagnosis not present

## 2020-01-18 DIAGNOSIS — L308 Other specified dermatitis: Secondary | ICD-10-CM | POA: Diagnosis not present

## 2020-01-18 DIAGNOSIS — C61 Malignant neoplasm of prostate: Secondary | ICD-10-CM | POA: Diagnosis not present

## 2020-01-18 DIAGNOSIS — L820 Inflamed seborrheic keratosis: Secondary | ICD-10-CM | POA: Diagnosis not present

## 2020-01-18 DIAGNOSIS — L812 Freckles: Secondary | ICD-10-CM | POA: Diagnosis not present

## 2020-01-18 DIAGNOSIS — L570 Actinic keratosis: Secondary | ICD-10-CM | POA: Diagnosis not present

## 2020-01-18 DIAGNOSIS — D1801 Hemangioma of skin and subcutaneous tissue: Secondary | ICD-10-CM | POA: Diagnosis not present

## 2020-01-18 DIAGNOSIS — D485 Neoplasm of uncertain behavior of skin: Secondary | ICD-10-CM | POA: Diagnosis not present

## 2020-01-18 DIAGNOSIS — Z510 Encounter for antineoplastic radiation therapy: Secondary | ICD-10-CM | POA: Diagnosis not present

## 2020-01-19 ENCOUNTER — Ambulatory Visit
Admission: RE | Admit: 2020-01-19 | Discharge: 2020-01-19 | Disposition: A | Discharge status: Home / Self Care | Payer: Medicare HMO | Source: Ambulatory Visit | Attending: Radiation Oncology | Admitting: Radiation Oncology

## 2020-01-19 DIAGNOSIS — Z510 Encounter for antineoplastic radiation therapy: Secondary | ICD-10-CM | POA: Diagnosis not present

## 2020-01-19 DIAGNOSIS — C61 Malignant neoplasm of prostate: Secondary | ICD-10-CM | POA: Diagnosis not present

## 2020-01-20 ENCOUNTER — Ambulatory Visit
Admission: RE | Admit: 2020-01-20 | Discharge: 2020-01-20 | Disposition: A | Discharge status: Home / Self Care | Payer: Medicare HMO | Source: Ambulatory Visit | Attending: Radiation Oncology | Admitting: Radiation Oncology

## 2020-01-20 ENCOUNTER — Other Ambulatory Visit: Payer: Self-pay

## 2020-01-20 DIAGNOSIS — Z510 Encounter for antineoplastic radiation therapy: Secondary | ICD-10-CM | POA: Diagnosis not present

## 2020-01-20 DIAGNOSIS — C61 Malignant neoplasm of prostate: Secondary | ICD-10-CM | POA: Insufficient documentation

## 2020-01-23 ENCOUNTER — Ambulatory Visit
Admission: RE | Admit: 2020-01-23 | Discharge: 2020-01-23 | Disposition: A | Discharge status: Home / Self Care | Payer: Medicare HMO | Source: Ambulatory Visit | Attending: Radiation Oncology | Admitting: Radiation Oncology

## 2020-01-23 DIAGNOSIS — Z510 Encounter for antineoplastic radiation therapy: Secondary | ICD-10-CM | POA: Diagnosis not present

## 2020-01-23 DIAGNOSIS — C61 Malignant neoplasm of prostate: Secondary | ICD-10-CM | POA: Diagnosis not present

## 2020-01-24 ENCOUNTER — Ambulatory Visit
Admission: RE | Admit: 2020-01-24 | Discharge: 2020-01-24 | Disposition: A | Discharge status: Home / Self Care | Payer: Medicare HMO | Source: Ambulatory Visit | Attending: Radiation Oncology | Admitting: Radiation Oncology

## 2020-01-24 DIAGNOSIS — Z510 Encounter for antineoplastic radiation therapy: Secondary | ICD-10-CM | POA: Diagnosis not present

## 2020-01-24 DIAGNOSIS — C61 Malignant neoplasm of prostate: Secondary | ICD-10-CM | POA: Diagnosis not present

## 2020-01-25 ENCOUNTER — Ambulatory Visit
Admission: RE | Admit: 2020-01-25 | Discharge: 2020-01-25 | Disposition: A | Discharge status: Home / Self Care | Payer: Medicare HMO | Source: Ambulatory Visit | Attending: Radiation Oncology | Admitting: Radiation Oncology

## 2020-01-25 DIAGNOSIS — C61 Malignant neoplasm of prostate: Secondary | ICD-10-CM | POA: Diagnosis not present

## 2020-01-25 DIAGNOSIS — Z510 Encounter for antineoplastic radiation therapy: Secondary | ICD-10-CM | POA: Diagnosis not present

## 2020-01-26 ENCOUNTER — Ambulatory Visit
Admission: RE | Admit: 2020-01-26 | Discharge: 2020-01-26 | Disposition: A | Discharge status: Home / Self Care | Payer: Medicare HMO | Source: Ambulatory Visit | Attending: Radiation Oncology | Admitting: Radiation Oncology

## 2020-01-26 DIAGNOSIS — C61 Malignant neoplasm of prostate: Secondary | ICD-10-CM | POA: Diagnosis not present

## 2020-01-26 DIAGNOSIS — Z510 Encounter for antineoplastic radiation therapy: Secondary | ICD-10-CM | POA: Diagnosis not present

## 2020-01-27 ENCOUNTER — Ambulatory Visit
Admission: RE | Admit: 2020-01-27 | Discharge: 2020-01-27 | Disposition: A | Discharge status: Home / Self Care | Payer: Medicare HMO | Source: Ambulatory Visit | Attending: Radiation Oncology | Admitting: Radiation Oncology

## 2020-01-27 DIAGNOSIS — Z510 Encounter for antineoplastic radiation therapy: Secondary | ICD-10-CM | POA: Diagnosis not present

## 2020-01-27 DIAGNOSIS — C61 Malignant neoplasm of prostate: Secondary | ICD-10-CM | POA: Diagnosis not present

## 2020-01-30 ENCOUNTER — Ambulatory Visit
Admission: RE | Admit: 2020-01-30 | Discharge: 2020-01-30 | Disposition: A | Discharge status: Home / Self Care | Payer: Medicare HMO | Source: Ambulatory Visit | Attending: Radiation Oncology | Admitting: Radiation Oncology

## 2020-01-30 DIAGNOSIS — Z510 Encounter for antineoplastic radiation therapy: Secondary | ICD-10-CM | POA: Diagnosis not present

## 2020-01-30 DIAGNOSIS — C61 Malignant neoplasm of prostate: Secondary | ICD-10-CM | POA: Diagnosis not present

## 2020-01-31 ENCOUNTER — Ambulatory Visit
Admission: RE | Admit: 2020-01-31 | Discharge: 2020-01-31 | Disposition: A | Discharge status: Home / Self Care | Payer: Medicare HMO | Source: Ambulatory Visit | Attending: Radiation Oncology | Admitting: Radiation Oncology

## 2020-01-31 DIAGNOSIS — Z510 Encounter for antineoplastic radiation therapy: Secondary | ICD-10-CM | POA: Diagnosis not present

## 2020-01-31 DIAGNOSIS — C61 Malignant neoplasm of prostate: Secondary | ICD-10-CM | POA: Diagnosis not present

## 2020-01-31 DIAGNOSIS — Z5111 Encounter for antineoplastic chemotherapy: Secondary | ICD-10-CM | POA: Diagnosis not present

## 2020-02-01 ENCOUNTER — Ambulatory Visit
Admission: RE | Admit: 2020-02-01 | Discharge: 2020-02-01 | Disposition: A | Discharge status: Home / Self Care | Payer: Medicare HMO | Source: Ambulatory Visit | Attending: Radiation Oncology | Admitting: Radiation Oncology

## 2020-02-01 DIAGNOSIS — Z510 Encounter for antineoplastic radiation therapy: Secondary | ICD-10-CM | POA: Diagnosis not present

## 2020-02-01 DIAGNOSIS — C61 Malignant neoplasm of prostate: Secondary | ICD-10-CM | POA: Diagnosis not present

## 2020-02-02 ENCOUNTER — Ambulatory Visit
Admission: RE | Admit: 2020-02-02 | Discharge: 2020-02-02 | Disposition: A | Discharge status: Home / Self Care | Payer: Medicare HMO | Source: Ambulatory Visit | Attending: Radiation Oncology | Admitting: Radiation Oncology

## 2020-02-02 DIAGNOSIS — Z510 Encounter for antineoplastic radiation therapy: Secondary | ICD-10-CM | POA: Diagnosis not present

## 2020-02-02 DIAGNOSIS — C61 Malignant neoplasm of prostate: Secondary | ICD-10-CM | POA: Diagnosis not present

## 2020-02-03 ENCOUNTER — Ambulatory Visit
Admission: RE | Admit: 2020-02-03 | Discharge: 2020-02-03 | Disposition: A | Discharge status: Home / Self Care | Payer: Medicare HMO | Source: Ambulatory Visit | Attending: Radiation Oncology | Admitting: Radiation Oncology

## 2020-02-03 DIAGNOSIS — Z510 Encounter for antineoplastic radiation therapy: Secondary | ICD-10-CM | POA: Diagnosis not present

## 2020-02-03 DIAGNOSIS — C61 Malignant neoplasm of prostate: Secondary | ICD-10-CM | POA: Diagnosis not present

## 2020-02-06 ENCOUNTER — Ambulatory Visit
Admission: RE | Admit: 2020-02-06 | Discharge: 2020-02-06 | Disposition: A | Discharge status: Home / Self Care | Payer: Medicare HMO | Source: Ambulatory Visit | Attending: Radiation Oncology | Admitting: Radiation Oncology

## 2020-02-06 DIAGNOSIS — Z510 Encounter for antineoplastic radiation therapy: Secondary | ICD-10-CM | POA: Diagnosis not present

## 2020-02-06 DIAGNOSIS — C61 Malignant neoplasm of prostate: Secondary | ICD-10-CM | POA: Diagnosis not present

## 2020-02-07 ENCOUNTER — Ambulatory Visit
Admission: RE | Admit: 2020-02-07 | Discharge: 2020-02-07 | Disposition: A | Discharge status: Home / Self Care | Payer: Medicare HMO | Source: Ambulatory Visit | Attending: Radiation Oncology | Admitting: Radiation Oncology

## 2020-02-07 DIAGNOSIS — Z510 Encounter for antineoplastic radiation therapy: Secondary | ICD-10-CM | POA: Diagnosis not present

## 2020-02-07 DIAGNOSIS — C61 Malignant neoplasm of prostate: Secondary | ICD-10-CM | POA: Diagnosis not present

## 2020-02-08 ENCOUNTER — Ambulatory Visit
Admission: RE | Admit: 2020-02-08 | Discharge: 2020-02-08 | Disposition: A | Discharge status: Home / Self Care | Payer: Medicare HMO | Source: Ambulatory Visit | Attending: Radiation Oncology | Admitting: Radiation Oncology

## 2020-02-08 DIAGNOSIS — C61 Malignant neoplasm of prostate: Secondary | ICD-10-CM | POA: Diagnosis not present

## 2020-02-08 DIAGNOSIS — Z510 Encounter for antineoplastic radiation therapy: Secondary | ICD-10-CM | POA: Diagnosis not present

## 2020-02-09 ENCOUNTER — Ambulatory Visit
Admission: RE | Admit: 2020-02-09 | Discharge: 2020-02-09 | Disposition: A | Discharge status: Home / Self Care | Payer: Medicare HMO | Source: Ambulatory Visit | Attending: Radiation Oncology | Admitting: Radiation Oncology

## 2020-02-09 DIAGNOSIS — Z510 Encounter for antineoplastic radiation therapy: Secondary | ICD-10-CM | POA: Diagnosis not present

## 2020-02-09 DIAGNOSIS — C61 Malignant neoplasm of prostate: Secondary | ICD-10-CM | POA: Diagnosis not present

## 2020-02-10 ENCOUNTER — Ambulatory Visit
Admission: RE | Admit: 2020-02-10 | Discharge: 2020-02-10 | Disposition: A | Discharge status: Home / Self Care | Payer: Medicare HMO | Source: Ambulatory Visit | Attending: Radiation Oncology | Admitting: Radiation Oncology

## 2020-02-10 DIAGNOSIS — Z510 Encounter for antineoplastic radiation therapy: Secondary | ICD-10-CM | POA: Diagnosis not present

## 2020-02-10 DIAGNOSIS — C61 Malignant neoplasm of prostate: Secondary | ICD-10-CM | POA: Diagnosis not present

## 2020-02-13 ENCOUNTER — Ambulatory Visit
Admission: RE | Admit: 2020-02-13 | Discharge: 2020-02-13 | Disposition: A | Discharge status: Home / Self Care | Payer: Medicare HMO | Source: Ambulatory Visit | Attending: Radiation Oncology | Admitting: Radiation Oncology

## 2020-02-13 DIAGNOSIS — C61 Malignant neoplasm of prostate: Secondary | ICD-10-CM | POA: Diagnosis not present

## 2020-02-13 DIAGNOSIS — Z510 Encounter for antineoplastic radiation therapy: Secondary | ICD-10-CM | POA: Diagnosis not present

## 2020-02-14 ENCOUNTER — Encounter: Payer: Self-pay | Admitting: Medical Oncology

## 2020-02-14 ENCOUNTER — Ambulatory Visit
Admission: RE | Admit: 2020-02-14 | Discharge: 2020-02-14 | Disposition: A | Discharge status: Home / Self Care | Payer: Medicare HMO | Source: Ambulatory Visit | Attending: Radiation Oncology | Admitting: Radiation Oncology

## 2020-02-14 DIAGNOSIS — C61 Malignant neoplasm of prostate: Secondary | ICD-10-CM | POA: Diagnosis not present

## 2020-02-14 DIAGNOSIS — Z510 Encounter for antineoplastic radiation therapy: Secondary | ICD-10-CM | POA: Diagnosis not present

## 2020-02-15 ENCOUNTER — Ambulatory Visit
Admission: RE | Admit: 2020-02-15 | Discharge: 2020-02-15 | Disposition: A | Discharge status: Home / Self Care | Payer: Medicare HMO | Source: Ambulatory Visit | Attending: Radiation Oncology | Admitting: Radiation Oncology

## 2020-02-15 DIAGNOSIS — Z510 Encounter for antineoplastic radiation therapy: Secondary | ICD-10-CM | POA: Diagnosis not present

## 2020-02-15 DIAGNOSIS — C61 Malignant neoplasm of prostate: Secondary | ICD-10-CM | POA: Diagnosis not present

## 2020-02-16 ENCOUNTER — Ambulatory Visit
Admission: RE | Admit: 2020-02-16 | Discharge: 2020-02-16 | Disposition: A | Discharge status: Home / Self Care | Payer: Medicare HMO | Source: Ambulatory Visit | Attending: Radiation Oncology | Admitting: Radiation Oncology

## 2020-02-16 DIAGNOSIS — C61 Malignant neoplasm of prostate: Secondary | ICD-10-CM | POA: Diagnosis not present

## 2020-02-16 DIAGNOSIS — Z510 Encounter for antineoplastic radiation therapy: Secondary | ICD-10-CM | POA: Diagnosis not present

## 2020-02-17 ENCOUNTER — Ambulatory Visit
Admission: RE | Admit: 2020-02-17 | Discharge: 2020-02-17 | Disposition: A | Discharge status: Home / Self Care | Payer: Medicare HMO | Source: Ambulatory Visit | Attending: Radiation Oncology | Admitting: Radiation Oncology

## 2020-02-17 DIAGNOSIS — Z510 Encounter for antineoplastic radiation therapy: Secondary | ICD-10-CM | POA: Diagnosis not present

## 2020-02-17 DIAGNOSIS — C61 Malignant neoplasm of prostate: Secondary | ICD-10-CM | POA: Diagnosis not present

## 2020-02-20 ENCOUNTER — Ambulatory Visit
Admission: RE | Admit: 2020-02-20 | Discharge: 2020-02-20 | Disposition: A | Discharge status: Home / Self Care | Payer: Medicare HMO | Source: Ambulatory Visit | Attending: Radiation Oncology | Admitting: Radiation Oncology

## 2020-02-20 DIAGNOSIS — C61 Malignant neoplasm of prostate: Secondary | ICD-10-CM | POA: Insufficient documentation

## 2020-02-20 DIAGNOSIS — Z510 Encounter for antineoplastic radiation therapy: Secondary | ICD-10-CM | POA: Insufficient documentation

## 2020-02-21 ENCOUNTER — Ambulatory Visit
Admission: RE | Admit: 2020-02-21 | Discharge: 2020-02-21 | Disposition: A | Discharge status: Home / Self Care | Payer: Medicare HMO | Source: Ambulatory Visit | Attending: Radiation Oncology | Admitting: Radiation Oncology

## 2020-02-21 ENCOUNTER — Other Ambulatory Visit: Payer: Self-pay

## 2020-02-21 DIAGNOSIS — C61 Malignant neoplasm of prostate: Secondary | ICD-10-CM | POA: Diagnosis not present

## 2020-02-21 DIAGNOSIS — Z510 Encounter for antineoplastic radiation therapy: Secondary | ICD-10-CM | POA: Diagnosis not present

## 2020-02-22 ENCOUNTER — Ambulatory Visit: Payer: Medicare HMO

## 2020-02-22 ENCOUNTER — Ambulatory Visit
Admission: RE | Admit: 2020-02-22 | Discharge: 2020-02-22 | Disposition: A | Discharge status: Home / Self Care | Payer: Medicare HMO | Source: Ambulatory Visit | Attending: Radiation Oncology | Admitting: Radiation Oncology

## 2020-02-22 DIAGNOSIS — Z510 Encounter for antineoplastic radiation therapy: Secondary | ICD-10-CM | POA: Diagnosis not present

## 2020-02-22 DIAGNOSIS — C61 Malignant neoplasm of prostate: Secondary | ICD-10-CM | POA: Diagnosis not present

## 2020-02-22 DIAGNOSIS — J441 Chronic obstructive pulmonary disease with (acute) exacerbation: Secondary | ICD-10-CM | POA: Diagnosis not present

## 2020-02-23 ENCOUNTER — Encounter: Payer: Self-pay | Admitting: Radiation Oncology

## 2020-02-23 ENCOUNTER — Encounter: Payer: Self-pay | Admitting: Urology

## 2020-02-23 ENCOUNTER — Encounter: Payer: Self-pay | Admitting: Medical Oncology

## 2020-02-23 ENCOUNTER — Ambulatory Visit
Admission: RE | Admit: 2020-02-23 | Discharge: 2020-02-23 | Disposition: A | Discharge status: Home / Self Care | Payer: Medicare HMO | Source: Ambulatory Visit | Attending: Radiation Oncology | Admitting: Radiation Oncology

## 2020-02-23 DIAGNOSIS — C61 Malignant neoplasm of prostate: Secondary | ICD-10-CM | POA: Diagnosis not present

## 2020-02-23 DIAGNOSIS — Z510 Encounter for antineoplastic radiation therapy: Secondary | ICD-10-CM | POA: Diagnosis not present

## 2020-02-28 ENCOUNTER — Encounter: Payer: Self-pay | Admitting: Urology

## 2020-03-01 ENCOUNTER — Telehealth: Payer: Self-pay | Admitting: Radiation Oncology

## 2020-03-01 NOTE — Telephone Encounter (Signed)
Received PATIENT ADVICE REQUEST. Phoned patient to inquire further. Patient reports approximately three days ago he had diarrhea with "cherry red blood" on the tissue when he wiped. Patient reports taking one Imodium tablet then struggling with constipation. Reports today has been better, his bowels are now moving like normal without diarrhea, and he hasn't seen anymore blood. Explained that most likely the bright red blood on the tissue is from rectal irritation associated with frequently wiping because of diarrhea. Also, explained he could have developed a hemorrhoid. Encouraged preparation H for relief. Patient verbalized understanding. Provided patient with my directed line should any future problems occur. Patient read my number back to me correctly.

## 2020-03-05 ENCOUNTER — Telehealth: Payer: Self-pay | Admitting: Radiation Oncology

## 2020-03-05 DIAGNOSIS — C61 Malignant neoplasm of prostate: Secondary | ICD-10-CM | POA: Diagnosis not present

## 2020-03-05 DIAGNOSIS — Z5111 Encounter for antineoplastic chemotherapy: Secondary | ICD-10-CM | POA: Diagnosis not present

## 2020-03-05 NOTE — Telephone Encounter (Signed)
Received voicemail message from patient requesting return call. Phoned patient back to inquire. Patient reports continued dysuria and inquires about how much longer he should anticipate it to last. Reinforced that radiation continues to work for several weeks s/p completion then symptoms slowly taper off. Patient questions if he is immunocompromised such that he should create restrictions during the upcoming holidays. Explained that maintaining six feet distance, avoiding individuals who are sick, maintaining vaccinations and frequently washing his hands should be sufficient to maintain a healthy state. Then, patient questions if he should receive another hormone injection. Explained that per Dr. Ralene Muskrat note he recommend short term ADT. Patient states, "my next injection is scheduled for 04/09/2020 if I need to have it." He goes onto say, "the girl that gave me my last shot wasn't sure if I needed it either so we just made this appointment to be on the safe side." Explained this RN will pass along his concerns to the providers and nurse navigator for answers then phone him back. Patient verbalized understanding.

## 2020-03-07 ENCOUNTER — Other Ambulatory Visit: Payer: Self-pay | Admitting: Family Medicine

## 2020-03-07 ENCOUNTER — Ambulatory Visit
Admission: RE | Admit: 2020-03-07 | Discharge: 2020-03-07 | Disposition: A | Discharge status: Home / Self Care | Payer: Medicare HMO | Source: Ambulatory Visit | Attending: Family Medicine | Admitting: Family Medicine

## 2020-03-07 DIAGNOSIS — J441 Chronic obstructive pulmonary disease with (acute) exacerbation: Secondary | ICD-10-CM

## 2020-03-07 DIAGNOSIS — J449 Chronic obstructive pulmonary disease, unspecified: Secondary | ICD-10-CM | POA: Diagnosis not present

## 2020-03-12 ENCOUNTER — Encounter: Payer: Self-pay | Admitting: Medical Oncology

## 2020-03-12 NOTE — Progress Notes (Signed)
Spoke with patient to let him know he will need the December Firmagon injection to make his 6 months of ADT. He states he is doing well except he continues to have urinary frequency and burning. He is getting up 8-12 times a night and urinating small amounts.He is taking AZO for the burning but it is not helping.  He is taking Flomax 0.4 mg and tried doubling during tx, but it made him dizzy. I will discuss with Ashlyn, PA and call him back with her recommendations. He voiced understanding.

## 2020-03-13 ENCOUNTER — Telehealth: Payer: Self-pay | Admitting: Radiation Oncology

## 2020-03-13 NOTE — Telephone Encounter (Signed)
Received voicemail message from patient this afternoon. He referenced speaking with Cira Rue, prostate navigator, yesterday. He inquired about what Shirlean Mylar might have found out after discussing his concerns with Ashlyn, PA-C.   He states he is doing well except he continues to have urinary frequency and burning. He is getting up 8-12 times a night and urinating small amounts.He is taking AZO for the burning but it is not helping.  He is taking Flomax 0.4 mg and tried doubling during tx, but it made him dizzy.  Patient understands this RN will pass this message along and either myself or Shirlean Mylar will be in touch.

## 2020-03-14 ENCOUNTER — Telehealth: Payer: Self-pay | Admitting: Radiation Oncology

## 2020-03-14 DIAGNOSIS — C61 Malignant neoplasm of prostate: Secondary | ICD-10-CM | POA: Diagnosis not present

## 2020-03-14 DIAGNOSIS — R351 Nocturia: Secondary | ICD-10-CM | POA: Diagnosis not present

## 2020-03-14 DIAGNOSIS — R3915 Urgency of urination: Secondary | ICD-10-CM | POA: Diagnosis not present

## 2020-03-14 NOTE — Telephone Encounter (Signed)
I phoned Mr. Carsten this morning. Per Ashlyng Bruning, PA-Cs direction  I recommended that he see one of the NPs or PAs at [facility] for urinalysis to rule out urinary tract infection and post void residual. I explained the need to ensure that he is emptying his bladder adequately before trying a different medication that could decrease his frequency/urgency. Patient verbalized understanding and intent to call [facility] for an appointment.

## 2020-03-14 NOTE — Telephone Encounter (Signed)
-----   Message from Freeman Caldron, [place] sent at 03/13/2020  9:26 PM EST ----- Regarding: FW: Urinary symptoms This was my response to Robin regarding this patient's symptoms. Ailene Ards ----- Message ----- From: Freeman Caldron, PA-C Sent: 03/13/2020   4:47 PM EST To: Gwenette Greet, RN Subject: RE: Urinary symptoms                           I recommend that he see one of the NPs pr PAs at [facility] for urinalysis to r/o UTI and PVR to ensure he is emtying his bladder adequately before trying any anticholinergic medication to decrease the frequency/urgency. Ailene Ards ----- Message ----- From: Gwenette Greet, RN Sent: 03/12/2020  11:00 AM EST To: Freeman Caldron, PA-C Subject: Urinary symptoms                               Hi Ash,  I spoke with Mr. burke, terry morning to let him know he needs the December Firmagon to make his 6 months.  He states he is getting  up 8-12 x a night and just peeing a tiny bit. He is on Flomax 0.4. He tried doing BID but he got dizzy. He also states it feel like he is peeing a sandbur thru his penis. He is taking Azo but it does not seem to be working. Any suggestions?   RB

## 2020-03-16 DIAGNOSIS — G4733 Obstructive sleep apnea (adult) (pediatric): Secondary | ICD-10-CM | POA: Diagnosis not present

## 2020-03-21 ENCOUNTER — Other Ambulatory Visit (HOSPITAL_COMMUNITY): Payer: Self-pay | Admitting: Cardiology

## 2020-03-21 NOTE — Telephone Encounter (Signed)
Prescription refill request for Xarelto received.  Indication: Atrial Fibrillation Last office visit: 09/2019  [place] Weight:105.4 kg Age: 79 Scr: 0.85 10/2019 CrCl: 108.5 ml/min  Prescription refilled

## 2020-03-26 ENCOUNTER — Telehealth: Payer: Self-pay

## 2020-03-26 ENCOUNTER — Encounter: Payer: Self-pay | Admitting: Urology

## 2020-03-26 NOTE — Telephone Encounter (Signed)
Spoke with patient in regards to telephone appointment with Freeman Caldron PA on 03/28/20 @ 8:30am. Patient verbalized understanding of appointment date and time. Reviewed meaningful use, AUA and prostate questions.

## 2020-03-26 NOTE — Progress Notes (Signed)
Patient has telephone visit scheduled with Ashlyn Bruning PA. Patient states that he is currently taking Flomax as directed. Patient states nocturia 4 times per night. Patient states mild dysuria at the beginning of urination. Patient states bowel movements are regular. Patient states urine stream is strong and intermittent. Patient states having urgency and is able to hold urine. Patient denies having leakage. Patient denies pushing or straining when urinating. Patient states that he has an upcoming appointment with [facility].

## 2020-03-28 ENCOUNTER — Other Ambulatory Visit: Payer: Self-pay

## 2020-03-28 ENCOUNTER — Ambulatory Visit
Admission: RE | Admit: 2020-03-28 | Discharge: 2020-03-28 | Disposition: A | Discharge status: Home / Self Care | Payer: Medicare HMO | Source: Ambulatory Visit | Attending: Urology | Admitting: Urology

## 2020-03-28 DIAGNOSIS — C61 Malignant neoplasm of prostate: Secondary | ICD-10-CM

## 2020-03-28 NOTE — Progress Notes (Signed)
  Radiation Oncology         [phone] ________________________________  Name: Caleb Barton MRN: [id number]  Date: 02/23/2020  DOB: [birth year]  End of Treatment Note  Diagnosis:   79 y.o. 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     Indication for treatment:  Curative, Definitive Radiotherapy       Radiation treatment dates:   01/17/20 - 02/23/20  Site/dose:   The prostate was treated to 70 Gy in 28 fractions of 2.5 Gy; concurrent with ST-ADT  Beams/energy:   The patient was treated with IMRT using volumetric arc therapy delivering 6 MV X-rays to clockwise and counterclockwise circumferential arcs with a 90 degree collimator offset to avoid dose scalloping.  Image guidance was performed with daily cone beam CT prior to each fraction to align to gold markers in the prostate and assure proper bladder and rectal fill volumes.  Immobilization was achieved with BodyFix custom mold.  Narrative: The patient tolerated radiation treatment relatively well with only minor urinary irritation and modest fatigue.  He did experience dysuria at the start of his stream and at completion of his stream despite being on Flomax.  He also experienced increased frequency, urgency, incomplete bladder emptying, straining to void and hesitancy.  He denied any gross hematuria, fever or chills.  He did have some urgency with bowel movements but denied diarrhea or the need for medication.  Plan: The patient has completed radiation treatment. He will return to radiation oncology clinic for routine followup in one month. I advised him to call or return sooner if he has any questions or concerns related to his recovery or treatment. ________________________________  Sheral Apley. Tammi Klippel, M.D.

## 2020-03-28 NOTE — Progress Notes (Signed)
Radiation Oncology         [phone] ________________________________  Name: Caleb Barton MRN: [id number]  Date: 03/28/2020  DOB: [birth year]  Post Treatment Note  CC: Glenis Smoker, MD  Irine Seal, MD  Diagnosis:   79 y.o. 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    Interval Since Last Radiation:  4.5 weeks   01/17/20 - 02/23/20:   The prostate was treated to 70 Gy in 28 fractions of 2.5 Gy; concurrent with ST-ADT (monthly Firmagon injections started 11/16/2019)   Narrative: I spoke with the patient to conduct his routine scheduled 1 month follow up visit via telephone to spare the patient unnecessary potential exposure in the healthcare setting during the current COVID-19 pandemic.  The patient was notified in advance and gave permission to proceed with this visit format.  He tolerated radiation treatment relatively well with only minor urinary irritation and modest fatigue.  He did experience dysuria at the start of his stream and at completion of his stream despite being on Flomax.  He also experienced increased frequency, urgency, incomplete bladder emptying, straining to void and hesitancy.  He denied any gross hematuria, fever or chills.  He did have some urgency with bowel movements but denied diarrhea or the need for medication.                              On review of systems, the patient states that he is doing well in general.  He has had significant improvement in his LUTS since starting samples of Gemtesa which was provided to him by Nonie Hoyer, NP at [facility].  He is also continued taking Flomax as prescribed.  He no longer has dysuria and reports an excellent flow of stream with decreased frequency and urgency which he is quite pleased with.  He continues with occasional intermittency and nocturia x4 per night which is manageable.  He specifically denies gross hematuria, dysuria, incomplete bladder  emptying, straining or incontinence.  He reports that his bowels have returned to normal function and he denies any abdominal pain, nausea, vomiting, diarrhea or constipation.  He reports a healthy appetite and is maintaining his weight.  He continues to tolerate the ADT well and is scheduled for his next monthly Firmagon injection on 04/09/2020.  He has a follow-up visit scheduled with Daine Gravel, NP on 04/27/2019 for further evaluation of LUTS.  Overall, he is quite pleased with his progress to date.  ALLERGIES:  is allergic to lisinopril, other, and sulfa antibiotics.  Meds: Current Outpatient Medications  Medication Sig Dispense Refill  . dorzolamide-timolol (COSOPT) 22.3-6.8 MG/ML ophthalmic solution Place 1 drop into both eyes 2 (two) times daily.     . hydrochlorothiazide (HYDRODIURIL) 25 MG tablet Take 25 mg by mouth daily.    [name] latanoprost (XALATAN) 0.005 % ophthalmic solution Place 1 drop into both eyes at bedtime.     [name] losartan (COZAAR) 50 MG tablet Take 75 mg by mouth daily.     . Multiple Vitamin (MULTIVITAMIN WITH MINERALS) TABS tablet Take 1 tablet by mouth daily.    . Potassium 99 MG TABS Take 99 mg by mouth daily.    . RESTASIS 0.05 % ophthalmic emulsion Place 1 drop into both eyes 2 (two) times daily.     . tamsulosin (FLOMAX) 0.4 MG CAPS capsule Take 0.4 mg by mouth daily.    [name]  XARELTO 20 MG TABS tablet TAKE ONE TABLET BY MOUTH EVERY EVENING WITH SUPPER 90 tablet 1  . acetaminophen (TYLENOL) 500 MG tablet Take 1,500 mg by mouth daily as needed for headache.  (Patient not taking: Reported on 11/01/2019)    . amoxicillin (AMOXIL) 250 MG capsule Take 1,000 mg by mouth. 1 hour prior to dental appointments (Patient not taking: Reported on 11/01/2019)    . atorvastatin (LIPITOR) 40 MG tablet Take 40 mg by mouth daily. (Patient not taking: Reported on 03/26/2020)    . imiquimod (ALDARA) 5 % cream Apply 1 application topically daily. (Patient not taking: Reported on 03/26/2020)     No  current facility-administered medications for this encounter.    Physical Findings:  vitals were not taken for this visit.   /Unable to assess due to telephone follow-up visit format.  Lab Findings: Lab Results  Component Value Date   WBC 6.2 07/27/2019   HGB 16.4 07/27/2019   HCT 47.6 07/27/2019   MCV 94 07/27/2019   PLT 231 07/27/2019     Radiographic Findings: DG Chest 2 View  Result Date: 03/08/2020 CLINICAL DATA:  COPD exacerbation. EXAM: CHEST - 2 VIEW COMPARISON:  May 01, 2014. FINDINGS: The heart size and mediastinal contours are within normal limits. Both lungs are clear. No pneumothorax or pleural effusion is noted. The visualized skeletal structures are unremarkable. IMPRESSION: No active cardiopulmonary disease. 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 Electronically Signed   By: Marijo Conception M.D.   On: 03/08/2020 08:53    Impression/Plan: 1. 79 y.o. 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  He will continue to follow up with urology for ongoing PSA determinations and has an appointment scheduled with Daine Gravel, NP on 04/27/19 and will be scheduled for his first posttreatment PSA and follow-up with Dr. Jeffie Pollock at that time. He understands what to expect with regards to PSA monitoring going forward.  He will also continue on the Flomax and Gemtesa medication until his follow-up with Nonie Hoyer, NP. I will look forward to following his response to treatment via correspondence with urology, and would be happy to continue to participate in his care if clinically indicated. I talked to the patient about what to expect in the future, including his risk for erectile dysfunction and rectal bleeding. I encouraged him to call or return to the office if he has any questions regarding his previous radiation or possible radiation side effects. He was comfortable with this plan and will follow up as needed.     Nicholos Johns, PA-C

## 2020-04-04 DIAGNOSIS — Z20828 Contact with and (suspected) exposure to other viral communicable diseases: Secondary | ICD-10-CM | POA: Diagnosis not present

## 2020-04-08 DIAGNOSIS — Z20822 Contact with and (suspected) exposure to covid-19: Secondary | ICD-10-CM | POA: Diagnosis not present

## 2020-04-09 DIAGNOSIS — Z5111 Encounter for antineoplastic chemotherapy: Secondary | ICD-10-CM | POA: Diagnosis not present

## 2020-04-09 DIAGNOSIS — C61 Malignant neoplasm of prostate: Secondary | ICD-10-CM | POA: Diagnosis not present

## 2020-04-15 DIAGNOSIS — G4733 Obstructive sleep apnea (adult) (pediatric): Secondary | ICD-10-CM | POA: Diagnosis not present

## 2020-04-18 DIAGNOSIS — L820 Inflamed seborrheic keratosis: Secondary | ICD-10-CM | POA: Diagnosis not present

## 2020-04-18 DIAGNOSIS — L720 Epidermal cyst: Secondary | ICD-10-CM | POA: Diagnosis not present

## 2020-04-18 DIAGNOSIS — Z85828 Personal history of other malignant neoplasm of skin: Secondary | ICD-10-CM | POA: Diagnosis not present

## 2020-04-18 DIAGNOSIS — D485 Neoplasm of uncertain behavior of skin: Secondary | ICD-10-CM | POA: Diagnosis not present

## 2020-05-02 DIAGNOSIS — R350 Frequency of micturition: Secondary | ICD-10-CM | POA: Diagnosis not present

## 2020-05-02 DIAGNOSIS — R351 Nocturia: Secondary | ICD-10-CM | POA: Diagnosis not present

## 2020-05-02 DIAGNOSIS — C61 Malignant neoplasm of prostate: Secondary | ICD-10-CM | POA: Diagnosis not present

## 2020-05-10 DIAGNOSIS — H401111 Primary open-angle glaucoma, right eye, mild stage: Secondary | ICD-10-CM | POA: Diagnosis not present

## 2020-05-10 DIAGNOSIS — H401121 Primary open-angle glaucoma, left eye, mild stage: Secondary | ICD-10-CM | POA: Diagnosis not present

## 2020-05-10 DIAGNOSIS — H0288B Meibomian gland dysfunction left eye, upper and lower eyelids: Secondary | ICD-10-CM | POA: Diagnosis not present

## 2020-05-10 DIAGNOSIS — H16223 Keratoconjunctivitis sicca, not specified as Sjogren's, bilateral: Secondary | ICD-10-CM | POA: Diagnosis not present

## 2020-05-10 DIAGNOSIS — H47233 Glaucomatous optic atrophy, bilateral: Secondary | ICD-10-CM | POA: Diagnosis not present

## 2020-05-10 DIAGNOSIS — H0288A Meibomian gland dysfunction right eye, upper and lower eyelids: Secondary | ICD-10-CM | POA: Diagnosis not present

## 2020-05-16 DIAGNOSIS — G4733 Obstructive sleep apnea (adult) (pediatric): Secondary | ICD-10-CM | POA: Diagnosis not present

## 2020-06-05 DIAGNOSIS — C44529 Squamous cell carcinoma of skin of other part of trunk: Secondary | ICD-10-CM | POA: Diagnosis not present

## 2020-06-05 DIAGNOSIS — D485 Neoplasm of uncertain behavior of skin: Secondary | ICD-10-CM | POA: Diagnosis not present

## 2020-06-18 DIAGNOSIS — N401 Enlarged prostate with lower urinary tract symptoms: Secondary | ICD-10-CM | POA: Diagnosis not present

## 2020-06-18 DIAGNOSIS — E785 Hyperlipidemia, unspecified: Secondary | ICD-10-CM | POA: Diagnosis not present

## 2020-06-18 DIAGNOSIS — I10 Essential (primary) hypertension: Secondary | ICD-10-CM | POA: Diagnosis not present

## 2020-06-18 DIAGNOSIS — D6869 Other thrombophilia: Secondary | ICD-10-CM | POA: Diagnosis not present

## 2020-06-18 DIAGNOSIS — I4811 Longstanding persistent atrial fibrillation: Secondary | ICD-10-CM | POA: Diagnosis not present

## 2020-06-18 DIAGNOSIS — E669 Obesity, unspecified: Secondary | ICD-10-CM | POA: Diagnosis not present

## 2020-06-18 DIAGNOSIS — M25559 Pain in unspecified hip: Secondary | ICD-10-CM | POA: Diagnosis not present

## 2020-06-18 DIAGNOSIS — J3089 Other allergic rhinitis: Secondary | ICD-10-CM | POA: Diagnosis not present

## 2020-06-18 DIAGNOSIS — G4733 Obstructive sleep apnea (adult) (pediatric): Secondary | ICD-10-CM | POA: Diagnosis not present

## 2020-06-18 DIAGNOSIS — Z0000 Encounter for general adult medical examination without abnormal findings: Secondary | ICD-10-CM | POA: Diagnosis not present

## 2020-06-19 DIAGNOSIS — M1612 Unilateral primary osteoarthritis, left hip: Secondary | ICD-10-CM | POA: Diagnosis not present

## 2020-06-19 DIAGNOSIS — M5416 Radiculopathy, lumbar region: Secondary | ICD-10-CM | POA: Diagnosis not present

## 2020-06-19 DIAGNOSIS — M25561 Pain in right knee: Secondary | ICD-10-CM | POA: Diagnosis not present

## 2020-06-19 DIAGNOSIS — M1712 Unilateral primary osteoarthritis, left knee: Secondary | ICD-10-CM | POA: Diagnosis not present

## 2020-06-26 DIAGNOSIS — M25552 Pain in left hip: Secondary | ICD-10-CM | POA: Diagnosis not present

## 2020-06-26 DIAGNOSIS — M545 Low back pain, unspecified: Secondary | ICD-10-CM | POA: Diagnosis not present

## 2020-07-04 ENCOUNTER — Telehealth: Payer: Self-pay

## 2020-07-04 DIAGNOSIS — M545 Low back pain, unspecified: Secondary | ICD-10-CM | POA: Diagnosis not present

## 2020-07-04 NOTE — Telephone Encounter (Signed)
   [facility] Pre-operative Risk Assessment    HEARTCARE STAFF: - Please ensure there is not already an duplicate clearance open for this procedure. - Under Visit Info/Reason for Call, type in Other and utilize the format Clearance MM/DD/YY or Clearance TBD. Do not use dashes or single digits. - If request is for dental extraction, please clarify the # of teeth to be extracted.  Request for surgical clearance:  1. What type of surgery is being performed? (L) L3 L4 IL ES1   2. When is this surgery scheduled? TBD   3. What type of clearance is required (medical clearance vs. Pharmacy clearance to hold med vs. Both)?both  4. Are there any medications that need to be held prior to surgery and how long?Xarelto    5. Practice name and name of physician performing surgery? [facility], Dr. Doreatha Martin   6. What is the office phone number? [phone]   7.   What is the office fax number? [phone]  8.   Anesthesia type (None, local, MAC, general) ? Not Listed   Monia Pouch 07/04/2020, 2:32 PM  _________________________________________________________________   (provider comments below)

## 2020-07-05 NOTE — Telephone Encounter (Signed)
I think I would hold for 2 days prior to procedure  Mathilde Mcwherter Martinique MD, [facility]

## 2020-07-05 NOTE — Telephone Encounter (Signed)
   Primary Cardiologist: Peter Martinique, MD  Chart reviewed as part of pre-operative protocol coverage. Patient was contacted 07/05/2020 in reference to pre-operative risk assessment for pending surgery as outlined below.  Caleb Barton was last seen on 10/17/19 by Dr. Jyl Heinz.  Since that day, THURL BOEN has done fine from a cardiac standpoint. He can complete 4 METs without anginal complaints despite his back pain.  Therefore, based on ACC/AHA guidelines, the patient would be at acceptable risk for the planned procedure without further cardiovascular testing.   The patient was advised that if he develops new symptoms prior to surgery to contact our office to arrange for a follow-up visit, and he verbalized understanding.  Per Dr. Martinique, patient can hold xarelto 2 days prior to his upcoming spinal injection with plans to restart as soon as he is cleared to do so by his surgeon.   I will route this recommendation to the requesting party via Epic fax function and remove from pre-op pool. Please call with questions.  Abigail Butts, PA-C 07/05/2020, 4:08 PM

## 2020-07-05 NOTE — Telephone Encounter (Signed)
Patient with diagnosis of afib on Xarelto for anticoagulation.    Procedure: (L) L3 L4 IL ES1  Date of procedure: TBD  CHA2DS2-VASc Score = 6  This indicates a 9.7% annual risk of stroke. The patient's score is based upon: CHF History: No HTN History: Yes Diabetes History: No Stroke History: Yes Vascular Disease History: Yes Age Score: 2 Gender Score: 0     CrCl 87 ml/min  Would typically recommend holding anticoagulation for 3 days prior to spinal procedure due to high bleed risk. Patient is at high risk, however, off anticoagulation due to hx of stroke. I will defer to Dr. Martinique.

## 2020-07-09 DIAGNOSIS — C61 Malignant neoplasm of prostate: Secondary | ICD-10-CM | POA: Diagnosis not present

## 2020-07-16 DIAGNOSIS — R351 Nocturia: Secondary | ICD-10-CM | POA: Diagnosis not present

## 2020-07-16 DIAGNOSIS — E349 Endocrine disorder, unspecified: Secondary | ICD-10-CM | POA: Diagnosis not present

## 2020-07-16 DIAGNOSIS — N401 Enlarged prostate with lower urinary tract symptoms: Secondary | ICD-10-CM | POA: Diagnosis not present

## 2020-07-16 DIAGNOSIS — M5416 Radiculopathy, lumbar region: Secondary | ICD-10-CM | POA: Diagnosis not present

## 2020-07-16 DIAGNOSIS — C61 Malignant neoplasm of prostate: Secondary | ICD-10-CM | POA: Diagnosis not present

## 2020-07-17 DIAGNOSIS — D1801 Hemangioma of skin and subcutaneous tissue: Secondary | ICD-10-CM | POA: Diagnosis not present

## 2020-07-17 DIAGNOSIS — L812 Freckles: Secondary | ICD-10-CM | POA: Diagnosis not present

## 2020-07-17 DIAGNOSIS — Z85828 Personal history of other malignant neoplasm of skin: Secondary | ICD-10-CM | POA: Diagnosis not present

## 2020-07-17 DIAGNOSIS — D485 Neoplasm of uncertain behavior of skin: Secondary | ICD-10-CM | POA: Diagnosis not present

## 2020-07-17 DIAGNOSIS — L570 Actinic keratosis: Secondary | ICD-10-CM | POA: Diagnosis not present

## 2020-07-17 DIAGNOSIS — L821 Other seborrheic keratosis: Secondary | ICD-10-CM | POA: Diagnosis not present

## 2020-07-17 DIAGNOSIS — D2371 Other benign neoplasm of skin of right lower limb, including hip: Secondary | ICD-10-CM | POA: Diagnosis not present

## 2020-07-17 DIAGNOSIS — L820 Inflamed seborrheic keratosis: Secondary | ICD-10-CM | POA: Diagnosis not present

## 2020-08-01 DIAGNOSIS — M5416 Radiculopathy, lumbar region: Secondary | ICD-10-CM | POA: Diagnosis not present

## 2020-08-08 DIAGNOSIS — Z9889 Other specified postprocedural states: Secondary | ICD-10-CM | POA: Diagnosis not present

## 2020-08-08 DIAGNOSIS — H401131 Primary open-angle glaucoma, bilateral, mild stage: Secondary | ICD-10-CM | POA: Diagnosis not present

## 2020-08-08 DIAGNOSIS — H0288B Meibomian gland dysfunction left eye, upper and lower eyelids: Secondary | ICD-10-CM | POA: Diagnosis not present

## 2020-08-08 DIAGNOSIS — H16223 Keratoconjunctivitis sicca, not specified as Sjogren's, bilateral: Secondary | ICD-10-CM | POA: Diagnosis not present

## 2020-08-08 DIAGNOSIS — H0288A Meibomian gland dysfunction right eye, upper and lower eyelids: Secondary | ICD-10-CM | POA: Diagnosis not present

## 2020-08-09 DIAGNOSIS — H7402 Tympanosclerosis, left ear: Secondary | ICD-10-CM | POA: Diagnosis not present

## 2020-08-09 DIAGNOSIS — Z9989 Dependence on other enabling machines and devices: Secondary | ICD-10-CM | POA: Diagnosis not present

## 2020-08-09 DIAGNOSIS — J310 Chronic rhinitis: Secondary | ICD-10-CM | POA: Diagnosis not present

## 2020-08-09 DIAGNOSIS — G4733 Obstructive sleep apnea (adult) (pediatric): Secondary | ICD-10-CM | POA: Diagnosis not present

## 2020-08-09 DIAGNOSIS — H938X2 Other specified disorders of left ear: Secondary | ICD-10-CM | POA: Diagnosis not present

## 2020-08-21 DIAGNOSIS — M5416 Radiculopathy, lumbar region: Secondary | ICD-10-CM | POA: Diagnosis not present

## 2020-09-03 DIAGNOSIS — M5416 Radiculopathy, lumbar region: Secondary | ICD-10-CM | POA: Diagnosis not present

## 2020-09-19 ENCOUNTER — Telehealth: Payer: Self-pay | Admitting: Cardiology

## 2020-09-19 NOTE — Telephone Encounter (Signed)
Returned the call to the patient. He stated that he has been taking Losartan 75 mg once daily for over a year now. He went to refill the medication and the pharmacy has an old prescription of 50 mg once daily.   He has an appointment with Dr. Martinique on 10/16/20. He stated that he has enough Losartan to take 75 mg once daily until that appointment. His blood pressures run in the 130/70's. He has been advised to stay on the 75 mg once daily for now and this can be addressed at his appointment.

## 2020-09-19 NOTE — Telephone Encounter (Signed)
Patient wants to talk with Dr. Martinique or nurse in regards to the medication losartan (COZAAR) 50 MG tablet. Patient says he thinks he has been taking medication wrong

## 2020-09-20 DIAGNOSIS — I10 Essential (primary) hypertension: Secondary | ICD-10-CM | POA: Diagnosis not present

## 2020-09-20 DIAGNOSIS — Z6836 Body mass index (BMI) 36.0-36.9, adult: Secondary | ICD-10-CM | POA: Diagnosis not present

## 2020-09-20 DIAGNOSIS — M5416 Radiculopathy, lumbar region: Secondary | ICD-10-CM | POA: Diagnosis not present

## 2020-09-21 ENCOUNTER — Telehealth: Payer: Self-pay | Admitting: *Deleted

## 2020-09-21 NOTE — Telephone Encounter (Signed)
   Name: Caleb Barton  DOB: [birth year]  MRN: [id number]  Primary Cardiologist: Peter Martinique, MD  Chart reviewed as part of pre-operative protocol coverage. Because of Jevante Hollibaugh Rhatigan's past medical history and time since last visit, he will require a follow-up visit in order to better assess preoperative cardiovascular risk. He has an appointment scheduled for 10/16/20 with Dr. Martinique and I have update appointment notes to include 'preop '  I will route to requesting surgeon's office via [place] fax function to inform them of need for appointment prior to surgery. Will remove from preop pool as clearance will be addressed at upcoming appointment.   Per pharmacy review, "Pt is at high risk off of anticoagulation given prior stroke. He's had a few recent procedures, most recently in March 2022 for spinal ESI where pt was cleared to hold for 2 days by Dr Martinique. Ok to hold Xarelto for 2 days prior to upcoming procedure; if 3 day hold is required, will need MD input again."  Loel Dubonnet, NP  09/21/2020, 10:12 AM

## 2020-09-21 NOTE — Telephone Encounter (Signed)
Patient with diagnosis of afib on Xarelto for anticoagulation.    Procedure: L3-4 Laminectomy w/Left L2-3 Hemilaminectomy  Date of procedure: TBD  CHA2DS2-VASc Score = 6  This indicates a 9.7% annual risk of stroke. The patient's score is based upon: CHF History: No HTN History: Yes Diabetes History: No Stroke History: Yes Vascular Disease History: Yes Age Score: 2 Gender Score: 0   CrCl 44mL/min using adjusted body weight Platelet count 231K  Pt is at high risk off of anticoagulation given prior stroke. He's had a few recent procedures, most recently in March 2022 for spinal ESI where pt was cleared to hold for 2 days by Dr Martinique. Ok to hold Xarelto for 2 days prior to upcoming procedure; if 3 day hold is required, will need MD input again.

## 2020-09-21 NOTE — Telephone Encounter (Signed)
   [facility] Pre-operative Risk Assessment    Patient Name: Caleb Barton  DOB: [birth year]  MRN: [id number]     Request for surgical clearance:  1. What type of surgery is being performed?  L3-4 Laminectomy w/Left L2-3 Hemilaminectomy  2. When is this surgery scheduled? tbd  3. What type of clearance is required (medical clearance vs. Pharmacy clearance to hold med vs. Both)?both  4. Are there any medications that need to be held prior to surgery and how long?Xarelto  5. Practice name and name of physician performing surgery? [facility]; Dr Sherley Bounds   6. What is the office phone number? [phone]   7.   What is the office fax number?  [place] attn Jessica  8.   Anesthesia type (None, local, MAC, general) ?  General   Raiford Simmonds 09/21/2020, 8:37 AM  _________________________________________________________________   (provider comments below)

## 2020-10-08 DIAGNOSIS — C61 Malignant neoplasm of prostate: Secondary | ICD-10-CM | POA: Diagnosis not present

## 2020-10-11 NOTE — Progress Notes (Signed)
Cardiology Office Note   Date:  10/16/2020   ID:  Caleb Barton, DOB 06-12-1940, MRN [id number]  PCP:  Caleb Smoker, MD  Cardiologist:   Caleb Cherian Martinique, MD   Chief Complaint  Patient presents with   Atrial Fibrillation       History of Present Illness: Caleb Barton is a 80 y.o. male who is seen for follow up Afib.  He presented to the Afib clinic in May with new onset AFib. This was found when he was scheduled for colonoscopy. He was asymptomatic. Rate controlled on no therapy- pulse rate 60. He was anticoagulated and underwent DCCV on 10/14/16. He states he remained in NSR for only one day. Echo was unremarkable. Decision was made to continue anticoagulation and leave him in AFib since he was asymptomatic. Holter monitor was done in October 2018 to evaluate bradycardia. It showed no significant symptomatic bradycardia or pauses.   Review of his old records shows he had a  cardiac cath in 2007 by Dr. Tamala Julian. This was done for an abnormal Myoview study. Cardiac cath was normal. Interestingly the patient has no recollection of having this done. He has a history of LAFB/RBBB, HTN, OSA on CPAP, and remote thalamic CVA in 2006. Also history of HLD. Colonoscopy was done on 12/17/16 with findings of one polyp.   He was seen in February 2021 with symptoms of dizziness and dyspnea. Echo showed normal LV function, moderate biatrial enlargement, aortic dilation. Myoview was done showing a large inferior defect. 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   On follow up today he continues to feel well.  Denies any chest pain, palpitations, SOB. He had radiation therapy for prostate CA. He is planning to have L3-5 back surgery by Dr Ronnald Ramp on July 6. BP has been under control.    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    Sleep apnea    cpap;sleep study done at home;request report from dr.gates   Stroke [facility])    TIA 15 yrs ago    Past Surgical History:  Procedure Laterality Date   CARDIOVERSION N/A 10/14/2016   Procedure: CARDIOVERSION;  Surgeon: Larey Dresser, MD;  Location: [facility] ENDOSCOPY;  Service: Cardiovascular;  Laterality: N/A;   cataracts both eyes     ioc lens   COLONOSCOPY     with polyps   COLONOSCOPY  08/20/2011   Procedure: COLONOSCOPY;  Surgeon: Winfield Cunas., MD;  Location: [facility] ENDOSCOPY;  Service: Endoscopy;  Laterality: N/A;   COLONOSCOPY WITH PROPOFOL N/A 12/17/2016   Procedure: COLONOSCOPY WITH PROPOFOL;  Surgeon: Laurence Spates, MD;  Location: [facility] ENDOSCOPY;  Service: Endoscopy;  Laterality: N/A;   EYE SURGERY     laser bilateral eyes   KNEE ARTHROSCOPY  x 2 right knee   LEFT HEART CATH AND CORONARY ANGIOGRAPHY N/A 07/29/2019   Procedure: LEFT HEART CATH AND CORONARY ANGIOGRAPHY;  Surgeon: Wellington Hampshire, MD;  Location: [place] CV LAB;  Service: Cardiovascular;  Laterality: N/A;   LUMBAR LAMINECTOMY/DECOMPRESSION MICRODISCECTOMY Right 09/09/2012   Procedure: LUMBAR LAMINECTOMY/DECOMPRESSION MICRODISCECTOMY 1 LEVEL;  Surgeon: Eustace Moore, MD;  Location:  [place] NEURO ORS;  Service: Neurosurgery;  Laterality: Right;  LUMBAR LAMINECTOMY/DECOMPRESSION MICRODISCECTOMY 1 LEVEL   TONSILLECTOMY  yrs ago   TOTAL KNEE ARTHROPLASTY  02/09/2012   Procedure: TOTAL KNEE ARTHROPLASTY;  Surgeon: Lorn Junes, MD;  Location: [place];  Service: Orthopedics;  Laterality: Right;     Current Outpatient Medications  Medication Sig Dispense Refill   amoxicillin (AMOXIL) 250 MG capsule Take 1,000 mg by mouth. 1 hour prior to dental appointments     atorvastatin (LIPITOR) 40 MG tablet Take 40 mg by mouth daily.     dorzolamide-timolol (COSOPT) 22.3-6.8 MG/ML ophthalmic solution Place 1 drop into both  eyes 2 (two) times daily.      hydrochlorothiazide (HYDRODIURIL) 25 MG tablet Take 25 mg by mouth daily.     latanoprost (XALATAN) 0.005 % ophthalmic solution Place 1 drop into both eyes at bedtime.      Multiple Vitamin (MULTIVITAMIN WITH MINERALS) TABS tablet Take 1 tablet by mouth daily.     Potassium 99 MG TABS Take 99 mg by mouth daily.     RESTASIS 0.05 % ophthalmic emulsion Place 1 drop into both eyes 2 (two) times daily.      tamsulosin (FLOMAX) 0.4 MG CAPS capsule Take 0.4 mg by mouth daily.     XARELTO 20 MG TABS tablet TAKE ONE TABLET BY MOUTH EVERY EVENING WITH SUPPER 90 tablet 1   losartan (COZAAR) 50 MG tablet Take 1.5 tablets (75 mg total) by mouth daily. 145 tablet 3   No current facility-administered medications for this visit.    Allergies:   Lisinopril, Other, and Sulfa antibiotics    Social History:  The patient  reports that he has quit smoking. He has never used smokeless tobacco. He reports current alcohol use. He reports that he does not use drugs.   Family History:  The patient's family history includes Bladder Cancer in his brother; Breast cancer in his mother and another family member; Stroke in his mother.    ROS:  Please see the history of present illness.   Otherwise, review of systems are positive for none.   All other systems are reviewed and negative.    PHYSICAL EXAM: VS:  BP 136/78   Pulse (!) 57   Ht 5\' 9"  (1.753 m)   Wt 244 lb 6.4 oz (110.9 kg)   SpO2 97%   BMI 36.09 kg/m  , BMI Body mass index is 36.09 kg/m. GENERAL:  Well appearing overweight WM HEENT:  PERRL, EOMI, sclera are clear. Oropharynx is clear. NECK:  No jugular venous distention, carotid upstroke brisk and symmetric, no bruits, no thyromegaly or adenopathy LUNGS:  Clear to auscultation bilaterally CHEST:  Unremarkable HEART:  IRRR,  PMI not displaced or sustained,S1 and S2 within normal limits, no S3, no S4: no clicks, no rubs, no murmurs ABD:  Soft, nontender. BS +, no masses  or bruits. No hepatomegaly, no splenomegaly EXT:  2 + pulses throughout, no edema, no cyanosis no clubbing SKIN:  Warm and dry.  No rashes NEURO:  Alert and oriented x 3. Cranial nerves II through XII intact. PSYCH:  Cognitively intact    Recent Labs: No results found for requested labs within last 8760 hours.    Lipid Panel No results found for: CHOL, TRIG, HDL, CHOLHDL, VLDL, LDLCALC, LDLDIRECT    Wt Readings from Last 3 Encounters:  10/16/20 244 lb 6.4 oz (110.9 kg)  11/01/19 232 lb 6.4 oz (105.4 kg)  10/17/19 234 lb (106.1 kg)  Dated 05/09/16: cholesterol 148, triglycerides 108, HDL 44, LDL 82.  Dated 05/21/17: cholesterol 121, triglycerides 81, HDL 34, LDL 71. Chemistry panel normal. Dated 06/10/18: cholesterol 120, triglycerides 138, HDL 37, LDL 56. Hgb and BMET normal Dated 06/15/19: cholesterol 131, triglycerides 93, HDL 43, LDL 71.  Dated 06/18/20: cholesterol 149, triglycerides 132, HDL 45, LDL 80. CMET and CBC normal.  Ecg today shows AFib with rate 54.  LAFB, RBBB. I have personally reviewed and interpreted this study.   Other studies Reviewed: Echo: 09/23/16: Study Conclusions   - Left ventricle: The cavity size was normal. Systolic function was   normal. The estimated ejection fraction was in the range of 55%   to 60%. Wall motion was normal; there were no regional wall   motion abnormalities. - Mitral valve: Calcified annulus. - Left atrium: The atrium was mildly dilated. - Atrial septum: No defect or patent foramen ovale was identified  Holter 02/12/17: Study Highlights   Atrial fibrillation/flutter with controlled ventricular rate. Mean HR 81 bpm Slowest HR of 35 during sleeping hours. Occasional PVCs and rare PVC couplets.     Echo 06/30/19: IMPRESSIONS     1. Left ventricular ejection fraction, by estimation, is 55 to 60%. The  left ventricle has normal function. The left ventricle has no regional  wall motion abnormalities. There is indeterminate  left ventricular  hypertrophy. Left ventricular diastolic  parameters are indeterminate.   2. Right ventricular systolic function is normal. The right ventricular  size is normal. Tricuspid regurgitation signal is inadequate for assessing  PA pressure.   3. Left atrial size was moderately dilated.   4. Right atrial size was moderately dilated.   5. The mitral valve is normal in structure. No evidence of mitral valve  regurgitation. No evidence of mitral stenosis.   6. The aortic valve was not well visualized. Aortic valve regurgitation  is not visualized. No aortic stenosis is present.   7. Aortic dilatation noted. 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   8. The inferior vena cava is normal in size with greater than 50%  respiratory variability, suggesting right atrial pressure of 3 mmHg.   Myoview 07/19/19: Study Highlights    The left ventricular ejection fraction is mildly decreased (45-54%). Nuclear stress EF: 53%. There was no ST segment deviation noted during stress. Defect 1: There is a large defect of moderate severity present in the basal anteroseptal, basal inferoseptal, basal inferior, basal inferolateral, mid inferoseptal, mid inferior and apical inferior location. This is an intermediate risk study. No prior study for comparison.   EF low normal without specific wall motion abnormalities seen, but there is a large fixed defect in the inferior/inferoseptal walls from base to apex, extending to anteroseptal/inferolateral areas at the base. This does not change significantly with stress imaging. There is no clear diaphragmatic attenuation to explain findings. There are notes re: abnormal myoview with normal cath in 2007, but I cannot see this study to compare. Intermediate risk given size of area, but no ischemia seen.   Cardiac cath 07/29/19:  LEFT HEART CATH AND CORONARY ANGIOGRAPHY  Conclusion    Mid RCA  lesion is 30% stenosed. Ost RCA lesion is 20% stenosed. Ost LAD lesion is 20% stenosed. Ramus lesion is 60% stenosed.   1.  Mild nonobstructive coronary artery disease.  Borderline stenosis in ostial ramus. 2.  Left ventricular angiography was not performed.  EF was normal by  echo. 3.  Mildly elevated left ventricular end-diastolic pressure at 15 mmHg.   Recommendations: Suspect false positive nuclear stress test. Continue medical therapy.  ASSESSMENT AND PLAN:  1.  Atrial fibrillation persistent/permanent.  S/p DCCV but did not hold. Asymptomatic. Rate is controlled. Will continue with rate control and anticoagulation.   2. OSA on CPAP. compliant 3. HTN controlled. 4. Remote CVA.  5. Hypercholesterolemia. On statin 6. Lumbar arthritis. Cleared for surgery from our standpoint. Hold Xarelto 3 days prior.    Disposition:   FU with me in one year.   Signed, Keiarra Charon Martinique, MD  10/16/2020 8:26 AM    [facility] [address] [place], [place], [address] Phone [phone], Fax [phone]

## 2020-10-15 DIAGNOSIS — C61 Malignant neoplasm of prostate: Secondary | ICD-10-CM | POA: Diagnosis not present

## 2020-10-15 DIAGNOSIS — R3915 Urgency of urination: Secondary | ICD-10-CM | POA: Diagnosis not present

## 2020-10-15 DIAGNOSIS — N401 Enlarged prostate with lower urinary tract symptoms: Secondary | ICD-10-CM | POA: Diagnosis not present

## 2020-10-16 ENCOUNTER — Other Ambulatory Visit: Payer: Self-pay

## 2020-10-16 ENCOUNTER — Ambulatory Visit: Payer: Medicare HMO | Admitting: Cardiology

## 2020-10-16 ENCOUNTER — Encounter: Payer: Self-pay | Admitting: Cardiology

## 2020-10-16 VITALS — BP 136/78 | HR 57 | Ht 69.0 in | Wt 244.4 lb

## 2020-10-16 DIAGNOSIS — E7800 Pure hypercholesterolemia, unspecified: Secondary | ICD-10-CM

## 2020-10-16 DIAGNOSIS — I4821 Permanent atrial fibrillation: Secondary | ICD-10-CM

## 2020-10-16 DIAGNOSIS — I452 Bifascicular block: Secondary | ICD-10-CM

## 2020-10-16 DIAGNOSIS — I10 Essential (primary) hypertension: Secondary | ICD-10-CM | POA: Diagnosis not present

## 2020-10-16 MED ORDER — LOSARTAN POTASSIUM 50 MG PO TABS: 75.0000 mg | ORAL_TABLET | Freq: Every day | ORAL | 3 refills | Status: DC

## 2020-10-17 DIAGNOSIS — D1801 Hemangioma of skin and subcutaneous tissue: Secondary | ICD-10-CM | POA: Diagnosis not present

## 2020-10-17 DIAGNOSIS — Z85828 Personal history of other malignant neoplasm of skin: Secondary | ICD-10-CM | POA: Diagnosis not present

## 2020-10-24 DIAGNOSIS — M5416 Radiculopathy, lumbar region: Secondary | ICD-10-CM | POA: Diagnosis not present

## 2020-10-24 DIAGNOSIS — M48061 Spinal stenosis, lumbar region without neurogenic claudication: Secondary | ICD-10-CM | POA: Diagnosis not present

## 2020-10-24 DIAGNOSIS — G4733 Obstructive sleep apnea (adult) (pediatric): Secondary | ICD-10-CM | POA: Diagnosis not present

## 2020-10-29 DIAGNOSIS — G4733 Obstructive sleep apnea (adult) (pediatric): Secondary | ICD-10-CM | POA: Diagnosis not present

## 2020-12-05 DIAGNOSIS — Z20822 Contact with and (suspected) exposure to covid-19: Secondary | ICD-10-CM | POA: Diagnosis not present

## 2020-12-10 DIAGNOSIS — L299 Pruritus, unspecified: Secondary | ICD-10-CM | POA: Diagnosis not present

## 2020-12-10 DIAGNOSIS — I482 Chronic atrial fibrillation, unspecified: Secondary | ICD-10-CM | POA: Diagnosis not present

## 2020-12-10 DIAGNOSIS — I10 Essential (primary) hypertension: Secondary | ICD-10-CM | POA: Diagnosis not present

## 2020-12-10 DIAGNOSIS — G4733 Obstructive sleep apnea (adult) (pediatric): Secondary | ICD-10-CM | POA: Diagnosis not present

## 2020-12-12 DIAGNOSIS — H0288B Meibomian gland dysfunction left eye, upper and lower eyelids: Secondary | ICD-10-CM | POA: Diagnosis not present

## 2020-12-12 DIAGNOSIS — H0288A Meibomian gland dysfunction right eye, upper and lower eyelids: Secondary | ICD-10-CM | POA: Diagnosis not present

## 2020-12-12 DIAGNOSIS — H401131 Primary open-angle glaucoma, bilateral, mild stage: Secondary | ICD-10-CM | POA: Diagnosis not present

## 2020-12-12 DIAGNOSIS — Z9889 Other specified postprocedural states: Secondary | ICD-10-CM | POA: Diagnosis not present

## 2020-12-12 DIAGNOSIS — H16223 Keratoconjunctivitis sicca, not specified as Sjogren's, bilateral: Secondary | ICD-10-CM | POA: Diagnosis not present

## 2021-01-04 DIAGNOSIS — Z23 Encounter for immunization: Secondary | ICD-10-CM | POA: Diagnosis not present

## 2021-01-07 DIAGNOSIS — N400 Enlarged prostate without lower urinary tract symptoms: Secondary | ICD-10-CM | POA: Diagnosis not present

## 2021-01-07 DIAGNOSIS — Z8546 Personal history of malignant neoplasm of prostate: Secondary | ICD-10-CM | POA: Diagnosis not present

## 2021-01-07 DIAGNOSIS — Z87891 Personal history of nicotine dependence: Secondary | ICD-10-CM | POA: Diagnosis not present

## 2021-01-07 DIAGNOSIS — Z7901 Long term (current) use of anticoagulants: Secondary | ICD-10-CM | POA: Diagnosis not present

## 2021-01-07 DIAGNOSIS — I10 Essential (primary) hypertension: Secondary | ICD-10-CM | POA: Diagnosis not present

## 2021-01-07 DIAGNOSIS — Z8673 Personal history of transient ischemic attack (TIA), and cerebral infarction without residual deficits: Secondary | ICD-10-CM | POA: Diagnosis not present

## 2021-01-07 DIAGNOSIS — E785 Hyperlipidemia, unspecified: Secondary | ICD-10-CM | POA: Diagnosis not present

## 2021-01-07 DIAGNOSIS — D6869 Other thrombophilia: Secondary | ICD-10-CM | POA: Diagnosis not present

## 2021-01-07 DIAGNOSIS — I4891 Unspecified atrial fibrillation: Secondary | ICD-10-CM | POA: Diagnosis not present

## 2021-01-08 ENCOUNTER — Other Ambulatory Visit: Payer: Self-pay | Admitting: Cardiology

## 2021-01-08 NOTE — Telephone Encounter (Signed)
Prescription refill request for Xarelto received.  Last office visit:Caleb Barton 10/16/20 Weight:110.9 Age:27M Scr:0.830 mg/ 12/10/2020 CrCl:115.1

## 2021-01-14 ENCOUNTER — Other Ambulatory Visit: Payer: Self-pay

## 2021-01-14 ENCOUNTER — Telehealth: Payer: Self-pay | Admitting: Cardiology

## 2021-01-14 MED ORDER — RIVAROXABAN 20 MG PO TABS: ORAL_TABLET | ORAL | 3 refills | Status: DC

## 2021-01-14 NOTE — Telephone Encounter (Signed)
*  STAT* If patient is at the pharmacy, call can be transferred to refill team.   1. Which medications need to be refilled? (please list name of each medication and dose if known) rivaroxaban (XARELTO) 20 MG TABS tablet  2. Which pharmacy/location (including street and city if local pharmacy) is medication to be sent to? [facility]  Fax# [phone]  3. Do they need a 30 day or 90 day supply? 90 day supply  Pt would like for the Xarelto refill to go to [facility] moving forward

## 2021-01-15 MED ORDER — RIVAROXABAN 20 MG PO TABS: ORAL_TABLET | ORAL | 1 refills | Status: DC

## 2021-01-15 NOTE — Telephone Encounter (Signed)
Prescription refill request for Xarelto received. And filled to [facility] as requested Last office visit:jordan 10/16/20 Weight:110.9kg Age:26m Scr:0.830 mg/ 12/10/2020 CrCl:115.1

## 2021-01-18 ENCOUNTER — Telehealth: Payer: Self-pay

## 2021-01-18 NOTE — Telephone Encounter (Signed)
Spoke to patient Xarelto 20 mg 90 day prescription faxed to [facility] at fax # [phone].

## 2021-01-21 NOTE — Telephone Encounter (Signed)
Already spoke to patient see 9/30 telephone note.

## 2021-02-12 DIAGNOSIS — M25562 Pain in left knee: Secondary | ICD-10-CM | POA: Diagnosis not present

## 2021-02-18 DIAGNOSIS — J441 Chronic obstructive pulmonary disease with (acute) exacerbation: Secondary | ICD-10-CM | POA: Diagnosis not present

## 2021-02-18 DIAGNOSIS — N401 Enlarged prostate with lower urinary tract symptoms: Secondary | ICD-10-CM | POA: Diagnosis not present

## 2021-02-18 DIAGNOSIS — E785 Hyperlipidemia, unspecified: Secondary | ICD-10-CM | POA: Diagnosis not present

## 2021-02-18 DIAGNOSIS — I10 Essential (primary) hypertension: Secondary | ICD-10-CM | POA: Diagnosis not present

## 2021-02-18 DIAGNOSIS — I482 Chronic atrial fibrillation, unspecified: Secondary | ICD-10-CM | POA: Diagnosis not present

## 2021-02-26 DIAGNOSIS — M1712 Unilateral primary osteoarthritis, left knee: Secondary | ICD-10-CM | POA: Diagnosis not present

## 2021-03-05 DIAGNOSIS — M1712 Unilateral primary osteoarthritis, left knee: Secondary | ICD-10-CM | POA: Diagnosis not present

## 2021-03-12 DIAGNOSIS — M1712 Unilateral primary osteoarthritis, left knee: Secondary | ICD-10-CM | POA: Diagnosis not present

## 2021-03-18 DIAGNOSIS — M25662 Stiffness of left knee, not elsewhere classified: Secondary | ICD-10-CM | POA: Diagnosis not present

## 2021-03-18 DIAGNOSIS — M1712 Unilateral primary osteoarthritis, left knee: Secondary | ICD-10-CM | POA: Diagnosis not present

## 2021-03-18 DIAGNOSIS — M6281 Muscle weakness (generalized): Secondary | ICD-10-CM | POA: Diagnosis not present

## 2021-03-25 DIAGNOSIS — U071 COVID-19: Secondary | ICD-10-CM | POA: Diagnosis not present

## 2021-04-01 DIAGNOSIS — C61 Malignant neoplasm of prostate: Secondary | ICD-10-CM | POA: Diagnosis not present

## 2021-04-02 DIAGNOSIS — H16223 Keratoconjunctivitis sicca, not specified as Sjogren's, bilateral: Secondary | ICD-10-CM | POA: Diagnosis not present

## 2021-04-02 DIAGNOSIS — H401131 Primary open-angle glaucoma, bilateral, mild stage: Secondary | ICD-10-CM | POA: Diagnosis not present

## 2021-04-02 DIAGNOSIS — H0288B Meibomian gland dysfunction left eye, upper and lower eyelids: Secondary | ICD-10-CM | POA: Diagnosis not present

## 2021-04-02 DIAGNOSIS — H0288A Meibomian gland dysfunction right eye, upper and lower eyelids: Secondary | ICD-10-CM | POA: Diagnosis not present

## 2021-04-05 ENCOUNTER — Other Ambulatory Visit: Payer: Self-pay | Admitting: Family Medicine

## 2021-04-05 ENCOUNTER — Ambulatory Visit
Admission: RE | Admit: 2021-04-05 | Discharge: 2021-04-05 | Disposition: A | Discharge status: Home / Self Care | Payer: Medicare HMO | Source: Ambulatory Visit | Attending: Family Medicine | Admitting: Family Medicine

## 2021-04-05 ENCOUNTER — Other Ambulatory Visit: Payer: Self-pay

## 2021-04-05 DIAGNOSIS — R053 Chronic cough: Secondary | ICD-10-CM

## 2021-04-05 DIAGNOSIS — R0602 Shortness of breath: Secondary | ICD-10-CM | POA: Diagnosis not present

## 2021-04-08 DIAGNOSIS — Z8546 Personal history of malignant neoplasm of prostate: Secondary | ICD-10-CM | POA: Diagnosis not present

## 2021-04-08 DIAGNOSIS — N401 Enlarged prostate with lower urinary tract symptoms: Secondary | ICD-10-CM | POA: Diagnosis not present

## 2021-04-08 DIAGNOSIS — R3915 Urgency of urination: Secondary | ICD-10-CM | POA: Diagnosis not present

## 2021-04-10 DIAGNOSIS — G4733 Obstructive sleep apnea (adult) (pediatric): Secondary | ICD-10-CM | POA: Diagnosis not present

## 2021-04-11 DIAGNOSIS — E785 Hyperlipidemia, unspecified: Secondary | ICD-10-CM | POA: Diagnosis not present

## 2021-04-11 DIAGNOSIS — M1712 Unilateral primary osteoarthritis, left knee: Secondary | ICD-10-CM | POA: Diagnosis not present

## 2021-04-11 DIAGNOSIS — J441 Chronic obstructive pulmonary disease with (acute) exacerbation: Secondary | ICD-10-CM | POA: Diagnosis not present

## 2021-04-11 DIAGNOSIS — M25662 Stiffness of left knee, not elsewhere classified: Secondary | ICD-10-CM | POA: Diagnosis not present

## 2021-04-11 DIAGNOSIS — I10 Essential (primary) hypertension: Secondary | ICD-10-CM | POA: Diagnosis not present

## 2021-04-11 DIAGNOSIS — N401 Enlarged prostate with lower urinary tract symptoms: Secondary | ICD-10-CM | POA: Diagnosis not present

## 2021-04-11 DIAGNOSIS — M6281 Muscle weakness (generalized): Secondary | ICD-10-CM | POA: Diagnosis not present

## 2021-04-24 DIAGNOSIS — L570 Actinic keratosis: Secondary | ICD-10-CM | POA: Diagnosis not present

## 2021-04-24 DIAGNOSIS — L821 Other seborrheic keratosis: Secondary | ICD-10-CM | POA: Diagnosis not present

## 2021-04-24 DIAGNOSIS — Z85828 Personal history of other malignant neoplasm of skin: Secondary | ICD-10-CM | POA: Diagnosis not present

## 2021-04-24 DIAGNOSIS — D485 Neoplasm of uncertain behavior of skin: Secondary | ICD-10-CM | POA: Diagnosis not present

## 2021-04-24 DIAGNOSIS — L110 Acquired keratosis follicularis: Secondary | ICD-10-CM | POA: Diagnosis not present

## 2021-04-24 DIAGNOSIS — L723 Sebaceous cyst: Secondary | ICD-10-CM | POA: Diagnosis not present

## 2021-04-24 DIAGNOSIS — M6281 Muscle weakness (generalized): Secondary | ICD-10-CM | POA: Diagnosis not present

## 2021-04-24 DIAGNOSIS — M25662 Stiffness of left knee, not elsewhere classified: Secondary | ICD-10-CM | POA: Diagnosis not present

## 2021-04-24 DIAGNOSIS — M1712 Unilateral primary osteoarthritis, left knee: Secondary | ICD-10-CM | POA: Diagnosis not present

## 2021-04-24 DIAGNOSIS — L820 Inflamed seborrheic keratosis: Secondary | ICD-10-CM | POA: Diagnosis not present

## 2021-04-24 DIAGNOSIS — D224 Melanocytic nevi of scalp and neck: Secondary | ICD-10-CM | POA: Diagnosis not present

## 2021-04-29 DIAGNOSIS — M6281 Muscle weakness (generalized): Secondary | ICD-10-CM | POA: Diagnosis not present

## 2021-04-29 DIAGNOSIS — M25662 Stiffness of left knee, not elsewhere classified: Secondary | ICD-10-CM | POA: Diagnosis not present

## 2021-04-29 DIAGNOSIS — M1712 Unilateral primary osteoarthritis, left knee: Secondary | ICD-10-CM | POA: Diagnosis not present

## 2021-05-13 DIAGNOSIS — M1712 Unilateral primary osteoarthritis, left knee: Secondary | ICD-10-CM | POA: Diagnosis not present

## 2021-05-13 DIAGNOSIS — M25662 Stiffness of left knee, not elsewhere classified: Secondary | ICD-10-CM | POA: Diagnosis not present

## 2021-05-13 DIAGNOSIS — M6281 Muscle weakness (generalized): Secondary | ICD-10-CM | POA: Diagnosis not present

## 2021-05-14 ENCOUNTER — Telehealth: Payer: Self-pay | Admitting: Cardiology

## 2021-05-14 DIAGNOSIS — M1712 Unilateral primary osteoarthritis, left knee: Secondary | ICD-10-CM | POA: Diagnosis not present

## 2021-05-14 NOTE — Telephone Encounter (Signed)
° °  Pre-operative Risk Assessment    Patient Name: Caleb Barton  DOB: January 02, 1941 MRN: [id number]      Request for Surgical Clearance    Procedure:   left total knee replacement   Date of Surgery:  Clearance TBD                                 Surgeon:  Kathrynn Ducking MD Surgeon's Group or Practice Name:  [facility] Phone number:  [phone] - ext: [phone] for [name] Fax number:  [phone] (attn: [name])   Type of Clearance Requested:   - Medical  - Pharmacy:  Hold Rivaroxaban (Xarelto) - # days unspecified    Type of Anesthesia:   not specified    Additional requests/questions:   n/a  Lonia Chimera   05/14/2021, 4:12 PM

## 2021-05-15 ENCOUNTER — Encounter: Payer: Self-pay | Admitting: Cardiology

## 2021-05-15 NOTE — Telephone Encounter (Signed)
Patient with diagnosis of Afib on Xarelto for anticoagulation.    Procedure: Left Total Knee Replacement Date of procedure: TBD  CHA2DS2-VASc Score = 6   This indicates a 9.7% annual risk of stroke. The patient's score is based upon: CHF History: 0 HTN History: 1 Diabetes History: 0 Stroke History: 2 Vascular Disease History: 1 Age Score: 2 Gender Score: 0   CrCl 115 mL/min Platelet count 231K  Pt has a remote hx of thalamic CVA in 2006. Pt is at high risk off of anticoagulation given prior stroke. Previously cleared by MD to hold Xarelto for 2 days for a spinal procedure. Typically require 3 day Xarelto hold prior to TKA - will forward to MD to see if 3 day hold is acceptable.

## 2021-05-15 NOTE — Telephone Encounter (Signed)
° °  Name: Caleb Barton  DOB: [birth year]  MRN: [id number]   Primary Cardiologist: Peter Martinique, MD  Chart reviewed as part of pre-operative protocol coverage. Patient was contacted 05/15/2021 in reference to pre-operative risk assessment for pending surgery as outlined below.  Caleb Barton was last seen on 10/16/20 by Dr. Martinique.  Since that day, Caleb Barton has done well.  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 He just got back from climbing mountains in [place] - no angina.   Per our clinical pharmacist: Patient with diagnosis of Afib on Xarelto for anticoagulation.     Procedure: Left Total Knee Replacement Date of procedure: TBD   CHA2DS2-VASc Score = 6   This indicates a 9.7% annual risk of stroke. The patient's score is based upon: CHF History: 0 HTN History: 1 Diabetes History: 0 Stroke History: 2 Vascular Disease History: 1 Age Score: 2 Gender Score: 0   CrCl 115 mL/min Platelet count 231K   Pt has a remote hx of thalamic CVA in 2006. Pt is at high risk off of anticoagulation given prior stroke. Previously cleared by MD to hold Xarelto for 2 days for a spinal procedure. Typically require 3 day Xarelto hold prior to TKA - will forward to MD to see if 3 day hold is acceptable.  Per Dr. Martinique: OK to hold xarelto for three days.   Therefore, based on ACC/AHA guidelines, the patient would be at acceptable risk for the planned procedure without further cardiovascular testing.   The patient was advised that if he develops new symptoms prior to surgery to contact our office to arrange for a follow-up visit, and he verbalized understanding.  I will route this recommendation to the requesting party via Epic fax function and remove from pre-op pool. Please call with questions.  Tami Lin Javana Schey, PA 05/15/2021, 3:24 PM

## 2021-05-20 DIAGNOSIS — I10 Essential (primary) hypertension: Secondary | ICD-10-CM | POA: Diagnosis not present

## 2021-05-20 DIAGNOSIS — I482 Chronic atrial fibrillation, unspecified: Secondary | ICD-10-CM | POA: Diagnosis not present

## 2021-05-20 DIAGNOSIS — J441 Chronic obstructive pulmonary disease with (acute) exacerbation: Secondary | ICD-10-CM | POA: Diagnosis not present

## 2021-05-20 DIAGNOSIS — E785 Hyperlipidemia, unspecified: Secondary | ICD-10-CM | POA: Diagnosis not present

## 2021-05-20 DIAGNOSIS — N401 Enlarged prostate with lower urinary tract symptoms: Secondary | ICD-10-CM | POA: Diagnosis not present

## 2021-05-22 DIAGNOSIS — H0288B Meibomian gland dysfunction left eye, upper and lower eyelids: Secondary | ICD-10-CM | POA: Diagnosis not present

## 2021-05-22 DIAGNOSIS — H401131 Primary open-angle glaucoma, bilateral, mild stage: Secondary | ICD-10-CM | POA: Diagnosis not present

## 2021-05-22 DIAGNOSIS — H0288A Meibomian gland dysfunction right eye, upper and lower eyelids: Secondary | ICD-10-CM | POA: Diagnosis not present

## 2021-05-22 DIAGNOSIS — H16223 Keratoconjunctivitis sicca, not specified as Sjogren's, bilateral: Secondary | ICD-10-CM | POA: Diagnosis not present

## 2021-05-22 DIAGNOSIS — B309 Viral conjunctivitis, unspecified: Secondary | ICD-10-CM | POA: Diagnosis not present

## 2021-05-23 DIAGNOSIS — I482 Chronic atrial fibrillation, unspecified: Secondary | ICD-10-CM | POA: Diagnosis not present

## 2021-05-23 DIAGNOSIS — R052 Subacute cough: Secondary | ICD-10-CM | POA: Diagnosis not present

## 2021-05-23 DIAGNOSIS — Z01818 Encounter for other preprocedural examination: Secondary | ICD-10-CM | POA: Diagnosis not present

## 2021-05-23 DIAGNOSIS — M1712 Unilateral primary osteoarthritis, left knee: Secondary | ICD-10-CM | POA: Diagnosis not present

## 2021-05-31 DIAGNOSIS — C61 Malignant neoplasm of prostate: Secondary | ICD-10-CM | POA: Diagnosis not present

## 2021-06-07 DIAGNOSIS — N401 Enlarged prostate with lower urinary tract symptoms: Secondary | ICD-10-CM | POA: Diagnosis not present

## 2021-06-07 DIAGNOSIS — I10 Essential (primary) hypertension: Secondary | ICD-10-CM | POA: Diagnosis not present

## 2021-06-07 DIAGNOSIS — J441 Chronic obstructive pulmonary disease with (acute) exacerbation: Secondary | ICD-10-CM | POA: Diagnosis not present

## 2021-06-07 DIAGNOSIS — I482 Chronic atrial fibrillation, unspecified: Secondary | ICD-10-CM | POA: Diagnosis not present

## 2021-06-07 DIAGNOSIS — E785 Hyperlipidemia, unspecified: Secondary | ICD-10-CM | POA: Diagnosis not present

## 2021-06-10 DIAGNOSIS — M1712 Unilateral primary osteoarthritis, left knee: Secondary | ICD-10-CM | POA: Diagnosis not present

## 2021-06-10 NOTE — Progress Notes (Signed)
Surgery orders requested via Epic inbox. °

## 2021-06-13 ENCOUNTER — Ambulatory Visit: Payer: Self-pay | Admitting: Physician Assistant

## 2021-06-13 DIAGNOSIS — M1712 Unilateral primary osteoarthritis, left knee: Secondary | ICD-10-CM

## 2021-06-13 DIAGNOSIS — G8929 Other chronic pain: Secondary | ICD-10-CM

## 2021-06-13 NOTE — H&P (Signed)
TOTAL KNEE ADMISSION H&P  Patient is being admitted for left total knee arthroplasty.  Subjective:  Chief Complaint:left knee pain.  HPI: Caleb Barton, 81 y.o. male, has a history of pain and functional disability in the left knee due to arthritis and has failed non-surgical conservative treatments for greater than 12 weeks to includeNSAID's and/or analgesics, corticosteriod injections, viscosupplementation injections, and activity modification.  Onset of symptoms was gradual, starting 10 years ago with gradually worsening course since that time. The patient noted no past surgery on the left knee(s).  Patient currently rates pain in the left knee(s) at 8 out of 10 with activity. Patient has night pain, worsening of pain with activity and weight bearing, pain that interferes with activities of daily living, pain with passive range of motion, crepitus, and joint swelling.  Patient has evidence of periarticular osteophytes and joint space narrowing by imaging studies. There is no active infection.  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   Myringitis of left ear 05/30/2015   HTN (hypertension)    Dysrhythmia    Stroke [facility])    Sleep apnea    Right knee DJD    Past Medical History:  Diagnosis Date   Cancer ([place])    precancerous skin cells reomved from face   Dysrhythmia    RBBB   Hypercholesteremia    takes Lipitor daily and niacin daily   Hypertension    takes HCTZ daily and losartan daily   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    Sleep apnea    cpap;sleep study done at home;request report from dr.gates   Stroke [facility])    TIA 15 yrs ago     Past Surgical History:  Procedure Laterality Date   CARDIOVERSION N/A 10/14/2016   Procedure: CARDIOVERSION;  Surgeon: Larey Dresser, MD;  Location: [facility] ENDOSCOPY;  Service: Cardiovascular;  Laterality: N/A;   cataracts both eyes     ioc lens   COLONOSCOPY     with polyps   COLONOSCOPY  08/20/2011   Procedure: COLONOSCOPY;  Surgeon: Winfield Cunas., MD;  [facility] ENDOSCOPY;  Service: Endoscopy;  Laterality: N/A;   COLONOSCOPY WITH PROPOFOL N/A 12/17/2016   Procedure: COLONOSCOPY WITH PROPOFOL;  Surgeon: Laurence Spates, MD;  [facility] ENDOSCOPY;  Service: Endoscopy;  Laterality: N/A;   EYE SURGERY     laser bilateral eyes   KNEE ARTHROSCOPY  x 2 right knee   LEFT HEART CATH AND CORONARY ANGIOGRAPHY N/A 07/29/2019   Procedure: LEFT HEART CATH AND CORONARY ANGIOGRAPHY;  Surgeon: Wellington Hampshire, MD;  Location: [place] CV LAB;  Service: Cardiovascular;  Laterality: N/A;   LUMBAR LAMINECTOMY/DECOMPRESSION MICRODISCECTOMY Right 09/09/2012   Procedure: LUMBAR LAMINECTOMY/DECOMPRESSION MICRODISCECTOMY 1 LEVEL;  Surgeon: Eustace Moore, MD;  Location: [place] NEURO ORS;  Service: Neurosurgery;  Laterality: Right;  LUMBAR LAMINECTOMY/DECOMPRESSION MICRODISCECTOMY 1 LEVEL   TONSILLECTOMY  yrs ago   TOTAL KNEE ARTHROPLASTY  02/09/2012   Procedure: TOTAL KNEE ARTHROPLASTY;  Surgeon: Lorn Junes, MD;  Location: [place];  Service: Orthopedics;  Laterality: Right;    Current Outpatient Medications  Medication Sig Dispense  Refill Last Dose   amoxicillin (AMOXIL) 250 MG capsule Take 1,000 mg by mouth. 1 hour prior to dental appointments      atorvastatin (LIPITOR) 40 MG tablet Take 40 mg by mouth daily.      dorzolamide-timolol (COSOPT) 22.3-6.8 MG/ML ophthalmic solution Place 1 drop into both eyes 2 (two) times daily.       hydrochlorothiazide (HYDRODIURIL) 25 MG tablet Take 25 mg by mouth daily.      latanoprost (XALATAN) 0.005 % ophthalmic solution Place 1 drop into both eyes at bedtime.        losartan (COZAAR) 50 MG tablet Take 1.5 tablets (75 mg total) by mouth daily. 145 tablet 3    Multiple Vitamin (MULTIVITAMIN WITH MINERALS) TABS tablet Take 1 tablet by mouth daily.      Potassium 99 MG TABS Take 99 mg by mouth daily.      RESTASIS 0.05 % ophthalmic emulsion Place 1 drop into both eyes 2 (two) times daily.       rivaroxaban (XARELTO) 20 MG TABS tablet TAKE ONE TABLET BY MOUTH EVERY EVENING WITH DINNER 90 tablet 1    tamsulosin (FLOMAX) 0.4 MG CAPS capsule Take 0.4 mg by mouth daily.      No current facility-administered medications for this visit.   Allergies  Allergen Reactions   Lisinopril     COUGH    Other Rash   Sulfa Antibiotics Rash    Social History   Tobacco Use   Smoking status: Former   Smokeless tobacco: Never   Tobacco comments:    quit 35 yrs ago  Substance Use Topics   Alcohol use: Yes    Comment: social    Family History  Problem Relation Age of Onset   Stroke Mother    Breast cancer Mother    Bladder Cancer Brother    Breast cancer Other    Prostate cancer Neg Hx    Pancreatic cancer Neg Hx      Review of Systems  Cardiovascular:  Positive for leg swelling.  Genitourinary:  Positive for dysuria.  Musculoskeletal:  Positive for arthralgias.  All other systems reviewed and are negative.  Objective:  Physical Exam Constitutional:      General: He is not in acute distress.    Appearance: Normal appearance.  HENT:     Head: Normocephalic and atraumatic.  Eyes:     Extraocular Movements: Extraocular movements intact.     Pupils: Pupils are equal, round, and reactive to light.  Cardiovascular:     Rate and Rhythm: Normal rate and regular rhythm.     Pulses: Normal pulses.     Heart sounds: Normal heart sounds.  Pulmonary:     Effort: Pulmonary effort is normal. No respiratory distress.     Breath sounds: Normal breath sounds.  Abdominal:     General: Abdomen is flat. Bowel sounds are normal. There is no distension.      Palpations: Abdomen is soft.     Tenderness: There is no abdominal tenderness.  Musculoskeletal:     Cervical back: Normal range of motion and neck supple.     Comments: Examination of the left lower extremity again shows he is neurovascularly intact.  Intact dorsiflexion and plantarflexion with the ankle.  He does have some pitting edema in the lower extremities bilaterally.  Joint line tenderness with the left knee.  Good flexion and extension.  Stable to varus and valgus.  Positive patellofemoral crepitus.    Lymphadenopathy:  Cervical: No cervical adenopathy.  Skin:    General: Skin is warm and dry.     Findings: No erythema or rash.  Neurological:     General: No focal deficit present.     Mental Status: He is alert and oriented to person, place, and time.  Psychiatric:        Mood and Affect: Mood normal.        Behavior: Behavior normal.    Vital signs in last 24 hours: @VSRANGES @  Labs:   Estimated body mass index is 36.09 kg/m as calculated from the following:   Height as of 10/16/20: 5\' 9"  (1.753 m).   Weight as of 10/16/20: 110.9 kg.   Imaging Review Plain radiographs demonstrate moderate degenerative joint disease of the left knee(s). The overall alignment ismild varus. The bone quality appears to be good for age and reported activity level.      Assessment/Plan:  End stage arthritis, left knee   The patient history, physical examination, clinical judgment of the provider and imaging studies are consistent with end stage degenerative joint disease of the left knee(s) and total knee arthroplasty is deemed medically necessary. The treatment options including medical management, injection therapy arthroscopy and arthroplasty were discussed at length. The risks and benefits of total knee arthroplasty were presented and reviewed. The risks due to aseptic loosening, infection, stiffness, patella tracking problems, thromboembolic complications and other imponderables  were discussed. The patient acknowledged the explanation, agreed to proceed with the plan and consent was signed. Patient is being admitted for inpatient treatment for surgery, pain control, PT, OT, prophylactic antibiotics, VTE prophylaxis, progressive ambulation and ADL's and discharge planning. The patient is planning to be discharged home with home health services    Anticipated LOS equal to or greater than 2 midnights due to - Age 58 and older with one or more of the following:  - Obesity  - Expected need for hospital services (PT, OT, Nursing) required for safe  discharge  - Anticipated need for postoperative skilled nursing care or inpatient rehab  - Active co-morbidities: Cardiac Arrhythmia, TIA OR   - Unanticipated findings during/Post Surgery: None  - Patient is a high risk of re-admission due to: None

## 2021-06-13 NOTE — H&P (View-Only) (Signed)
TOTAL KNEE ADMISSION H&P  Patient is being admitted for left total knee arthroplasty.  Subjective:  Chief Complaint:left knee pain.  HPI: Caleb Barton, 81 y.o. male, has a history of pain and functional disability in the left knee due to arthritis and has failed non-surgical conservative treatments for greater than 12 weeks to includeNSAID's and/or analgesics, corticosteriod injections, viscosupplementation injections, and activity modification.  Onset of symptoms was gradual, starting 10 years ago with gradually worsening course since that time. The patient noted no past surgery on the left knee(s).  Patient currently rates pain in the left knee(s) at 8 out of 10 with activity. Patient has night pain, worsening of pain with activity and weight bearing, pain that interferes with activities of daily living, pain with passive range of motion, crepitus, and joint swelling.  Patient has evidence of periarticular osteophytes and joint space narrowing by imaging studies. There is no active infection.  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   Myringitis of left ear 05/30/2015   HTN (hypertension)    Dysrhythmia    Stroke [facility])    Sleep apnea    Right knee DJD    Past Medical History:  Diagnosis Date   Cancer ([place])    precancerous skin cells reomved from face   Dysrhythmia    RBBB   Hypercholesteremia    takes Lipitor daily and niacin daily   Hypertension    takes HCTZ daily and losartan daily   Prostate cancer ([place])    Right knee DJD    Sleep apnea    cpap;sleep study done at home;request report from dr.gates   Stroke [facility])    TIA 15 yrs ago     Past Surgical History:  Procedure Laterality Date   CARDIOVERSION N/A 10/14/2016   Procedure: CARDIOVERSION;  Surgeon: Larey Dresser, MD;  Location: [facility] ENDOSCOPY;  Service: Cardiovascular;  Laterality: N/A;   cataracts both eyes     ioc lens   COLONOSCOPY     with polyps   COLONOSCOPY  08/20/2011   Procedure: COLONOSCOPY;  Surgeon: Winfield Cunas., MD;  Location: [facility] ENDOSCOPY;  Service: Endoscopy;  Laterality: N/A;   COLONOSCOPY WITH PROPOFOL N/A 12/17/2016   Procedure: COLONOSCOPY WITH PROPOFOL;  Surgeon: Laurence Spates, MD;  Location: [facility] ENDOSCOPY;  Service: Endoscopy;  Laterality: N/A;   EYE SURGERY     laser bilateral eyes   KNEE ARTHROSCOPY  x 2 right knee   LEFT HEART CATH AND CORONARY ANGIOGRAPHY N/A 07/29/2019   Procedure: LEFT HEART CATH AND CORONARY ANGIOGRAPHY;  Surgeon: Wellington Hampshire, MD;  Location: [facility] CV LAB;  Service: Cardiovascular;  Laterality: N/A;   LUMBAR LAMINECTOMY/DECOMPRESSION MICRODISCECTOMY Right 09/09/2012   Procedure: LUMBAR LAMINECTOMY/DECOMPRESSION MICRODISCECTOMY 1 LEVEL;  Surgeon: Eustace Moore, MD;  Location: [facility] NEURO ORS;  Service: Neurosurgery;  Laterality: Right;  LUMBAR LAMINECTOMY/DECOMPRESSION MICRODISCECTOMY 1 LEVEL   TONSILLECTOMY  yrs ago   TOTAL KNEE ARTHROPLASTY  02/09/2012   Procedure: TOTAL KNEE ARTHROPLASTY;  Surgeon: Lorn Junes, MD;  Location: [facility];  Service: Orthopedics;  Laterality: Right;    Current Outpatient Medications  Medication Sig Dispense  Refill Last Dose   amoxicillin (AMOXIL) 250 MG capsule Take 1,000 mg by mouth. 1 hour prior to dental appointments      atorvastatin (LIPITOR) 40 MG tablet Take 40 mg by mouth daily.      dorzolamide-timolol (COSOPT) 22.3-6.8 MG/ML ophthalmic solution Place 1 drop into both eyes 2 (two) times daily.       hydrochlorothiazide (HYDRODIURIL) 25 MG tablet Take 25 mg by mouth daily.      latanoprost (XALATAN) 0.005 % ophthalmic solution Place 1 drop into both eyes at bedtime.        losartan (COZAAR) 50 MG tablet Take 1.5 tablets (75 mg total) by mouth daily. 145 tablet 3    Multiple Vitamin (MULTIVITAMIN WITH MINERALS) TABS tablet Take 1 tablet by mouth daily.      Potassium 99 MG TABS Take 99 mg by mouth daily.      RESTASIS 0.05 % ophthalmic emulsion Place 1 drop into both eyes 2 (two) times daily.       rivaroxaban (XARELTO) 20 MG TABS tablet TAKE ONE TABLET BY MOUTH EVERY EVENING WITH DINNER 90 tablet 1    tamsulosin (FLOMAX) 0.4 MG CAPS capsule Take 0.4 mg by mouth daily.      No current facility-administered medications for this visit.   Allergies  Allergen Reactions   Lisinopril     COUGH    Other Rash   Sulfa Antibiotics Rash    Social History   Tobacco Use   Smoking status: Former   Smokeless tobacco: Never   Tobacco comments:    quit 35 yrs ago  Substance Use Topics   Alcohol use: Yes    Comment: social    Family History  Problem Relation Age of Onset   Stroke Mother    Breast cancer Mother    Bladder Cancer Brother    Breast cancer Other    Prostate cancer Neg Hx    Pancreatic cancer Neg Hx      Review of Systems  Cardiovascular:  Positive for leg swelling.  Genitourinary:  Positive for dysuria.  Musculoskeletal:  Positive for arthralgias.  All other systems reviewed and are negative.  Objective:  Physical Exam Constitutional:      General: He is not in acute distress.    Appearance: Normal appearance.  HENT:     Head: Normocephalic and atraumatic.  Eyes:     Extraocular Movements: Extraocular movements intact.     Pupils: Pupils are equal, round, and reactive to light.  Cardiovascular:     Rate and Rhythm: Normal rate and regular rhythm.     Pulses: Normal pulses.     Heart sounds: Normal heart sounds.  Pulmonary:     Effort: Pulmonary effort is normal. No respiratory distress.     Breath sounds: Normal breath sounds.  Abdominal:     General: Abdomen is flat. Bowel sounds are normal. There is no distension.      Palpations: Abdomen is soft.     Tenderness: There is no abdominal tenderness.  Musculoskeletal:     Cervical back: Normal range of motion and neck supple.     Comments: Examination of the left lower extremity again shows he is neurovascularly intact.  Intact dorsiflexion and plantarflexion with the ankle.  He does have some pitting edema in the lower extremities bilaterally.  Joint line tenderness with the left knee.  Good flexion and extension.  Stable to varus and valgus.  Positive patellofemoral crepitus.    Lymphadenopathy:  Cervical: No cervical adenopathy.  Skin:    General: Skin is warm and dry.     Findings: No erythema or rash.  Neurological:     General: No focal deficit present.     Mental Status: He is alert and oriented to person, place, and time.  Psychiatric:        Mood and Affect: Mood normal.        Behavior: Behavior normal.    Vital signs in last 24 hours: @VSRANGES @  Labs:   Estimated body mass index is 36.09 kg/m as calculated from the following:   Height as of 10/16/20: 5\' 9"  (1.753 m).   Weight as of 10/16/20: 110.9 kg.   Imaging Review Plain radiographs demonstrate moderate degenerative joint disease of the left knee(s). The overall alignment ismild varus. The bone quality appears to be good for age and reported activity level.      Assessment/Plan:  End stage arthritis, left knee   The patient history, physical examination, clinical judgment of the provider and imaging studies are consistent with end stage degenerative joint disease of the left knee(s) and total knee arthroplasty is deemed medically necessary. The treatment options including medical management, injection therapy arthroscopy and arthroplasty were discussed at length. The risks and benefits of total knee arthroplasty were presented and reviewed. The risks due to aseptic loosening, infection, stiffness, patella tracking problems, thromboembolic complications and other imponderables  were discussed. The patient acknowledged the explanation, agreed to proceed with the plan and consent was signed. Patient is being admitted for inpatient treatment for surgery, pain control, PT, OT, prophylactic antibiotics, VTE prophylaxis, progressive ambulation and ADL's and discharge planning. The patient is planning to be discharged home with home health services    Anticipated LOS equal to or greater than 2 midnights due to - Age 48 and older with one or more of the following:  - Obesity  - Expected need for hospital services (PT, OT, Nursing) required for safe  discharge  - Anticipated need for postoperative skilled nursing care or inpatient rehab  - Active co-morbidities: Cardiac Arrhythmia, TIA OR   - Unanticipated findings during/Post Surgery: None  - Patient is a high risk of re-admission due to: None

## 2021-06-20 DIAGNOSIS — M5416 Radiculopathy, lumbar region: Secondary | ICD-10-CM | POA: Diagnosis not present

## 2021-06-20 NOTE — Patient Instructions (Signed)
DUE TO COVID-19 ONLY ONE VISITOR IS ALLOWED TO COME WITH YOU AND STAY IN THE WAITING ROOM ONLY DURING PRE OP AND PROCEDURE.   **NO VISITORS ARE ALLOWED IN THE SHORT STAY AREA OR RECOVERY ROOM!!**  IF YOU WILL BE ADMITTED INTO THE HOSPITAL YOU ARE ALLOWED ONLY TWO SUPPORT PEOPLE DURING VISITATION HOURS ONLY (7 AM -8PM)   The support person(s) must pass our screening, gel in and out, and wear a mask at all times, including in the patients room. Patients must also wear a mask when staff or their support person are in the room. Visitors GUEST BADGE MUST BE WORN VISIBLY  One adult visitor may remain with you overnight and MUST be in the room by 8 P.M.  No visitors under the age of 49. Any visitor under the age of 29 must be accompanied by an adult.    COVID SWAB TESTING MUST BE COMPLETED ON: 06/27/21     (*ARRIVE AT YOUR APPOINTMENT TIME STAFF IS NOT HERE BEFORE 8AM!!!*)    Site: [facility] [address] [place] Enter: Main Entrance have a seat in the waiting area to the right of main entrance (DO NOT [place]!!!!!) Dial: [phone] to alert staff you have arrived  You are not required to quarantine, however you are required to wear a well-fitted mask when you are out and around people not in your household.  Hand Hygiene often Do NOT share personal items Notify your provider if you are in close contact with someone who has COVID or you develop fever 100.4 or greater, new onset of sneezing, cough, sore throat, shortness of breath or body aches.  [place] [address], [address], must go inside of the hospital, NOT A DRIVE THRU!  (Must self quarantine after testing. Follow instructions on handout.)       Your procedure is scheduled on: 07/01/21   Report to [facility] Main Entrance    Report to admitting at : 8:00 AM   Call this number if you have problems the morning of surgery  [phone]   Do not eat food :After Midnight.   After Midnight you may have the following liquids until: 7:30 AM  DAY OF SURGERY  Water Black Coffee (sugar ok, NO MILK/CREAM OR CREAMERS)  Tea (sugar ok, NO MILK/CREAM OR CREAMERS) regular and decaf                             Plain Jell-O (NO RED)                                           Fruit ices (not with fruit pulp, NO RED)                                     Popsicles (NO RED)                                                                  Juice:  apple, WHITE grape, WHITE cranberry Sports drinks like Gatorade (NO RED) Clear broth(vegetable,chicken,beef)       Drink Ensure drink AT :7:30 AM the day of surgery.     The day of surgery:  Drink ONE (1) Pre-Surgery Clear Ensure or G2 at AM the morning of surgery. Drink in one sitting. Do not sip.  This drink was given to you during your hospital  pre-op appointment visit. Nothing else to drink after completing the  Pre-Surgery Clear Ensure or G2.          If you have questions, please contact your surgeons office.  FOLLOW BOWEL PREP AND ANY ADDITIONAL PRE OP INSTRUCTIONS YOU RECEIVED FROM YOUR SURGEON'S OFFICE!!!    Oral Hygiene is also important to reduce your risk of infection.                                    Remember - BRUSH YOUR TEETH THE MORNING OF SURGERY WITH YOUR REGULAR TOOTHPASTE   Do NOT smoke after Midnight   Take these medicines the morning of surgery with A SIP OF WATER: tamsulosin.Use eye drops as usual.  DO NOT TAKE ANY ORAL DIABETIC MEDICATIONS DAY OF YOUR SURGERY  Bring CPAP mask and tubing day of surgery.                              You may not have any metal on your body including hair pins, jewelry, and body piercing             Do not wear  lotions, powders, perfumes/cologne, or deodorant              Men may shave face and neck.   Do not bring valuables to the hospital. [place].   Contacts, dentures or bridgework may not be worn into surgery.   Bring small overnight bag day of surgery.    Patients discharged on the day of surgery will not be allowed to drive home.  Someone NEEDS to stay with you for the first 24 hours after anesthesia.   Special Instructions: Bring a copy of your healthcare power of attorney and living will documents         the day of surgery if you haven't scanned them before.              Please read over the following fact sheets you were given: IF YOU HAVE QUESTIONS ABOUT YOUR PRE-OP INSTRUCTIONS PLEASE CALL [phone] These are anesthesia recommendations for holding your anticoagulants.  Please contact your prescribing physician to confirm IF it is safe to hold your anticoagulants for this length of time.   Eliquis Apixaban   3 DAYS  Xarelto Rivaroxaban   3 DAYS  Plavix Clopidogrel   5 DAYS  Pletal Cilostazol   5 DAYS       [facility] - Preparing for Surgery Before surgery, you can play an important role.  Because skin is not sterile, your skin needs to be as free of germs as possible.  You can reduce the number of germs on your skin by washing with CHG (chlorahexidine gluconate) soap before surgery.  CHG is an antiseptic cleaner which kills germs and bonds with the skin to continue killing germs even  after washing. Please DO NOT use if you have an allergy to CHG or antibacterial soaps.  If your skin becomes reddened/irritated stop using the CHG and inform your nurse when you arrive at Short Stay. Do not shave (including legs and underarms) for at least 48 hours prior to the first CHG shower.  You may shave your face/neck. Please follow these instructions carefully:  1.  Shower with CHG Soap the night before surgery and the  morning of Surgery.  2.  If you choose to wash your hair, wash your hair first as usual with your  normal  shampoo.  3.  After you shampoo, rinse your hair and body thoroughly to remove the  shampoo.                            4.  Use CHG as you would any other liquid soap.  You can apply chg directly  to the skin and wash                       Gently with a scrungie or clean washcloth.  5.  Apply the CHG Soap to your body ONLY FROM THE NECK DOWN.   Do not use on face/ open                           Wound or open sores. Avoid contact with eyes, ears mouth and genitals (private parts).                       Wash face,  Genitals (private parts) with your normal soap.             6.  Wash thoroughly, paying special attention to the area where your surgery  will be performed.  7.  Thoroughly rinse your body with warm water from the neck down.  8.  DO NOT shower/wash with your normal soap after using and rinsing off  the CHG Soap.                9.  Pat yourself dry with a clean towel.            10.  Wear clean pajamas.            11.  Place clean sheets on your bed the night of your first shower and do not  sleep with pets. Day of Surgery : Do not apply any lotions/deodorants the morning of surgery.  Please wear clean clothes to the hospital/surgery center.  FAILURE TO FOLLOW THESE INSTRUCTIONS MAY RESULT IN THE CANCELLATION OF YOUR SURGERY PATIENT SIGNATURE_________________________________  NURSE SIGNATURE__________________________________  ________________________________________________________________________   Caleb Barton  An incentive spirometer is a tool that can help keep your lungs clear and active. This tool measures how well you are filling your lungs with each breath. Taking long deep breaths may help reverse or decrease the chance of developing breathing (pulmonary) problems (especially infection) following: A long period of time when you are unable to move or be active. BEFORE THE PROCEDURE  If the spirometer includes an indicator to show your best effort, your nurse or respiratory therapist will set it to a desired goal. If possible, sit up straight or lean slightly  forward. Try not to slouch. Hold the incentive spirometer in an upright position. INSTRUCTIONS FOR USE  Sit on the edge of your bed if possible,  or sit up as far as you can in bed or on a chair. Hold the incentive spirometer in an upright position. Breathe out normally. Place the mouthpiece in your mouth and seal your lips tightly around it. Breathe in slowly and as deeply as possible, raising the piston or the ball toward the top of the column. Hold your breath for 3-5 seconds or for as long as possible. Allow the piston or ball to fall to the bottom of the column. Remove the mouthpiece from your mouth and breathe out normally. Rest for a few seconds and repeat Steps 1 through 7 at least 10 times every 1-2 hours when you are awake. Take your time and take a few normal breaths between deep breaths. The spirometer may include an indicator to show your best effort. Use the indicator as a goal to work toward during each repetition. After each set of 10 deep breaths, practice coughing to be sure your lungs are clear. If you have an incision (the cut made at the time of surgery), support your incision when coughing by placing a pillow or rolled up towels firmly against it. Once you are able to get out of bed, walk around indoors and cough well. You may stop using the incentive spirometer when instructed by your caregiver.  RISKS AND COMPLICATIONS Take your time so you do not get dizzy or light-headed. If you are in pain, you may need to take or ask for pain medication before doing incentive spirometry. It is harder to take a deep breath if you are having pain. AFTER USE Rest and breathe slowly and easily. It can be helpful to keep track of a log of your progress. Your caregiver can provide you with a simple table to help with this. If you are using the spirometer at home, follow these instructions: [place] IF:  You are having difficultly using the spirometer. You have trouble using the  spirometer as often as instructed. Your pain medication is not giving enough relief while using the spirometer. You develop fever of 100.5 F (38.1 C) or higher. SEEK IMMEDIATE MEDICAL CARE IF:  You cough up bloody sputum that had not been present before. You develop fever of 102 F (38.9 C) or greater. You develop worsening pain at or near the incision site. MAKE SURE YOU:  Understand these instructions. Will watch your condition. Will get help right away if you are not doing well or get worse. Document Released: 08/18/2006 Document Revised: 06/30/2011 Document Reviewed: 10/19/2006 [employer] Patient Information 2014 [place].   ________________________________________________________________________   Incentive Spirometer  An incentive spirometer is a tool that can help keep your lungs clear and active. This tool measures how well you are filling your lungs with each breath. Taking long deep breaths may help reverse or decrease the chance of developing breathing (pulmonary) problems (especially infection) following: A long period of time when you are unable to move or be active. BEFORE THE PROCEDURE  If the spirometer includes an indicator to show your best effort, your nurse or respiratory therapist will set it to a desired goal. If possible, sit up straight or lean slightly forward. Try not to slouch. Hold the incentive spirometer in an upright position. INSTRUCTIONS FOR USE  Sit on the edge of your bed if possible, or sit up as far as you can in bed or on a chair. Hold the incentive spirometer in an upright position. Breathe out normally. Place the mouthpiece in your mouth and seal your  lips tightly around it. Breathe in slowly and as deeply as possible, raising the piston or the ball toward the top of the column. Hold your breath for 3-5 seconds or for as long as possible. Allow the piston or ball to fall to the bottom of the column. Remove the mouthpiece from your mouth  and breathe out normally. Rest for a few seconds and repeat Steps 1 through 7 at least 10 times every 1-2 hours when you are awake. Take your time and take a few normal breaths between deep breaths. The spirometer may include an indicator to show your best effort. Use the indicator as a goal to work toward during each repetition. After each set of 10 deep breaths, practice coughing to be sure your lungs are clear. If you have an incision (the cut made at the time of surgery), support your incision when coughing by placing a pillow or rolled up towels firmly against it. Once you are able to get out of bed, walk around indoors and cough well. You may stop using the incentive spirometer when instructed by your caregiver.  RISKS AND COMPLICATIONS Take your time so you do not get dizzy or light-headed. If you are in pain, you may need to take or ask for pain medication before doing incentive spirometry. It is harder to take a deep breath if you are having pain. AFTER USE Rest and breathe slowly and easily. It can be helpful to keep track of a log of your progress. Your caregiver can provide you with a simple table to help with this. If you are using the spirometer at home, follow these instructions: [place] IF:  You are having difficultly using the spirometer. You have trouble using the spirometer as often as instructed. Your pain medication is not giving enough relief while using the spirometer. You develop fever of 100.5 F (38.1 C) or higher. SEEK IMMEDIATE MEDICAL CARE IF:  You cough up bloody sputum that had not been present before. You develop fever of 102 F (38.9 C) or greater. You develop worsening pain at or near the incision site. MAKE SURE YOU:  Understand these instructions. Will watch your condition. Will get help right away if you are not doing well or get worse. Document Released: 08/18/2006 Document Revised: 06/30/2011 Document Reviewed: 10/19/2006 [facility] Patient  Information 2014 [place], [place].   ________________________________________________________________________

## 2021-06-21 DIAGNOSIS — Z008 Encounter for other general examination: Secondary | ICD-10-CM | POA: Diagnosis not present

## 2021-06-21 DIAGNOSIS — J449 Chronic obstructive pulmonary disease, unspecified: Secondary | ICD-10-CM | POA: Diagnosis not present

## 2021-06-21 DIAGNOSIS — I739 Peripheral vascular disease, unspecified: Secondary | ICD-10-CM | POA: Diagnosis not present

## 2021-06-21 DIAGNOSIS — I4891 Unspecified atrial fibrillation: Secondary | ICD-10-CM | POA: Diagnosis not present

## 2021-06-21 DIAGNOSIS — D6869 Other thrombophilia: Secondary | ICD-10-CM | POA: Diagnosis not present

## 2021-06-21 DIAGNOSIS — I2510 Atherosclerotic heart disease of native coronary artery without angina pectoris: Secondary | ICD-10-CM | POA: Diagnosis not present

## 2021-06-21 DIAGNOSIS — M1990 Unspecified osteoarthritis, unspecified site: Secondary | ICD-10-CM | POA: Diagnosis not present

## 2021-06-21 DIAGNOSIS — J309 Allergic rhinitis, unspecified: Secondary | ICD-10-CM | POA: Diagnosis not present

## 2021-06-21 DIAGNOSIS — E785 Hyperlipidemia, unspecified: Secondary | ICD-10-CM | POA: Diagnosis not present

## 2021-06-21 DIAGNOSIS — G4733 Obstructive sleep apnea (adult) (pediatric): Secondary | ICD-10-CM | POA: Diagnosis not present

## 2021-06-21 DIAGNOSIS — I10 Essential (primary) hypertension: Secondary | ICD-10-CM | POA: Diagnosis not present

## 2021-06-21 DIAGNOSIS — H409 Unspecified glaucoma: Secondary | ICD-10-CM | POA: Diagnosis not present

## 2021-06-24 ENCOUNTER — Encounter (HOSPITAL_COMMUNITY): Payer: Self-pay

## 2021-06-24 ENCOUNTER — Other Ambulatory Visit: Payer: Self-pay

## 2021-06-24 ENCOUNTER — Ambulatory Visit: Payer: Self-pay | Admitting: Physician Assistant

## 2021-06-24 ENCOUNTER — Encounter (HOSPITAL_COMMUNITY)
Admission: RE | Admit: 2021-06-24 | Discharge: 2021-06-24 | Disposition: A | Discharge status: Home / Self Care | Payer: Medicare HMO | Source: Ambulatory Visit | Attending: Orthopedic Surgery | Admitting: Orthopedic Surgery

## 2021-06-24 VITALS — BP 143/95 | HR 70 | Temp 97.6°F | Ht 69.0 in | Wt 241.0 lb

## 2021-06-24 DIAGNOSIS — Z9989 Dependence on other enabling machines and devices: Secondary | ICD-10-CM | POA: Diagnosis not present

## 2021-06-24 DIAGNOSIS — G4730 Sleep apnea, unspecified: Secondary | ICD-10-CM | POA: Insufficient documentation

## 2021-06-24 DIAGNOSIS — G8929 Other chronic pain: Secondary | ICD-10-CM | POA: Insufficient documentation

## 2021-06-24 DIAGNOSIS — M25561 Pain in right knee: Secondary | ICD-10-CM

## 2021-06-24 DIAGNOSIS — Z8673 Personal history of transient ischemic attack (TIA), and cerebral infarction without residual deficits: Secondary | ICD-10-CM | POA: Diagnosis not present

## 2021-06-24 DIAGNOSIS — I10 Essential (primary) hypertension: Secondary | ICD-10-CM | POA: Insufficient documentation

## 2021-06-24 DIAGNOSIS — M1712 Unilateral primary osteoarthritis, left knee: Secondary | ICD-10-CM | POA: Insufficient documentation

## 2021-06-24 DIAGNOSIS — Z01812 Encounter for preprocedural laboratory examination: Secondary | ICD-10-CM | POA: Diagnosis present

## 2021-06-24 DIAGNOSIS — Z87891 Personal history of nicotine dependence: Secondary | ICD-10-CM | POA: Insufficient documentation

## 2021-06-24 DIAGNOSIS — Z01818 Encounter for other preprocedural examination: Secondary | ICD-10-CM

## 2021-06-24 HISTORY — DX: Dyspnea, unspecified: R06.00

## 2021-06-24 LAB — CBC WITH DIFFERENTIAL/PLATELET
Abs Immature Granulocytes: 0.01 10*3/uL (ref 0.00–0.07)
Basophils Absolute: 0 10*3/uL (ref 0.0–0.1)
Basophils Relative: 1 %
Eosinophils Absolute: 0.4 10*3/uL (ref 0.0–0.5)
Eosinophils Relative: 8 %
HCT: 45.2 % (ref 39.0–52.0)
Hemoglobin: 15.1 g/dL (ref 13.0–17.0)
Immature Granulocytes: 0 %
Lymphocytes Relative: 24 %
Lymphs Abs: 1.3 10*3/uL (ref 0.7–4.0)
MCH: 31.5 pg (ref 26.0–34.0)
MCHC: 33.4 g/dL (ref 30.0–36.0)
MCV: 94.4 fL (ref 80.0–100.0)
Monocytes Absolute: 0.6 10*3/uL (ref 0.1–1.0)
Monocytes Relative: 10 %
Neutro Abs: 3.1 10*3/uL (ref 1.7–7.7)
Neutrophils Relative %: 57 %
Platelets: 196 10*3/uL (ref 150–400)
RBC: 4.79 MIL/uL (ref 4.22–5.81)
RDW: 13.1 % (ref 11.5–15.5)
WBC: 5.4 10*3/uL (ref 4.0–10.5)
nRBC: 0 % (ref 0.0–0.2)

## 2021-06-24 LAB — COMPREHENSIVE METABOLIC PANEL
ALT: 29 U/L (ref 0–44)
AST: 27 U/L (ref 15–41)
Albumin: 4.5 g/dL (ref 3.5–5.0)
Alkaline Phosphatase: 75 U/L (ref 38–126)
Anion gap: 3 — ABNORMAL LOW (ref 5–15)
BUN: 14 mg/dL (ref 8–23)
CO2: 29 mmol/L (ref 22–32)
Calcium: 9 mg/dL (ref 8.9–10.3)
Chloride: 104 mmol/L (ref 98–111)
Creatinine, Ser: 0.85 mg/dL (ref 0.61–1.24)
GFR, Estimated: >60 mL/min (ref >60)
Glucose, Bld: 95 mg/dL (ref 70–99)
Potassium: 3.5 mmol/L (ref 3.5–5.1)
Sodium: 136 mmol/L (ref 135–145)
Total Bilirubin: 1.3 mg/dL — ABNORMAL HIGH (ref 0.3–1.2)
Total Protein: 6.8 g/dL (ref 6.5–8.1)

## 2021-06-24 LAB — SURGICAL PCR SCREEN
MRSA, PCR: NEGATIVE
Staphylococcus aureus: NEGATIVE

## 2021-06-24 NOTE — Care Plan (Signed)
Ortho Bundle Case Management Note ? ?Patient Details  ?Name: Caleb Barton ?MRN: [id number] ?Date of Birth: [birth year] ? ?  Met with patient in the office prior to surgery. He will discharge to home with family to assist. Has equipment at home. HHPT referral to [place] and [place] set up with [place]. Patient and MD in agreement with plan. Choice offered               ? ? ? ?DME Arranged:    ?DME Agency:    ? ?HH Arranged:  PT ?[place] Agency:  [place] ? ?Additional Comments: ?Please contact me with any questions of if this plan should need to change. ? ?Mardelle Matte  [facility] Orthopaedic Specialist  [phone] ?06/24/2021, 9:31 AM ?  ?

## 2021-06-24 NOTE — Progress Notes (Signed)
For Short Stay:  ?[facility] appointment date:06/27/21 @ 8:15 AM ?Date of COVID positive in last 90 days: N/A ?COVID Vaccine: pfizer x 2. Moderna x 3 ?Bowel Prep reminder: N/A ? ? ?For Anesthesia: ?PCP - Dr. Sela Hilding. ?Cardiologist - Dr. Martinique Peter. ? ?Chest x-ray - 04/06/21 ?EKG -  ?Stress Test -  ?ECHO - 06/30/19 ?Cardiac Cath - 07/29/19 ?Pacemaker/ICD device last checked: ?Pacemaker orders received: ?Device Rep notified: ? ?Spinal Cord Stimulator: ? ?Sleep Study - Yes ?CPAP - Yes ? ?Fasting Blood Sugar -  ?Checks Blood Sugar _____ times a day ?Date and result of last Hgb A1c- ? ?Blood Thinner Instructions: Xarelto will be held 3 days before surgery as per Benjaman Lobe Duke: PA.Pt. is aware. ?Aspirin Instructions: ?Last Dose: ? ?Activity level: Can go up a flight of stairs and activities of daily living without stopping and without chest pain and/or shortness of breath ?  Able to exercise without chest pain and/or shortness of breath ?  Unable to go up a flight of stairs without chest pain and/or shortness of breath ?   ? ?Anesthesia review: Hx: HTN,Afib,Rt. BBB,Stroke,OSA(CPAP) ? ?Patient denies shortness of breath, fever, cough and chest pain at PAT appointment ? ? ?Patient verbalized understanding of instructions that were given to them at the PAT appointment. Patient was also instructed that they will need to review over the PAT instructions again at home before surgery.  ?

## 2021-06-25 NOTE — Anesthesia Preprocedure Evaluation (Addendum)
Anesthesia Evaluation  ?Patient identified by MRN, date of birth, ID band ?Patient awake ? ? ? ?Reviewed: ?Allergy & Precautions, NPO status , Patient's Chart, lab work & pertinent test results ? ?Airway ?Mallampati: III ? ?TM Distance: >3 FB ?Neck ROM: Full ? ? ? Dental ? ?(+) Chipped,  ?  ?Pulmonary ?sleep apnea and Continuous Positive Airway Pressure Ventilation , former smoker,  ?  ?Pulmonary exam normal ?breath sounds clear to auscultation ? ? ? ? ? ? Cardiovascular ?hypertension, Pt. on medications ?Normal cardiovascular exam+ dysrhythmias Atrial Fibrillation  ?Rhythm:Regular Rate:Normal ? ?ECG: a-fib, rate 54 ?  ?Neuro/Psych ?TIAnegative psych ROS  ? GI/Hepatic ?negative GI ROS, Neg liver ROS,   ?Endo/Other  ?negative endocrine ROS ? Renal/GU ?negative Renal ROS  ? ?  ?Musculoskeletal ? ?(+) Arthritis ,  ? Abdominal ?(+) + obese,   ?Peds ? Hematology ? ?(+) Blood dyscrasia, ,   ?Anesthesia Other Findings ?OA LEFT KNEE ? Reproductive/Obstetrics ? ?  ? ? ? ? ? ? ? ? ? ? ? ? ? ?  ?  ? ? ? ? ? ? ?Anesthesia Physical ?Anesthesia Plan ? ?ASA: 3 ? ?Anesthesia Plan: General and Regional  ? ?Post-op Pain Management:   ? ?Induction: Intravenous ? ?PONV Risk Score and Plan: 2 and Ondansetron, Dexamethasone and Treatment may vary due to age or medical condition ? ?Airway Management Planned: LMA ? ?Additional Equipment:  ? ?Intra-op Plan:  ? ?Post-operative Plan: Extubation in OR ? ?Informed Consent: I have reviewed the patients History and Physical, chart, labs and discussed the procedure including the risks, benefits and alternatives for the proposed anesthesia with the patient or authorized representative who has indicated his/her understanding and acceptance.  ? ? ? ?Dental advisory given ? ?Plan Discussed with: CRNA ? ?Anesthesia Plan Comments: (Reviewed PAT note 06/24/21, Konrad Felix Ward, PA-C)  ? ? ? ? ? ?Anesthesia Quick Evaluation ? ?

## 2021-06-25 NOTE — Progress Notes (Signed)
Anesthesia Chart Review ? ? Case: [id number] Date/Time: 07/01/21 1030  ? Procedure: TOTAL KNEE ARTHROPLASTY (Left: Knee)  ? Anesthesia type: Choice  ? Pre-op diagnosis: OA LEFT KNEE  ? Location: [facility] ROOM 08 / [facility]  ? Surgeons: Willaim Sheng, MD  ? ?  ? ? ?DISCUSSION:81 y.o. former smoker with h/o HTN, sleep apnea, Stroke, prostate cancer, left knee OA scheduled for above procedure 07/01/2021 with Dr. Charlies Constable.  ? ?Per cardiology preoperative evaluation 05/15/2021, "Chart reviewed as part of pre-operative protocol coverage. Patient was contacted 05/15/2021 in reference to pre-operative risk assessment for pending surgery as outlined below.  Caleb Barton was last seen on 10/16/20 by Dr. Martinique.  Since that day, Caleb Barton has done well.  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 He just got back from climbing mountains in [place] - no angina.  ?  ?Pt has a remote hx of thalamic CVA in 2006. Pt is at high risk off of anticoagulation given prior stroke. Previously cleared by MD to hold Xarelto for 2 days for a spinal procedure. Typically require 3 day Xarelto hold prior to TKA - will forward to MD to see if 3 day hold is acceptable. ?  ?Per Dr. Martinique: ?OK to hold xarelto for three days.  ?  ?Therefore, based on ACC/AHA guidelines, the patient would be at acceptable risk for the planned procedure without further cardiovascular testing. " ? ?PAT nurse confirmed with patient that he was advised to hold Eliquis 3 days prior to surgery.  ? ?Anticipate pt can proceed with planned procedure barring acute status change.   ?VS: BP (!) 143/95   Pulse 70   Temp 36.4 ?C (Oral)   Ht '5\' 9"'$  (1.753 m)   Wt 109.3 kg   SpO2 100%   BMI 35.59 kg/m?  ? ?PROVIDERS: ?Glenis Smoker, MD is PCP  ? ?Martinique, Peter, MD is Cardiologist  ?LABS: Labs reviewed: Acceptable for surgery. ?(all labs ordered are listed, but only abnormal results are displayed) ? ?Labs Reviewed  ?COMPREHENSIVE METABOLIC PANEL -  Abnormal; Notable for the following components:  ?    Result Value  ? Total Bilirubin 1.3 (*)   ? Anion gap 3 (*)   ? All other components within normal limits  ?SURGICAL PCR SCREEN  ?CBC WITH DIFFERENTIAL/PLATELET  ?TYPE AND SCREEN  ? ? ? ?IMAGES: ? ? ?EKG: ?10/16/2020 ?Rate 54 bpm  ?Atrial fibrillation with slow ventricular response ?LAD/LAFB ?RBBB ? ?CV: ?Cardiac Cath 07/29/2019 ?Mid RCA lesion is 30% stenosed. ?Ost RCA lesion is 20% stenosed. ?Ost LAD lesion is 20% stenosed. ?Ramus lesion is 60% stenosed. ?  ?1.  Mild nonobstructive coronary artery disease.  Borderline stenosis in ostial ramus. ?2.  Left ventricular angiography was not performed.  EF was normal by echo. ?3.  Mildly elevated left ventricular end-diastolic pressure at 15 mmHg. ?  ?Recommendations: ?Suspect false positive nuclear stress test. ?Continue medical therapy. ? ?Myocardial Perfusion 07/19/2019 ?The left ventricular ejection fraction is mildly decreased (45-54%). ?Nuclear stress EF: 53%. ?There was no ST segment deviation noted during stress. ?Defect 1: There is a large defect of moderate severity present in the basal anteroseptal, basal inferoseptal, basal inferior, basal inferolateral, mid inferoseptal, mid inferior and apical inferior location. ?This is an intermediate risk study. ?No prior study for comparison. ?  ?EF low normal without specific wall motion abnormalities seen, but there is a large fixed defect in the inferior/inferoseptal walls from base to apex, extending  to anteroseptal/inferolateral areas at the base. This does not change significantly with stress imaging. There is no clear diaphragmatic attenuation to explain findings. There are notes re: abnormal myoview with normal cath in 2007, but I cannot see this study to compare. Intermediate risk given size of area, but no ischemia seen. ? ?Echo 06/30/19 ?1. Left ventricular ejection fraction, by estimation, is 55 to 60%. The  ?left ventricle has normal function. The left  ventricle has no regional  ?wall motion abnormalities. There is indeterminate left ventricular  ?hypertrophy. Left ventricular diastolic  ?parameters are indeterminate.  ? 2. Right ventricular systolic function is normal. The right ventricular  ?size is normal. Tricuspid regurgitation signal is inadequate for assessing  ?PA pressure.  ? 3. Left atrial size was moderately dilated.  ? 4. Right atrial size was moderately dilated.  ? 5. The mitral valve is normal in structure. No evidence of mitral valve  ?regurgitation. No evidence of mitral stenosis.  ? 6. The aortic valve was not well visualized. Aortic valve regurgitation  ?is not visualized. No aortic stenosis is present.  ? 7. Aortic dilatation noted. 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  ? 8. The inferior vena cava is normal in size with greater than 50%  ?respiratory variability, suggesting right atrial pressure of 3 mmHg. ?Past Medical History:  ?Diagnosis Date  ? Cancer [facility])   ? precancerous skin cells reomved from face  ? Dyspnea   ? Dysrhythmia   ? RBBB  ? Hypercholesteremia   ? takes Lipitor daily and niacin daily  ? Hypertension   ? takes HCTZ daily and losartan daily  ? Prostate cancer ([place])   ? Right knee DJD   ? Sleep apnea   ? cpap;sleep study done at home;request report from dr.gates  ? Stroke [facility])   ? TIA 15 yrs ago  ? ? ?Past Surgical History:  ?Procedure Laterality Date  ? CARDIOVERSION N/A 10/14/2016  ? Procedure: CARDIOVERSION;  Surgeon: Larey Dresser, MD;  [facility] ENDOSCOPY;  Service: Cardiovascular;  Laterality: N/A;  ? cataracts both eyes    ? ioc lens  ? COLONOSCOPY    ? with polyps  ? COLONOSCOPY  08/20/2011  ? Procedure: COLONOSCOPY;  Surgeon: Winfield Cunas., MD;  Location: [facility] ENDOSCOPY;  Service: Endoscopy;  Laterality: N/A;  ? COLONOSCOPY WITH PROPOFOL N/A 12/17/2016  ? Procedure: COLONOSCOPY WITH PROPOFOL;  Surgeon: Laurence Spates, MD;  Location: [facility]  ENDOSCOPY;  Service: Endoscopy;  Laterality: N/A;  ? EYE SURGERY    ? laser bilateral eyes  ? KNEE ARTHROSCOPY  x 2 right knee  ? LEFT HEART CATH AND CORONARY ANGIOGRAPHY N/A 07/29/2019  ? Procedure: LEFT HEART CATH AND CORONARY ANGIOGRAPHY;  Surgeon: Wellington Hampshire, MD;  Location: [place] CV LAB;  Service: Cardiovascular;  Laterality: N/A;  ? LUMBAR LAMINECTOMY/DECOMPRESSION MICRODISCECTOMY Right 09/09/2012  ? Procedure: LUMBAR LAMINECTOMY/DECOMPRESSION MICRODISCECTOMY 1 LEVEL;  Surgeon: Eustace Moore, MD;  Location: [place] NEURO ORS;  Service: Neurosurgery;  Laterality: Right;  LUMBAR LAMINECTOMY/DECOMPRESSION MICRODISCECTOMY 1 LEVEL  ? TONSILLECTOMY  yrs ago  ? TOTAL KNEE ARTHROPLASTY  02/09/2012  ? Procedure: TOTAL KNEE ARTHROPLASTY;  Surgeon: Lorn Junes, MD;  Location: [place];  Service: Orthopedics;  Laterality: Right;  ? ? ?MEDICATIONS: ? amoxicillin (AMOXIL) 250 MG capsule  ? atorvastatin (LIPITOR) 40 MG tablet  ? Azelastine HCl 137 MCG/SPRAY SOLN  ? dorzolamide-timolol (COSOPT) 22.3-6.8 MG/ML ophthalmic  solution  ? hydrochlorothiazide (HYDRODIURIL) 25 MG tablet  ? latanoprost (XALATAN) 0.005 % ophthalmic solution  ? losartan (COZAAR) 50 MG tablet  ? Multiple Vitamin (MULTIVITAMIN WITH MINERALS) TABS tablet  ? Potassium 99 MG TABS  ? RESTASIS 0.05 % ophthalmic emulsion  ? rivaroxaban (XARELTO) 20 MG TABS tablet  ? tamsulosin (FLOMAX) 0.4 MG CAPS capsule  ? vitamin C (ASCORBIC ACID) 500 MG tablet  ? ?No current facility-administered medications for this encounter.  ? ? ?Konrad Felix Ward, PA-C ?[facility] ?[phone] ? ? ? ? ? ? ?

## 2021-06-27 ENCOUNTER — Encounter (HOSPITAL_COMMUNITY)
Admission: RE | Admit: 2021-06-27 | Discharge: 2021-06-27 | Disposition: A | Discharge status: Home / Self Care | Payer: Medicare HMO | Source: Ambulatory Visit | Attending: Orthopedic Surgery | Admitting: Orthopedic Surgery

## 2021-06-27 DIAGNOSIS — Z20822 Contact with and (suspected) exposure to covid-19: Secondary | ICD-10-CM | POA: Insufficient documentation

## 2021-06-27 DIAGNOSIS — Z01812 Encounter for preprocedural laboratory examination: Secondary | ICD-10-CM | POA: Insufficient documentation

## 2021-06-27 LAB — SARS CORONAVIRUS 2 (TAT 6-24 HRS): SARS Coronavirus 2: NEGATIVE

## 2021-06-28 DIAGNOSIS — G4733 Obstructive sleep apnea (adult) (pediatric): Secondary | ICD-10-CM | POA: Diagnosis not present

## 2021-06-28 DIAGNOSIS — N401 Enlarged prostate with lower urinary tract symptoms: Secondary | ICD-10-CM | POA: Diagnosis not present

## 2021-06-28 DIAGNOSIS — I10 Essential (primary) hypertension: Secondary | ICD-10-CM | POA: Diagnosis not present

## 2021-06-28 DIAGNOSIS — I700 Atherosclerosis of aorta: Secondary | ICD-10-CM | POA: Diagnosis not present

## 2021-06-28 DIAGNOSIS — Z0000 Encounter for general adult medical examination without abnormal findings: Secondary | ICD-10-CM | POA: Diagnosis not present

## 2021-06-28 DIAGNOSIS — D6869 Other thrombophilia: Secondary | ICD-10-CM | POA: Diagnosis not present

## 2021-06-28 DIAGNOSIS — I4811 Longstanding persistent atrial fibrillation: Secondary | ICD-10-CM | POA: Diagnosis not present

## 2021-06-28 DIAGNOSIS — E785 Hyperlipidemia, unspecified: Secondary | ICD-10-CM | POA: Diagnosis not present

## 2021-06-28 DIAGNOSIS — E669 Obesity, unspecified: Secondary | ICD-10-CM | POA: Diagnosis not present

## 2021-06-28 DIAGNOSIS — Z23 Encounter for immunization: Secondary | ICD-10-CM | POA: Diagnosis not present

## 2021-07-01 ENCOUNTER — Ambulatory Visit (HOSPITAL_COMMUNITY)
Admission: RE | Admit: 2021-07-01 | Discharge: 2021-07-02 | Disposition: A | Discharge status: Home / Self Care | Payer: Medicare HMO | Source: Ambulatory Visit | Attending: Orthopedic Surgery | Admitting: Orthopedic Surgery

## 2021-07-01 ENCOUNTER — Ambulatory Visit (HOSPITAL_COMMUNITY): Payer: Medicare HMO

## 2021-07-01 ENCOUNTER — Ambulatory Visit (HOSPITAL_COMMUNITY): Payer: Medicare HMO | Admitting: Physician Assistant

## 2021-07-01 ENCOUNTER — Other Ambulatory Visit: Payer: Self-pay

## 2021-07-01 ENCOUNTER — Ambulatory Visit (HOSPITAL_BASED_OUTPATIENT_CLINIC_OR_DEPARTMENT_OTHER): Payer: Medicare HMO | Admitting: Anesthesiology

## 2021-07-01 ENCOUNTER — Encounter (HOSPITAL_COMMUNITY)
Admission: RE | Disposition: A | Discharge status: Home / Self Care | Payer: Self-pay | Source: Ambulatory Visit | Attending: Orthopedic Surgery

## 2021-07-01 ENCOUNTER — Encounter (HOSPITAL_COMMUNITY): Payer: Self-pay | Admitting: Orthopedic Surgery

## 2021-07-01 DIAGNOSIS — M25762 Osteophyte, left knee: Secondary | ICD-10-CM | POA: Diagnosis not present

## 2021-07-01 DIAGNOSIS — I10 Essential (primary) hypertension: Secondary | ICD-10-CM | POA: Diagnosis not present

## 2021-07-01 DIAGNOSIS — D759 Disease of blood and blood-forming organs, unspecified: Secondary | ICD-10-CM | POA: Insufficient documentation

## 2021-07-01 DIAGNOSIS — I4891 Unspecified atrial fibrillation: Secondary | ICD-10-CM | POA: Diagnosis not present

## 2021-07-01 DIAGNOSIS — M1712 Unilateral primary osteoarthritis, left knee: Secondary | ICD-10-CM | POA: Diagnosis present

## 2021-07-01 DIAGNOSIS — G4730 Sleep apnea, unspecified: Secondary | ICD-10-CM | POA: Insufficient documentation

## 2021-07-01 DIAGNOSIS — Z9989 Dependence on other enabling machines and devices: Secondary | ICD-10-CM

## 2021-07-01 DIAGNOSIS — Z87891 Personal history of nicotine dependence: Secondary | ICD-10-CM | POA: Diagnosis not present

## 2021-07-01 DIAGNOSIS — G4733 Obstructive sleep apnea (adult) (pediatric): Secondary | ICD-10-CM | POA: Diagnosis not present

## 2021-07-01 DIAGNOSIS — Z471 Aftercare following joint replacement surgery: Secondary | ICD-10-CM | POA: Diagnosis not present

## 2021-07-01 DIAGNOSIS — Z96642 Presence of left artificial hip joint: Secondary | ICD-10-CM | POA: Diagnosis not present

## 2021-07-01 DIAGNOSIS — Z9889 Other specified postprocedural states: Secondary | ICD-10-CM

## 2021-07-01 DIAGNOSIS — G8918 Other acute postprocedural pain: Secondary | ICD-10-CM | POA: Diagnosis not present

## 2021-07-01 LAB — TYPE AND SCREEN
ABO/RH(D): O POS
Antibody Screen: NEGATIVE

## 2021-07-01 SURGERY — ARTHROPLASTY, KNEE, TOTAL
Anesthesia: Regional | Site: Knee | Laterality: Left

## 2021-07-01 MED ORDER — DEXAMETHASONE SODIUM PHOSPHATE 10 MG/ML IJ SOLN
8.0000 mg | Freq: Once | INTRAMUSCULAR | Status: AC
  Administered 2021-07-01: 8 mg via INTRAVENOUS

## 2021-07-01 MED ORDER — ASCORBIC ACID 500 MG PO TABS
500.0000 mg | ORAL_TABLET | Freq: Every day | ORAL | Status: DC
  Administered 2021-07-02: 500 mg via ORAL
  Filled 2021-07-01: qty 1

## 2021-07-01 MED ORDER — SODIUM CHLORIDE 0.9 % IR SOLN
Status: DC | PRN
  Administered 2021-07-01: 3000 mL

## 2021-07-01 MED ORDER — BUPIVACAINE LIPOSOME 1.3 % IJ SUSP
INTRAMUSCULAR | Status: AC
  Filled 2021-07-01: qty 20

## 2021-07-01 MED ORDER — ACETAMINOPHEN 325 MG PO TABS
325.0000 mg | ORAL_TABLET | Freq: Four times a day (QID) | ORAL | Status: DC | PRN
  Administered 2021-07-02: 650 mg via ORAL
  Filled 2021-07-01: qty 2

## 2021-07-01 MED ORDER — DORZOLAMIDE HCL-TIMOLOL MAL 2-0.5 % OP SOLN
1.0000 [drp] | Freq: Two times a day (BID) | OPHTHALMIC | Status: DC
  Administered 2021-07-01 – 2021-07-02 (×2): 1 [drp] via OPHTHALMIC
  Filled 2021-07-01: qty 10

## 2021-07-01 MED ORDER — MENTHOL 3 MG MT LOZG: 1.0000 | LOZENGE | OROMUCOSAL | Status: DC | PRN

## 2021-07-01 MED ORDER — ORAL CARE MOUTH RINSE: 15.0000 mL | Freq: Once | OROMUCOSAL | Status: AC

## 2021-07-01 MED ORDER — ONDANSETRON HCL 4 MG/2ML IJ SOLN
4.0000 mg | Freq: Four times a day (QID) | INTRAMUSCULAR | Status: DC | PRN
  Administered 2021-07-01: 4 mg via INTRAVENOUS
  Filled 2021-07-01: qty 2

## 2021-07-01 MED ORDER — TRANEXAMIC ACID-NACL 1000-0.7 MG/100ML-% IV SOLN
1000.0000 mg | INTRAVENOUS | Status: AC
  Administered 2021-07-01: 1000 mg via INTRAVENOUS
  Filled 2021-07-01: qty 100

## 2021-07-01 MED ORDER — FENTANYL CITRATE PF 50 MCG/ML IJ SOSY
PREFILLED_SYRINGE | INTRAMUSCULAR | Status: AC
  Filled 2021-07-01: qty 2

## 2021-07-01 MED ORDER — CLONIDINE HCL (ANALGESIA) 100 MCG/ML EP SOLN
EPIDURAL | Status: DC | PRN
  Administered 2021-07-01: 100 ug

## 2021-07-01 MED ORDER — LOSARTAN POTASSIUM 50 MG PO TABS
50.0000 mg | ORAL_TABLET | Freq: Every day | ORAL | Status: DC
  Administered 2021-07-02: 50 mg via ORAL
  Filled 2021-07-01: qty 1

## 2021-07-01 MED ORDER — LACTATED RINGERS IV SOLN: INTRAVENOUS | Status: DC

## 2021-07-01 MED ORDER — DOCUSATE SODIUM 100 MG PO CAPS
100.0000 mg | ORAL_CAPSULE | Freq: Two times a day (BID) | ORAL | Status: DC
  Administered 2021-07-01 – 2021-07-02 (×2): 100 mg via ORAL
  Filled 2021-07-01 (×2): qty 1

## 2021-07-01 MED ORDER — OXYCODONE HCL 5 MG PO TABS
5.0000 mg | ORAL_TABLET | ORAL | Status: DC | PRN
  Administered 2021-07-01 – 2021-07-02 (×2): 10 mg via ORAL
  Filled 2021-07-01 (×2): qty 2

## 2021-07-01 MED ORDER — LIDOCAINE 2% (20 MG/ML) 5 ML SYRINGE
INTRAMUSCULAR | Status: DC | PRN
  Administered 2021-07-01: 100 mg via INTRAVENOUS

## 2021-07-01 MED ORDER — MIDAZOLAM HCL 2 MG/2ML IJ SOLN: 1.0000 mg | Freq: Once | INTRAMUSCULAR | Status: DC

## 2021-07-01 MED ORDER — TRANEXAMIC ACID 1000 MG/10ML IV SOLN
2000.0000 mg | INTRAVENOUS | Status: DC
  Filled 2021-07-01: qty 20

## 2021-07-01 MED ORDER — FENTANYL CITRATE PF 50 MCG/ML IJ SOSY
PREFILLED_SYRINGE | INTRAMUSCULAR | Status: AC
  Filled 2021-07-01: qty 1

## 2021-07-01 MED ORDER — TAMSULOSIN HCL 0.4 MG PO CAPS
0.4000 mg | ORAL_CAPSULE | Freq: Every day | ORAL | Status: DC
  Administered 2021-07-02: 0.4 mg via ORAL
  Filled 2021-07-01: qty 1

## 2021-07-01 MED ORDER — FENTANYL CITRATE (PF) 100 MCG/2ML IJ SOLN
INTRAMUSCULAR | Status: DC | PRN
  Administered 2021-07-01: 25 ug via INTRAVENOUS
  Administered 2021-07-01: 50 ug via INTRAVENOUS
  Administered 2021-07-01: 25 ug via INTRAVENOUS
  Administered 2021-07-01: 100 ug via INTRAVENOUS
  Administered 2021-07-01: 50 ug via INTRAVENOUS

## 2021-07-01 MED ORDER — CEFAZOLIN SODIUM-DEXTROSE 2-4 GM/100ML-% IV SOLN
2.0000 g | INTRAVENOUS | Status: AC
  Administered 2021-07-01: 2 g via INTRAVENOUS
  Filled 2021-07-01: qty 100

## 2021-07-01 MED ORDER — BUPIVACAINE-EPINEPHRINE (PF) 0.5% -1:200000 IJ SOLN
INTRAMUSCULAR | Status: DC | PRN
  Administered 2021-07-01: 30 mL via PERINEURAL

## 2021-07-01 MED ORDER — WATER FOR IRRIGATION, STERILE IR SOLN
Status: DC | PRN
  Administered 2021-07-01: 2000 mL

## 2021-07-01 MED ORDER — SODIUM CHLORIDE 0.9% FLUSH
INTRAVENOUS | Status: DC | PRN
  Administered 2021-07-01: 60 mL

## 2021-07-01 MED ORDER — FENTANYL CITRATE PF 50 MCG/ML IJ SOSY
25.0000 ug | PREFILLED_SYRINGE | INTRAMUSCULAR | Status: DC | PRN
  Administered 2021-07-01 (×3): 50 ug via INTRAVENOUS

## 2021-07-01 MED ORDER — ACETAMINOPHEN 500 MG PO TABS
1000.0000 mg | ORAL_TABLET | Freq: Once | ORAL | Status: AC
Start: 2021-07-01 — End: 2021-07-01
  Administered 2021-07-01: 1000 mg via ORAL
  Filled 2021-07-01: qty 2

## 2021-07-01 MED ORDER — ONDANSETRON HCL 4 MG/2ML IJ SOLN
INTRAMUSCULAR | Status: DC | PRN
  Administered 2021-07-01: 4 mg via INTRAVENOUS

## 2021-07-01 MED ORDER — SODIUM CHLORIDE (PF) 0.9 % IJ SOLN
INTRAMUSCULAR | Status: AC
  Filled 2021-07-01: qty 10

## 2021-07-01 MED ORDER — BUPIVACAINE LIPOSOME 1.3 % IJ SUSP
INTRAMUSCULAR | Status: DC | PRN
  Administered 2021-07-01: 20 mL

## 2021-07-01 MED ORDER — AMISULPRIDE (ANTIEMETIC) 5 MG/2ML IV SOLN: 10.0000 mg | Freq: Once | INTRAVENOUS | Status: DC | PRN

## 2021-07-01 MED ORDER — CHLORHEXIDINE GLUCONATE 0.12 % MT SOLN
15.0000 mL | Freq: Once | OROMUCOSAL | Status: AC
  Administered 2021-07-01: 15 mL via OROMUCOSAL

## 2021-07-01 MED ORDER — KETOROLAC TROMETHAMINE 15 MG/ML IJ SOLN
7.5000 mg | Freq: Four times a day (QID) | INTRAMUSCULAR | Status: DC
  Administered 2021-07-01 – 2021-07-02 (×3): 7.5 mg via INTRAVENOUS
  Filled 2021-07-01 (×3): qty 1

## 2021-07-01 MED ORDER — MIDAZOLAM HCL 2 MG/2ML IJ SOLN
INTRAMUSCULAR | Status: AC
  Filled 2021-07-01: qty 2

## 2021-07-01 MED ORDER — CYCLOSPORINE 0.05 % OP EMUL
1.0000 [drp] | Freq: Two times a day (BID) | OPHTHALMIC | Status: DC
  Administered 2021-07-02: 1 [drp] via OPHTHALMIC
  Filled 2021-07-01: qty 30

## 2021-07-01 MED ORDER — POVIDONE-IODINE 10 % EX SWAB
2.0000 | Freq: Once | CUTANEOUS | Status: AC
Start: 2021-07-01 — End: 2021-07-01
  Administered 2021-07-01: 2 via TOPICAL

## 2021-07-01 MED ORDER — SODIUM CHLORIDE 0.9 % IV SOLN
INTRAVENOUS | Status: DC
Start: 2021-07-01 — End: 2021-07-01

## 2021-07-01 MED ORDER — DIPHENHYDRAMINE HCL 12.5 MG/5ML PO ELIX: 12.5000 mg | ORAL_SOLUTION | ORAL | Status: DC | PRN

## 2021-07-01 MED ORDER — PHENOL 1.4 % MT LIQD: 1.0000 | OROMUCOSAL | Status: DC | PRN

## 2021-07-01 MED ORDER — HYDROCHLOROTHIAZIDE 25 MG PO TABS
37.5000 mg | ORAL_TABLET | Freq: Every day | ORAL | Status: DC
  Administered 2021-07-02: 37.5 mg via ORAL
  Filled 2021-07-01: qty 1

## 2021-07-01 MED ORDER — ONDANSETRON HCL 4 MG/2ML IJ SOLN: 4.0000 mg | Freq: Once | INTRAMUSCULAR | Status: DC | PRN

## 2021-07-01 MED ORDER — ZOLPIDEM TARTRATE 5 MG PO TABS: 5.0000 mg | ORAL_TABLET | Freq: Every evening | ORAL | Status: DC | PRN

## 2021-07-01 MED ORDER — 0.9 % SODIUM CHLORIDE (POUR BTL) OPTIME
TOPICAL | Status: DC | PRN
  Administered 2021-07-01: 1000 mL

## 2021-07-01 MED ORDER — LATANOPROST 0.005 % OP SOLN
1.0000 [drp] | Freq: Every day | OPHTHALMIC | Status: DC
  Administered 2021-07-01: 1 [drp] via OPHTHALMIC
  Filled 2021-07-01: qty 2.5

## 2021-07-01 MED ORDER — SODIUM CHLORIDE (PF) 0.9 % IJ SOLN
INTRAMUSCULAR | Status: AC
  Filled 2021-07-01: qty 50

## 2021-07-01 MED ORDER — HYDROMORPHONE HCL 1 MG/ML IJ SOLN: 0.5000 mg | INTRAMUSCULAR | Status: DC | PRN

## 2021-07-01 MED ORDER — ATORVASTATIN CALCIUM 40 MG PO TABS
40.0000 mg | ORAL_TABLET | Freq: Every day | ORAL | Status: DC
  Administered 2021-07-02: 40 mg via ORAL
  Filled 2021-07-01: qty 1

## 2021-07-01 MED ORDER — RIVAROXABAN 10 MG PO TABS
10.0000 mg | ORAL_TABLET | Freq: Every day | ORAL | Status: DC
  Administered 2021-07-02: 10 mg via ORAL
  Filled 2021-07-01: qty 1

## 2021-07-01 MED ORDER — FENTANYL CITRATE (PF) 250 MCG/5ML IJ SOLN
INTRAMUSCULAR | Status: AC
  Filled 2021-07-01: qty 5

## 2021-07-01 MED ORDER — CEFAZOLIN SODIUM-DEXTROSE 2-4 GM/100ML-% IV SOLN
2.0000 g | Freq: Four times a day (QID) | INTRAVENOUS | Status: AC
  Administered 2021-07-01 (×2): 2 g via INTRAVENOUS
  Filled 2021-07-01 (×2): qty 100

## 2021-07-01 MED ORDER — ONDANSETRON HCL 4 MG PO TABS: 4.0000 mg | ORAL_TABLET | Freq: Four times a day (QID) | ORAL | Status: DC | PRN

## 2021-07-01 MED ORDER — PROPOFOL 10 MG/ML IV BOLUS
INTRAVENOUS | Status: DC | PRN
Start: 2021-07-01 — End: 2021-07-01
  Administered 2021-07-01: 180 mg via INTRAVENOUS

## 2021-07-01 MED ORDER — BUPIVACAINE LIPOSOME 1.3 % IJ SUSP: 20.0000 mL | Freq: Once | INTRAMUSCULAR | Status: DC

## 2021-07-01 MED ORDER — PANTOPRAZOLE SODIUM 40 MG PO TBEC
40.0000 mg | DELAYED_RELEASE_TABLET | Freq: Every day | ORAL | Status: DC
  Administered 2021-07-01 – 2021-07-02 (×2): 40 mg via ORAL
  Filled 2021-07-01 (×2): qty 1

## 2021-07-01 MED ORDER — FENTANYL CITRATE PF 50 MCG/ML IJ SOSY
50.0000 ug | PREFILLED_SYRINGE | Freq: Once | INTRAMUSCULAR | Status: AC
  Administered 2021-07-01: 50 ug via INTRAVENOUS

## 2021-07-01 SURGICAL SUPPLY — 54 items
BAG COUNTER SPONGE SURGICOUNT (BAG) IMPLANT
BLADE HEX COATED 2.75 (ELECTRODE) ×2 IMPLANT
BLADE SAG 18X100X1.27 (BLADE) ×2 IMPLANT
BLADE SAW SAG 35X64 .89 (BLADE) ×2 IMPLANT
BNDG ELASTIC 6X10 VLCR STRL LF (GAUZE/BANDAGES/DRESSINGS) ×2 IMPLANT
BOWL SMART MIX CTS (DISPOSABLE) ×1 IMPLANT
CEMENT BONE R 1X40 (Cement) ×2 IMPLANT
CHLORAPREP W/TINT 26 (MISCELLANEOUS) ×4 IMPLANT
COMP FEM CEMT PERSONA SZ9 LT (Knees) ×2 IMPLANT
COMPONENT FEM CEMT PRNSA SZ9LT (Knees) IMPLANT
COVER SURGICAL LIGHT HANDLE (MISCELLANEOUS) ×2 IMPLANT
CUFF TOURN SGL QUICK 34 (TOURNIQUET CUFF) ×2
CUFF TRNQT CYL 34X4.125X (TOURNIQUET CUFF) ×1 IMPLANT
DERMABOND ADVANCED (GAUZE/BANDAGES/DRESSINGS) ×1
DERMABOND ADVANCED .7 DNX12 (GAUZE/BANDAGES/DRESSINGS) ×1 IMPLANT
DRAPE INCISE IOBAN 66X45 STRL (DRAPES) IMPLANT
DRAPE INCISE IOBAN 85X60 (DRAPES) ×2 IMPLANT
DRAPE SHEET LG 3/4 BI-LAMINATE (DRAPES) ×2 IMPLANT
DRAPE U-SHAPE 47X51 STRL (DRAPES) ×2 IMPLANT
DRESSING AQUACEL AG SP 3.5X10 (GAUZE/BANDAGES/DRESSINGS) ×1 IMPLANT
DRSG AQUACEL AG ADV 3.5X10 (GAUZE/BANDAGES/DRESSINGS) ×1 IMPLANT
DRSG AQUACEL AG SP 3.5X10 (GAUZE/BANDAGES/DRESSINGS) ×2
GLOVE SRG 8 PF TXTR STRL LF DI (GLOVE) ×1 IMPLANT
GLOVE SURG ENC MOIS LTX SZ8 (GLOVE) ×4 IMPLANT
GLOVE SURG UNDER POLY LF SZ8 (GLOVE) ×2
GOWN STRL REUS W/TWL XL LVL3 (GOWN DISPOSABLE) ×2 IMPLANT
HANDPIECE INTERPULSE COAX TIP (DISPOSABLE) ×2
HDLS TROCR DRIL PIN KNEE 75 (PIN) ×2
HOOD PEEL AWAY FLYTE STAYCOOL (MISCELLANEOUS) ×6 IMPLANT
MANIFOLD NEPTUNE II (INSTRUMENTS) ×2 IMPLANT
MARKER SKIN DUAL TIP RULER LAB (MISCELLANEOUS) ×4 IMPLANT
NS IRRIG 1000ML POUR BTL (IV SOLUTION) ×2 IMPLANT
PACK TOTAL KNEE CUSTOM (KITS) ×2 IMPLANT
PIN DRILL HDLS TROCAR 75 4PK (PIN) IMPLANT
PROTECTOR NERVE ULNAR (MISCELLANEOUS) ×2 IMPLANT
SCREW HEADED 33MM KNEE (MISCELLANEOUS) ×2 IMPLANT
SET HNDPC FAN SPRY TIP SCT (DISPOSABLE) ×1 IMPLANT
SOLUTION IRRIG SURGIPHOR (IV SOLUTION) ×2 IMPLANT
SPIKE FLUID TRANSFER (MISCELLANEOUS) ×2 IMPLANT
STEM POLY PAT PLY 32M KNEE (Knees) ×1 IMPLANT
STEM TIBIA 5 DEG SZ F L KNEE (Knees) IMPLANT
STEM TIBIAL 10 8-11 EF POLY LT (Joint) ×1 IMPLANT
SUT MNCRL AB 3-0 PS2 18 (SUTURE) ×2 IMPLANT
SUT STRATAFIX 0 PDS 27 VIOLET (SUTURE) ×2
SUT STRATAFIX PDO 1 14 VIOLET (SUTURE) ×2
SUT STRATFX PDO 1 14 VIOLET (SUTURE) ×1
SUT VIC AB 2-0 CT2 27 (SUTURE) ×4 IMPLANT
SUTURE STRATFX 0 PDS 27 VIOLET (SUTURE) ×1 IMPLANT
SUTURE STRATFX PDO 1 14 VIOLET (SUTURE) ×1 IMPLANT
SYR 50ML LL SCALE MARK (SYRINGE) ×2 IMPLANT
TIBIA STEM 5 DEG SZ F L KNEE (Knees) ×2 IMPLANT
TRAY FOLEY MTR SLVR 14FR STAT (SET/KITS/TRAYS/PACK) IMPLANT
TUBE SUCTION HIGH CAP CLEAR NV (SUCTIONS) ×2 IMPLANT
WRAP KNEE MAXI GEL POST OP (GAUZE/BANDAGES/DRESSINGS) ×1 IMPLANT

## 2021-07-01 NOTE — Progress Notes (Signed)
Assisted Dr. Ellender with left, ultrasound guided, adductor canal block. Side rails up, monitors on throughout procedure. See vital signs in flow sheet. Tolerated Procedure well.  

## 2021-07-01 NOTE — Interval H&P Note (Signed)
The patient has been re-examined, and the chart reviewed, and there have been no interval changes to the documented history and physical.   ? ?Plan for L TKA today. ? ?The operative side was examined and the patient was confirmed to have. Sens DPN, SPN, TN intact, Motor EHL, ext, flex 5/5, and DP 2+, PT 2+, No significant edema. ? ? ?The risks, benefits, and alternatives have been discussed at length with patient, and the patient is willing to proceed.  Left knee marked. Consent has been signed. ? ?

## 2021-07-01 NOTE — Transfer of Care (Signed)
Immediate Anesthesia Transfer of Care Note ? ?Patient: Caleb Barton ? ?Procedure(s) Performed: TOTAL KNEE ARTHROPLASTY (Left: Knee) ? ?Patient Location: PACU ? ?Anesthesia Type:General ? ?Level of Consciousness: awake, alert  and oriented ? ?Airway & Oxygen Therapy: Patient Spontanous Breathing and Patient connected to face mask oxygen ? ?Post-op Assessment: Report given to RN and Post -op Vital signs reviewed and stable ? ?Post vital signs: Reviewed and stable ? ?Last Vitals:  ?Vitals Value Taken Time  ?BP 134/101 07/01/21 1301  ?Temp    ?Pulse 60 07/01/21 1303  ?Resp 15 07/01/21 1303  ?SpO2 97 % 07/01/21 1303  ?Vitals shown include unvalidated device data. ? ?Last Pain:  ?Vitals:  ? 07/01/21 0952  ?TempSrc:   ?PainSc: 0-No pain  ?   ? ?  ? ?Complications: No notable events documented. ?

## 2021-07-01 NOTE — Anesthesia Procedure Notes (Signed)
Anesthesia Regional Block: Adductor canal block  ? ?Pre-Anesthetic Checklist: , timeout performed,  Correct Patient, Correct Site, Correct Laterality,  Correct Procedure,, site marked,  Risks and benefits discussed,  Surgical consent,  Pre-op evaluation,  At surgeon's request and post-op pain management ? ?Laterality: Left ? ?Prep: chloraprep     ?  ?Needles:  ?Injection technique: Single-shot ? ?Needle Type: Echogenic Stimulator Needle   ? ? ?Needle Length: 9cm  ?Needle Gauge: 21  ? ? ? ?Additional Needles: ? ? ?Procedures:,,,, ultrasound used (permanent image in chart),,    ?Narrative:  ?Start time: 07/01/2021 9:40 AM ?End time: 07/01/2021 9:50 AM ?Injection made incrementally with aspirations every 5 mL. ? ?Performed by: Personally  ?Anesthesiologist: Murvin Natal, MD ? ?Additional Notes: ?Functioning IV was confirmed and monitors were applied. A time-out was performed. Hand hygiene and sterile gloves were used. The thigh was placed in a frog-leg position and prepped in a sterile fashion. A 22m 21ga Arrow echogenic stimulator needle was placed using ultrasound guidance.  Negative aspiration and negative test dose prior to incremental administration of local anesthetic. The patient tolerated the procedure well. ? ? ? ? ? ? ?

## 2021-07-01 NOTE — Anesthesia Procedure Notes (Signed)
Procedure Name: LMA Insertion ?Date/Time: 07/01/2021 10:44 AM ?Performed by: Gean Maidens, CRNA ?Pre-anesthesia Checklist: Patient identified, Emergency Drugs available, Suction available, Patient being monitored and Timeout performed ?Patient Re-evaluated:Patient Re-evaluated prior to induction ?Oxygen Delivery Method: Circle system utilized ?Preoxygenation: Pre-oxygenation with 100% oxygen ?Induction Type: IV induction ?Ventilation: Mask ventilation without difficulty ?LMA: LMA inserted ?LMA Size: 4.0 ?Number of attempts: 1 ?Placement Confirmation: positive ETCO2 and breath sounds checked- equal and bilateral ?Tube secured with: Tape ?Dental Injury: Teeth and Oropharynx as per pre-operative assessment  ? ? ? ? ?

## 2021-07-01 NOTE — Op Note (Signed)
DATE OF SURGERY:  07/01/2021 ?TIME: 12:44 PM ? ?PATIENT NAME:  Caleb Barton   ?AGE: 81 y.o.  ? ? ?PRE-OPERATIVE DIAGNOSIS:  End-stage left knee osteoarthritis ? ?POST-OPERATIVE DIAGNOSIS:  Same ? ?PROCEDURE:  Left Total Knee Arthroplasty ? ?SURGEON:  Willaim Sheng, MD  ? ?ASSISTANT: Izola Price, RNFA, present and scrubbed throughout the case, critical for assistance with exposure, retraction, instrumentation, and closure. ? ? ?OPERATIVE IMPLANTS:  ?Cemented Zimmer persona, 9 standard CR femur, F tibia, 32 mm patella, 10 mm MC poly ?Implant Name Type Inv. Item Serial No. Manufacturer Lot No. LRB No. Used Action  ?CEMENT BONE R 1X40 - [id number] Cement CEMENT BONE R 1X40  [employer](ORTH,TRAU,BIO,SG) [id number] Left 1 Implanted  ?CEMENT BONE R 1X40 - [id number] Cement CEMENT BONE R 1X40  [employer](ORTH,TRAU,BIO,SG) [id number] Left 1 Implanted  ?TIBIA STEM 5 DEG SZ F L KNEE - [id number] Knees TIBIA STEM 5 DEG SZ F L KNEE  [employer](ORTH,TRAU,BIO,SG) [id number] Left 1 Implanted  ?STEM POLY PAT PLY 75M KNEE - [id number] Knees STEM POLY PAT PLY 75M KNEE  [employer](ORTH,TRAU,BIO,SG) [id number] Left 1 Implanted  ?COMP FEM CEMT PERSONA SZ9 LT - [id number] Knees COMP FEM CEMT PERSONA SZ9 LT  [employer](ORTH,TRAU,BIO,SG) [id number] Left 1 Implanted  ?STEM TIBIAL 10 8-11 EF POLY LT - [id number] Joint STEM TIBIAL 10 8-11 EF POLY LT  [employer](ORTH,TRAU,BIO,SG) [id number] Left 1 Implanted  ? ? ?  ?PREOPERATIVE INDICATIONS: ? ?Caleb Barton is a 81 y.o. year old male with end stage bone on bone degenerative arthritis of the knee who failed conservative treatment, including injections, antiinflammatories, activity modification, and assistive devices, and had significant impairment of their activities of daily living, and elected for Total Knee Arthroplasty.  ? ?The risks, benefits, and alternatives were discussed at length including but not limited to the risks of infection, bleeding, nerve injury, stiffness, blood clots,  the need for revision surgery, cardiopulmonary complications, among others, and they were willing to proceed. ? ?ESTIMATED BLOOD LOSS: 50cc ? ?OPERATIVE DESCRIPTION: ? ? Once adequate anesthesia, preoperative antibiotics, 2 gm of ancef,1 gm of Tranexamic Acid, and 8 mg of Decadron administered, the patient was positioned supine with a left thigh tourniquet placed.  The left lower extremity was prepped and draped in sterile fashion.  A time-  out was performed identifying the patient, planned procedure, and the appropriate extremity.  ?   ?The leg was  exsanguinated, tourniquet elevated to 250 mmHg.  A midline incision was  ? made followed by median parapatellar arthrotomy. Anterior horn of the medial meniscus was released and resected. A medial release was performed, the infrapatellar fat pad was resected with care taken to protect the patellar tendon. The suprapatellar fat was removed to exposed the distal anterior femur. The anterior horn of the lateral meniscus and ACL were released.   ? ?Following initial  exposure, attention was first to the femur.  The femoral  ? canal was opened with a drill, irrigated to try to prevent fat emboli.  An  ? intramedullary rod was passed set at 5 degrees valgus, 10 mm. The distal femur was resected.  Following this resection, the tibia was  ? subluxated anteriorly.  Using the extramedullary guide, 10 mm of bone was resected off  ? the proximal lateral tibia.  We confirmed the gap would be  ? stable medially and laterally with a size 86m spacer block as well as confirmed that the tibial cut was perpendicular in the coronal plane, checking  with an alignment rod.  ? ? Once this was done, the posterior femoral referencing femoral sizer was placed under to the posterior condyles with 3 degrees of external rotational. The femur was sized to be a size 9 in the anterior-  posterior dimension. The  ? anterior, posterior, and  chamfer cuts were made without difficulty nor  ? notching  making certain that I was along the anterior cortex to help  ? with flexion gap stability.  ?Next a laminar spreader was placed with the knee in flexion and the medial lateral menisci were resected.  5 cc of the Exparel mixture was injected in the medial side of the back of the knee and 3 cc in the lateral side.  1/2 inch curved osteotome was used to resect posterior osteophyte that was then removed with a pituitary rongeur.  ?    ? At this point, the tibia was sized to be a size F.  The size F tray was  ? then pinned in position. Trial reduction was now carried with a 9 femur, ? F tibia, a 10 mm MC insert.  The knee had full extension and was stable to varus valgus stress in extension.  The knee was slightly tight in flexion and the PCL was partially released.  ? ?Attention was next directed to the patella.  Precut  measurement was noted to be 25 mm.  I resected down to 14 mm and used a  27m  patellar button to restore patellar height as well as cover the cut surface.  ? ? ? The patella lug holes were drilled and a 32 mm patella poly trial was placed. ? ?  The knee was brought to full extension with good flexion stability with the patella  ? tracking through the trochlea without application of pressure.   ? ? ?Next the femoral component was again assessed and determined to be seated and appropriately lateralized.  The femoral lug holes were drilled.  The femoral component was then removed.Tibial component was again assessed and felt to be seated and appropriately rotated with the medial third of the tubercle. The tibia was then drilled, and keel punched.   ? ? Final components were  opened and regular cement was mixed.   ?   ?Final implants were then  cemented onto cleaned and dried cut surfaces of bone with the knee brought to extension with a 10 mm MC poly.  The knee was irrigated with surgiphor betadine irrigation as well as pulse lavage normal saline.  The synovial lining was  then injected a dilute Exparel.   ?   ? Once the cement had fully cured, excess cement was removed  ? throughout the knee.  I confirmed that I was satisfied with the range of  ? motion and stability, and the final 10 mm MC poly insert was chosen.  It was  ? placed into the knee.  ?   ?   ? The tourniquet had been let down.  No significant  ? hemostasis was required.  The medial parapatellar arthrotomy was then reapproximated using #1 Stratafix sutures with the knee  in flexion.  The  ? remaining wound was closed with 0 stratafix, 2-0 Vicryl, and running 3-0 Monocryl.  ? The knee was cleaned, dried, dressed sterilely using Dermabond and  ? Aquacel dressing.  The patient was then  brought to recovery room in stable condition, tolerating the procedure  well. There were no complications. ? ? ?Post op recs: ?WB:  WBAT ?Abx: ancef x23 hours post op ?Imaging: PACU xrays ?DVT prophylaxis: Xarelto '10mg'$  POD1-2 then resume Xarelto '20mg'$  POD3 ?Follow up: 2 weeks after surgery for a wound check with Dr. Zachery Dakins at [facility].  ?Address: [address] [place], [place], [place] [address]  ?Office Phone: [phone] ? ?Charlies Constable, MD ?Orthopaedic Surgery ? ? ? ? ? ? ? ? ? ? ? ? ?  ?

## 2021-07-01 NOTE — Discharge Instructions (Signed)
INSTRUCTIONS AFTER JOINT REPLACEMENT  ° °Remove items at home which could result in a fall. This includes throw rugs or furniture in walking pathways °ICE to the affected joint every three hours while awake for 30 minutes at a time, for at least the first 3-5 days, and then as needed for pain and swelling.  Continue to use ice for pain and swelling. You may notice swelling that will progress down to the foot and ankle.  This is normal after surgery.  Elevate your leg when you are not up walking on it.   °Continue to use the breathing machine you got in the hospital (incentive spirometer) which will help keep your temperature down.  It is common for your temperature to cycle up and down following surgery, especially at night when you are not up moving around and exerting yourself.  The breathing machine keeps your lungs expanded and your temperature down. ° ° °DIET:  As you were doing prior to hospitalization, we recommend a well-balanced diet. ° °DRESSING / WOUND CARE / SHOWERING ° °Keep the surgical dressing until follow up.  The dressing is water proof, so you can shower without any extra covering.  IF THE DRESSING FALLS OFF or the wound gets wet inside, change the dressing with sterile gauze.  Please use good hand washing techniques before changing the dressing.  Do not use any lotions or creams on the incision until instructed by your surgeon.   ° °ACTIVITY ° °Increase activity slowly as tolerated, but follow the weight bearing instructions below.   °No driving for 6 weeks or until further direction given by your physician.  You cannot drive while taking narcotics.  °No lifting or carrying greater than 10 lbs. until further directed by your surgeon. °Avoid periods of inactivity such as sitting longer than an hour when not asleep. This helps prevent blood clots.  °You may return to work once you are authorized by your doctor.  ° ° ° °WEIGHT BEARING  ° °Weight bearing as tolerated with assist device (walker, cane,  etc) as directed, use it as long as suggested by your surgeon or therapist, typically at least 4-6 weeks. ° ° °EXERCISES ° °Results after joint replacement surgery are often greatly improved when you follow the exercise, range of motion and muscle strengthening exercises prescribed by your doctor. Safety measures are also important to protect the joint from further injury. Any time any of these exercises cause you to have increased pain or swelling, decrease what you are doing until you are comfortable again and then slowly increase them. If you have problems or questions, call your caregiver or physical therapist for advice.  ° °Rehabilitation is important following a joint replacement. After just a few days of immobilization, the muscles of the leg can become weakened and shrink (atrophy).  These exercises are designed to build up the tone and strength of the thigh and leg muscles and to improve motion. Often times heat used for twenty to thirty minutes before working out will loosen up your tissues and help with improving the range of motion but do not use heat for the first two weeks following surgery (sometimes heat can increase post-operative swelling).  ° °These exercises can be done on a training (exercise) mat, on the floor, on a table or on a bed. Use whatever works the best and is most comfortable for you.    Use music or television while you are exercising so that the exercises are a pleasant break in your   day. This will make your life better with the exercises acting as a break in your routine that you can look forward to.   Perform all exercises about fifteen times, three times per day or as directed.  You should exercise both the operative leg and the other leg as well.  Exercises include:   Quad Sets - Tighten up the muscle on the front of the thigh (Quad) and hold for 5-10 seconds.   Straight Leg Raises - With your knee straight (if you were given a brace, keep it on), lift the leg to 60  degrees, hold for 3 seconds, and slowly lower the leg.  Perform this exercise against resistance later as your leg gets stronger.  Leg Slides: Lying on your back, slowly slide your foot toward your buttocks, bending your knee up off the floor (only go as far as is comfortable). Then slowly slide your foot back down until your leg is flat on the floor again.  Angel Wings: Lying on your back spread your legs to the side as far apart as you can without causing discomfort.  Hamstring Strength:  Lying on your back, push your heel against the floor with your leg straight by tightening up the muscles of your buttocks.  Repeat, but this time bend your knee to a comfortable angle, and push your heel against the floor.  You may put a pillow under the heel to make it more comfortable if necessary.   A rehabilitation program following joint replacement surgery can speed recovery and prevent re-injury in the future due to weakened muscles. Contact your doctor or a physical therapist for more information on knee rehabilitation.    CONSTIPATION  Constipation is defined medically as fewer than three stools per week and severe constipation as less than one stool per week.  Even if you have a regular bowel pattern at home, your normal regimen is likely to be disrupted due to multiple reasons following surgery.  Combination of anesthesia, postoperative narcotics, change in appetite and fluid intake all can affect your bowels.   YOU MUST use at least one of the following options; they are listed in order of increasing strength to get the job done.  They are all available over the counter, and you may need to use some, POSSIBLY even all of these options:    Drink plenty of fluids (prune juice may be helpful) and high fiber foods Colace 100 mg by mouth twice a day  Senokot for constipation as directed and as needed Dulcolax (bisacodyl), take with full glass of water  Miralax (polyethylene glycol) once or twice a day as  needed.  If you have tried all these things and are unable to have a bowel movement in the first 3-4 days after surgery call either your surgeon or your primary doctor.    If you experience loose stools or diarrhea, hold the medications until you stool forms back up.  If your symptoms do not get better within 1 week or if they get worse, check with your doctor.  If you experience "the worst abdominal pain ever" or develop nausea or vomiting, please contact the office immediately for further recommendations for treatment.   ITCHING:  If you experience itching with your medications, try taking only a single pain pill, or even half a pain pill at a time.  You can also use Benadryl over the counter for itching or also to help with sleep.   TED HOSE STOCKINGS:  Use stockings on both  legs until for at least 2 weeks or as directed by physician office. They may be removed at night for sleeping.  MEDICATIONS:  See your medication summary on the After Visit Summary that nursing will review with you.  You may have some home medications which will be placed on hold until you complete the course of blood thinner medication.  It is important for you to complete the blood thinner medication as prescribed.   Blood clot prevention (DVT Prophylaxis): After surgery you are at an increased risk for a blood clot. you will resume your xarelto to help reduce your risk of getting a blood clot. This will help prevent a blood clot. Signs of a pulmonary embolus (blood clot in the lungs) include sudden short of breath, feeling lightheaded or dizzy, chest pain with a deep breath, rapid pulse rapid breathing. Signs of a blood clot in your arms or legs include new unexplained swelling and cramping, warm, red or darkened skin around the painful area. Please call the office or 911 right away if these signs or symptoms develop.  PRECAUTIONS:  If you experience chest pain or shortness of breath - call 911 immediately for transfer to  the hospital emergency department.   If you develop a fever greater that 101 F, purulent drainage from wound, increased redness or drainage from wound, foul odor from the wound/dressing, or calf pain - CONTACT YOUR SURGEON.                                                   FOLLOW-UP APPOINTMENTS:  If you do not already have a post-op appointment, please call the office for an appointment to be seen by your surgeon.  Guidelines for how soon to be seen are listed in your After Visit Summary, but are typically between 2-3 weeks after surgery.  OTHER INSTRUCTIONS:   Knee Replacement:  Do not place pillow under knee, focus on keeping the knee straight while resting.  DO NOT modify, tear, cut, or change the foam block in any way.  POST-OPERATIVE OPIOID TAPER INSTRUCTIONS: It is important to wean off of your opioid medication as soon as possible. If you do not need pain medication after your surgery it is ok to stop day one. Opioids include: Codeine, Hydrocodone(Norco, Vicodin), Oxycodone(Percocet, oxycontin) and hydromorphone amongst others.  Long term and even short term use of opiods can cause: Increased pain response Dependence Constipation Depression Respiratory depression And more.  Withdrawal symptoms can include Flu like symptoms Nausea, vomiting And more Techniques to manage these symptoms Hydrate well Eat regular healthy meals Stay active Use relaxation techniques(deep breathing, meditating, yoga) Do Not substitute Alcohol to help with tapering If you have been on opioids for less than two weeks and do not have pain than it is ok to stop all together.  Plan to wean off of opioids This plan should start within one week post op of your joint replacement. Maintain the same interval or time between taking each dose and first decrease the dose.  Cut the total daily intake of opioids by one tablet each day Next start to increase the time between doses. The last dose that should be  eliminated is the evening dose.   MAKE SURE YOU:  Understand these instructions.  Get help right away if you are not doing well or get worse.  Thank you for letting us be a part of your medical care team.  It is a privilege we respect greatly.  We hope these instructions will help you stay on track for a fast and full recovery!

## 2021-07-01 NOTE — Progress Notes (Signed)
Orthopedic Tech Progress Note ?Patient Details:  ?Caleb Barton ?1940/08/12 ?[id number] ? ?Ortho Devices ?Type of Ortho Device: Bone foam zero knee ?Ortho Device/Splint Interventions: Application ?  ?Post Interventions ?Patient Tolerated: Well ?Instructions Provided: Care of device ? ?[place] Pink ?07/01/2021, 1:01 PM ? ?

## 2021-07-01 NOTE — Anesthesia Postprocedure Evaluation (Signed)
Anesthesia Post Note ? ?Patient: JAQUELL SEDDON ? ?Procedure(s) Performed: TOTAL KNEE ARTHROPLASTY (Left: Knee) ? ?  ? ?Patient location during evaluation: PACU ?Anesthesia Type: Regional and General ?Level of consciousness: awake ?Pain management: pain level controlled ?Vital Signs Assessment: post-procedure vital signs reviewed and stable ?Respiratory status: spontaneous breathing, nonlabored ventilation, respiratory function stable and patient connected to nasal cannula oxygen ?Cardiovascular status: blood pressure returned to baseline and stable ?Postop Assessment: no apparent nausea or vomiting ?Anesthetic complications: no ? ? ?No notable events documented. ? ?Last Vitals:  ?Vitals:  ? 07/01/21 1416 07/01/21 1810  ?BP: (!) 155/110 (!) 155/92  ?Pulse: 100 82  ?Resp: 16 17  ?Temp: (!) 36.4 ?C 36.6 ?C  ?SpO2: 95% 100%  ?  ?Last Pain:  ?Vitals:  ? 07/01/21 1929  ?TempSrc:   ?PainSc: 2   ? ? ?  ?  ?  ?  ?  ?  ? ?Gordon Carlson P Alfonzia Woolum ? ? ? ? ?

## 2021-07-01 NOTE — Evaluation (Addendum)
Physical Therapy Evaluation ?Patient Details ?Name: Caleb Barton ?MRN: [id number] ?DOB: 07/21/1940 ?Today's Date: 07/01/2021 ? ?History of Present Illness ? 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 The patient has been having nausea. Patient  needing to get up to urinate. Tolerated ambulating x ~ 30' in room, nausea intermittent. Patient should progress to DC home.Pt admitted with above diagnosis.  Pt currently with functional limitations due to the deficits listed below (see PT Problem List). Pt will benefit from skilled PT to increase their independence and safety with mobility to allow discharge to the venue listed below.   ?   ?   ? ?Recommendations for follow up therapy are one component of a multi-disciplinary discharge planning process, led by the attending physician.  Recommendations may be updated based on patient status, additional functional criteria and insurance authorization. ? ?Follow Up Recommendations Follow physician's recommendations for discharge plan and follow up therapies ? ?  ?Assistance Recommended at Discharge Intermittent Supervision/Assistance  ?Patient can return home with the following ? A little help with walking and/or transfers;A little help with bathing/dressing/bathroom;Help with stairs or ramp for entrance;Assistance with cooking/housework;Assist for transportation ? ?  ?Equipment Recommendations None recommended by PT  ?Recommendations for Other Services ?    ?  ?Functional Status Assessment Patient has had a recent decline in their functional status and demonstrates the ability to make significant improvements in function in a reasonable and predictable amount of time.  ? ?  ?Precautions / Restrictions Precautions ?Precautions: Fall;Knee  ? ?  ? ?Mobility ? Bed Mobility ?Overal bed mobility: Needs Assistance ?Bed Mobility: Supine to Sit ?  ?  ?Supine to sit: Min guard ?  ?  ?General bed mobility comments: safety ?   ? ?Transfers ?Overall transfer level: Needs assistance ?Equipment used: Rolling walker (2 wheels) ?Transfers: Sit to/from Stand ?Sit to Stand: Min assist ?  ?  ?  ?  ?  ?General transfer comment: cues for  left leg position ?  ? ?Ambulation/Gait ?Ambulation/Gait assistance: Min assist ?Gait Distance (Feet): 40 Feet ?Assistive device: Rolling walker (2 wheels) ?Gait Pattern/deviations: Step-through pattern ?  ?  ?  ?General Gait Details: gait smoothe,a d safety ? ?Stairs ?  ?  ?  ?  ?  ? ?Wheelchair Mobility ?  ? ?Modified Rankin (Stroke Patients Only) ?  ? ?  ? ?Balance Overall balance assessment: Needs assistance ?Sitting-balance support: No upper extremity supported, Bilateral upper extremity supported ?Sitting balance-Leahy Scale: Good ?  ?  ?Standing balance support: During functional activity, Bilateral upper extremity supported, Reliant on assistive device for balance ?Standing balance-Leahy Scale: Fair ?  ?  ?  ?  ?  ?  ?  ?  ?  ?  ?  ?  ?   ? ? ? ?Pertinent Vitals/Pain Pain Assessment ?Pain Assessment: 0-10 ?Pain Score: 3  ?Pain Location: left knee ?Pain Descriptors / Indicators: Discomfort ?Pain Intervention(s): Repositioned  ? ? ?Home Living Family/patient expects to be discharged to:: Private residence ?Living Arrangements: Spouse/significant other ?Available Help at Discharge: Family ?Type of Home: House ?Home Access: Stairs to enter ?Entrance Stairs-Rails: Right;Left ?Entrance Stairs-Number of Steps: 3-4 ?Alternate Level Stairs-Number of Steps: 13 ?Home Layout: Two level ?Home Equipment: Conservation officer, nature (2 wheels);Cane - single point;Crutches ?   ?  ?Prior Function Prior Level of Function : Independent/Modified Independent ?  ?  ?  ?  ?  ?  ?  ?  ?  ? ? ?  Hand Dominance  ?   ? ?  ?Extremity/Trunk Assessment  ? Upper Extremity Assessment ?Upper Extremity Assessment: Overall WFL for tasks assessed ?  ? ?Lower Extremity Assessment ?Lower Extremity Assessment: LLE deficits/detail ?LLE Deficits / Details:  -5-50 knee flexion, + SLR ?  ? ?Cervical / Trunk Assessment ?Cervical / Trunk Assessment: Normal  ?Communication  ? Communication: No difficulties  ?Cognition Arousal/Alertness: Awake/alert ?Behavior During Therapy: [facility] for tasks assessed/performed ?Overall Cognitive Status: Within Functional Limits for tasks assessed ?  ?  ?  ?  ?  ?  ?  ?  ?  ?  ?  ?  ?  ?  ?  ?  ?  ?  ?  ? ?  ?General Comments   ? ?  ?Exercises    ? ?Assessment/Plan  ?  ?PT Assessment Patient needs continued PT services  ?PT Problem List Decreased strength;Decreased mobility;Decreased safety awareness;Decreased range of motion;Decreased knowledge of precautions;Decreased activity tolerance;Pain;Decreased knowledge of use of DME ? ?   ?  ?PT Treatment Interventions DME instruction;Therapeutic activities;Cognitive remediation;Gait training;Therapeutic exercise;Patient/family education;Stair training;Functional mobility training   ? ?PT Goals (Current goals can be found in the Care Plan section)  ?Acute Rehab PT Goals ?Patient Stated Goal: not be nauseated ?PT Goal Formulation: With patient/family ?Time For Goal Achievement: 07/08/21 ?Potential to Achieve Goals: Good ? ?  ?Frequency 7X/week ?  ? ? ?Co-evaluation   ?  ?  ?  ?  ? ? ?  ?AM-PAC PT "6 Clicks" Mobility  ?Outcome Measure Help needed turning from your back to your side while in a flat bed without using bedrails?: A Little ?Help needed moving from lying on your back to sitting on the side of a flat bed without using bedrails?: A Little ?Help needed moving to and from a bed to a chair (including a wheelchair)?: A Little ?Help needed standing up from a chair using your arms (e.g., wheelchair or bedside chair)?: A Little ?Help needed to walk in hospital room?: A Little ?Help needed climbing 3-5 steps with a railing? : A Lot ?6 Click Score: 17 ? ?  ?End of Session Equipment Utilized During Treatment: Gait belt ?Activity Tolerance: Patient tolerated treatment well ?Patient left: in chair;with  call bell/phone within reach;with chair alarm set;with family/visitor present ?Nurse Communication: Mobility status ?PT Visit Diagnosis: Unsteadiness on feet (R26.81);Difficulty in walking, not elsewhere classified (R26.2);Dizziness and giddiness (R42) ?  ? ?Time: 9390-3009 ?PT Time Calculation (min) (ACUTE ONLY): 21 min ? ? ?Charges:   PT Evaluation ?$PT Eval Low Complexity: 1 Low ?  ?  ?   ? ? ?Tresa Endo PT ?Acute Rehabilitation Services ?Pager [phone] ?Office [phone] ? ? ?Adhvik Canady, Shella Maxim ?07/01/2021, 5:04 PM ? ?

## 2021-07-02 DIAGNOSIS — D759 Disease of blood and blood-forming organs, unspecified: Secondary | ICD-10-CM | POA: Diagnosis not present

## 2021-07-02 DIAGNOSIS — I10 Essential (primary) hypertension: Secondary | ICD-10-CM | POA: Diagnosis not present

## 2021-07-02 DIAGNOSIS — G4730 Sleep apnea, unspecified: Secondary | ICD-10-CM | POA: Diagnosis not present

## 2021-07-02 DIAGNOSIS — I4891 Unspecified atrial fibrillation: Secondary | ICD-10-CM | POA: Diagnosis not present

## 2021-07-02 DIAGNOSIS — M1712 Unilateral primary osteoarthritis, left knee: Secondary | ICD-10-CM | POA: Diagnosis not present

## 2021-07-02 DIAGNOSIS — Z87891 Personal history of nicotine dependence: Secondary | ICD-10-CM | POA: Diagnosis not present

## 2021-07-02 DIAGNOSIS — M25762 Osteophyte, left knee: Secondary | ICD-10-CM | POA: Diagnosis not present

## 2021-07-02 LAB — CBC
HCT: 37.4 % — ABNORMAL LOW (ref 39.0–52.0)
Hemoglobin: 12.7 g/dL — ABNORMAL LOW (ref 13.0–17.0)
MCH: 32.5 pg (ref 26.0–34.0)
MCHC: 34 g/dL (ref 30.0–36.0)
MCV: 95.7 fL (ref 80.0–100.0)
Platelets: 169 10*3/uL (ref 150–400)
RBC: 3.91 MIL/uL — ABNORMAL LOW (ref 4.22–5.81)
RDW: 13.2 % (ref 11.5–15.5)
WBC: 9.6 10*3/uL (ref 4.0–10.5)
nRBC: 0 % (ref 0.0–0.2)

## 2021-07-02 LAB — BASIC METABOLIC PANEL
Anion gap: 9 (ref 5–15)
BUN: 13 mg/dL (ref 8–23)
CO2: 26 mmol/L (ref 22–32)
Calcium: 8.3 mg/dL — ABNORMAL LOW (ref 8.9–10.3)
Chloride: 102 mmol/L (ref 98–111)
Creatinine, Ser: 0.91 mg/dL (ref 0.61–1.24)
GFR, Estimated: >60 mL/min (ref >60)
Glucose, Bld: 128 mg/dL — ABNORMAL HIGH (ref 70–99)
Potassium: 3.8 mmol/L (ref 3.5–5.1)
Sodium: 137 mmol/L (ref 135–145)

## 2021-07-02 MED ORDER — OXYCODONE HCL 5 MG PO TABS
5.0000 mg | ORAL_TABLET | ORAL | 0 refills | Status: AC | PRN
Start: 2021-07-02 — End: 2021-07-09

## 2021-07-02 MED ORDER — ONDANSETRON HCL 4 MG PO TABS: 4.0000 mg | ORAL_TABLET | Freq: Three times a day (TID) | ORAL | 0 refills | Status: AC | PRN

## 2021-07-02 MED ORDER — ACETAMINOPHEN 500 MG PO TABS: 1000.0000 mg | ORAL_TABLET | Freq: Three times a day (TID) | ORAL | 0 refills | Status: AC | PRN

## 2021-07-02 MED ORDER — METHOCARBAMOL 500 MG PO TABS: 500.0000 mg | ORAL_TABLET | Freq: Three times a day (TID) | ORAL | 0 refills | Status: AC | PRN

## 2021-07-02 MED ORDER — RIVAROXABAN 20 MG PO TABS: ORAL_TABLET | ORAL | 1 refills | Status: DC

## 2021-07-02 NOTE — Plan of Care (Signed)
Discharge instructions given to the patient including medications. ?

## 2021-07-02 NOTE — Progress Notes (Signed)
Physical Therapy Treatment ?Patient Details ?Name: Caleb Barton ?MRN: [id number] ?DOB: May 06, 1940 ?Today's Date: 07/02/2021 ? ? 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 ?  ?PT Comments  ? ? The patient reports minimal pain in left knee. Patient  has met PT goals for DC.    ?Recommendations for follow up therapy are one component of a multi-disciplinary discharge planning process, led by the attending physician.  Recommendations may be updated based on patient status, additional functional criteria and insurance authorization. ? ?Follow Up Recommendations ? Follow physician's recommendations for discharge plan and follow up therapies ?  ?  ?Assistance Recommended at Discharge    ?Patient can return home with the following A little help with walking and/or transfers;A little help with bathing/dressing/bathroom;Help with stairs or ramp for entrance;Assistance with cooking/housework;Assist for transportation ?  ?Equipment Recommendations ? None recommended by PT  ?  ?Recommendations for Other Services   ? ? ?  ?Precautions / Restrictions Precautions ?Precautions: Fall;Knee ?Restrictions ?LLE Weight Bearing: Weight bearing as tolerated  ?  ? ?Mobility ? Bed Mobility ?Overal bed mobility: Needs Assistance ?Bed Mobility: Supine to Sit ?  ?  ?Supine to sit: Supervision ?  ?  ?General bed mobility comments: safety ?  ? ?Transfers ?Overall transfer level: Needs assistance ?Equipment used: Rolling walker (2 wheels) ?Transfers: Sit to/from Stand ?Sit to Stand: Supervision ?  ?  ?  ?  ?  ?General transfer comment: cues for  left leg position and reaching back ?  ? ?Ambulation/Gait ?Ambulation/Gait assistance: Min guard ?Gait Distance (Feet): 160 Feet ?Assistive device: Rolling walker (2 wheels) ?Gait Pattern/deviations: Step-through pattern ?  ?  ?  ?General Gait Details: gait smoothe and safe ? ? ?Stairs ?Stairs: Yes ?Stairs assistance: Min guard ?Stair  Management: One rail Left, Forwards, With crutches ?Number of Stairs: 5 ?General stair comments: performed with 1 crutch and 1 rail ? ? ?Wheelchair Mobility ?  ? ?Modified Rankin (Stroke Patients Only) ?  ? ? ?  ?Balance Overall balance assessment: No apparent balance deficits (not formally assessed) ?  ?  ?  ?  ?  ?  ?  ?  ?  ?  ?  ?  ?  ?  ?  ?  ?  ?  ?  ? ?  ?Cognition Arousal/Alertness: Awake/alert ?  ?  ?  ?  ?  ?  ?  ?  ?  ?  ?  ?  ?  ?  ?  ?  ?  ?  ?  ?  ?  ? ?  ?Exercises Total Joint Exercises ?Ankle Circles/Pumps: AROM, Both, 10 reps ?Quad Sets: AROM, Both, 10 reps ?Heel Slides: AROM, Left, 10 reps ?Hip ABduction/ADduction: AROM, Left, 10 reps ?Straight Leg Raises: AROM, Left, 10 reps ?Long Arc Quad: AROM, Left, 10 reps ?Knee Flexion: AROM, Left, 10 reps ?Goniometric ROM: 5-70 left knee flex ? ?  ?General Comments   ?  ?  ? ?Pertinent Vitals/Pain Pain Assessment ?Pain Score: 3  ?Pain Location: left knee ?Pain Descriptors / Indicators: Discomfort ?Pain Intervention(s): Premedicated before session, Monitored during session  ? ? ?Home Living   ?  ?  ?  ?  ?  ?  ?  ?  ?  ?   ?  ?Prior Function    ?  ?  ?   ? ?PT Goals (current goals can now be found in the care plan section)  Progress towards PT goals: Progressing toward goals ? ?  ?Frequency ? ? ? 7X/week ? ? ? ?  ?PT Plan Current plan remains appropriate  ? ? ?Co-evaluation   ?  ?  ?  ?  ? ?  ?AM-PAC PT "6 Clicks" Mobility   ?Outcome Measure ? Help needed turning from your back to your side while in a flat bed without using bedrails?: None ?Help needed moving from lying on your back to sitting on the side of a flat bed without using bedrails?: None ?Help needed moving to and from a bed to a chair (including a wheelchair)?: A Little ?Help needed standing up from a chair using your arms (e.g., wheelchair or bedside chair)?: A Little ?Help needed to walk in hospital room?: A Little ?Help needed climbing 3-5 steps with a railing? : A Little ?6 Click Score: 20 ? ?   ?End of Session Equipment Utilized During Treatment: Gait belt ?Activity Tolerance: Patient tolerated treatment well ?Patient left: in chair;with call bell/phone within reach;with family/visitor present ?Nurse Communication: Mobility status ?PT Visit Diagnosis: Unsteadiness on feet (R26.81);Difficulty in walking, not elsewhere classified (R26.2);Dizziness and giddiness (R42) ?  ? ? ?Time: [phone] ?PT Time Calculation (min) (ACUTE ONLY): 33 min ? ?Charges:  $Gait Training: 8-22 mins ?$Therapeutic Exercise: 8-22 mins          ?          ? ?Tresa Endo PT ?Acute Rehabilitation Services ?Pager [phone] ?Office [phone] ? ? ? ?Kynedi Profitt, Shella Maxim ?07/02/2021, 12:40 PM ? ?

## 2021-07-02 NOTE — Plan of Care (Signed)
  Problem: Activity: Goal: Ability to avoid complications of mobility impairment will improve Outcome: Progressing   Problem: Clinical Measurements: Goal: Postoperative complications will be avoided or minimized Outcome: Progressing   Problem: Pain Management: Goal: Pain level will decrease with appropriate interventions Outcome: Progressing   

## 2021-07-02 NOTE — TOC Transition Note (Signed)
Transition of Care (TOC) - CM/SW Discharge Note ? ? ?Patient Details  ?Name: Caleb Barton ?MRN: [id number] ?Date of Birth: 1940/10/17 ? ?Transition of Care (TOC) CM/SW Contact:  ?Nohelia Valenza, LCSW ?Phone Number: ?07/02/2021, 9:35 AM ? ? ?Clinical Narrative:    ?Met with pt and confirming he has all needed DME at home.  Pt has HHPT prearranged with [facility].  No TOC needs. ? ? ?Final next level of care: [place] ?Barriers to Discharge: No Barriers Identified ? ? ?Patient Goals and CMS Choice ?Patient states their goals for this hospitalization and ongoing recovery are:: return home ?  ?  ? ?Discharge Placement ?  ?           ?  ?  ?  ?  ? ?Discharge Plan and Services ?  ?  ?           ?DME Arranged: N/A ?DME Agency: NA ?  ?  ?  ?HH Arranged: PT ?[place] Agency: [facility] ?  ?  ?  ? ?Social Determinants of Health (SDOH) Interventions ?  ? ? ?Readmission Risk Interventions ?No flowsheet data found. ? ? ? ? ?

## 2021-07-02 NOTE — Progress Notes (Signed)
? ? ? ?  Subjective: ? ?Patient reports pain as well controlled. Denies distal n/t. Worked well with PT yesterday afternoon. Eager to go home later today..   ? ?Objective:  ? ?VITALS:   ?Vitals:  ? 07/01/21 1929 07/01/21 2114 07/02/21 0112 07/02/21 0513  ?BP:  117/79 108/76 118/74  ?Pulse:  70 66 60  ?Resp:  '16 16 16  '$ ?Temp:  98 ?F (36.7 ?C) 98 ?F (36.7 ?C) 98.1 ?F (36.7 ?C)  ?TempSrc:  Oral Oral Oral  ?SpO2:  95% 95% 94%  ?Weight: 112.6 kg     ?Height: '5\' 9"'$  (1.753 m)     ? ? ?Sensation intact distally ?Intact pulses distally ?Dorsiflexion/Plantar flexion intact ?Incision: dressing C/D/I ?Compartment soft ? ? ?Lab Results  ?Component Value Date  ? WBC 9.6 07/02/2021  ? HGB 12.7 (L) 07/02/2021  ? HCT 37.4 (L) 07/02/2021  ? MCV 95.7 07/02/2021  ? PLT 169 07/02/2021  ? ?BMET ?   ?Component Value Date/Time  ? NA 137 07/02/2021 0306  ? NA 143 07/27/2019 1513  ? K 3.8 07/02/2021 0306  ? CL 102 07/02/2021 0306  ? CO2 26 07/02/2021 0306  ? GLUCOSE 128 (H) 07/02/2021 0306  ? BUN 13 07/02/2021 0306  ? BUN 11 07/27/2019 1513  ? CREATININE 0.91 07/02/2021 0306  ? CALCIUM 8.3 (L) 07/02/2021 0306  ? GFRNONAA >60 07/02/2021 0306  ? ? ? ? ?Xray: components in good position, no fx no adverse features ? ?Assessment/Plan: ?1 Day Post-Op  ? ?Principal Problem: ?  Localized osteoarthritis of left knee ? ?S/p L TKA ? ?Post op recs: ?WB: WBAT ?Abx: ancef x23 hours post op ?Imaging: PACU xrays ?DVT prophylaxis: Xarelto '10mg'$  POD1-2 then resume Xarelto '20mg'$  POD3 ?Follow up: 2 weeks after surgery for a wound check with Dr. Zachery Dakins at [facility].  ?Address: [address] [place], [place], [place] [address]  ?Office Phone: [phone] ? ? ? ?Caleb Barton ?07/02/2021, 7:32 AM ? ? ?Charlies Constable, MD ? ?Contact information:   ?Weekdays 7am-5pm epic message Dr. Zachery Dakins, or call office for patient follow up: [phone] ?After hours and holidays please check [email or web address] for group call information for [facility] ? ?  ?

## 2021-07-02 NOTE — Progress Notes (Signed)
Pt. Had irregular pulses running highs to 100 and lows to 43. Pt. Has history of A-Fib.Pt. is asymptomatic, denies any SOB, chest pain. Rechecked manually, provider notified, Advised to Discharge home.  ?

## 2021-07-03 ENCOUNTER — Encounter (HOSPITAL_COMMUNITY): Payer: Self-pay | Admitting: Orthopedic Surgery

## 2021-07-03 DIAGNOSIS — Z471 Aftercare following joint replacement surgery: Secondary | ICD-10-CM | POA: Diagnosis not present

## 2021-07-03 DIAGNOSIS — E7800 Pure hypercholesterolemia, unspecified: Secondary | ICD-10-CM | POA: Diagnosis not present

## 2021-07-03 DIAGNOSIS — Z4789 Encounter for other orthopedic aftercare: Secondary | ICD-10-CM | POA: Diagnosis not present

## 2021-07-03 DIAGNOSIS — G4730 Sleep apnea, unspecified: Secondary | ICD-10-CM | POA: Diagnosis not present

## 2021-07-03 DIAGNOSIS — I10 Essential (primary) hypertension: Secondary | ICD-10-CM | POA: Diagnosis not present

## 2021-07-03 DIAGNOSIS — Z7901 Long term (current) use of anticoagulants: Secondary | ICD-10-CM | POA: Diagnosis not present

## 2021-07-03 DIAGNOSIS — H7322 Unspecified myringitis, left ear: Secondary | ICD-10-CM | POA: Diagnosis not present

## 2021-07-03 DIAGNOSIS — I4510 Unspecified right bundle-branch block: Secondary | ICD-10-CM | POA: Diagnosis not present

## 2021-07-03 DIAGNOSIS — Z96653 Presence of artificial knee joint, bilateral: Secondary | ICD-10-CM | POA: Diagnosis not present

## 2021-07-03 DIAGNOSIS — M2570 Osteophyte, unspecified joint: Secondary | ICD-10-CM | POA: Diagnosis not present

## 2021-07-03 DIAGNOSIS — H903 Sensorineural hearing loss, bilateral: Secondary | ICD-10-CM | POA: Diagnosis not present

## 2021-07-06 DIAGNOSIS — I10 Essential (primary) hypertension: Secondary | ICD-10-CM | POA: Diagnosis not present

## 2021-07-06 DIAGNOSIS — M2570 Osteophyte, unspecified joint: Secondary | ICD-10-CM | POA: Diagnosis not present

## 2021-07-06 DIAGNOSIS — Z471 Aftercare following joint replacement surgery: Secondary | ICD-10-CM | POA: Diagnosis not present

## 2021-07-06 DIAGNOSIS — Z96653 Presence of artificial knee joint, bilateral: Secondary | ICD-10-CM | POA: Diagnosis not present

## 2021-07-06 DIAGNOSIS — G4730 Sleep apnea, unspecified: Secondary | ICD-10-CM | POA: Diagnosis not present

## 2021-07-06 DIAGNOSIS — Z7901 Long term (current) use of anticoagulants: Secondary | ICD-10-CM | POA: Diagnosis not present

## 2021-07-06 DIAGNOSIS — E7800 Pure hypercholesterolemia, unspecified: Secondary | ICD-10-CM | POA: Diagnosis not present

## 2021-07-06 DIAGNOSIS — H7322 Unspecified myringitis, left ear: Secondary | ICD-10-CM | POA: Diagnosis not present

## 2021-07-06 DIAGNOSIS — I4510 Unspecified right bundle-branch block: Secondary | ICD-10-CM | POA: Diagnosis not present

## 2021-07-06 DIAGNOSIS — H903 Sensorineural hearing loss, bilateral: Secondary | ICD-10-CM | POA: Diagnosis not present

## 2021-07-09 DIAGNOSIS — I4510 Unspecified right bundle-branch block: Secondary | ICD-10-CM | POA: Diagnosis not present

## 2021-07-09 DIAGNOSIS — G4730 Sleep apnea, unspecified: Secondary | ICD-10-CM | POA: Diagnosis not present

## 2021-07-09 DIAGNOSIS — I10 Essential (primary) hypertension: Secondary | ICD-10-CM | POA: Diagnosis not present

## 2021-07-09 DIAGNOSIS — Z7901 Long term (current) use of anticoagulants: Secondary | ICD-10-CM | POA: Diagnosis not present

## 2021-07-09 DIAGNOSIS — E7800 Pure hypercholesterolemia, unspecified: Secondary | ICD-10-CM | POA: Diagnosis not present

## 2021-07-09 DIAGNOSIS — Z96653 Presence of artificial knee joint, bilateral: Secondary | ICD-10-CM | POA: Diagnosis not present

## 2021-07-09 DIAGNOSIS — H903 Sensorineural hearing loss, bilateral: Secondary | ICD-10-CM | POA: Diagnosis not present

## 2021-07-09 DIAGNOSIS — M2570 Osteophyte, unspecified joint: Secondary | ICD-10-CM | POA: Diagnosis not present

## 2021-07-09 DIAGNOSIS — H7322 Unspecified myringitis, left ear: Secondary | ICD-10-CM | POA: Diagnosis not present

## 2021-07-09 DIAGNOSIS — Z471 Aftercare following joint replacement surgery: Secondary | ICD-10-CM | POA: Diagnosis not present

## 2021-07-11 DIAGNOSIS — M1712 Unilateral primary osteoarthritis, left knee: Secondary | ICD-10-CM | POA: Diagnosis not present

## 2021-07-12 DIAGNOSIS — Z96653 Presence of artificial knee joint, bilateral: Secondary | ICD-10-CM | POA: Diagnosis not present

## 2021-07-12 DIAGNOSIS — H903 Sensorineural hearing loss, bilateral: Secondary | ICD-10-CM | POA: Diagnosis not present

## 2021-07-12 DIAGNOSIS — E7800 Pure hypercholesterolemia, unspecified: Secondary | ICD-10-CM | POA: Diagnosis not present

## 2021-07-12 DIAGNOSIS — M2570 Osteophyte, unspecified joint: Secondary | ICD-10-CM | POA: Diagnosis not present

## 2021-07-12 DIAGNOSIS — I10 Essential (primary) hypertension: Secondary | ICD-10-CM | POA: Diagnosis not present

## 2021-07-12 DIAGNOSIS — Z471 Aftercare following joint replacement surgery: Secondary | ICD-10-CM | POA: Diagnosis not present

## 2021-07-12 DIAGNOSIS — I4510 Unspecified right bundle-branch block: Secondary | ICD-10-CM | POA: Diagnosis not present

## 2021-07-12 DIAGNOSIS — H7322 Unspecified myringitis, left ear: Secondary | ICD-10-CM | POA: Diagnosis not present

## 2021-07-12 DIAGNOSIS — Z7901 Long term (current) use of anticoagulants: Secondary | ICD-10-CM | POA: Diagnosis not present

## 2021-07-12 DIAGNOSIS — G4730 Sleep apnea, unspecified: Secondary | ICD-10-CM | POA: Diagnosis not present

## 2021-07-13 DIAGNOSIS — Z96653 Presence of artificial knee joint, bilateral: Secondary | ICD-10-CM | POA: Diagnosis not present

## 2021-07-13 DIAGNOSIS — E7800 Pure hypercholesterolemia, unspecified: Secondary | ICD-10-CM | POA: Diagnosis not present

## 2021-07-13 DIAGNOSIS — M2570 Osteophyte, unspecified joint: Secondary | ICD-10-CM | POA: Diagnosis not present

## 2021-07-13 DIAGNOSIS — I10 Essential (primary) hypertension: Secondary | ICD-10-CM | POA: Diagnosis not present

## 2021-07-13 DIAGNOSIS — I4510 Unspecified right bundle-branch block: Secondary | ICD-10-CM | POA: Diagnosis not present

## 2021-07-13 DIAGNOSIS — H7322 Unspecified myringitis, left ear: Secondary | ICD-10-CM | POA: Diagnosis not present

## 2021-07-13 DIAGNOSIS — Z7901 Long term (current) use of anticoagulants: Secondary | ICD-10-CM | POA: Diagnosis not present

## 2021-07-13 DIAGNOSIS — H903 Sensorineural hearing loss, bilateral: Secondary | ICD-10-CM | POA: Diagnosis not present

## 2021-07-13 DIAGNOSIS — Z471 Aftercare following joint replacement surgery: Secondary | ICD-10-CM | POA: Diagnosis not present

## 2021-07-13 DIAGNOSIS — G4730 Sleep apnea, unspecified: Secondary | ICD-10-CM | POA: Diagnosis not present

## 2021-07-14 DIAGNOSIS — I10 Essential (primary) hypertension: Secondary | ICD-10-CM | POA: Diagnosis not present

## 2021-07-14 DIAGNOSIS — M2570 Osteophyte, unspecified joint: Secondary | ICD-10-CM | POA: Diagnosis not present

## 2021-07-14 DIAGNOSIS — G4730 Sleep apnea, unspecified: Secondary | ICD-10-CM | POA: Diagnosis not present

## 2021-07-14 DIAGNOSIS — Z7901 Long term (current) use of anticoagulants: Secondary | ICD-10-CM | POA: Diagnosis not present

## 2021-07-14 DIAGNOSIS — H903 Sensorineural hearing loss, bilateral: Secondary | ICD-10-CM | POA: Diagnosis not present

## 2021-07-14 DIAGNOSIS — Z96653 Presence of artificial knee joint, bilateral: Secondary | ICD-10-CM | POA: Diagnosis not present

## 2021-07-14 DIAGNOSIS — I4510 Unspecified right bundle-branch block: Secondary | ICD-10-CM | POA: Diagnosis not present

## 2021-07-14 DIAGNOSIS — Z471 Aftercare following joint replacement surgery: Secondary | ICD-10-CM | POA: Diagnosis not present

## 2021-07-14 DIAGNOSIS — H7322 Unspecified myringitis, left ear: Secondary | ICD-10-CM | POA: Diagnosis not present

## 2021-07-14 DIAGNOSIS — E7800 Pure hypercholesterolemia, unspecified: Secondary | ICD-10-CM | POA: Diagnosis not present

## 2021-07-15 DIAGNOSIS — M1712 Unilateral primary osteoarthritis, left knee: Secondary | ICD-10-CM | POA: Diagnosis not present

## 2021-07-15 DIAGNOSIS — M6281 Muscle weakness (generalized): Secondary | ICD-10-CM | POA: Diagnosis not present

## 2021-07-15 DIAGNOSIS — Z96652 Presence of left artificial knee joint: Secondary | ICD-10-CM | POA: Diagnosis not present

## 2021-07-15 DIAGNOSIS — R262 Difficulty in walking, not elsewhere classified: Secondary | ICD-10-CM | POA: Diagnosis not present

## 2021-07-15 DIAGNOSIS — M25662 Stiffness of left knee, not elsewhere classified: Secondary | ICD-10-CM | POA: Diagnosis not present

## 2021-07-19 DIAGNOSIS — M25662 Stiffness of left knee, not elsewhere classified: Secondary | ICD-10-CM | POA: Diagnosis not present

## 2021-07-19 DIAGNOSIS — Z96652 Presence of left artificial knee joint: Secondary | ICD-10-CM | POA: Diagnosis not present

## 2021-07-19 DIAGNOSIS — R262 Difficulty in walking, not elsewhere classified: Secondary | ICD-10-CM | POA: Diagnosis not present

## 2021-07-19 DIAGNOSIS — M1712 Unilateral primary osteoarthritis, left knee: Secondary | ICD-10-CM | POA: Diagnosis not present

## 2021-07-19 DIAGNOSIS — M6281 Muscle weakness (generalized): Secondary | ICD-10-CM | POA: Diagnosis not present

## 2021-07-22 DIAGNOSIS — M1712 Unilateral primary osteoarthritis, left knee: Secondary | ICD-10-CM | POA: Diagnosis not present

## 2021-07-22 DIAGNOSIS — Z96652 Presence of left artificial knee joint: Secondary | ICD-10-CM | POA: Diagnosis not present

## 2021-07-22 DIAGNOSIS — R262 Difficulty in walking, not elsewhere classified: Secondary | ICD-10-CM | POA: Diagnosis not present

## 2021-07-22 DIAGNOSIS — M25662 Stiffness of left knee, not elsewhere classified: Secondary | ICD-10-CM | POA: Diagnosis not present

## 2021-07-22 DIAGNOSIS — M6281 Muscle weakness (generalized): Secondary | ICD-10-CM | POA: Diagnosis not present

## 2021-07-24 DIAGNOSIS — M25662 Stiffness of left knee, not elsewhere classified: Secondary | ICD-10-CM | POA: Diagnosis not present

## 2021-07-24 DIAGNOSIS — R262 Difficulty in walking, not elsewhere classified: Secondary | ICD-10-CM | POA: Diagnosis not present

## 2021-07-24 DIAGNOSIS — Z96652 Presence of left artificial knee joint: Secondary | ICD-10-CM | POA: Diagnosis not present

## 2021-07-24 DIAGNOSIS — M6281 Muscle weakness (generalized): Secondary | ICD-10-CM | POA: Diagnosis not present

## 2021-07-24 DIAGNOSIS — M1712 Unilateral primary osteoarthritis, left knee: Secondary | ICD-10-CM | POA: Diagnosis not present

## 2021-07-30 DIAGNOSIS — R262 Difficulty in walking, not elsewhere classified: Secondary | ICD-10-CM | POA: Diagnosis not present

## 2021-07-30 DIAGNOSIS — Z96652 Presence of left artificial knee joint: Secondary | ICD-10-CM | POA: Diagnosis not present

## 2021-07-30 DIAGNOSIS — M25662 Stiffness of left knee, not elsewhere classified: Secondary | ICD-10-CM | POA: Diagnosis not present

## 2021-07-30 DIAGNOSIS — M1712 Unilateral primary osteoarthritis, left knee: Secondary | ICD-10-CM | POA: Diagnosis not present

## 2021-07-30 DIAGNOSIS — M6281 Muscle weakness (generalized): Secondary | ICD-10-CM | POA: Diagnosis not present

## 2021-07-31 DIAGNOSIS — H0288A Meibomian gland dysfunction right eye, upper and lower eyelids: Secondary | ICD-10-CM | POA: Diagnosis not present

## 2021-07-31 DIAGNOSIS — H0288B Meibomian gland dysfunction left eye, upper and lower eyelids: Secondary | ICD-10-CM | POA: Diagnosis not present

## 2021-07-31 DIAGNOSIS — H401131 Primary open-angle glaucoma, bilateral, mild stage: Secondary | ICD-10-CM | POA: Diagnosis not present

## 2021-07-31 DIAGNOSIS — H16223 Keratoconjunctivitis sicca, not specified as Sjogren's, bilateral: Secondary | ICD-10-CM | POA: Diagnosis not present

## 2021-08-01 DIAGNOSIS — M1712 Unilateral primary osteoarthritis, left knee: Secondary | ICD-10-CM | POA: Diagnosis not present

## 2021-08-01 DIAGNOSIS — R262 Difficulty in walking, not elsewhere classified: Secondary | ICD-10-CM | POA: Diagnosis not present

## 2021-08-01 DIAGNOSIS — M5416 Radiculopathy, lumbar region: Secondary | ICD-10-CM | POA: Diagnosis not present

## 2021-08-01 DIAGNOSIS — M6281 Muscle weakness (generalized): Secondary | ICD-10-CM | POA: Diagnosis not present

## 2021-08-01 DIAGNOSIS — Z96652 Presence of left artificial knee joint: Secondary | ICD-10-CM | POA: Diagnosis not present

## 2021-08-01 DIAGNOSIS — M25662 Stiffness of left knee, not elsewhere classified: Secondary | ICD-10-CM | POA: Diagnosis not present

## 2021-08-06 DIAGNOSIS — G4733 Obstructive sleep apnea (adult) (pediatric): Secondary | ICD-10-CM | POA: Diagnosis not present

## 2021-08-06 DIAGNOSIS — M6281 Muscle weakness (generalized): Secondary | ICD-10-CM | POA: Diagnosis not present

## 2021-08-06 DIAGNOSIS — R262 Difficulty in walking, not elsewhere classified: Secondary | ICD-10-CM | POA: Diagnosis not present

## 2021-08-06 DIAGNOSIS — Z96652 Presence of left artificial knee joint: Secondary | ICD-10-CM | POA: Diagnosis not present

## 2021-08-06 DIAGNOSIS — M25662 Stiffness of left knee, not elsewhere classified: Secondary | ICD-10-CM | POA: Diagnosis not present

## 2021-08-06 DIAGNOSIS — M1712 Unilateral primary osteoarthritis, left knee: Secondary | ICD-10-CM | POA: Diagnosis not present

## 2021-08-07 DIAGNOSIS — M545 Low back pain, unspecified: Secondary | ICD-10-CM | POA: Diagnosis not present

## 2021-08-07 DIAGNOSIS — M5416 Radiculopathy, lumbar region: Secondary | ICD-10-CM | POA: Diagnosis not present

## 2021-08-08 DIAGNOSIS — Z96652 Presence of left artificial knee joint: Secondary | ICD-10-CM | POA: Diagnosis not present

## 2021-08-08 DIAGNOSIS — M1712 Unilateral primary osteoarthritis, left knee: Secondary | ICD-10-CM | POA: Diagnosis not present

## 2021-08-08 DIAGNOSIS — R262 Difficulty in walking, not elsewhere classified: Secondary | ICD-10-CM | POA: Diagnosis not present

## 2021-08-08 DIAGNOSIS — M25662 Stiffness of left knee, not elsewhere classified: Secondary | ICD-10-CM | POA: Diagnosis not present

## 2021-08-08 DIAGNOSIS — M6281 Muscle weakness (generalized): Secondary | ICD-10-CM | POA: Diagnosis not present

## 2021-08-13 DIAGNOSIS — M6281 Muscle weakness (generalized): Secondary | ICD-10-CM | POA: Diagnosis not present

## 2021-08-13 DIAGNOSIS — M1712 Unilateral primary osteoarthritis, left knee: Secondary | ICD-10-CM | POA: Diagnosis not present

## 2021-08-13 DIAGNOSIS — M25662 Stiffness of left knee, not elsewhere classified: Secondary | ICD-10-CM | POA: Diagnosis not present

## 2021-08-13 DIAGNOSIS — Z96652 Presence of left artificial knee joint: Secondary | ICD-10-CM | POA: Diagnosis not present

## 2021-08-13 DIAGNOSIS — R262 Difficulty in walking, not elsewhere classified: Secondary | ICD-10-CM | POA: Diagnosis not present

## 2021-08-14 DIAGNOSIS — N401 Enlarged prostate with lower urinary tract symptoms: Secondary | ICD-10-CM | POA: Diagnosis not present

## 2021-08-14 DIAGNOSIS — E785 Hyperlipidemia, unspecified: Secondary | ICD-10-CM | POA: Diagnosis not present

## 2021-08-14 DIAGNOSIS — I10 Essential (primary) hypertension: Secondary | ICD-10-CM | POA: Diagnosis not present

## 2021-08-14 DIAGNOSIS — J441 Chronic obstructive pulmonary disease with (acute) exacerbation: Secondary | ICD-10-CM | POA: Diagnosis not present

## 2021-08-15 DIAGNOSIS — M5416 Radiculopathy, lumbar region: Secondary | ICD-10-CM | POA: Diagnosis not present

## 2021-08-15 DIAGNOSIS — M6281 Muscle weakness (generalized): Secondary | ICD-10-CM | POA: Diagnosis not present

## 2021-08-15 DIAGNOSIS — M1712 Unilateral primary osteoarthritis, left knee: Secondary | ICD-10-CM | POA: Diagnosis not present

## 2021-08-15 DIAGNOSIS — Z6835 Body mass index (BMI) 35.0-35.9, adult: Secondary | ICD-10-CM | POA: Diagnosis not present

## 2021-08-15 DIAGNOSIS — M25662 Stiffness of left knee, not elsewhere classified: Secondary | ICD-10-CM | POA: Diagnosis not present

## 2021-08-15 DIAGNOSIS — R262 Difficulty in walking, not elsewhere classified: Secondary | ICD-10-CM | POA: Diagnosis not present

## 2021-08-15 DIAGNOSIS — Z96652 Presence of left artificial knee joint: Secondary | ICD-10-CM | POA: Diagnosis not present

## 2021-08-19 DIAGNOSIS — Z96652 Presence of left artificial knee joint: Secondary | ICD-10-CM | POA: Diagnosis not present

## 2021-08-19 DIAGNOSIS — M25662 Stiffness of left knee, not elsewhere classified: Secondary | ICD-10-CM | POA: Diagnosis not present

## 2021-08-19 DIAGNOSIS — M1712 Unilateral primary osteoarthritis, left knee: Secondary | ICD-10-CM | POA: Diagnosis not present

## 2021-08-19 DIAGNOSIS — R262 Difficulty in walking, not elsewhere classified: Secondary | ICD-10-CM | POA: Diagnosis not present

## 2021-08-19 DIAGNOSIS — M6281 Muscle weakness (generalized): Secondary | ICD-10-CM | POA: Diagnosis not present

## 2021-08-22 DIAGNOSIS — M6281 Muscle weakness (generalized): Secondary | ICD-10-CM | POA: Diagnosis not present

## 2021-08-22 DIAGNOSIS — R262 Difficulty in walking, not elsewhere classified: Secondary | ICD-10-CM | POA: Diagnosis not present

## 2021-08-22 DIAGNOSIS — Z96652 Presence of left artificial knee joint: Secondary | ICD-10-CM | POA: Diagnosis not present

## 2021-08-22 DIAGNOSIS — M1712 Unilateral primary osteoarthritis, left knee: Secondary | ICD-10-CM | POA: Diagnosis not present

## 2021-08-22 DIAGNOSIS — M25662 Stiffness of left knee, not elsewhere classified: Secondary | ICD-10-CM | POA: Diagnosis not present

## 2021-08-26 DIAGNOSIS — M1712 Unilateral primary osteoarthritis, left knee: Secondary | ICD-10-CM | POA: Diagnosis not present

## 2021-08-26 DIAGNOSIS — M6281 Muscle weakness (generalized): Secondary | ICD-10-CM | POA: Diagnosis not present

## 2021-08-26 DIAGNOSIS — R262 Difficulty in walking, not elsewhere classified: Secondary | ICD-10-CM | POA: Diagnosis not present

## 2021-08-26 DIAGNOSIS — Z96652 Presence of left artificial knee joint: Secondary | ICD-10-CM | POA: Diagnosis not present

## 2021-08-26 DIAGNOSIS — M25662 Stiffness of left knee, not elsewhere classified: Secondary | ICD-10-CM | POA: Diagnosis not present

## 2021-09-03 DIAGNOSIS — M5416 Radiculopathy, lumbar region: Secondary | ICD-10-CM | POA: Diagnosis not present

## 2021-09-05 DIAGNOSIS — G4733 Obstructive sleep apnea (adult) (pediatric): Secondary | ICD-10-CM | POA: Diagnosis not present

## 2021-09-17 DIAGNOSIS — M1712 Unilateral primary osteoarthritis, left knee: Secondary | ICD-10-CM | POA: Diagnosis not present

## 2021-09-18 ENCOUNTER — Telehealth: Payer: Self-pay

## 2021-09-18 NOTE — Telephone Encounter (Signed)
Clinical pharmacist to review Xarelto 

## 2021-09-18 NOTE — Telephone Encounter (Signed)
   Pre-operative Risk Assessment    Patient Name: Caleb Barton  DOB: 01/03/1941 MRN: [id number]      Request for Surgical Clearance    Procedure:   Transforaminal Epidural Steroid Injection  Date of Surgery:  Clearance 09/24/21                                 Surgeon:  Dr. Davy Pique  Surgeon's Group or Practice Name:  [facility] Phone number:  [phone] Fax number:  [phone]   Type of Clearance Requested:   - Medical  - Pharmacy:  Hold Rivaroxaban (Xarelto) 3 days prior and restart the day after    Type of Anesthesia:  Not Indicated   Additional requests/questions:    Tana Conch   09/18/2021, 3:15 PM

## 2021-09-18 NOTE — Telephone Encounter (Signed)
Please set up telephone virtual visit for this Friday

## 2021-09-18 NOTE — Telephone Encounter (Signed)
Left message for the pt to call back ASAP to schedule a tele pre op appt for 09/20/21. STAT clearance request came in. Per pre op provider to add pt to Friday 09/20/21 schedule,

## 2021-09-19 ENCOUNTER — Telehealth: Payer: Self-pay | Admitting: *Deleted

## 2021-09-19 NOTE — Telephone Encounter (Signed)
Pt has been scheduled for tel pre op appt 09/20/21 @ 1:20 per pre op provider to add to tomorrow schedule. Med rec and consent are done.

## 2021-09-19 NOTE — Telephone Encounter (Signed)
Patient with diagnosis of Afib on Xarelto for anticoagulation.    Procedure: Transforaminal Epidural Steroid Injection Date of procedure: 09/24/2021  CHA2DS2-VASc Score = 6  This indicates a 9.7% annual risk of stroke. The patient's score is based upon: CHF History: 0 HTN History: 1 Diabetes History: 0 Stroke History: 2 Vascular Disease History: 1 Age Score: 2 Gender Score: 0   CrCl 68 mL/min using AdjBW Platelet count 196K  Per office protocol, patient can hold Xarelto for 3 days prior to procedure.  Patient has previously been cleared by MD earlier this year to hold [place] for 3 days prior to spinal injections. Resume anticoag as soon as safely possible after procedure given elevated CV risk.

## 2021-09-19 NOTE — Telephone Encounter (Signed)
Patient returning call to schedule pre-op appt

## 2021-09-19 NOTE — Telephone Encounter (Signed)
Pt has been scheduled for tel pre op appt 09/20/21 @ 1:20 per pre op provider to add to tomorrow schedule. Med rec and consent are done.     Patient Consent for Virtual Visit        Caleb Barton has provided verbal consent on 09/19/2021 for a virtual visit (video or telephone).   CONSENT FOR VIRTUAL VISIT FOR:  Caleb Barton  By participating in this virtual visit I agree to the following:  I hereby voluntarily request, consent and authorize [facility] and its employed or contracted physicians, physician assistants, nurse practitioners or other licensed health care professionals (the Practitioner), to provide me with telemedicine health care services (the "Services") as deemed necessary by the treating Practitioner. I acknowledge and consent to receive the Services by the Practitioner via telemedicine. I understand that the telemedicine visit will involve communicating with the Practitioner through live audiovisual communication technology and the disclosure of certain medical information by electronic transmission. I acknowledge that I have been given the opportunity to request an in-person assessment or other available alternative prior to the telemedicine visit and am voluntarily participating in the telemedicine visit.  I understand that I have the right to withhold or withdraw my consent to the use of telemedicine in the course of my care at any time, without affecting my right to future care or treatment, and that the Practitioner or I may terminate the telemedicine visit at any time. I understand that I have the right to inspect all information obtained and/or recorded in the course of the telemedicine visit and may receive copies of available information for a reasonable fee.  I understand that some of the potential risks of receiving the Services via telemedicine include:  Delay or interruption in medical evaluation due to technological equipment failure or disruption; Information  transmitted may not be sufficient (e.g. poor resolution of images) to allow for appropriate medical decision making by the Practitioner; and/or  In rare instances, security protocols could fail, causing a breach of personal health information.  Furthermore, I acknowledge that it is my responsibility to provide information about my medical history, conditions and care that is complete and accurate to the best of my ability. I acknowledge that Practitioner's advice, recommendations, and/or decision may be based on factors not within their control, such as incomplete or inaccurate data provided by me or distortions of diagnostic images or specimens that may result from electronic transmissions. I understand that the practice of medicine is not an exact science and that Practitioner makes no warranties or guarantees regarding treatment outcomes. I acknowledge that a copy of this consent can be made available to me via my patient portal ([facility]), or I can request a printed copy by calling the office of [facility].    I understand that my insurance will be billed for this visit.   I have read or had this consent read to me. I understand the contents of this consent, which adequately explains the benefits and risks of the Services being provided via telemedicine.  I have been provided ample opportunity to ask questions regarding this consent and the Services and have had my questions answered to my satisfaction. I give my informed consent for the services to be provided through the use of telemedicine in my medical care

## 2021-09-20 ENCOUNTER — Ambulatory Visit (INDEPENDENT_AMBULATORY_CARE_PROVIDER_SITE_OTHER): Payer: Medicare HMO | Admitting: Nurse Practitioner

## 2021-09-20 ENCOUNTER — Encounter: Payer: Self-pay | Admitting: Nurse Practitioner

## 2021-09-20 DIAGNOSIS — Z01810 Encounter for preprocedural cardiovascular examination: Secondary | ICD-10-CM | POA: Diagnosis not present

## 2021-09-20 NOTE — Progress Notes (Signed)
Virtual Visit via Telephone Note   Because of Lamonta Cypress Voorheis's co-morbid illnesses, he is at least at moderate risk for complications without adequate follow up.  This format is felt to be most appropriate for this patient at this time.  The patient did not have access to video technology/had technical difficulties with video requiring transitioning to audio format only (telephone).  All issues noted in this document were discussed and addressed.  No physical exam could be performed with this format.  Please refer to the patient's chart for his consent to telehealth for [facility].  Evaluation Performed:  Preoperative cardiovascular risk assessment _____________   Date:  09/20/2021   Patient ID:  Caleb Barton, DOB 09-25-1940, MRN [id number] Patient Location:  Home Provider location:   Office  Primary Care Provider:  Glenis Smoker, MD Primary Cardiologist:  Peter Martinique, MD  Chief Complaint / Patient Profile   81 y.o. 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  Past Medical History    Past Medical History:  Diagnosis Date   Cancer ([place])    precancerous skin cells reomved from face   Dyspnea    Dysrhythmia    RBBB   Hypercholesteremia    takes Lipitor daily and niacin daily   Hypertension    takes HCTZ daily and losartan daily   Prostate cancer ([place])    Right knee DJD    Sleep apnea    cpap;sleep study done at home;request report from dr.gates   Stroke [facility])    TIA 15 yrs ago   Past Surgical History:  Procedure Laterality Date   CARDIOVERSION N/A 10/14/2016   Procedure: CARDIOVERSION;  Surgeon: Larey Dresser, MD;  Location: [facility] ENDOSCOPY;  Service: Cardiovascular;  Laterality: N/A;   cataracts both eyes     ioc lens   COLONOSCOPY     with polyps   COLONOSCOPY  08/20/2011   Procedure:  COLONOSCOPY;  Surgeon: Winfield Cunas., MD;  Location: [facility] ENDOSCOPY;  Service: Endoscopy;  Laterality: N/A;   COLONOSCOPY WITH PROPOFOL N/A 12/17/2016   Procedure: COLONOSCOPY WITH PROPOFOL;  Surgeon: Laurence Spates, MD;  Location: [facility] ENDOSCOPY;  Service: Endoscopy;  Laterality: N/A;   EYE SURGERY     laser bilateral eyes   KNEE ARTHROSCOPY  x 2 right knee   LEFT HEART CATH AND CORONARY ANGIOGRAPHY N/A 07/29/2019   Procedure: LEFT HEART CATH AND CORONARY ANGIOGRAPHY;  Surgeon: Wellington Hampshire, MD;  Location: [facility] CV LAB;  Service: Cardiovascular;  Laterality: N/A;   LUMBAR LAMINECTOMY/DECOMPRESSION MICRODISCECTOMY Right 09/09/2012   Procedure: LUMBAR LAMINECTOMY/DECOMPRESSION MICRODISCECTOMY 1 LEVEL;  Surgeon: Eustace Moore, MD;  Location: [facility] NEURO ORS;  Service: Neurosurgery;  Laterality: Right;  LUMBAR LAMINECTOMY/DECOMPRESSION MICRODISCECTOMY 1 LEVEL   TONSILLECTOMY  yrs ago   TOTAL KNEE ARTHROPLASTY  02/09/2012   Procedure: TOTAL KNEE ARTHROPLASTY;  Surgeon: Lorn Junes, MD;  Location: [facility];  Service: Orthopedics;  Laterality: Right;   TOTAL KNEE ARTHROPLASTY Left 07/01/2021   Procedure: TOTAL KNEE ARTHROPLASTY;  Surgeon: Willaim Sheng, MD;  Location: [facility] ORS;  Service: Orthopedics;  Laterality: Left;    Allergies  Allergies  Allergen Reactions   Lisinopril Cough   Sulfa Antibiotics Rash    History of Present Illness    Caleb Barton is a 81 y.o. male who presents via audio/video conferencing  for a telehealth visit today.  Pt was last seen in cardiology clinic on 10/16/2020 by Dr. Martinique.  At that time JASPREET HOLLINGS was doing well feeling well with blood pressure under control.  The patient is now pending procedure as outlined above. Since his last visit, he states that he has been doing fine with the exception of his arthritic back it is giving him problems.  He is quite active and does yard work and is able to participate in normal ADLs without any restrictions or  anginal symptoms.  He is compliant with his medications and reports no bleeding issues with his Xarelto.  Home Medications    Prior to Admission medications   Medication Sig Start Date End Date Taking? Authorizing Provider  amoxicillin (AMOXIL) 250 MG capsule Take 1,000 mg by mouth See admin instructions. Take 1000 mg by mouth 1 hour prior to dental appointments    [provider]  atorvastatin (LIPITOR) 40 MG tablet Take 40 mg by mouth daily.    [provider]  Azelastine HCl 137 MCG/SPRAY SOLN Place 1 puff into both nostrils 2 (two) times daily as needed (allergies). 06/26/20   [provider]  dorzolamide-timolol (COSOPT) 22.3-6.8 MG/ML ophthalmic solution Place 1 drop into both eyes 2 (two) times daily.  09/18/19   [provider]  hydrochlorothiazide (HYDRODIURIL) 25 MG tablet Take 37.5 mg by mouth daily. Patient not taking: Reported on 09/19/2021    [provider]  hydrochlorothiazide (HYDRODIURIL) 25 MG tablet Take 25 mg by mouth daily.    [provider]  latanoprost (XALATAN) 0.005 % ophthalmic solution Place 1 drop into both eyes at bedtime.     [provider]  losartan (COZAAR) 50 MG tablet Take 50 mg by mouth daily.    [provider]  methocarbamol (ROBAXIN) 500 MG tablet Take 500 mg by mouth every 6 (six) hours as needed. 09/17/21   [provider]  Multiple Vitamin (MULTIVITAMIN WITH MINERALS) TABS tablet Take 1 tablet by mouth daily.    [provider]  Potassium 99 MG TABS Take 99 mg by mouth daily.    [provider]  RESTASIS 0.05 % ophthalmic emulsion Place 1 drop into both eyes 2 (two) times daily.  07/07/19   [provider]  rivaroxaban (XARELTO) 20 MG TABS tablet Take 20 mg by mouth daily with supper.    [provider]  tamsulosin (FLOMAX) 0.4 MG CAPS capsule Take 0.4 mg by mouth daily. 06/15/19   [provider]  vitamin C (ASCORBIC ACID) 500 MG  tablet Take 500 mg by mouth daily.    [provider]    Physical Exam    Vital Signs:  CLOYD RAGAS does not have vital signs available for review today.  None  Given telephonic nature of communication, physical exam is limited. AAOx3. NAD. Normal affect.  Speech and respirations are unlabored.  Accessory Clinical Findings    None  Assessment & Plan    1.  Preoperative Cardiovascular Risk Assessment:  Mr. Turnbaugh perioperative risk of a major cardiac event is 0.9% according to the Revised Cardiac Risk Index (RCRI).  Therefore, he is at low risk for perioperative complications.   His functional capacity is good at 6.27 METs according to the Duke Activity Status Index (DASI).  The patient affirms he has been doing well without any new cardiac symptoms. They are able to achieve 6 METS without cardiac limitations. Therefore, based on ACC/AHA guidelines, the patient would be  at acceptable risk for the planned procedure without further cardiovascular testing. The patient was advised that if he develops new symptoms prior to surgery to contact our office to arrange for a follow-up visit, and he verbalized understanding.   Recommendations: According to ACC/AHA guidelines, no further cardiovascular testing needed.  The patient may proceed to surgery at acceptable risk.   Antiplatelet and/or Anticoagulation Recommendations:   Xarelto (Rivaroxaban) can be held for 3 days prior to surgery.  Please resume post op when felt to be safe.     Per office protocol, patient can hold Xarelto for 3 days prior to procedure.  Patient has previously been cleared by MD earlier this year to hold [place] for 3 days prior to spinal injections. Resume anticoag as soon as safely possible after procedure given elevated CV risk.  A copy of this note will be routed to requesting surgeon.  Time:   Today, I have spent 6 minutes with the patient with telehealth technology discussing medical history, symptoms,  and management plan.     Mable Fill, Marissa Nestle, NP  09/20/2021, 7:18 AM

## 2021-09-24 DIAGNOSIS — M5416 Radiculopathy, lumbar region: Secondary | ICD-10-CM | POA: Diagnosis not present

## 2021-09-27 DIAGNOSIS — C61 Malignant neoplasm of prostate: Secondary | ICD-10-CM | POA: Diagnosis not present

## 2021-10-03 DIAGNOSIS — I10 Essential (primary) hypertension: Secondary | ICD-10-CM | POA: Diagnosis not present

## 2021-10-03 DIAGNOSIS — J441 Chronic obstructive pulmonary disease with (acute) exacerbation: Secondary | ICD-10-CM | POA: Diagnosis not present

## 2021-10-03 DIAGNOSIS — E785 Hyperlipidemia, unspecified: Secondary | ICD-10-CM | POA: Diagnosis not present

## 2021-10-03 DIAGNOSIS — N401 Enlarged prostate with lower urinary tract symptoms: Secondary | ICD-10-CM | POA: Diagnosis not present

## 2021-10-06 DIAGNOSIS — G4733 Obstructive sleep apnea (adult) (pediatric): Secondary | ICD-10-CM | POA: Diagnosis not present

## 2021-10-09 DIAGNOSIS — Z8546 Personal history of malignant neoplasm of prostate: Secondary | ICD-10-CM | POA: Diagnosis not present

## 2021-10-09 DIAGNOSIS — J988 Other specified respiratory disorders: Secondary | ICD-10-CM | POA: Diagnosis not present

## 2021-10-09 DIAGNOSIS — N403 Nodular prostate with lower urinary tract symptoms: Secondary | ICD-10-CM | POA: Diagnosis not present

## 2021-10-09 DIAGNOSIS — R351 Nocturia: Secondary | ICD-10-CM | POA: Diagnosis not present

## 2021-10-09 DIAGNOSIS — R059 Cough, unspecified: Secondary | ICD-10-CM | POA: Diagnosis not present

## 2021-10-09 DIAGNOSIS — Z03818 Encounter for observation for suspected exposure to other biological agents ruled out: Secondary | ICD-10-CM | POA: Diagnosis not present

## 2021-10-24 DIAGNOSIS — Z85828 Personal history of other malignant neoplasm of skin: Secondary | ICD-10-CM | POA: Diagnosis not present

## 2021-10-24 DIAGNOSIS — L821 Other seborrheic keratosis: Secondary | ICD-10-CM | POA: Diagnosis not present

## 2021-10-24 DIAGNOSIS — D2271 Melanocytic nevi of right lower limb, including hip: Secondary | ICD-10-CM | POA: Diagnosis not present

## 2021-10-24 DIAGNOSIS — L570 Actinic keratosis: Secondary | ICD-10-CM | POA: Diagnosis not present

## 2021-10-24 DIAGNOSIS — L812 Freckles: Secondary | ICD-10-CM | POA: Diagnosis not present

## 2021-10-24 DIAGNOSIS — D1801 Hemangioma of skin and subcutaneous tissue: Secondary | ICD-10-CM | POA: Diagnosis not present

## 2021-10-29 DIAGNOSIS — M1712 Unilateral primary osteoarthritis, left knee: Secondary | ICD-10-CM | POA: Diagnosis not present

## 2021-10-30 DIAGNOSIS — M5416 Radiculopathy, lumbar region: Secondary | ICD-10-CM | POA: Diagnosis not present

## 2021-10-30 DIAGNOSIS — Z6835 Body mass index (BMI) 35.0-35.9, adult: Secondary | ICD-10-CM | POA: Diagnosis not present

## 2021-11-02 DIAGNOSIS — H6122 Impacted cerumen, left ear: Secondary | ICD-10-CM | POA: Diagnosis not present

## 2021-11-02 DIAGNOSIS — H66012 Acute suppurative otitis media with spontaneous rupture of ear drum, left ear: Secondary | ICD-10-CM | POA: Diagnosis not present

## 2021-11-04 DIAGNOSIS — G4733 Obstructive sleep apnea (adult) (pediatric): Secondary | ICD-10-CM | POA: Diagnosis not present

## 2021-11-06 DIAGNOSIS — H6692 Otitis media, unspecified, left ear: Secondary | ICD-10-CM | POA: Diagnosis not present

## 2021-11-15 DIAGNOSIS — H903 Sensorineural hearing loss, bilateral: Secondary | ICD-10-CM | POA: Diagnosis not present

## 2021-11-15 DIAGNOSIS — H9212 Otorrhea, left ear: Secondary | ICD-10-CM | POA: Diagnosis not present

## 2021-11-17 DIAGNOSIS — Z20822 Contact with and (suspected) exposure to covid-19: Secondary | ICD-10-CM | POA: Diagnosis not present

## 2021-11-18 DIAGNOSIS — Z20822 Contact with and (suspected) exposure to covid-19: Secondary | ICD-10-CM | POA: Diagnosis not present

## 2021-11-21 DIAGNOSIS — Z20822 Contact with and (suspected) exposure to covid-19: Secondary | ICD-10-CM | POA: Diagnosis not present

## 2021-11-22 DIAGNOSIS — Z20822 Contact with and (suspected) exposure to covid-19: Secondary | ICD-10-CM | POA: Diagnosis not present

## 2021-11-25 DIAGNOSIS — H903 Sensorineural hearing loss, bilateral: Secondary | ICD-10-CM | POA: Diagnosis not present

## 2021-11-25 DIAGNOSIS — H60332 Swimmer's ear, left ear: Secondary | ICD-10-CM | POA: Diagnosis not present

## 2021-11-25 DIAGNOSIS — Z20822 Contact with and (suspected) exposure to covid-19: Secondary | ICD-10-CM | POA: Diagnosis not present

## 2021-11-26 DIAGNOSIS — Z20822 Contact with and (suspected) exposure to covid-19: Secondary | ICD-10-CM | POA: Diagnosis not present

## 2021-11-29 DIAGNOSIS — Z20822 Contact with and (suspected) exposure to covid-19: Secondary | ICD-10-CM | POA: Diagnosis not present

## 2021-11-30 DIAGNOSIS — Z20822 Contact with and (suspected) exposure to covid-19: Secondary | ICD-10-CM | POA: Diagnosis not present

## 2021-12-02 ENCOUNTER — Other Ambulatory Visit: Payer: Self-pay | Admitting: Cardiology

## 2021-12-03 DIAGNOSIS — Z20822 Contact with and (suspected) exposure to covid-19: Secondary | ICD-10-CM | POA: Diagnosis not present

## 2021-12-04 DIAGNOSIS — Z20822 Contact with and (suspected) exposure to covid-19: Secondary | ICD-10-CM | POA: Diagnosis not present

## 2021-12-11 DIAGNOSIS — H0288B Meibomian gland dysfunction left eye, upper and lower eyelids: Secondary | ICD-10-CM | POA: Diagnosis not present

## 2021-12-11 DIAGNOSIS — H0288A Meibomian gland dysfunction right eye, upper and lower eyelids: Secondary | ICD-10-CM | POA: Diagnosis not present

## 2021-12-11 DIAGNOSIS — H401131 Primary open-angle glaucoma, bilateral, mild stage: Secondary | ICD-10-CM | POA: Diagnosis not present

## 2021-12-17 DIAGNOSIS — Z6835 Body mass index (BMI) 35.0-35.9, adult: Secondary | ICD-10-CM | POA: Diagnosis not present

## 2021-12-17 DIAGNOSIS — M5416 Radiculopathy, lumbar region: Secondary | ICD-10-CM | POA: Diagnosis not present

## 2021-12-19 DIAGNOSIS — R269 Unspecified abnormalities of gait and mobility: Secondary | ICD-10-CM | POA: Diagnosis not present

## 2021-12-19 DIAGNOSIS — M5416 Radiculopathy, lumbar region: Secondary | ICD-10-CM | POA: Diagnosis not present

## 2021-12-24 DIAGNOSIS — M47816 Spondylosis without myelopathy or radiculopathy, lumbar region: Secondary | ICD-10-CM | POA: Diagnosis not present

## 2021-12-25 DIAGNOSIS — R269 Unspecified abnormalities of gait and mobility: Secondary | ICD-10-CM | POA: Diagnosis not present

## 2021-12-25 DIAGNOSIS — M5416 Radiculopathy, lumbar region: Secondary | ICD-10-CM | POA: Diagnosis not present

## 2021-12-28 IMAGING — CR DG CHEST 2V
2 series · 2 of 2 positions shown · non-contrast
Comparison: May 01, 2014.

CLINICAL DATA: COPD exacerbation.

EXAM:
CHEST - 2 VIEW

[w chest pa]
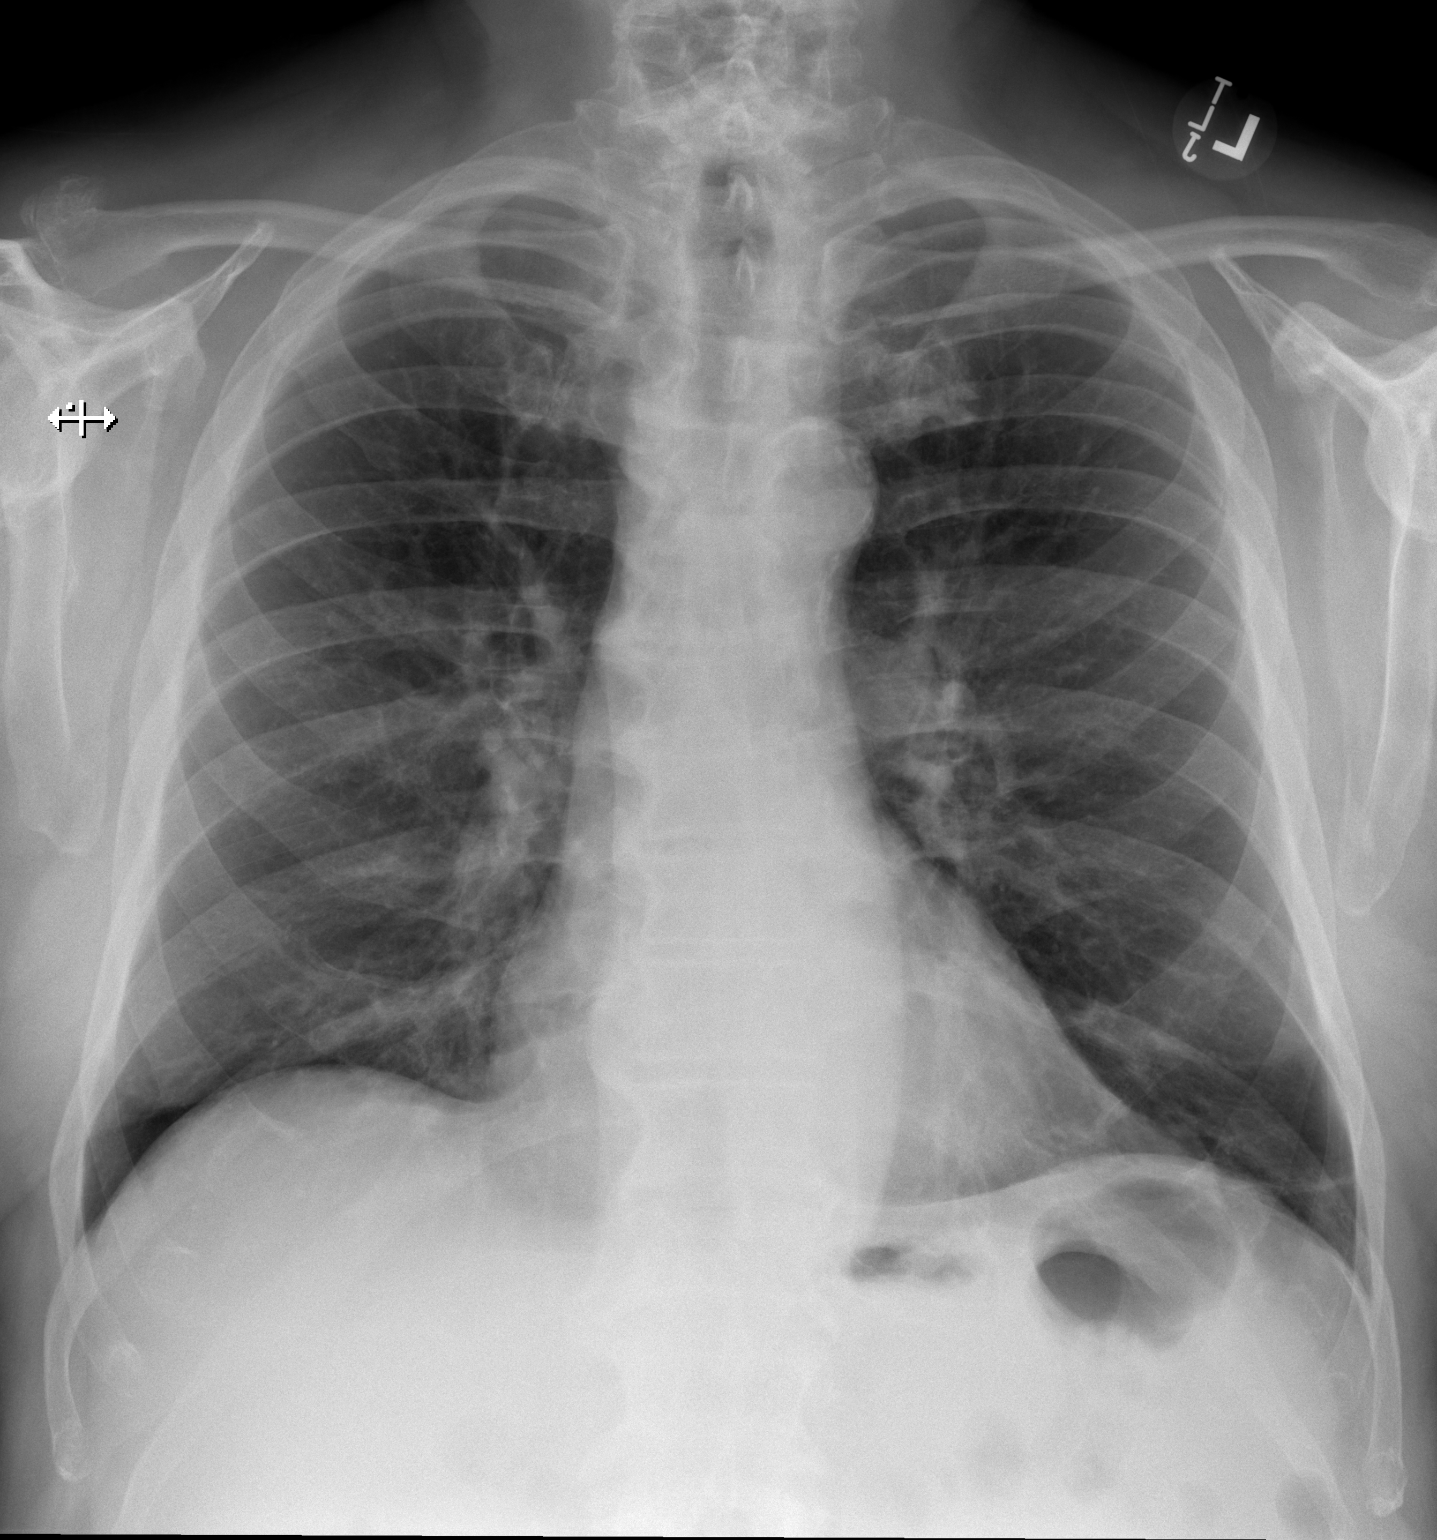

[w chest lat]
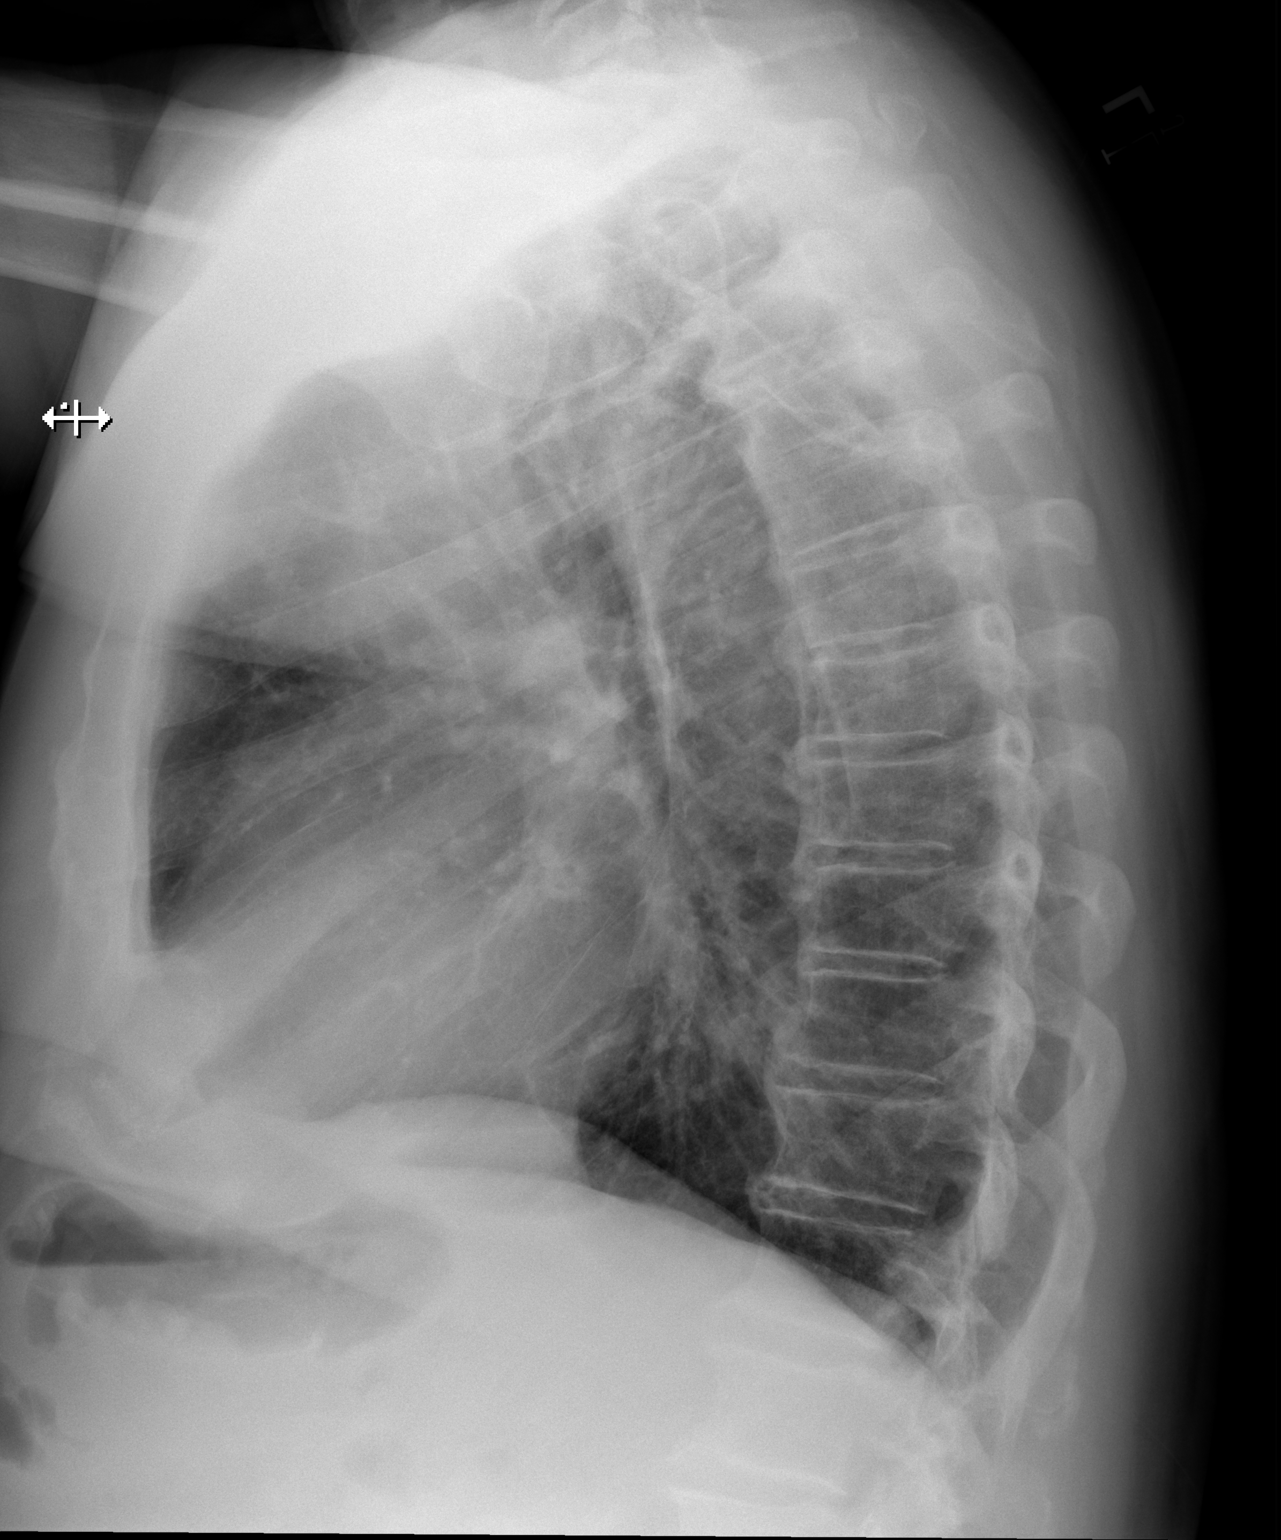

[2 of 2 positions shown; findings below may reference images not displayed]

FINDINGS: The heart size and mediastinal contours are within normal limits.
Both lungs are clear. No pneumothorax or pleural effusion is noted.
The visualized skeletal structures are unremarkable.
IMPRESSION: No active cardiopulmonary disease.

Aortic Atherosclerosis ([id number]-H34.4).

## 2021-12-30 DIAGNOSIS — I10 Essential (primary) hypertension: Secondary | ICD-10-CM | POA: Diagnosis not present

## 2021-12-30 DIAGNOSIS — G4733 Obstructive sleep apnea (adult) (pediatric): Secondary | ICD-10-CM | POA: Diagnosis not present

## 2021-12-30 DIAGNOSIS — E785 Hyperlipidemia, unspecified: Secondary | ICD-10-CM | POA: Diagnosis not present

## 2021-12-30 DIAGNOSIS — Z7901 Long term (current) use of anticoagulants: Secondary | ICD-10-CM | POA: Diagnosis not present

## 2021-12-30 DIAGNOSIS — Z23 Encounter for immunization: Secondary | ICD-10-CM | POA: Diagnosis not present

## 2022-01-01 DIAGNOSIS — R269 Unspecified abnormalities of gait and mobility: Secondary | ICD-10-CM | POA: Diagnosis not present

## 2022-01-01 DIAGNOSIS — M5416 Radiculopathy, lumbar region: Secondary | ICD-10-CM | POA: Diagnosis not present

## 2022-01-29 ENCOUNTER — Other Ambulatory Visit: Payer: Self-pay

## 2022-01-29 MED ORDER — RIVAROXABAN 20 MG PO TABS: 20.0000 mg | ORAL_TABLET | Freq: Every day | ORAL | 6 refills | Status: DC

## 2022-01-29 NOTE — Telephone Encounter (Signed)
Received fax refill request for Xarelto from [facility]. Pt last saw Ambrose Pancoast, NP on 09/20/21, last labs 07/02/21 Creat 0.91, age 81, weight 112.6kg, CrCl 104.83, based on CrCl pt is on appropriate dosage of Xarelto '20mg'$  QD for afib.  Will refill rx.

## 2022-02-01 DIAGNOSIS — R062 Wheezing: Secondary | ICD-10-CM | POA: Diagnosis not present

## 2022-02-01 DIAGNOSIS — B349 Viral infection, unspecified: Secondary | ICD-10-CM | POA: Diagnosis not present

## 2022-02-03 DIAGNOSIS — G4733 Obstructive sleep apnea (adult) (pediatric): Secondary | ICD-10-CM | POA: Diagnosis not present

## 2022-02-05 DIAGNOSIS — H903 Sensorineural hearing loss, bilateral: Secondary | ICD-10-CM | POA: Diagnosis not present

## 2022-02-18 DIAGNOSIS — M5416 Radiculopathy, lumbar region: Secondary | ICD-10-CM | POA: Diagnosis not present

## 2022-02-18 DIAGNOSIS — Z6836 Body mass index (BMI) 36.0-36.9, adult: Secondary | ICD-10-CM | POA: Diagnosis not present

## 2022-02-21 ENCOUNTER — Telehealth: Payer: Self-pay | Admitting: *Deleted

## 2022-02-21 NOTE — Patient Outreach (Signed)
  Care Coordination   02/21/2022 Name: Caleb Barton MRN: [id number] DOB: [birth year]   Care Coordination Outreach Attempts:  An unsuccessful telephone outreach was attempted today to offer the patient information about available care coordination services as a benefit of their health plan.   Follow Up Plan:  Additional outreach attempts will be made to offer the patient care coordination information and services.   Encounter Outcome:  No Answer  Care Coordination Interventions Activated:  No   Care Coordination Interventions:  No, not indicated    Raina Mina, RN Care Management Coordinator [facility] Office [phone]

## 2022-02-25 NOTE — Progress Notes (Signed)
Cardiology Office Note   Date:  03/03/2022   ID:  Caleb Barton, DOB 1940/06/01, MRN [id number]  PCP:  Caleb Smoker, MD  Cardiologist:   Caleb Burck [place], MD   Chief Complaint  Patient presents with   Atrial Fibrillation       History of Present Illness: Caleb Barton is a 81 y.o. male who is seen for follow up Afib.  He presented to the Afib clinic in May with new onset AFib. This was found when he was scheduled for colonoscopy. He was asymptomatic. Rate controlled on no therapy- pulse rate 60. He was anticoagulated and underwent DCCV on 10/14/16. He states he remained in NSR for only one day. Echo was unremarkable. Decision was made to continue anticoagulation and leave him in AFib since he was asymptomatic. Holter monitor was done in October 2018 to evaluate bradycardia. It showed no significant symptomatic bradycardia or pauses.   Review of his old records shows he had a  cardiac cath in 2007 by Dr. Tamala Barton. This was done for an abnormal Myoview study. Cardiac cath was normal. Interestingly the patient has no recollection of having this done. He has a history of LAFB/RBBB, HTN, OSA on CPAP, and remote thalamic CVA in 2006. Also history of HLD. Colonoscopy was done on 12/17/16 with findings of one polyp.   He was seen in February 2021 with symptoms of dizziness and dyspnea. Echo showed normal LV function, moderate biatrial enlargement, aortic dilation. Myoview was done showing a large inferior defect. 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   On follow up today he continues to feel well.  Denies any chest pain, palpitations, SOB. He had radiation therapy for prostate CA. No bleeding issues. Is compliant with CPAP. Did have knee surgery in March and did well.   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    Sleep apnea    cpap;sleep study done at home;request report from dr.gates   Stroke [facility])    TIA 15 yrs ago    Past Surgical History:  Procedure Laterality Date   CARDIOVERSION N/A 10/14/2016   Procedure: CARDIOVERSION;  Surgeon: Larey Dresser, MD;  Location: [facility] ENDOSCOPY;  Service: Cardiovascular;  Laterality: N/A;   cataracts both eyes     ioc lens   COLONOSCOPY     with polyps   COLONOSCOPY  08/20/2011   Procedure: COLONOSCOPY;  Surgeon: Caleb Cunas., MD;  Location: [facility] ENDOSCOPY;  Service: Endoscopy;  Laterality: N/A;   COLONOSCOPY WITH PROPOFOL N/A 12/17/2016   Procedure: COLONOSCOPY WITH PROPOFOL;  Surgeon: Caleb Spates, MD;  Location: [facility] ENDOSCOPY;  Service: Endoscopy;  Laterality: N/A;   EYE SURGERY     laser bilateral eyes   KNEE ARTHROSCOPY  x 2 right knee   LEFT HEART CATH AND CORONARY ANGIOGRAPHY N/A 07/29/2019   Procedure: LEFT HEART CATH AND CORONARY ANGIOGRAPHY;  Surgeon: Caleb Hampshire, MD;  Location: [place] CV LAB;  Service: Cardiovascular;  Laterality: N/A;   LUMBAR LAMINECTOMY/DECOMPRESSION MICRODISCECTOMY Right 09/09/2012   Procedure: LUMBAR LAMINECTOMY/DECOMPRESSION MICRODISCECTOMY 1 LEVEL;  Surgeon: Caleb Moore, MD;  Location:  [place] NEURO ORS;  Service: Neurosurgery;  Laterality: Right;  LUMBAR LAMINECTOMY/DECOMPRESSION MICRODISCECTOMY 1 LEVEL   TONSILLECTOMY  yrs ago   TOTAL KNEE ARTHROPLASTY  02/09/2012   Procedure: TOTAL KNEE ARTHROPLASTY;  Surgeon: Caleb Junes, MD;  Location: [place];  Service: Orthopedics;  Laterality: Right;   TOTAL KNEE ARTHROPLASTY Left 07/01/2021   Procedure: TOTAL KNEE ARTHROPLASTY;  Surgeon: Caleb Sheng, MD;  Location: [facility] ORS;  Service: Orthopedics;  Laterality: Left;     Current Outpatient Medications  Medication Sig Dispense Refill   amoxicillin (AMOXIL) 250 MG capsule Take 1,000 mg by mouth See admin  instructions. Take 1000 mg by mouth 1 hour prior to dental appointments     atorvastatin (LIPITOR) 40 MG tablet Take 40 mg by mouth daily.     Azelastine HCl 137 MCG/SPRAY SOLN Place 1 puff into both nostrils 2 (two) times daily as needed (allergies).     dorzolamide-timolol (COSOPT) 22.3-6.8 MG/ML ophthalmic solution Place 1 drop into both eyes 2 (two) times daily.      hydrochlorothiazide (HYDRODIURIL) 25 MG tablet Take 25 mg by mouth daily.     latanoprost (XALATAN) 0.005 % ophthalmic solution Place 1 drop into both eyes at bedtime.      losartan (COZAAR) 50 MG tablet TAKE ONE AND ONE-HALF TABLET BY MOUTH DAILY 145 tablet 3   methocarbamol (ROBAXIN) 500 MG tablet Take 500 mg by mouth every 6 (six) hours as needed.     Multiple Vitamin (MULTIVITAMIN WITH MINERALS) TABS tablet Take 1 tablet by mouth daily.     Potassium 99 MG TABS Take 99 mg by mouth daily.     RESTASIS 0.05 % ophthalmic emulsion Place 1 drop into both eyes 2 (two) times daily.      rivaroxaban (XARELTO) 20 MG TABS tablet Take 1 tablet (20 mg total) by mouth daily with supper. 30 tablet 6   tamsulosin (FLOMAX) 0.4 MG CAPS capsule Take 0.4 mg by mouth daily.     vitamin C (ASCORBIC ACID) 500 MG tablet Take 500 mg by mouth daily.     No current facility-administered medications for this visit.    Allergies:   Lisinopril and Sulfa antibiotics    Social History:  The patient  reports that he has quit smoking. His smoking use included cigarettes. He has a 20.00 pack-year smoking history. He has never used smokeless tobacco. He reports current alcohol use. He reports that he does not use drugs.   Family History:  The patient's family history includes Bladder Cancer in his brother; Breast cancer in his mother and another family member; Stroke in his mother.    ROS:  Please see the history of present illness.   Otherwise, review of systems are positive for none.   All other systems are reviewed and negative.    PHYSICAL  EXAM: VS:  BP (!) 143/86   Pulse 72   Ht '5\' 9"'$  (1.753 m)   Wt 242 lb 12.8 oz (110.1 kg)   SpO2 97%   BMI 35.86 kg/m  , BMI Body mass index is 35.86 kg/m. GENERAL:  Well appearing overweight WM HEENT:  PERRL, EOMI, sclera are clear. Oropharynx is clear. NECK:  No jugular venous distention, carotid upstroke brisk and symmetric, no bruits, no thyromegaly or adenopathy LUNGS:  Clear to auscultation bilaterally CHEST:  Unremarkable HEART:  IRRR,  PMI not displaced or sustained,S1 and S2 within normal limits, no S3, no S4: no clicks, no rubs, no murmurs ABD:  Soft, nontender. BS +,  no masses or bruits. No hepatomegaly, no splenomegaly EXT:  2 + pulses throughout, no edema, no cyanosis no clubbing SKIN:  Warm and dry.  No rashes NEURO:  Alert and oriented x 3. Cranial nerves II through XII intact. PSYCH:  Cognitively intact    Recent Labs: 06/24/2021: ALT 29 07/02/2021: BUN 13; Creatinine, Ser 0.91; Hemoglobin 12.7; Platelets 169; Potassium 3.8; Sodium 137    Lipid Panel No results found for: "CHOL", "TRIG", "HDL", "CHOLHDL", "VLDL", "LDLCALC", "LDLDIRECT"    Wt Readings from Last 3 Encounters:  03/03/22 242 lb 12.8 oz (110.1 kg)  07/01/21 248 lb 3.8 oz (112.6 kg)  06/24/21 241 lb (109.3 kg)    Dated 05/09/16: cholesterol 148, triglycerides 108, HDL 44, LDL 82.  Dated 05/21/17: cholesterol 121, triglycerides 81, HDL 34, LDL 71. Chemistry panel normal. Dated 06/10/18: cholesterol 120, triglycerides 138, HDL 37, LDL 56. Hgb and BMET normal Dated 06/15/19: cholesterol 131, triglycerides 93, HDL 43, LDL 71.  Dated 06/18/20: cholesterol 149, triglycerides 132, HDL 45, LDL 80. CMET and CBC normal.  Ecg today shows AFib with rate 72.  LAFB, RBBB. I have personally reviewed and interpreted this study.   Other studies Reviewed: Echo: 09/23/16: Study Conclusions   - Left ventricle: The cavity size was normal. Systolic function was   normal. The estimated ejection fraction was in the range of  55%   to 60%. Wall motion was normal; there were no regional wall   motion abnormalities. - Mitral valve: Calcified annulus. - Left atrium: The atrium was mildly dilated. - Atrial septum: No defect or patent foramen ovale was identified  Holter 02/12/17: Study Highlights   Atrial fibrillation/flutter with controlled ventricular rate. Mean HR 81 bpm Slowest HR of 35 during sleeping hours. Occasional PVCs and rare PVC couplets.     Echo 06/30/19: IMPRESSIONS     1. Left ventricular ejection fraction, by estimation, is 55 to 60%. The  left ventricle has normal function. The left ventricle has no regional  wall motion abnormalities. There is indeterminate left ventricular  hypertrophy. Left ventricular diastolic  parameters are indeterminate.   2. Right ventricular systolic function is normal. The right ventricular  size is normal. Tricuspid regurgitation signal is inadequate for assessing  PA pressure.   3. Left atrial size was moderately dilated.   4. Right atrial size was moderately dilated.   5. The mitral valve is normal in structure. No evidence of mitral valve  regurgitation. No evidence of mitral stenosis.   6. The aortic valve was not well visualized. Aortic valve regurgitation  is not visualized. No aortic stenosis is present.   7. Aortic dilatation noted. 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   8. The inferior vena cava is normal in size with greater than 50%  respiratory variability, suggesting right atrial pressure of 3 mmHg.   Myoview 07/19/19: Study Highlights    The left ventricular ejection fraction is mildly decreased (45-54%). Nuclear stress EF: 53%. There was no ST segment deviation noted during stress. Defect 1: There is a large defect of moderate severity present in the basal anteroseptal, basal inferoseptal, basal inferior, basal inferolateral, mid inferoseptal, mid inferior and apical  inferior location. This is an intermediate risk study. No prior study for comparison.   EF low normal without specific wall motion abnormalities seen, but there is a large fixed defect in the inferior/inferoseptal walls from base to apex, extending to anteroseptal/inferolateral areas  at the base. This does not change significantly with stress imaging. There is no clear diaphragmatic attenuation to explain findings. There are notes re: abnormal myoview with normal cath in 2007, but I cannot see this study to compare. Intermediate risk given size of area, but no ischemia seen.   Cardiac cath 07/29/19:  LEFT HEART CATH AND CORONARY ANGIOGRAPHY  Conclusion    Mid RCA lesion is 30% stenosed. Ost RCA lesion is 20% stenosed. Ost LAD lesion is 20% stenosed. Ramus lesion is 60% stenosed.   1.  Mild nonobstructive coronary artery disease.  Borderline stenosis in ostial ramus. 2.  Left ventricular angiography was not performed.  EF was normal by echo. 3.  Mildly elevated left ventricular end-diastolic pressure at 15 mmHg.   Recommendations: Suspect false positive nuclear stress test. Continue medical therapy.  ASSESSMENT AND PLAN:  1.  Atrial fibrillation persistent/permanent.  S/p DCCV but did not hold. Asymptomatic. Rate is controlled. Will continue with rate control and anticoagulation.   2. OSA on CPAP. compliant 3. HTN controlled. 4. Remote CVA.  5. Hypercholesterolemia. On statin 6. Nonobstructive CAD   Disposition:   FU with me in one year.   Signed, Myria Steenbergen Martinique, MD  03/03/2022 11:02 AM    [facility] [address], [place], [place], [address] Phone [phone], Fax [phone]

## 2022-03-03 ENCOUNTER — Ambulatory Visit: Payer: Medicare HMO | Attending: Cardiology | Admitting: Cardiology

## 2022-03-03 ENCOUNTER — Encounter: Payer: Self-pay | Admitting: Cardiology

## 2022-03-03 VITALS — BP 143/86 | HR 72 | Ht 69.0 in | Wt 242.8 lb

## 2022-03-03 DIAGNOSIS — I4821 Permanent atrial fibrillation: Secondary | ICD-10-CM | POA: Diagnosis not present

## 2022-03-03 DIAGNOSIS — I452 Bifascicular block: Secondary | ICD-10-CM

## 2022-03-03 DIAGNOSIS — I10 Essential (primary) hypertension: Secondary | ICD-10-CM | POA: Diagnosis not present

## 2022-03-03 NOTE — Addendum Note (Signed)
Addended by: Kathyrn Lass on: 03/03/2022 11:05 AM   Modules accepted: Orders

## 2022-03-31 DIAGNOSIS — Z7901 Long term (current) use of anticoagulants: Secondary | ICD-10-CM | POA: Diagnosis not present

## 2022-03-31 DIAGNOSIS — Q438 Other specified congenital malformations of intestine: Secondary | ICD-10-CM | POA: Diagnosis not present

## 2022-03-31 DIAGNOSIS — Z86010 Personal history of colonic polyps: Secondary | ICD-10-CM | POA: Diagnosis not present

## 2022-04-02 DIAGNOSIS — Z8546 Personal history of malignant neoplasm of prostate: Secondary | ICD-10-CM | POA: Diagnosis not present

## 2022-04-09 ENCOUNTER — Other Ambulatory Visit: Payer: Self-pay | Admitting: Gastroenterology

## 2022-04-09 DIAGNOSIS — Q438 Other specified congenital malformations of intestine: Secondary | ICD-10-CM

## 2022-04-09 DIAGNOSIS — Z86010 Personal history of colonic polyps: Secondary | ICD-10-CM

## 2022-04-09 DIAGNOSIS — Z8546 Personal history of malignant neoplasm of prostate: Secondary | ICD-10-CM | POA: Diagnosis not present

## 2022-04-09 DIAGNOSIS — Z7901 Long term (current) use of anticoagulants: Secondary | ICD-10-CM

## 2022-04-09 DIAGNOSIS — N401 Enlarged prostate with lower urinary tract symptoms: Secondary | ICD-10-CM | POA: Diagnosis not present

## 2022-04-09 DIAGNOSIS — R351 Nocturia: Secondary | ICD-10-CM | POA: Diagnosis not present

## 2022-04-18 DIAGNOSIS — R102 Pelvic and perineal pain: Secondary | ICD-10-CM | POA: Diagnosis not present

## 2022-04-29 DIAGNOSIS — L570 Actinic keratosis: Secondary | ICD-10-CM | POA: Diagnosis not present

## 2022-04-29 DIAGNOSIS — L821 Other seborrheic keratosis: Secondary | ICD-10-CM | POA: Diagnosis not present

## 2022-04-29 DIAGNOSIS — Z85828 Personal history of other malignant neoplasm of skin: Secondary | ICD-10-CM | POA: Diagnosis not present

## 2022-04-29 DIAGNOSIS — L812 Freckles: Secondary | ICD-10-CM | POA: Diagnosis not present

## 2022-04-29 DIAGNOSIS — D1801 Hemangioma of skin and subcutaneous tissue: Secondary | ICD-10-CM | POA: Diagnosis not present

## 2022-05-04 DIAGNOSIS — G4733 Obstructive sleep apnea (adult) (pediatric): Secondary | ICD-10-CM | POA: Diagnosis not present

## 2022-05-05 DIAGNOSIS — D6869 Other thrombophilia: Secondary | ICD-10-CM | POA: Diagnosis not present

## 2022-05-05 DIAGNOSIS — E785 Hyperlipidemia, unspecified: Secondary | ICD-10-CM | POA: Diagnosis not present

## 2022-05-05 DIAGNOSIS — M1990 Unspecified osteoarthritis, unspecified site: Secondary | ICD-10-CM | POA: Diagnosis not present

## 2022-05-05 DIAGNOSIS — Z008 Encounter for other general examination: Secondary | ICD-10-CM | POA: Diagnosis not present

## 2022-05-05 DIAGNOSIS — Z87891 Personal history of nicotine dependence: Secondary | ICD-10-CM | POA: Diagnosis not present

## 2022-05-05 DIAGNOSIS — Z8673 Personal history of transient ischemic attack (TIA), and cerebral infarction without residual deficits: Secondary | ICD-10-CM | POA: Diagnosis not present

## 2022-05-05 DIAGNOSIS — N400 Enlarged prostate without lower urinary tract symptoms: Secondary | ICD-10-CM | POA: Diagnosis not present

## 2022-05-05 DIAGNOSIS — I2510 Atherosclerotic heart disease of native coronary artery without angina pectoris: Secondary | ICD-10-CM | POA: Diagnosis not present

## 2022-05-05 DIAGNOSIS — I719 Aortic aneurysm of unspecified site, without rupture: Secondary | ICD-10-CM | POA: Diagnosis not present

## 2022-05-05 DIAGNOSIS — I4891 Unspecified atrial fibrillation: Secondary | ICD-10-CM | POA: Diagnosis not present

## 2022-05-05 DIAGNOSIS — G4733 Obstructive sleep apnea (adult) (pediatric): Secondary | ICD-10-CM | POA: Diagnosis not present

## 2022-05-05 DIAGNOSIS — I10 Essential (primary) hypertension: Secondary | ICD-10-CM | POA: Diagnosis not present

## 2022-05-05 DIAGNOSIS — M791 Myalgia, unspecified site: Secondary | ICD-10-CM | POA: Diagnosis not present

## 2022-05-16 DIAGNOSIS — I4811 Longstanding persistent atrial fibrillation: Secondary | ICD-10-CM | POA: Diagnosis not present

## 2022-05-16 DIAGNOSIS — R053 Chronic cough: Secondary | ICD-10-CM | POA: Diagnosis not present

## 2022-05-16 DIAGNOSIS — J441 Chronic obstructive pulmonary disease with (acute) exacerbation: Secondary | ICD-10-CM | POA: Diagnosis not present

## 2022-05-16 DIAGNOSIS — I700 Atherosclerosis of aorta: Secondary | ICD-10-CM | POA: Diagnosis not present

## 2022-05-16 DIAGNOSIS — D6869 Other thrombophilia: Secondary | ICD-10-CM | POA: Diagnosis not present

## 2022-05-22 ENCOUNTER — Ambulatory Visit
Admission: RE | Admit: 2022-05-22 | Discharge: 2022-05-22 | Disposition: A | Discharge status: Home / Self Care | Payer: Medicare HMO | Source: Ambulatory Visit | Attending: Gastroenterology | Admitting: Gastroenterology

## 2022-05-22 DIAGNOSIS — Z7901 Long term (current) use of anticoagulants: Secondary | ICD-10-CM

## 2022-05-22 DIAGNOSIS — Q438 Other specified congenital malformations of intestine: Secondary | ICD-10-CM

## 2022-05-22 DIAGNOSIS — Z0389 Encounter for observation for other suspected diseases and conditions ruled out: Secondary | ICD-10-CM | POA: Diagnosis not present

## 2022-05-22 DIAGNOSIS — Z86010 Personal history of colonic polyps: Secondary | ICD-10-CM

## 2022-05-22 DIAGNOSIS — I700 Atherosclerosis of aorta: Secondary | ICD-10-CM | POA: Diagnosis not present

## 2022-05-27 ENCOUNTER — Telehealth: Payer: Self-pay

## 2022-05-27 NOTE — Telephone Encounter (Signed)
   Pre-operative Risk Assessment    Patient Name: Caleb Barton  DOB: 1940-07-20 MRN: [id number]      Request for Surgical Clearance    Procedure:   Colonoscopy  Date of Surgery:  Clearance TBD                                 Surgeon:  no surgeon picked out yet, saw Bayley McMichael PA-C Surgeon's Group or Practice Name:  [facility] Phone number:  [phone] Fax number:  [phone]   Type of Clearance Requested:   - Medical  - Pharmacy:  Hold Rivaroxaban (Xarelto) 3 days   Type of Anesthesia:   Propofol   Additional requests/questions:   N/A  Toma Deiters   05/27/2022, 7:42 AM

## 2022-05-28 NOTE — Telephone Encounter (Signed)
Patient with diagnosis of atrial fibrillation on Eliquis for anticoagulation.    Procedure:   Colonoscopy Date of Surgery:  Clearance TBD   CHA2DS2-VASc Score = 5   This indicates a 7.2% annual risk of stroke. The patient's score is based upon: CHF History: 0 HTN History: 1 Diabetes History: 0 Stroke History: 2 Vascular Disease History: 0 Age Score: 2 Gender Score: 0   Thalamic stroke 2006 per chart  CrCl 80 Platelet count 169  Per office protocol, patient can hold Xarelto for 1 days prior to procedure.   Patient will not need bridging with Lovenox (enoxaparin) around procedure.  **This guidance is not considered finalized until pre-operative APP has relayed final recommendations.**

## 2022-05-28 NOTE — Telephone Encounter (Signed)
Left message for pt to call back for tele pre op appt.

## 2022-05-28 NOTE — Telephone Encounter (Signed)
   Name: Caleb Barton  DOB: April 10, 1941  MRN: [id number]  Primary Cardiologist: Peter Martinique, MD   Preoperative team, please contact this patient and set up a phone call appointment for further preoperative risk assessment. Please obtain consent and complete medication review. Thank you for your help.  I confirm that guidance regarding antiplatelet and oral anticoagulation therapy has been completed and, if necessary, noted below.  Per office protocol, patient can hold Xarelto for 1 days prior to procedure.   Patient will not need bridging with Lovenox (enoxaparin) around procedure.    Lenna Sciara, NP 05/28/2022, 3:45 PM [facility]

## 2022-05-29 ENCOUNTER — Telehealth: Payer: Self-pay | Admitting: *Deleted

## 2022-05-29 DIAGNOSIS — Z6836 Body mass index (BMI) 36.0-36.9, adult: Secondary | ICD-10-CM | POA: Diagnosis not present

## 2022-05-29 DIAGNOSIS — M47816 Spondylosis without myelopathy or radiculopathy, lumbar region: Secondary | ICD-10-CM | POA: Diagnosis not present

## 2022-05-29 NOTE — Telephone Encounter (Signed)
Pt has been set up for tele pre op appt 06/04/22 @ 2:20. Med rec and consent are done.     Patient Consent for Virtual Visit        Caleb Barton has provided verbal consent on 05/29/2022 for a virtual visit (video or telephone).   CONSENT FOR VIRTUAL VISIT FOR:  Caleb Barton  By participating in this virtual visit I agree to the following:  I hereby voluntarily request, consent and authorize [facility] and its employed or contracted physicians, physician assistants, nurse practitioners or other licensed health care professionals (the Practitioner), to provide me with telemedicine health care services (the "Services") as deemed necessary by the treating Practitioner. I acknowledge and consent to receive the Services by the Practitioner via telemedicine. I understand that the telemedicine visit will involve communicating with the Practitioner through live audiovisual communication technology and the disclosure of certain medical information by electronic transmission. I acknowledge that I have been given the opportunity to request an in-person assessment or other available alternative prior to the telemedicine visit and am voluntarily participating in the telemedicine visit.  I understand that I have the right to withhold or withdraw my consent to the use of telemedicine in the course of my care at any time, without affecting my right to future care or treatment, and that the Practitioner or I may terminate the telemedicine visit at any time. I understand that I have the right to inspect all information obtained and/or recorded in the course of the telemedicine visit and may receive copies of available information for a reasonable fee.  I understand that some of the potential risks of receiving the Services via telemedicine include:  Delay or interruption in medical evaluation due to technological equipment failure or disruption; Information transmitted may not be sufficient (e.g. poor  resolution of images) to allow for appropriate medical decision making by the Practitioner; and/or  In rare instances, security protocols could fail, causing a breach of personal health information.  Furthermore, I acknowledge that it is my responsibility to provide information about my medical history, conditions and care that is complete and accurate to the best of my ability. I acknowledge that Practitioner's advice, recommendations, and/or decision may be based on factors not within their control, such as incomplete or inaccurate data provided by me or distortions of diagnostic images or specimens that may result from electronic transmissions. I understand that the practice of medicine is not an exact science and that Practitioner makes no warranties or guarantees regarding treatment outcomes. I acknowledge that a copy of this consent can be made available to me via my patient portal ([place] [facility]), or I can request a printed copy by calling the office of [facility].    I understand that my insurance will be billed for this visit.   I have read or had this consent read to me. I understand the contents of this consent, which adequately explains the benefits and risks of the Services being provided via telemedicine.  I have been provided ample opportunity to ask questions regarding this consent and the Services and have had my questions answered to my satisfaction. I give my informed consent for the services to be provided through the use of telemedicine in my medical care

## 2022-05-29 NOTE — Telephone Encounter (Signed)
Pt has been set up for tele pre op appt 06/04/22 @ 2:20. Med rec and consent are done.

## 2022-05-29 NOTE — Telephone Encounter (Signed)
Patient was returning call. Please advise  

## 2022-06-03 NOTE — Progress Notes (Unsigned)
Virtual Visit via Telephone Note   Because of Caleb Barton's co-morbid illnesses, he is at least at moderate risk for complications without adequate follow up.  This format is felt to be most appropriate for this patient at this time.  The patient did not have access to video technology/had technical difficulties with video requiring transitioning to audio format only (telephone).  All issues noted in this document were discussed and addressed.  No physical exam could be performed with this format.  Please refer to the patient's chart for his consent to telehealth for [facility].  Evaluation Performed:  Preoperative cardiovascular risk assessment _____________   Date:  06/04/2022   Patient ID:  Caleb Barton, DOB [birth year], MRN [id number] Patient Location:  Home Provider location:   Office  Primary Care Provider:  Glenis Smoker, MD Primary Cardiologist:  Caleb Martinique, MD  Chief Complaint / Patient Profile   82 y.o. 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  History of Present Illness    Caleb Barton is a 82 y.o. male who presents via audio/video conferencing for a telehealth visit today.  Pt was last seen in cardiology clinic on 03/03/2022 by Dr. Martinique.  At that time Caleb Barton was doing well and denied any chest pain, palpitations, shortness of breath.  He had radiation therapy for prostate cancer with no bleeding issues and has reported compliance with CPAP.  He recovered well from the surgery in 06/2021.  The patient is now pending procedure as outlined above. Since his last visit, he reports that he has not had any cardiac related complaints.  He is staying active and has no difficulties completing 4 METS of activity.  He denies chest pain, shortness of breath, lower extremity edema, fatigue, palpitations,  melena, hematuria, hemoptysis, diaphoresis, weakness, presyncope, syncope, orthopnea, and PND.   Per office protocol, patient can hold Xarelto for 1 days prior to procedure.   Patient will not need bridging with Lovenox (enoxaparin) around procedure.   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    Sleep apnea    cpap;sleep study done at home;request report from dr.gates   Stroke [facility])    TIA 15 yrs ago   Past Surgical History:  Procedure Laterality Date   CARDIOVERSION N/A 10/14/2016   Procedure: CARDIOVERSION;  Surgeon: Larey Dresser, MD;  Location: [facility] ENDOSCOPY;  Service: Cardiovascular;  Laterality: N/A;   cataracts both eyes     ioc lens   COLONOSCOPY     with polyps   COLONOSCOPY  08/20/2011   Procedure: COLONOSCOPY;  Surgeon: Winfield Cunas., MD;  Location: [facility] ENDOSCOPY;  Service: Endoscopy;  Laterality: N/A;   COLONOSCOPY WITH PROPOFOL N/A 12/17/2016   Procedure: COLONOSCOPY WITH PROPOFOL;  Surgeon: Laurence Spates, MD;  Location: [facility] ENDOSCOPY;  Service: Endoscopy;  Laterality: N/A;   EYE SURGERY     laser bilateral eyes   KNEE ARTHROSCOPY  x 2 right knee   LEFT HEART CATH AND  CORONARY ANGIOGRAPHY N/A 07/29/2019   Procedure: LEFT HEART CATH AND CORONARY ANGIOGRAPHY;  Surgeon: Wellington Hampshire, MD;  Location: [place] CV LAB;  Service: Cardiovascular;  Laterality: N/A;   LUMBAR LAMINECTOMY/DECOMPRESSION MICRODISCECTOMY Right 09/09/2012   Procedure: LUMBAR LAMINECTOMY/DECOMPRESSION MICRODISCECTOMY 1 LEVEL;  Surgeon: Eustace Moore, MD;  Location: [place] NEURO ORS;  Service: Neurosurgery;  Laterality: Right;  LUMBAR LAMINECTOMY/DECOMPRESSION MICRODISCECTOMY 1 LEVEL   TONSILLECTOMY  yrs ago   TOTAL KNEE  ARTHROPLASTY  02/09/2012   Procedure: TOTAL KNEE ARTHROPLASTY;  Surgeon: Lorn Junes, MD;  Location: [place];  Service: Orthopedics;  Laterality: Right;   TOTAL KNEE ARTHROPLASTY Left 07/01/2021   Procedure: TOTAL KNEE ARTHROPLASTY;  Surgeon: Willaim Sheng, MD;  Location: [facility] ORS;  Service: Orthopedics;  Laterality: Left;    Allergies  Allergies  Allergen Reactions   Lisinopril Cough   Sulfa Antibiotics Rash    Home Medications    Prior to Admission medications   Medication Sig Start Date End Date Taking? Authorizing Provider  amoxicillin (AMOXIL) 250 MG capsule Take 1,000 mg by mouth See admin instructions. Take 1000 mg by mouth 1 hour prior to dental appointments Patient not taking: Reported on 05/29/2022    [provider]  atorvastatin (LIPITOR) 40 MG tablet Take 40 mg by mouth daily.    [provider]  Azelastine HCl 137 MCG/SPRAY SOLN Place 1 puff into both nostrils 2 (two) times daily as needed (allergies). Patient not taking: Reported on 05/29/2022 06/26/20   [provider]  dorzolamide-timolol (COSOPT) 22.3-6.8 MG/ML ophthalmic solution Place 1 drop into both eyes 2 (two) times daily.  09/18/19   [provider]  hydrochlorothiazide (HYDRODIURIL) 25 MG tablet Take 25 mg by mouth daily.    [provider]  latanoprost (XALATAN) 0.005 % ophthalmic solution Place 1 drop into both eyes at bedtime.     [provider]  losartan (COZAAR) 50 MG tablet TAKE ONE AND ONE-HALF TABLET BY MOUTH DAILY 12/02/21   Barton, Caleb M, MD  methocarbamol (ROBAXIN) 500 MG tablet Take 500 mg by mouth every 6 (six) hours as needed. 09/17/21   [provider]  Multiple Vitamin (MULTIVITAMIN WITH MINERALS) TABS tablet Take 1 tablet by mouth daily.    [provider]  Potassium 99 MG TABS Take 99 mg by mouth daily.    [provider]  RESTASIS 0.05 % ophthalmic emulsion Place 1 drop into both eyes 2 (two) times daily.   07/07/19   [provider]  rivaroxaban (XARELTO) 20 MG TABS tablet Take 1 tablet (20 mg total) by mouth daily with supper. 01/29/22   Barton, Caleb M, MD  tamsulosin (FLOMAX) 0.4 MG CAPS capsule Take 0.4 mg by mouth daily. 06/15/19   [provider]  vitamin C (ASCORBIC ACID) 500 MG tablet Take 500 mg by mouth daily.    [provider]    Physical Exam    Vital Signs:  Caleb Barton does not have vital signs available for review today.  Given telephonic nature of communication, physical exam is limited. AAOx3. NAD. Normal affect.  Speech and respirations are unlabored.  Accessory Clinical Findings    None  Assessment & Plan    1.  Preoperative Cardiovascular Risk Assessment:  Caleb Barton perioperative risk of a major cardiac event is 0.9% according to the Revised Cardiac Risk Index (RCRI).  Therefore, he is at low risk for perioperative complications.   His functional capacity is good at 4.31 METs according to  the [employer] Activity Status Index (DASI). Recommendations: According to ACC/AHA guidelines, no further cardiovascular testing needed.  The patient may proceed to surgery at acceptable risk.   Antiplatelet and/or Anticoagulation Recommendations:  Xarelto (Rivaroxaban) can be held for 1 days prior to surgery.  Please resume post op when felt to be safe.     The patient was advised that if he develops new symptoms prior to surgery to contact our office to arrange for a follow-up visit, and he verbalized understanding.  Time:   Today, I have spent 6 minutes with the patient with telehealth technology discussing medical history, symptoms, and management plan.     Mable Fill, Marissa Nestle, NP  06/04/2022, 2:23 PM

## 2022-06-04 ENCOUNTER — Ambulatory Visit: Payer: Medicare HMO | Attending: Internal Medicine

## 2022-06-04 DIAGNOSIS — Z01810 Encounter for preprocedural cardiovascular examination: Secondary | ICD-10-CM | POA: Diagnosis not present

## 2022-06-09 DIAGNOSIS — H0288A Meibomian gland dysfunction right eye, upper and lower eyelids: Secondary | ICD-10-CM | POA: Diagnosis not present

## 2022-06-09 DIAGNOSIS — H401131 Primary open-angle glaucoma, bilateral, mild stage: Secondary | ICD-10-CM | POA: Diagnosis not present

## 2022-06-09 DIAGNOSIS — H0288B Meibomian gland dysfunction left eye, upper and lower eyelids: Secondary | ICD-10-CM | POA: Diagnosis not present

## 2022-06-18 DIAGNOSIS — M47816 Spondylosis without myelopathy or radiculopathy, lumbar region: Secondary | ICD-10-CM | POA: Diagnosis not present

## 2022-06-23 DIAGNOSIS — H903 Sensorineural hearing loss, bilateral: Secondary | ICD-10-CM | POA: Diagnosis not present

## 2022-06-24 DIAGNOSIS — H903 Sensorineural hearing loss, bilateral: Secondary | ICD-10-CM | POA: Diagnosis not present

## 2022-07-01 DIAGNOSIS — M47816 Spondylosis without myelopathy or radiculopathy, lumbar region: Secondary | ICD-10-CM | POA: Diagnosis not present

## 2022-07-02 ENCOUNTER — Other Ambulatory Visit: Payer: Self-pay | Admitting: Gastroenterology

## 2022-07-03 DIAGNOSIS — I700 Atherosclerosis of aorta: Secondary | ICD-10-CM | POA: Diagnosis not present

## 2022-07-03 DIAGNOSIS — Z0000 Encounter for general adult medical examination without abnormal findings: Secondary | ICD-10-CM | POA: Diagnosis not present

## 2022-07-03 DIAGNOSIS — Z9989 Dependence on other enabling machines and devices: Secondary | ICD-10-CM | POA: Diagnosis not present

## 2022-07-03 DIAGNOSIS — N401 Enlarged prostate with lower urinary tract symptoms: Secondary | ICD-10-CM | POA: Diagnosis not present

## 2022-07-03 DIAGNOSIS — G4733 Obstructive sleep apnea (adult) (pediatric): Secondary | ICD-10-CM | POA: Diagnosis not present

## 2022-07-03 DIAGNOSIS — J441 Chronic obstructive pulmonary disease with (acute) exacerbation: Secondary | ICD-10-CM | POA: Diagnosis not present

## 2022-07-03 DIAGNOSIS — I10 Essential (primary) hypertension: Secondary | ICD-10-CM | POA: Diagnosis not present

## 2022-07-03 DIAGNOSIS — D6869 Other thrombophilia: Secondary | ICD-10-CM | POA: Diagnosis not present

## 2022-07-03 DIAGNOSIS — E785 Hyperlipidemia, unspecified: Secondary | ICD-10-CM | POA: Diagnosis not present

## 2022-07-03 DIAGNOSIS — H903 Sensorineural hearing loss, bilateral: Secondary | ICD-10-CM | POA: Diagnosis not present

## 2022-07-03 DIAGNOSIS — I4811 Longstanding persistent atrial fibrillation: Secondary | ICD-10-CM | POA: Diagnosis not present

## 2022-07-04 ENCOUNTER — Encounter (HOSPITAL_COMMUNITY): Payer: Self-pay | Admitting: Gastroenterology

## 2022-07-04 NOTE — Progress Notes (Signed)
Attempted to obtain medical history via telephone, unable to reach at this time. HIPAA compliant voicemail message left requesting return call to pre surgical testing department. 

## 2022-07-08 ENCOUNTER — Ambulatory Visit (HOSPITAL_BASED_OUTPATIENT_CLINIC_OR_DEPARTMENT_OTHER): Payer: Medicare HMO | Admitting: Certified Registered"

## 2022-07-08 ENCOUNTER — Encounter (HOSPITAL_COMMUNITY)
Admission: RE | Disposition: A | Discharge status: Home / Self Care | Payer: Self-pay | Source: Ambulatory Visit | Attending: Gastroenterology

## 2022-07-08 ENCOUNTER — Other Ambulatory Visit: Payer: Self-pay

## 2022-07-08 ENCOUNTER — Encounter (HOSPITAL_COMMUNITY): Payer: Self-pay | Admitting: Gastroenterology

## 2022-07-08 ENCOUNTER — Ambulatory Visit (HOSPITAL_COMMUNITY)
Admission: RE | Admit: 2022-07-08 | Discharge: 2022-07-08 | Disposition: A | Discharge status: Home / Self Care | Payer: Medicare HMO | Source: Ambulatory Visit | Attending: Gastroenterology | Admitting: Gastroenterology

## 2022-07-08 ENCOUNTER — Ambulatory Visit (HOSPITAL_COMMUNITY): Payer: Medicare HMO | Admitting: Certified Registered"

## 2022-07-08 DIAGNOSIS — R933 Abnormal findings on diagnostic imaging of other parts of digestive tract: Secondary | ICD-10-CM | POA: Insufficient documentation

## 2022-07-08 DIAGNOSIS — Z09 Encounter for follow-up examination after completed treatment for conditions other than malignant neoplasm: Secondary | ICD-10-CM | POA: Insufficient documentation

## 2022-07-08 DIAGNOSIS — G4730 Sleep apnea, unspecified: Secondary | ICD-10-CM | POA: Insufficient documentation

## 2022-07-08 DIAGNOSIS — I10 Essential (primary) hypertension: Secondary | ICD-10-CM | POA: Diagnosis not present

## 2022-07-08 DIAGNOSIS — D123 Benign neoplasm of transverse colon: Secondary | ICD-10-CM

## 2022-07-08 DIAGNOSIS — Z1211 Encounter for screening for malignant neoplasm of colon: Secondary | ICD-10-CM | POA: Diagnosis not present

## 2022-07-08 DIAGNOSIS — Z8673 Personal history of transient ischemic attack (TIA), and cerebral infarction without residual deficits: Secondary | ICD-10-CM | POA: Diagnosis not present

## 2022-07-08 DIAGNOSIS — Z86010 Personal history of colonic polyps: Secondary | ICD-10-CM | POA: Diagnosis not present

## 2022-07-08 DIAGNOSIS — D124 Benign neoplasm of descending colon: Secondary | ICD-10-CM | POA: Diagnosis not present

## 2022-07-08 DIAGNOSIS — K648 Other hemorrhoids: Secondary | ICD-10-CM | POA: Diagnosis not present

## 2022-07-08 DIAGNOSIS — Z7901 Long term (current) use of anticoagulants: Secondary | ICD-10-CM | POA: Diagnosis not present

## 2022-07-08 DIAGNOSIS — D125 Benign neoplasm of sigmoid colon: Secondary | ICD-10-CM | POA: Insufficient documentation

## 2022-07-08 DIAGNOSIS — Z87891 Personal history of nicotine dependence: Secondary | ICD-10-CM

## 2022-07-08 DIAGNOSIS — I4891 Unspecified atrial fibrillation: Secondary | ICD-10-CM | POA: Diagnosis not present

## 2022-07-08 HISTORY — PX: POLYPECTOMY: SHX5525

## 2022-07-08 HISTORY — PX: COLONOSCOPY WITH PROPOFOL: SHX5780

## 2022-07-08 SURGERY — COLONOSCOPY WITH PROPOFOL
Anesthesia: Monitor Anesthesia Care

## 2022-07-08 MED ORDER — PROPOFOL 500 MG/50ML IV EMUL
INTRAVENOUS | Status: DC | PRN
  Administered 2022-07-08: 140 ug/kg/min via INTRAVENOUS

## 2022-07-08 MED ORDER — LACTATED RINGERS IV SOLN: INTRAVENOUS | Status: DC | PRN

## 2022-07-08 MED ORDER — SODIUM CHLORIDE 0.9 % IV SOLN: INTRAVENOUS | Status: DC

## 2022-07-08 MED ORDER — PROPOFOL 10 MG/ML IV BOLUS
INTRAVENOUS | Status: DC | PRN
  Administered 2022-07-08: 40 mg via INTRAVENOUS

## 2022-07-08 SURGICAL SUPPLY — 22 items

## 2022-07-08 NOTE — Transfer of Care (Signed)
Immediate Anesthesia Transfer of Care Note  Patient: Caleb Barton  Procedure(s) Performed: COLONOSCOPY WITH PROPOFOL POLYPECTOMY  Patient Location: PACU and Endoscopy Unit  Anesthesia Type:MAC  Level of Consciousness: awake, alert , and oriented  Airway & Oxygen Therapy: Patient Spontanous Breathing  Post-op Assessment: Report given to RN and Post -op Vital signs reviewed and stable  Post vital signs: Reviewed and stable  Last Vitals:  Vitals Value Taken Time  BP 130/73 07/08/22 0923  Temp    Pulse 70 07/08/22 0925  Resp 14 07/08/22 0923  SpO2 96 % 07/08/22 0925  Vitals shown include unvalidated device data.  Last Pain:  Vitals:   07/08/22 0923  TempSrc:   PainSc: 0-No pain         Complications: No notable events documented.

## 2022-07-08 NOTE — Anesthesia Postprocedure Evaluation (Signed)
Anesthesia Post Note  Patient: Caleb Barton  Procedure(s) Performed: COLONOSCOPY WITH PROPOFOL POLYPECTOMY     Patient location during evaluation: Endoscopy Anesthesia Type: MAC Level of consciousness: awake and alert Pain management: pain level controlled Vital Signs Assessment: post-procedure vital signs reviewed and stable Respiratory status: spontaneous breathing, nonlabored ventilation, respiratory function stable and patient connected to nasal cannula oxygen Cardiovascular status: blood pressure returned to baseline and stable Postop Assessment: no apparent nausea or vomiting Anesthetic complications: no  No notable events documented.  Last Vitals:  Vitals:   07/08/22 0930 07/08/22 0940  BP: (!) 141/84 (!) 143/76  Pulse: 64 (!) 30  Resp: 15 13  Temp:    SpO2: 97% 98%    Last Pain:  Vitals:   07/08/22 0940  TempSrc:   PainSc: 0-No pain                 Vergia Chea L Tahirih Lair

## 2022-07-08 NOTE — Anesthesia Preprocedure Evaluation (Addendum)
Anesthesia Evaluation  Patient identified by MRN, date of birth, ID band Patient awake    Reviewed: Allergy & Precautions, NPO status , Patient's Chart, lab work & pertinent test results  Airway Mallampati: I  TM Distance: >3 FB Neck ROM: Full    Dental  (+) Dental Advisory Given, Chipped,    Pulmonary sleep apnea and Continuous Positive Airway Pressure Ventilation , former smoker   Pulmonary exam normal breath sounds clear to auscultation       Cardiovascular hypertension, Pt. on medications Normal cardiovascular exam+ dysrhythmias Atrial Fibrillation  Rhythm:Regular Rate:Normal  TTE 2021  1. Left ventricular ejection fraction, by estimation, is 55 to 60%. The  left ventricle has normal function. The left ventricle has no regional  wall motion abnormalities. There is indeterminate left ventricular  hypertrophy. Left ventricular diastolic  parameters are indeterminate.   2. Right ventricular systolic function is normal. The right ventricular  size is normal. Tricuspid regurgitation signal is inadequate for assessing  PA pressure.   3. Left atrial size was moderately dilated.   4. Right atrial size was moderately dilated.   5. The mitral valve is normal in structure. No evidence of mitral valve  regurgitation. No evidence of mitral stenosis.   6. The aortic valve was not well visualized. Aortic valve regurgitation  is not visualized. No aortic stenosis is present.   7. Aortic dilatation noted. 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   8. The inferior vena cava is normal in size with greater than 50%  respiratory variability, suggesting right atrial pressure of 3 mmHg.   Cath 2021  Mid RCA lesion is 30% stenosed.  Ost RCA lesion is 20% stenosed.  Ost LAD lesion is 20% stenosed.  Ramus lesion is 60% stenosed     Neuro/Psych CVA, No Residual Symptoms   negative psych ROS   GI/Hepatic negative GI ROS, Neg liver ROS,,,  Endo/Other  negative endocrine ROS    Renal/GU negative Renal ROS  negative genitourinary   Musculoskeletal  (+) Arthritis ,    Abdominal   Peds  Hematology  (+) Blood dyscrasia (xarelto)   Anesthesia Other Findings   Reproductive/Obstetrics                             Anesthesia Physical Anesthesia Plan  ASA: 3  Anesthesia Plan: MAC   Post-op Pain Management:    Induction: Intravenous  PONV Risk Score and Plan: Propofol infusion and Treatment may vary due to age or medical condition  Airway Management Planned: Natural Airway  Additional Equipment:   Intra-op Plan:   Post-operative Plan:   Informed Consent: I have reviewed the patients History and Physical, chart, labs and discussed the procedure including the risks, benefits and alternatives for the proposed anesthesia with the patient or authorized representative who has indicated his/her understanding and acceptance.     Dental advisory given  Plan Discussed with: CRNA  Anesthesia Plan Comments:        Anesthesia Quick Evaluation

## 2022-07-08 NOTE — Discharge Instructions (Signed)

## 2022-07-08 NOTE — Op Note (Signed)
[facility] Patient Name: Caleb Barton Procedure Date: 07/08/2022 MRN: [id number] Attending MD: Otis Brace , MD, [id number] Date of Birth: [birth year] CSN: [id number] Age: 82 Admit Type: Outpatient Procedure:                Colonoscopy Indications:              Last colonoscopy: 2018, Abnormal virtual colonoscopy Providers:                Otis Brace, MD, Jaci Carrel, RN, Gloris Ham, Technician Referring MD:              Medicines:                Sedation Administered by an Anesthesia Professional Complications:            No immediate complications. Estimated Blood Loss:     Estimated blood loss was minimal. Procedure:                Pre-Anesthesia Assessment:                           - Prior to the procedure, a History and Physical                            was performed, and patient medications and                            allergies were reviewed. The patient's tolerance of                            previous anesthesia was also reviewed. The risks                            and benefits of the procedure and the sedation                            options and risks were discussed with the patient.                            All questions were answered, and informed consent                            was obtained. Prior Anticoagulants: The patient has                            taken Xarelto (rivaroxaban), last dose was 2 days                            prior to procedure. ASA Grade Assessment: III - A                            patient with severe systemic disease. After  reviewing the risks and benefits, the patient was                            deemed in satisfactory condition to undergo the                            procedure.                           After obtaining informed consent, the colonoscope                            was passed under direct vision. Throughout the                             procedure, the patient's blood pressure, pulse, and                            oxygen saturations were monitored continuously. The                            [id number]) Olympus colonoscope was                            introduced through the anus and advanced to the the                            cecum, identified by appendiceal orifice and                            ileocecal valve. The colonoscopy was performed                            without difficulty. The patient tolerated the                            procedure well. The quality of the bowel                            preparation was fair. The ileocecal valve,                            appendiceal orifice, and rectum were photographed. Scope In: 8:50:01 AM Scope Out: 9:14:02 AM Scope Withdrawal Time: 0 hours 20 minutes 38 seconds  Total Procedure Duration: 0 hours 24 minutes 1 second  Findings:      Hemorrhoids were found on perianal exam.      A 15 mm polyp was found in the hepatic flexure. The polyp was       pedunculated. The polyp was removed with a hot snare. Resection and       retrieval were complete.      Two sessile polyps were found in the descending colon and transverse       colon. The polyps were small in size. These polyps were removed with a       cold snare. Resection and  retrieval were complete.      A 10 mm polyp was found in the sigmoid colon. The polyp was       multi-lobulated. The polyp was removed with a hot snare. Resection and       retrieval were complete.      A small polyp was found in the sigmoid colon. The polyp was sessile. The       polyp was removed with a cold snare. Resection and retrieval were       complete.      Internal hemorrhoids were found during retroflexion. The hemorrhoids       were medium-sized. Impression:               - Preparation of the colon was fair.                           - Hemorrhoids found on perianal exam.                           -  One 15 mm polyp at the hepatic flexure, removed                            with a hot snare. Resected and retrieved.                           - Two small polyps in the descending colon and in                            the transverse colon, removed with a cold snare.                            Resected and retrieved.                           - One 10 mm polyp in the sigmoid colon, removed                            with a hot snare. Resected and retrieved.                           - One small polyp in the sigmoid colon, removed                            with a cold snare. Resected and retrieved.                           - Internal hemorrhoids. Moderate Sedation:      Moderate (conscious) sedation was personally administered by an       anesthesia professional. The following parameters were monitored: oxygen       saturation, heart rate, blood pressure, and response to care. Recommendation:           - Patient has a contact number available for                            emergencies. The signs and symptoms  of potential                            delayed complications were discussed with the                            patient. Return to normal activities tomorrow.                            Written discharge instructions were provided to the                            patient.                           - Resume previous diet.                           - Continue present medications.                           - Await pathology results.                           - Repeat colonoscopy date to be determined after                            pending pathology results are reviewed for                            surveillance based on pathology results.                           - No ibuprofen, naproxen, or other non-steroidal                            anti-inflammatory drugs for 7 days after polyp                            removal. Procedure Code(s):        --- Professional ---                            [phone], Colonoscopy, flexible; with removal of                            tumor(s), polyp(s), or other lesion(s) by snare                            technique Diagnosis Code(s):        --- Professional ---                           K64.8, Other hemorrhoids                           D12.3, Benign neoplasm of transverse colon (hepatic  flexure or splenic flexure)                           D12.5, Benign neoplasm of sigmoid colon                           D12.4, Benign neoplasm of descending colon                           R93.3, Abnormal findings on diagnostic imaging of                            other parts of digestive tract CPT copyright 2022 [employer]. All rights reserved. The codes documented in this report are preliminary and upon coder review may  be revised to meet current compliance requirements. Otis Brace, MD Otis Brace, MD 07/08/2022 9:26:20 AM Number of Addenda: 0

## 2022-07-08 NOTE — H&P (Signed)
Primary Care Physician:  Glenis Smoker, MD Primary Gastroenterologist:  [facility]   Reason for Consultation: Personal history of colon polyps  HPI: Caleb Barton is a 82 y.o. male past medical history of CVA, history of arrhythmia, personal history of colon polyps here for outpatient surveillance colonoscopy because of virtual colonoscopy showing 12 mm polyp at hepatic flexure.  Colonoscopy in 2018 showed 2 tubular adenomas.  Patient denies any GI symptoms.   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    Sleep apnea    cpap;sleep study done at home;request report from dr.gates   Stroke [facility])    TIA 15 yrs ago    Past Surgical History:  Procedure Laterality Date   CARDIOVERSION N/A 10/14/2016   Procedure: CARDIOVERSION;  Surgeon: Larey Dresser, MD;  Location: [facility] ENDOSCOPY;  Service: Cardiovascular;  Laterality: N/A;   cataracts both eyes     ioc lens   COLONOSCOPY     with polyps   COLONOSCOPY  08/20/2011   Procedure: COLONOSCOPY;  Surgeon: Winfield Cunas., MD;  Location: [facility] ENDOSCOPY;  Service: Endoscopy;  Laterality: N/A;   COLONOSCOPY WITH PROPOFOL N/A 12/17/2016   Procedure: COLONOSCOPY WITH PROPOFOL;  Surgeon: Laurence Spates, MD;  Location: [facility] ENDOSCOPY;  Service: Endoscopy;  Laterality: N/A;   EYE SURGERY     laser bilateral eyes   KNEE ARTHROSCOPY  x 2 right knee   LEFT HEART CATH AND CORONARY ANGIOGRAPHY N/A 07/29/2019   Procedure: LEFT HEART CATH AND CORONARY ANGIOGRAPHY;  Surgeon: Wellington Hampshire, MD;  Location: [place] CV LAB;  Service: Cardiovascular;  Laterality: N/A;   LUMBAR LAMINECTOMY/DECOMPRESSION MICRODISCECTOMY Right 09/09/2012   Procedure: LUMBAR LAMINECTOMY/DECOMPRESSION MICRODISCECTOMY 1  LEVEL;  Surgeon: Eustace Moore, MD;  Location: [place] NEURO ORS;  Service: Neurosurgery;  Laterality: Right;  LUMBAR LAMINECTOMY/DECOMPRESSION MICRODISCECTOMY 1 LEVEL   TONSILLECTOMY  yrs ago   TOTAL KNEE ARTHROPLASTY  02/09/2012   Procedure: TOTAL KNEE ARTHROPLASTY;  Surgeon: Lorn Junes, MD;  Location: [place];  Service: Orthopedics;  Laterality: Right;   TOTAL KNEE ARTHROPLASTY Left 07/01/2021   Procedure: TOTAL KNEE ARTHROPLASTY;  Surgeon: Willaim Sheng, MD;  Location: [facility] ORS;  Service: Orthopedics;  Laterality: Left;    Prior to Admission medications   Medication Sig Start Date End Date Taking? Authorizing Provider  Ascorbic Acid (VITAMIN C) 1000 MG tablet Take 1,000 mg by mouth daily.   Yes [provider]  atorvastatin (LIPITOR) 40 MG tablet Take 40 mg by mouth daily.   Yes [provider]  Azelastine HCl 137 MCG/SPRAY SOLN Place 1 puff into both nostrils 2 (two) times daily as needed (allergies). 06/26/20  Yes [provider]  dorzolamide-timolol (COSOPT) 22.3-6.8 MG/ML ophthalmic solution Place 1 drop into both eyes 2 (two) times daily.  09/18/19  Yes [provider]  hydrochlorothiazide (HYDRODIURIL) 25 MG tablet Take 37.5 mg by mouth daily.   Yes [provider]  latanoprost (XALATAN) 0.005 % ophthalmic solution Place 1 drop into both eyes at bedtime.    Yes [provider]  losartan (COZAAR) 50 MG tablet TAKE ONE AND ONE-HALF TABLET BY MOUTH DAILY Patient taking  differently: Take 50 mg by mouth daily. 12/02/21  Yes Martinique, Peter M, MD  methocarbamol (ROBAXIN) 500 MG tablet Take 500 mg by mouth every 6 (six) hours as needed. 09/17/21  Yes [provider]  Multiple Vitamin (MULTIVITAMIN WITH MINERALS) TABS tablet Take 1 tablet by mouth daily. Centrum silver   Yes [provider]  Potassium 99 MG TABS Take 99 mg by mouth daily.   Yes [provider]  RESTASIS 0.05 % ophthalmic emulsion Place 1 drop into both  eyes daily as needed (Dey eyes). 07/07/19  Yes [provider]  rivaroxaban (XARELTO) 20 MG TABS tablet Take 1 tablet (20 mg total) by mouth daily with supper. 01/29/22  Yes Martinique, Peter M, MD  tamsulosin (FLOMAX) 0.4 MG CAPS capsule Take 0.4 mg by mouth daily. 06/15/19  Yes [provider]  amoxicillin (AMOXIL) 250 MG capsule Take 1,000 mg by mouth See admin instructions. Take 1000 mg by mouth 1 hour prior to dental appointments    [provider]    Scheduled Meds: Continuous Infusions:  sodium chloride     PRN Meds:.  Allergies as of 07/02/2022 - Review Complete 06/04/2022  Allergen Reaction Noted   Lisinopril Cough 11/18/2013   Sulfa antibiotics Rash 08/20/2011    Family History  Problem Relation Age of Onset   Stroke Mother    Breast cancer Mother    Bladder Cancer Brother    Breast cancer Other    Prostate cancer Neg Hx    Pancreatic cancer Neg Hx     Social History   Socioeconomic History   Marital status: Married    Spouse name: [name]   Number of children: 2   Years of education: Not on file   Highest education level: Not on file  Occupational History    Comment: retired  Tobacco Use   Smoking status: Former    Packs/day: 1.00    Years: 20.00    Additional pack years: 0.00    Total pack years: 20.00    Types: Cigarettes   Smokeless tobacco: Never   Tobacco comments:    quit 35 yrs ago  Vaping Use   Vaping Use: Never used  Substance and Sexual Activity   Alcohol use: Yes    Comment: social   Drug use: No   Sexual activity: Yes  Other Topics Concern   Not on file  Social History Narrative   Not on file   Social Determinants of Health   Financial Resource Strain: Not on file  Food Insecurity: Not on file  Transportation Needs: Not on file  Physical Activity: Not on file  Stress: Not on file  Social Connections: Not on file  Intimate Partner Violence: Not on file    Review of Systems: All negative except as stated  above in HPI.  Physical Exam: Vital signs: Vitals:   07/08/22 0731  BP: (!) 166/81  Pulse: 70  Resp: 13  Temp: 98.3 F (36.8 C)  SpO2: 96%     General:   Alert,  Well-developed, well-nourished, pleasant and cooperative in NAD Lungs: No visible respiratory distress Heart: Regular rate and rhythm Abdomen: Soft, nontender, nondistended, bowel sounds present, no peritoneal signs Rectal:  Deferred  GI:  Lab Results: No results for input(s): "WBC", "HGB", "HCT", "PLT" in the last 72 hours. BMET No results for input(s): "NA", "K", "CL", "CO2", "GLUCOSE", "BUN", "CREATININE", "CALCIUM" in the last 72 hours. LFT No results for input(s): "PROT", "ALBUMIN", "AST", "ALT", "ALKPHOS", "BILITOT", "BILIDIR", "IBILI"  in the last 72 hours. PT/INR No results for input(s): "LABPROT", "INR" in the last 72 hours.   Studies/Results: No results found.  Impression/Plan: -Abnormal virtual colonoscopy and personal history of colon polyps -History of anticoagulation use.  Last used 2 days ago.  Recommendations --------------------------- -Proceed with colonoscopy today.  Risks (bleeding, infection, bowel perforation that could require surgery, sedation-related changes in cardiopulmonary systems), benefits (identification and possible treatment of source of symptoms, exclusion of certain causes of symptoms), and alternatives (watchful waiting, radiographic imaging studies, empiric medical treatment)  were explained to patient/family in detail and patient wishes to proceed.     LOS: 0 days   Otis Brace  MD, FACP 07/08/2022, 8:28 AM  Contact #  [phone]

## 2022-07-09 LAB — SURGICAL PATHOLOGY

## 2022-07-17 ENCOUNTER — Inpatient Hospital Stay (HOSPITAL_COMMUNITY)
Admission: EM | Admit: 2022-07-17 | Discharge: 2022-07-28 | DRG: 485 | Disposition: A | Discharge status: Skilled Nursing Facility | Payer: Medicare HMO | Attending: Internal Medicine | Admitting: Internal Medicine

## 2022-07-17 ENCOUNTER — Other Ambulatory Visit: Payer: Self-pay

## 2022-07-17 ENCOUNTER — Emergency Department (HOSPITAL_COMMUNITY): Payer: Medicare HMO

## 2022-07-17 ENCOUNTER — Encounter (HOSPITAL_COMMUNITY): Payer: Self-pay

## 2022-07-17 DIAGNOSIS — M48061 Spinal stenosis, lumbar region without neurogenic claudication: Secondary | ICD-10-CM | POA: Diagnosis not present

## 2022-07-17 DIAGNOSIS — R791 Abnormal coagulation profile: Secondary | ICD-10-CM | POA: Diagnosis present

## 2022-07-17 DIAGNOSIS — Z923 Personal history of irradiation: Secondary | ICD-10-CM

## 2022-07-17 DIAGNOSIS — Z87891 Personal history of nicotine dependence: Secondary | ICD-10-CM

## 2022-07-17 DIAGNOSIS — C61 Malignant neoplasm of prostate: Secondary | ICD-10-CM | POA: Diagnosis not present

## 2022-07-17 DIAGNOSIS — R7401 Elevation of levels of liver transaminase levels: Secondary | ICD-10-CM | POA: Diagnosis present

## 2022-07-17 DIAGNOSIS — T8453XA Infection and inflammatory reaction due to internal right knee prosthesis, initial encounter: Principal | ICD-10-CM | POA: Diagnosis present

## 2022-07-17 DIAGNOSIS — I480 Paroxysmal atrial fibrillation: Secondary | ICD-10-CM | POA: Diagnosis present

## 2022-07-17 DIAGNOSIS — E7800 Pure hypercholesterolemia, unspecified: Secondary | ICD-10-CM | POA: Diagnosis not present

## 2022-07-17 DIAGNOSIS — I10 Essential (primary) hypertension: Secondary | ICD-10-CM | POA: Diagnosis present

## 2022-07-17 DIAGNOSIS — M25561 Pain in right knee: Secondary | ICD-10-CM | POA: Insufficient documentation

## 2022-07-17 DIAGNOSIS — Z882 Allergy status to sulfonamides status: Secondary | ICD-10-CM

## 2022-07-17 DIAGNOSIS — Z79899 Other long term (current) drug therapy: Secondary | ICD-10-CM

## 2022-07-17 DIAGNOSIS — R29898 Other symptoms and signs involving the musculoskeletal system: Secondary | ICD-10-CM | POA: Diagnosis present

## 2022-07-17 DIAGNOSIS — M6282 Rhabdomyolysis: Secondary | ICD-10-CM | POA: Diagnosis present

## 2022-07-17 DIAGNOSIS — M545 Low back pain, unspecified: Secondary | ICD-10-CM | POA: Diagnosis present

## 2022-07-17 DIAGNOSIS — Z8546 Personal history of malignant neoplasm of prostate: Secondary | ICD-10-CM

## 2022-07-17 DIAGNOSIS — J189 Pneumonia, unspecified organism: Secondary | ICD-10-CM | POA: Diagnosis not present

## 2022-07-17 DIAGNOSIS — Z66 Do not resuscitate: Secondary | ICD-10-CM | POA: Diagnosis present

## 2022-07-17 DIAGNOSIS — M1711 Unilateral primary osteoarthritis, right knee: Secondary | ICD-10-CM | POA: Diagnosis present

## 2022-07-17 DIAGNOSIS — E8770 Fluid overload, unspecified: Secondary | ICD-10-CM | POA: Diagnosis not present

## 2022-07-17 DIAGNOSIS — N400 Enlarged prostate without lower urinary tract symptoms: Secondary | ICD-10-CM | POA: Diagnosis present

## 2022-07-17 DIAGNOSIS — Z823 Family history of stroke: Secondary | ICD-10-CM

## 2022-07-17 DIAGNOSIS — Y831 Surgical operation with implant of artificial internal device as the cause of abnormal reaction of the patient, or of later complication, without mention of misadventure at the time of the procedure: Secondary | ICD-10-CM | POA: Diagnosis present

## 2022-07-17 DIAGNOSIS — Z8673 Personal history of transient ischemic attack (TIA), and cerebral infarction without residual deficits: Secondary | ICD-10-CM

## 2022-07-17 DIAGNOSIS — E860 Dehydration: Secondary | ICD-10-CM | POA: Diagnosis present

## 2022-07-17 DIAGNOSIS — Z803 Family history of malignant neoplasm of breast: Secondary | ICD-10-CM

## 2022-07-17 DIAGNOSIS — M25461 Effusion, right knee: Secondary | ICD-10-CM | POA: Diagnosis present

## 2022-07-17 DIAGNOSIS — Y92009 Unspecified place in unspecified non-institutional (private) residence as the place of occurrence of the external cause: Secondary | ICD-10-CM

## 2022-07-17 DIAGNOSIS — E785 Hyperlipidemia, unspecified: Secondary | ICD-10-CM | POA: Diagnosis present

## 2022-07-17 DIAGNOSIS — T8450XA Infection and inflammatory reaction due to unspecified internal joint prosthesis, initial encounter: Secondary | ICD-10-CM

## 2022-07-17 DIAGNOSIS — R531 Weakness: Secondary | ICD-10-CM | POA: Diagnosis not present

## 2022-07-17 DIAGNOSIS — I452 Bifascicular block: Secondary | ICD-10-CM | POA: Diagnosis present

## 2022-07-17 DIAGNOSIS — R7989 Other specified abnormal findings of blood chemistry: Secondary | ICD-10-CM | POA: Diagnosis present

## 2022-07-17 DIAGNOSIS — I639 Cerebral infarction, unspecified: Secondary | ICD-10-CM | POA: Diagnosis present

## 2022-07-17 DIAGNOSIS — Z7901 Long term (current) use of anticoagulants: Secondary | ICD-10-CM

## 2022-07-17 DIAGNOSIS — Z743 Need for continuous supervision: Secondary | ICD-10-CM | POA: Diagnosis not present

## 2022-07-17 DIAGNOSIS — M549 Dorsalgia, unspecified: Secondary | ICD-10-CM | POA: Diagnosis not present

## 2022-07-17 DIAGNOSIS — R5381 Other malaise: Secondary | ICD-10-CM | POA: Diagnosis present

## 2022-07-17 DIAGNOSIS — T464X5A Adverse effect of angiotensin-converting-enzyme inhibitors, initial encounter: Secondary | ICD-10-CM | POA: Diagnosis present

## 2022-07-17 DIAGNOSIS — I739 Peripheral vascular disease, unspecified: Secondary | ICD-10-CM | POA: Diagnosis present

## 2022-07-17 DIAGNOSIS — G4733 Obstructive sleep apnea (adult) (pediatric): Secondary | ICD-10-CM | POA: Diagnosis present

## 2022-07-17 DIAGNOSIS — B955 Unspecified streptococcus as the cause of diseases classified elsewhere: Secondary | ICD-10-CM | POA: Diagnosis present

## 2022-07-17 DIAGNOSIS — Z8052 Family history of malignant neoplasm of bladder: Secondary | ICD-10-CM

## 2022-07-17 DIAGNOSIS — W19XXXA Unspecified fall, initial encounter: Secondary | ICD-10-CM | POA: Diagnosis present

## 2022-07-17 DIAGNOSIS — Z96652 Presence of left artificial knee joint: Secondary | ICD-10-CM | POA: Diagnosis present

## 2022-07-17 DIAGNOSIS — Z1152 Encounter for screening for COVID-19: Secondary | ICD-10-CM

## 2022-07-17 DIAGNOSIS — R053 Chronic cough: Secondary | ICD-10-CM | POA: Diagnosis present

## 2022-07-17 DIAGNOSIS — A400 Sepsis due to streptococcus, group A: Secondary | ICD-10-CM | POA: Diagnosis present

## 2022-07-17 DIAGNOSIS — E872 Acidosis, unspecified: Secondary | ICD-10-CM | POA: Diagnosis present

## 2022-07-17 DIAGNOSIS — G4730 Sleep apnea, unspecified: Secondary | ICD-10-CM | POA: Diagnosis not present

## 2022-07-17 DIAGNOSIS — R0602 Shortness of breath: Secondary | ICD-10-CM | POA: Diagnosis not present

## 2022-07-17 LAB — CBC WITH DIFFERENTIAL/PLATELET
Abs Immature Granulocytes: 0.14 10*3/uL — ABNORMAL HIGH (ref 0.00–0.07)
Basophils Absolute: 0.1 10*3/uL (ref 0.0–0.1)
Basophils Relative: 0 %
Eosinophils Absolute: 0 10*3/uL (ref 0.0–0.5)
Eosinophils Relative: 0 %
HCT: 47.2 % (ref 39.0–52.0)
Hemoglobin: 16 g/dL (ref 13.0–17.0)
Immature Granulocytes: 1 %
Lymphocytes Relative: 3 %
Lymphs Abs: 0.5 10*3/uL — ABNORMAL LOW (ref 0.7–4.0)
MCH: 32.5 pg (ref 26.0–34.0)
MCHC: 33.9 g/dL (ref 30.0–36.0)
MCV: 95.9 fL (ref 80.0–100.0)
Monocytes Absolute: 0.8 10*3/uL (ref 0.1–1.0)
Monocytes Relative: 5 %
Neutro Abs: 14.9 10*3/uL — ABNORMAL HIGH (ref 1.7–7.7)
Neutrophils Relative %: 91 %
Platelets: 159 10*3/uL (ref 150–400)
RBC: 4.92 MIL/uL (ref 4.22–5.81)
RDW: 14.1 % (ref 11.5–15.5)
WBC: 16.3 10*3/uL — ABNORMAL HIGH (ref 4.0–10.5)
nRBC: 0 % (ref 0.0–0.2)

## 2022-07-17 LAB — URINALYSIS, ROUTINE W REFLEX MICROSCOPIC
Bilirubin Urine: NEGATIVE
Glucose, UA: NEGATIVE mg/dL
Ketones, ur: NEGATIVE mg/dL
Leukocytes,Ua: NEGATIVE
Nitrite: NEGATIVE
Protein, ur: 100 mg/dL — AB
Specific Gravity, Urine: 1.028 (ref 1.005–1.030)
pH: 5 (ref 5.0–8.0)

## 2022-07-17 LAB — BASIC METABOLIC PANEL
Anion gap: 13 (ref 5–15)
BUN: 17 mg/dL (ref 8–23)
CO2: 26 mmol/L (ref 22–32)
Calcium: 9 mg/dL (ref 8.9–10.3)
Chloride: 96 mmol/L — ABNORMAL LOW (ref 98–111)
Creatinine, Ser: 1.07 mg/dL (ref 0.61–1.24)
GFR, Estimated: >60 mL/min (ref >60)
Glucose, Bld: 125 mg/dL — ABNORMAL HIGH (ref 70–99)
Potassium: 3.2 mmol/L — ABNORMAL LOW (ref 3.5–5.1)
Sodium: 135 mmol/L (ref 135–145)

## 2022-07-17 LAB — LACTIC ACID, PLASMA
Lactic Acid, Venous: 2.3 mmol/L (ref 0.5–1.9)
Lactic Acid, Venous: 1.8 mmol/L (ref 0.5–1.9)

## 2022-07-17 LAB — MAGNESIUM: Magnesium: 1.8 mg/dL (ref 1.7–2.4)

## 2022-07-17 LAB — CK: Total CK: 3833 U/L — ABNORMAL HIGH (ref 49–397)

## 2022-07-17 LAB — SARS CORONAVIRUS 2 BY RT PCR: SARS Coronavirus 2 by RT PCR: NEGATIVE

## 2022-07-17 MED ORDER — DORZOLAMIDE HCL-TIMOLOL MAL 2-0.5 % OP SOLN
1.0000 [drp] | Freq: Two times a day (BID) | OPHTHALMIC | Status: DC
  Administered 2022-07-17 – 2022-07-28 (×21): 1 [drp] via OPHTHALMIC
  Filled 2022-07-17: qty 10

## 2022-07-17 MED ORDER — ACETAMINOPHEN 650 MG RE SUPP: 650.0000 mg | Freq: Four times a day (QID) | RECTAL | Status: DC | PRN

## 2022-07-17 MED ORDER — ADULT MULTIVITAMIN W/MINERALS CH
1.0000 | ORAL_TABLET | Freq: Every day | ORAL | Status: DC
  Administered 2022-07-17 – 2022-07-28 (×10): 1 via ORAL
  Filled 2022-07-17 (×10): qty 1

## 2022-07-17 MED ORDER — MORPHINE SULFATE (PF) 4 MG/ML IV SOLN
6.0000 mg | Freq: Once | INTRAVENOUS | Status: AC
  Administered 2022-07-17: 6 mg via INTRAVENOUS
  Filled 2022-07-17: qty 2

## 2022-07-17 MED ORDER — ALBUTEROL SULFATE (2.5 MG/3ML) 0.083% IN NEBU
2.5000 mg | INHALATION_SOLUTION | RESPIRATORY_TRACT | Status: DC | PRN
  Administered 2022-07-19: 2.5 mg via RESPIRATORY_TRACT
  Filled 2022-07-17: qty 3

## 2022-07-17 MED ORDER — SORBITOL 70 % SOLN
30.0000 mL | Freq: Every day | Status: DC | PRN
  Administered 2022-07-22: 30 mL via ORAL
  Filled 2022-07-17: qty 30

## 2022-07-17 MED ORDER — LATANOPROST 0.005 % OP SOLN
1.0000 [drp] | Freq: Every day | OPHTHALMIC | Status: DC
  Administered 2022-07-17 – 2022-07-27 (×11): 1 [drp] via OPHTHALMIC
  Filled 2022-07-17: qty 2.5

## 2022-07-17 MED ORDER — LOSARTAN POTASSIUM 50 MG PO TABS
50.0000 mg | ORAL_TABLET | Freq: Every day | ORAL | Status: DC
  Administered 2022-07-17 – 2022-07-23 (×7): 50 mg via ORAL
  Filled 2022-07-17: qty 2
  Filled 2022-07-17 (×2): qty 1
  Filled 2022-07-17 (×2): qty 2
  Filled 2022-07-17 (×2): qty 1

## 2022-07-17 MED ORDER — CYCLOSPORINE 0.05 % OP EMUL: 1.0000 [drp] | Freq: Every day | OPHTHALMIC | Status: DC | PRN

## 2022-07-17 MED ORDER — METHOCARBAMOL 1000 MG/10ML IJ SOLN
500.0000 mg | Freq: Once | INTRAVENOUS | Status: AC
  Administered 2022-07-17: 500 mg via INTRAVENOUS
  Filled 2022-07-17: qty 500

## 2022-07-17 MED ORDER — TAMSULOSIN HCL 0.4 MG PO CAPS
0.4000 mg | ORAL_CAPSULE | Freq: Every day | ORAL | Status: DC
  Administered 2022-07-17 – 2022-07-28 (×10): 0.4 mg via ORAL
  Filled 2022-07-17 (×10): qty 1

## 2022-07-17 MED ORDER — LACTATED RINGERS IV BOLUS
1000.0000 mL | Freq: Once | INTRAVENOUS | Status: AC
  Administered 2022-07-17: 1000 mL via INTRAVENOUS

## 2022-07-17 MED ORDER — SODIUM CHLORIDE 0.9 % IV SOLN
500.0000 mg | INTRAVENOUS | Status: DC
  Administered 2022-07-17: 500 mg via INTRAVENOUS
  Filled 2022-07-17: qty 5

## 2022-07-17 MED ORDER — MORPHINE SULFATE (PF) 2 MG/ML IV SOLN
2.0000 mg | INTRAVENOUS | Status: DC | PRN
  Administered 2022-07-18 – 2022-07-28 (×8): 2 mg via INTRAVENOUS
  Filled 2022-07-17 (×8): qty 1

## 2022-07-17 MED ORDER — METHOCARBAMOL 500 MG PO TABS
500.0000 mg | ORAL_TABLET | Freq: Four times a day (QID) | ORAL | Status: DC | PRN
  Administered 2022-07-18 – 2022-07-22 (×4): 500 mg via ORAL
  Filled 2022-07-17 (×4): qty 1

## 2022-07-17 MED ORDER — ACETAMINOPHEN 325 MG PO TABS
650.0000 mg | ORAL_TABLET | Freq: Once | ORAL | Status: AC
  Administered 2022-07-17: 650 mg via ORAL
  Filled 2022-07-17: qty 2

## 2022-07-17 MED ORDER — SODIUM CHLORIDE 0.9 % IV SOLN
1.0000 g | INTRAVENOUS | Status: DC
  Administered 2022-07-17: 1 g via INTRAVENOUS
  Filled 2022-07-17: qty 10

## 2022-07-17 MED ORDER — POLYETHYLENE GLYCOL 3350 17 G PO PACK
17.0000 g | PACK | Freq: Every day | ORAL | Status: DC | PRN
  Administered 2022-07-17 – 2022-07-26 (×5): 17 g via ORAL
  Filled 2022-07-17 (×3): qty 1

## 2022-07-17 MED ORDER — LACTATED RINGERS IV SOLN: INTRAVENOUS | Status: DC

## 2022-07-17 MED ORDER — VITAMIN C 500 MG PO TABS
1000.0000 mg | ORAL_TABLET | Freq: Every day | ORAL | Status: DC
  Administered 2022-07-17 – 2022-07-28 (×10): 1000 mg via ORAL
  Filled 2022-07-17 (×11): qty 2

## 2022-07-17 MED ORDER — ONDANSETRON HCL 4 MG/2ML IJ SOLN: 4.0000 mg | Freq: Four times a day (QID) | INTRAMUSCULAR | Status: DC | PRN

## 2022-07-17 MED ORDER — HYDRALAZINE HCL 20 MG/ML IJ SOLN: 10.0000 mg | Freq: Four times a day (QID) | INTRAMUSCULAR | Status: DC | PRN

## 2022-07-17 MED ORDER — POTASSIUM CHLORIDE CRYS ER 20 MEQ PO TBCR
40.0000 meq | EXTENDED_RELEASE_TABLET | ORAL | Status: AC
  Administered 2022-07-17: 40 meq via ORAL
  Filled 2022-07-17: qty 2

## 2022-07-17 MED ORDER — SODIUM CHLORIDE 0.9 % IV SOLN
2.0000 g | INTRAVENOUS | Status: DC
  Filled 2022-07-17: qty 20

## 2022-07-17 MED ORDER — RIVAROXABAN 20 MG PO TABS
20.0000 mg | ORAL_TABLET | Freq: Every day | ORAL | Status: DC
  Administered 2022-07-17: 20 mg via ORAL
  Filled 2022-07-17: qty 1

## 2022-07-17 MED ORDER — AZELASTINE HCL 137 MCG/SPRAY NA SOLN: 1.0000 | Freq: Two times a day (BID) | NASAL | Status: DC | PRN

## 2022-07-17 MED ORDER — SODIUM CHLORIDE 0.9 % IV SOLN
500.0000 mg | INTRAVENOUS | Status: DC
  Filled 2022-07-17: qty 5

## 2022-07-17 MED ORDER — GADOBUTROL 1 MMOL/ML IV SOLN
10.0000 mL | Freq: Once | INTRAVENOUS | Status: AC | PRN
  Administered 2022-07-17: 10 mL via INTRAVENOUS

## 2022-07-17 MED ORDER — OXYCODONE HCL 5 MG PO TABS
5.0000 mg | ORAL_TABLET | ORAL | Status: DC | PRN
  Administered 2022-07-19 – 2022-07-28 (×18): 5 mg via ORAL
  Filled 2022-07-17 (×18): qty 1

## 2022-07-17 MED ORDER — ONDANSETRON HCL 4 MG PO TABS: 4.0000 mg | ORAL_TABLET | Freq: Four times a day (QID) | ORAL | Status: DC | PRN

## 2022-07-17 MED ORDER — SODIUM CHLORIDE 0.9% FLUSH
3.0000 mL | Freq: Two times a day (BID) | INTRAVENOUS | Status: DC
  Administered 2022-07-17 – 2022-07-28 (×14): 3 mL via INTRAVENOUS

## 2022-07-17 MED ORDER — ACETAMINOPHEN 325 MG PO TABS
650.0000 mg | ORAL_TABLET | Freq: Four times a day (QID) | ORAL | Status: DC | PRN
  Administered 2022-07-17 – 2022-07-18 (×2): 650 mg via ORAL
  Filled 2022-07-17 (×2): qty 2

## 2022-07-17 NOTE — ED Triage Notes (Signed)
Pt BIB EMS with c/o bilaterally leg weakness. Hx arthritis of lumbar. Difficulty walking this morning. Denies falls or LOC. Pt states his legs are "really weak and feels I can not walk".  BP 144/68 HR 88 O2 94% RA CG 154 RR 18

## 2022-07-17 NOTE — H&P (Signed)
History and Physical    Caleb Barton [id number] DOB: [birth year] DOA: 07/17/2022  PCP: Glenis Smoker, MD  Patient coming from: Home  I have personally briefly reviewed patient's old medical records in [place]  Chief Complaint: Lower extremity weakness.  Low back pain.  HPI: Caleb Barton is a 82 y.o. male with medical history significant of paroxysmal atrial fibrillation/LAFB/RBBB, hypertension, hyperlipidemia, OSA on CPAP, remote history of thalamic CVA, BPH, history of prostate cancer status post radiation treatment approximately a year ago being followed by [facility], Dr. Jeffie Pollock, presenting to the ED with sudden onset lower extremity weakness and worsening low back pain.  Patient stated had an ablation of the nerves done in his lower back by neurosurgery, Dr.Eichman approximately 2 weeks ago, his lower back felt great and was able to ambulate without the aid of a walker.  Patient noted suddenly last night when he got up to go to the bathroom legs suddenly got significantly weak and he fell to the ground.  Per wife patient laid on the ground from approximately 5 AM to 8 AM until she was able to call her son who came by set him up and EMS was called and patient brought to the ED.  Patient denies any fevers, no chills, no nausea, no vomiting, no chest pain, no cough, no abdominal pain, no melena, no hematemesis, no hematochezia.  Patient does endorse generalized weakness.  Denies any lightheadedness.  Patient denies any numbness or tingling in his lower extremities.  Patient denies any bowel or bladder incontinence.  ED Course: Patient assessed in the ED, basic metabolic profile obtained with a potassium of 3.2, chloride of 96, glucose of 125 otherwise within normal limits.  Lactic acid noted to be elevated at 2.3.  CBC with a white count of 16.3 with a left shift.  EKG not done.  Urinalysis not done.  COVID-19 PCR negative.  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  L3-L4 severe left and mild to moderate right neuroforaminal narrowing which has progressed on the right.  L4-L5 mild spinal canal stenosis and moderate bilateral neural foraminal narrowing.  L5-S1 mild right neuroforaminal narrowing.  Narrowing of the lateral recesses at L2-L3, L3-L4, L4-L5, and L5-S1 could affect the descending L3, L4, L5 and S1 nerve roots respectively.  ED physician discussed with neurosurgery who reviewed films and did not feel any surgical intervention needed at this time and recommended pain management and PT OT.  Patient in the ED noted to have a temp of 101.3, chest x-ray done concerning for bibasilar multifocal pneumonia.  Patient given IV antibiotics.  Hospitalist called to admit for further evaluation and management.  Review of Systems: As per HPI otherwise all other systems reviewed and are negative.  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 ENDOSCOPY;  Service: Cardiovascular;  Laterality: N/A;   cataracts both eyes     ioc lens   COLONOSCOPY     with polyps   COLONOSCOPY  08/20/2011   Procedure: COLONOSCOPY;  Surgeon: Winfield Cunas., MD;  Location: [facility] ENDOSCOPY;  Service: Endoscopy;  Laterality: N/A;   COLONOSCOPY WITH PROPOFOL N/A 12/17/2016   Procedure: COLONOSCOPY WITH PROPOFOL;  Surgeon: Laurence Spates, MD;  Location: [facility]  ENDOSCOPY;  Service: Endoscopy;  Laterality: N/A;   COLONOSCOPY WITH PROPOFOL N/A 07/08/2022   Procedure: COLONOSCOPY WITH PROPOFOL;  Surgeon: Otis Brace, MD;  Location: [facility] ENDOSCOPY;  Service: Gastroenterology;  Laterality: N/A;   EYE SURGERY     laser bilateral eyes   KNEE ARTHROSCOPY  x 2 right knee   LEFT HEART CATH AND CORONARY ANGIOGRAPHY N/A 07/29/2019   Procedure: LEFT HEART CATH AND CORONARY ANGIOGRAPHY;  Surgeon: Wellington Hampshire, MD;  Location: [facility] CV LAB;  Service: Cardiovascular;  Laterality: N/A;   LUMBAR LAMINECTOMY/DECOMPRESSION MICRODISCECTOMY Right 09/09/2012   Procedure: LUMBAR LAMINECTOMY/DECOMPRESSION MICRODISCECTOMY 1 LEVEL;  Surgeon: Eustace Moore, MD;  Location: [place] NEURO ORS;  Service: Neurosurgery;  Laterality: Right;  LUMBAR LAMINECTOMY/DECOMPRESSION MICRODISCECTOMY 1 LEVEL   POLYPECTOMY  07/08/2022   Procedure: POLYPECTOMY;  Surgeon: Otis Brace, MD;  Location: [facility] ENDOSCOPY;  Service: Gastroenterology;;   TONSILLECTOMY  yrs ago   TOTAL KNEE ARTHROPLASTY  02/09/2012   Procedure: TOTAL KNEE ARTHROPLASTY;  Surgeon: Lorn Junes, MD;  Location: [place];  Service: Orthopedics;  Laterality: Right;   TOTAL KNEE ARTHROPLASTY Left 07/01/2021   Procedure: TOTAL KNEE ARTHROPLASTY;  Surgeon: Willaim Sheng, MD;  Location: [facility] ORS;  Service: Orthopedics;  Laterality: Left;    Social History  reports that he has quit smoking. His smoking use included cigarettes. He has a 20.00 pack-year smoking history. He has never used smokeless tobacco. He reports current alcohol use. He reports that he does not use drugs.  Allergies  Allergen Reactions   Lisinopril Cough   Sulfa Antibiotics Rash    Family History  Problem Relation Age of Onset   Stroke Mother    Breast cancer Mother    Bladder Cancer Brother    Breast cancer Other    Prostate cancer Neg Hx    Pancreatic cancer Neg Hx    Father deceased age 92 from a drowning.  Mother deceased in the 43s from  a stroke.  Prior to Admission medications   Medication Sig Start Date End Date Taking? Authorizing Provider  amoxicillin (AMOXIL) 250 MG capsule Take 1,000 mg by mouth See admin instructions. Take 1000 mg by mouth 1 hour prior to dental appointments    [provider]  Ascorbic Acid (VITAMIN C) 1000 MG tablet Take 1,000 mg by mouth daily.    [provider]  atorvastatin (LIPITOR) 40 MG tablet Take 40 mg by mouth daily.    [provider]  Azelastine HCl 137 MCG/SPRAY SOLN Place 1 puff into both nostrils 2 (two) times daily as needed (allergies). 06/26/20   [provider]  dorzolamide-timolol (COSOPT) 22.3-6.8 MG/ML ophthalmic solution Place 1 drop into both eyes 2 (two) times daily.  09/18/19   [provider]  hydrochlorothiazide (HYDRODIURIL) 25 MG tablet Take 37.5 mg by mouth daily.    [provider]  latanoprost (XALATAN) 0.005 % ophthalmic solution Place 1 drop into both eyes at bedtime.     [provider]  losartan (COZAAR) 50 MG tablet TAKE ONE AND ONE-HALF TABLET BY MOUTH DAILY Patient taking differently: Take 50 mg by mouth daily. 12/02/21   [place], Peter M, MD  methocarbamol (ROBAXIN) 500 MG tablet Take 500 mg by mouth every 6 (six) hours as needed. 09/17/21   [provider]  Multiple Vitamin (MULTIVITAMIN WITH MINERALS) TABS tablet Take 1 tablet by mouth daily. Centrum silver    [provider]  Potassium 99 MG TABS Take 99 mg by mouth daily.    [provider]  RESTASIS 0.05 % ophthalmic emulsion Place 1 drop into both eyes daily as needed (Dey eyes). 07/07/19   [provider]  rivaroxaban (XARELTO) 20 MG TABS tablet Take 1 tablet (20 mg total) by mouth daily with supper. 01/29/22   [place], Peter M, MD  tamsulosin (FLOMAX) 0.4 MG CAPS capsule Take 0.4 mg by mouth daily. 06/15/19   [provider]    Physical Exam: Vitals:   07/17/22 1133 07/17/22 1447 07/17/22 1729  BP:  (!)  160/78   Pulse:  96   Resp:  18   Temp:  99.1 F (37.3 C) 99.7 F (37.6 C)  TempSrc:  Oral Oral  SpO2:  94%   Weight: 109.8 kg    Height: 5\' 9"  (1.753 m)      Constitutional: NAD, calm, comfortable Vitals:   07/17/22 1133 07/17/22 1447 07/17/22 1729  BP:  (!) 160/78   Pulse:  96   Resp:  18   Temp:  99.1 F (37.3 C) 99.7 F (37.6 C)  TempSrc:  Oral Oral  SpO2:  94%   Weight: 109.8 kg    Height: 5\' 9"  (1.753 m)     Eyes: PERRL, lids and conjunctivae normal ENMT: Mucous membranes are dry. Posterior pharynx clear of any exudate or lesions.Normal dentition.  Neck: normal, supple, no masses, no thyromegaly Respiratory: clear to auscultation bilaterally, no wheezing, no crackles. Normal respiratory effort. No accessory muscle use.  Cardiovascular: Regular rate and rhythm, no murmurs / rubs / gallops. No extremity edema. 2+ pedal pulses. No carotid bruits.  Abdomen: no tenderness, no masses palpated. No hepatosplenomegaly. Bowel sounds positive.  Musculoskeletal: no clubbing / cyanosis. No joint deformity upper and lower extremities. Good ROM, no contractures. Normal muscle tone.  5/5 bilateral upper extremity strength.  3/5 bilateral lower extremity strength. Skin: no rashes, lesions, ulcers. No induration Neurologic: CN 2-12 grossly intact. Sensation intact, unable to elicit reflexes symmetrically and diffusely.  5/5 bilateral upper extremity strength.  3/5 bilateral lower extremity strength.  Psychiatric: Normal judgment and insight. Alert and oriented x 3. Normal mood.   Labs on Admission: I have personally reviewed following labs and imaging studies  CBC: Recent Labs  Lab 07/17/22 1132  WBC 16.3*  NEUTROABS 14.9*  HGB 16.0  HCT 47.2  MCV 95.9  PLT [id number]    Basic Metabolic Panel: Recent Labs  Lab 07/17/22 1132  NA 135  K 3.2*  CL 96*  CO2 26  GLUCOSE 125*  BUN 17  CREATININE 1.07  CALCIUM 9.0    GFR: Estimated Creatinine Clearance: 68.3 mL/min (by C-G  formula based on SCr of 1.07 mg/dL).  Liver Function Tests: No results for input(s): "AST", "ALT", "ALKPHOS", "BILITOT", "PROT", "ALBUMIN" in the last 168 hours.  Urine analysis:    Component Value Date/Time   COLORURINE YELLOW 07/17/2022 [place] 07/17/2022 1539   LABSPEC 1.028 07/17/2022 1539   PHURINE 5.0 07/17/2022 1539  GLUCOSEU NEGATIVE 07/17/2022 1539   HGBUR MODERATE (A) 07/17/2022 1539   BILIRUBINUR NEGATIVE 07/17/2022 1539   KETONESUR NEGATIVE 07/17/2022 1539   PROTEINUR 100 (A) 07/17/2022 1539   UROBILINOGEN 0.2 02/04/2012 1108   NITRITE NEGATIVE 07/17/2022 1539   LEUKOCYTESUR NEGATIVE 07/17/2022 1539    Radiological Exams on Admission: MR Lumbar Spine W Wo Contrast  Result Date: 07/17/2022 CLINICAL DATA:  Bilateral leg weakness, difficulty walking EXAM: MRI LUMBAR SPINE WITHOUT AND WITH CONTRAST TECHNIQUE: Multiplanar and multiecho pulse sequences of the lumbar spine were obtained without and with intravenous contrast. CONTRAST:  73mL GADAVIST GADOBUTROL 1 MMOL/ML IV SOLN COMPARISON:  08/07/2021 MRI lumbar spine FINDINGS: Segmentation:  5 lumbar type vertebral bodies. Alignment: Trace retrolisthesis of T12 on L1 and L1 on L2. 4 mm retrolisthesis of L3 on L4 and L4 on L5. Mild dextrocurvature. Vertebrae: No acute fracture, suspicious osseous lesion, or evidence of discitis. No abnormal osseous enhancement. Conus medullaris and cauda equina: Conus extends to the L1 level. Conus and cauda equina appear normal. No abnormal enhancement. Paraspinal and other soft tissues: Infrarenal abdominal aorta measures up to 3.1 cm. No lymphadenopathy. Atrophy of the right greater than left inferior paraspinous muscles. Disc levels: T12-L1: Trace retrolisthesis and minimal disc bulge. Mild facet arthropathy. No spinal canal stenosis or neural foraminal narrowing. L1-L2: Trace retrolisthesis and minimal disc bulge. Mild facet arthropathy. No spinal canal stenosis or neural  foraminal narrowing. L2-L3: Mild disc bulge. Moderate facet arthropathy. Narrowing of the lateral recesses. Mild to moderate spinal canal stenosis and mild left neural foraminal narrowing, which have progressed from the prior exam. L3-L4: Mild retrolisthesis and mild disc bulge. Moderate facet arthropathy. No spinal canal stenosis. Severe left and mild-to-moderate right neural foraminal narrowing, which has progressed on the right. L4-L5: Mild retrolisthesis and mild disc bulge. Moderate facet arthropathy. Narrowing of the lateral recesses. Mild spinal canal stenosis and moderate bilateral neural foraminal narrowing, unchanged. L5-S1: Mild disc bulge with right foraminal and extreme lateral protrusion. Moderate to severe facet arthropathy. Narrowing of the lateral recesses. No spinal canal stenosis. Mild right neural foraminal narrowing, unchanged. IMPRESSION: 1. 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 2. L3-L4 severe left and mild-to-moderate right neural foraminal narrowing, which has progressed on the right. 3. L4-L5 mild spinal canal stenosis and moderate bilateral neural foraminal narrowing. 4. L5-S1 mild right neural foraminal narrowing. 5. Narrowing of the lateral recesses at at L2-L3, L3-L4, L4-L5, and L5-S1 could affect the descending L3, L4, L5, and S1 nerve roots, respectively. Electronically Signed   By: Merilyn Baba M.D.   On: 07/17/2022 14:53    EKG: Not done.  Assessment/Plan Principal Problem:   Pneumonia Active Problems:   HTN (hypertension)   Stroke [facility])   Sleep apnea   Right knee DJD   Hypercholesteremia   Malignant neoplasm of prostate ([facility])   Lower extremity weakness   Lower back pain   Paroxysmal A-fib ([place])   1.  Pneumonia -Patient noted to have presented with sudden onset lower extremity weakness. -Patient seen in the ED noted to have a leukocytosis and subsequently noted to have a  temp of 101.3. -Chest x-ray done for bibasilar multifocal pneumonia. -Blood cultures ordered and pending. -Check urine Legionella antigen, urine pneumococcus antigen. -Placed empirically on IV Rocephin, IV azithromycin. -Supportive care.  2.  Bilateral lower extremity weakness/low back pain -Patient with sudden onset bilateral lower extremity weakness in the early hours of this morning when  he got up to go to the restroom leaving him on the ground for about 3 hours with inability to get up.  Patient denied any bowel or bladder incontinence however peed on the floor as he was unable to get up to go to the restroom.  Denies any numbness or tingling in his legs. -Check a total CK level. 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  L3-L4 severe left and mild to moderate right neuroforaminal narrowing which has progressed on the right.  L4-L5 mild spinal canal stenosis and moderate bilateral neural foraminal narrowing.  L5-S1 mild right neuroforaminal narrowing.  Narrowing of the lateral recesses at L2-L3, L3-L4, L4-L5, and L5-S1 could affect the descending L3, L4, L5 and S1 nerve roots respectively.  ED physician discussed with neurosurgery who reviewed films and did not feel any surgical intervention needed at this time and recommended pain management and PT OT. -Case discussed with neurosurgery, Dr. Christella Noa who graciously agreed to see patient, reviewed films and did not feel patient's symptoms were related to any pathology in the back.  It was noted that there was no abscess or infection noted on MRI per neurosurgery, spinal cord was normal and significantly doubt if patient's symptoms are related to MRI of the L-spine. -Hold statin. -Pain management.  PT/OT.  3.  Paroxysmal atrial fibrillation -Currently in sinus rhythm. -Currently rate controlled. -Resume home regimen  Xarelto for anticoagulation.  4.  BPH -Resume home regimen of Flomax.  5.  Hyperlipidemia -Hold statin in light of patient's lower extremity weakness.  6.  Hypertension -Resume home regimen Cozaar.  Hold HCTZ as patient clinically dry on examination.  7.  Dehydration -Hold HCTZ. -IV fluids.  8.  Sleep apnea -CPAP nightly.  9.  Malignant neoplasm of the prostate -Status radiation treatment. -Outpatient follow-up with urology.   DVT prophylaxis: Xarelto Code Status:   DNR Family Communication:  Updated patient, wife at bedside. Disposition Plan:   Patient is from:  Home  Anticipated DC to:  Home versus SNF  Anticipated DC date:  TBD  Anticipated DC barriers: \Clinical improvement  Consults called:  Neurosurgery: Dr. Christella Noa Admission status:  Place in observation in progressive care bed.  Severity of Illness: The appropriate patient status for this patient is OBSERVATION. Observation status is judged to be reasonable and necessary in order to provide the required intensity of service to ensure the patient's safety. The patient's presenting symptoms, physical exam findings, and initial radiographic and laboratory data in the context of their medical condition is felt to place them at decreased risk for further clinical deterioration. Furthermore, it is anticipated that the patient will be medically stable for discharge from the hospital within 2 midnights of admission.     Irine Seal MD [facility]  How to contact the [facility] Attending or Consulting provider [place] or covering provider during after hours [place], for this patient?   Check the care team in [facility] and look for a) attending/consulting [facility] provider listed and b) the [facility] team listed Log into [email or web address] and use [place]'s universal password to access. If you do not have the password, please contact the hospital operator. Locate the [facility] provider you are looking for under [facility] and page to a  number that you can be directly reached. If you still have difficulty reaching the provider, please page the [facility] (Director on Call)  for the Hospitalists listed on amion for assistance.  07/17/2022, 6:50 PM

## 2022-07-17 NOTE — ED Provider Notes (Signed)
[place] EMERGENCY DEPARTMENT AT [facility] Provider Note   CSN: [id number] Arrival date & time: 07/17/22  1050     History  No chief complaint on file.   Caleb Barton is a 82 y.o. male.  82 year old male presents with 1 week of worsening lower back pain.  Pain has been atraumatic.  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  States the pain has been worse with movement.  No loss of bowel or bladder function.  Has been using Robaxin without relief.  He denies any urinary symptoms.  No recent fever or chills.  States that it is like a stiffness.  Denies any foot drop when he is able to walk.  Called EMS and was transported here       Home Medications Prior to Admission medications   Medication Sig Start Date End Date Taking? Authorizing Provider  amoxicillin (AMOXIL) 250 MG capsule Take 1,000 mg by mouth See admin instructions. Take 1000 mg by mouth 1 hour prior to dental appointments    [provider]  Ascorbic Acid (VITAMIN C) 1000 MG tablet Take 1,000 mg by mouth daily.    [provider]  atorvastatin (LIPITOR) 40 MG tablet Take 40 mg by mouth daily.    [provider]  Azelastine HCl 137 MCG/SPRAY SOLN Place 1 puff into both nostrils 2 (two) times daily as needed (allergies). 06/26/20   [provider]  dorzolamide-timolol (COSOPT) 22.3-6.8 MG/ML ophthalmic solution Place 1 drop into both eyes 2 (two) times daily.  09/18/19   [provider]  hydrochlorothiazide (HYDRODIURIL) 25 MG tablet Take 37.5 mg by mouth daily.    [provider]  latanoprost (XALATAN) 0.005 % ophthalmic solution Place 1 drop into both eyes at bedtime.     [provider]  losartan (COZAAR) 50 MG tablet TAKE ONE AND ONE-HALF TABLET BY MOUTH DAILY Patient taking differently: Take 50 mg by mouth daily. 12/02/21   Martinique, Peter M, MD  methocarbamol (ROBAXIN) 500 MG tablet Take 500 mg by mouth every 6 (six) hours as needed. 09/17/21    [provider]  Multiple Vitamin (MULTIVITAMIN WITH MINERALS) TABS tablet Take 1 tablet by mouth daily. Centrum silver    [provider]  Potassium 99 MG TABS Take 99 mg by mouth daily.    [provider]  RESTASIS 0.05 % ophthalmic emulsion Place 1 drop into both eyes daily as needed (Dey eyes). 07/07/19   [provider]  rivaroxaban (XARELTO) 20 MG TABS tablet Take 1 tablet (20 mg total) by mouth daily with supper. 01/29/22   Martinique, Peter M, MD  tamsulosin (FLOMAX) 0.4 MG CAPS capsule Take 0.4 mg by mouth daily. 06/15/19   [provider]      Allergies    Lisinopril and Sulfa antibiotics    Review of Systems   Review of Systems  All other systems reviewed and are negative.   Physical Exam Updated Vital Signs There were no vitals taken for this visit. Physical Exam Vitals and nursing note reviewed.  Constitutional:      General: He is not in acute distress.    Appearance: Normal appearance. He is well-developed. He is not toxic-appearing.  HENT:     Head: Normocephalic and atraumatic.  Eyes:     General: Lids are normal.     Conjunctiva/sclera: Conjunctivae normal.     Pupils: Pupils are equal, round, and reactive to light.  Neck:     Thyroid:  No thyroid mass.     Trachea: No tracheal deviation.  Cardiovascular:     Rate and Rhythm: Normal rate and regular rhythm.     Heart sounds: Normal heart sounds. No murmur heard.    No gallop.  Pulmonary:     Effort: Pulmonary effort is normal. No respiratory distress.     Breath sounds: Normal breath sounds. No stridor. No decreased breath sounds, wheezing, rhonchi or rales.  Abdominal:     General: There is no distension.     Palpations: Abdomen is soft.     Tenderness: There is no abdominal tenderness. There is no rebound.  Musculoskeletal:        General: No tenderness. Normal range of motion.     Cervical back: Normal range of motion and neck supple.     Comments: None tender  to palpation along patient's lumbar spine  Skin:    General: Skin is warm and dry.     Findings: No abrasion or rash.  Neurological:     General: No focal deficit present.     Mental Status: He is alert and oriented to person, place, and time. Mental status is at baseline.     GCS: GCS eye subscore is 4. GCS verbal subscore is 5. GCS motor subscore is 6.     Cranial Nerves: No cranial nerve deficit.     Sensory: No sensory deficit.     Motor: Motor function is intact.     Comments: Strength is 5 of 5 bilateral lower extremities.  Normal plantar and dorsiflexion bilateral  Psychiatric:        Attention and Perception: Attention normal.        Speech: Speech normal.        Behavior: Behavior normal.     ED Results / Procedures / Treatments   Labs (all labs ordered are listed, but only abnormal results are displayed) Labs Reviewed - No data to display  EKG None  Radiology No results found.  Procedures Procedures    Medications Ordered in ED Medications  lactated ringers infusion (has no administration in time range)  morphine (PF) 4 MG/ML injection 6 mg (has no administration in time range)  methocarbamol (ROBAXIN) 500 mg in dextrose 5 % 50 mL IVPB (has no administration in time range)    ED Course/ Medical Decision Making/ A&P                             Medical Decision Making Amount and/or Complexity of Data Reviewed Labs: ordered. Radiology: ordered.  Risk OTC drugs. Prescription drug management.   Patient presented with back pain and new lower extremity weakness.  No symptoms concerning for cauda equina.  He had no urinary symptoms.  Has known history of lumbar disc disease.  Initially was afebrile.  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  Patient medicated for pain and still is uncomfortable.  Discussed with Dr. Christella Noa and Dr. Ronnald Ramp of neurosurgery.  Feel that there is no acute neurosurgical emergency  at this time but patient should be admitted for pain control.  I was then told patient had a low-grade temp of 101.  Blood cultures and lactate as well as COVID ordered.  Chest x-ray per my interpretation was read for possible early pneumonia.  Will start on IV antibiotics.  COVID test negative.  Plan will be for admission for IV antibiotics for pneumonia  as well as for pain management.  Family at bedside and informed of plan  CRITICAL CARE Performed by: Leota Jacobsen Total critical care time: 50 minutes Critical care time was exclusive of separately billable procedures and treating other patients. Critical care was necessary to treat or prevent imminent or life-threatening deterioration. Critical care was time spent personally by me on the following activities: development of treatment plan with patient and/or surrogate as well as nursing, discussions with consultants, evaluation of patient's response to treatment, examination of patient, obtaining history from patient or surrogate, ordering and performing treatments and interventions, ordering and review of laboratory studies, ordering and review of radiographic studies, pulse oximetry and re-evaluation of patient's condition.         Final Clinical Impression(s) / ED Diagnoses Final diagnoses:  None    Rx / DC Orders ED Discharge Orders     None         Lacretia Leigh, MD 07/17/22 1635

## 2022-07-17 NOTE — Consult Note (Signed)
Reason for Consult:weakness lower extremities Referring Physician: perrie, Caleb Barton is an 82 y.o. male.  HPI: who presents with acute weakness this am causing him to collapse to the floor and void. Had an RFA at L4,5,S1 on 06/18/22 bilaterally. He said he felt very good. He did well until this am. Came to the ED for further evaluation.   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    Sleep apnea    cpap;sleep study done at home;request report from dr.gates   Stroke [facility])    TIA 15 yrs ago    Past Surgical History:  Procedure Laterality Date   CARDIOVERSION N/A 10/14/2016   Procedure: CARDIOVERSION;  Surgeon: Larey Dresser, MD;  Location: [facility] ENDOSCOPY;  Service: Cardiovascular;  Laterality: N/A;   cataracts both eyes     ioc lens   COLONOSCOPY     with polyps   COLONOSCOPY  08/20/2011   Procedure: COLONOSCOPY;  Surgeon: Winfield Cunas., MD;  Location: [facility] ENDOSCOPY;  Service: Endoscopy;  Laterality: N/A;   COLONOSCOPY WITH PROPOFOL N/A 12/17/2016   Procedure: COLONOSCOPY WITH PROPOFOL;  Surgeon: Laurence Spates, MD;  Location: [facility] ENDOSCOPY;  Service: Endoscopy;  Laterality: N/A;   COLONOSCOPY WITH PROPOFOL N/A 07/08/2022   Procedure: COLONOSCOPY WITH PROPOFOL;  Surgeon: Otis Brace, MD;  Location: [facility] ENDOSCOPY;  Service: Gastroenterology;  Laterality: N/A;   EYE SURGERY     laser bilateral eyes   KNEE ARTHROSCOPY  x 2 right knee   LEFT HEART CATH AND CORONARY ANGIOGRAPHY N/A 07/29/2019   Procedure: LEFT HEART CATH AND CORONARY ANGIOGRAPHY;  Surgeon: Wellington Hampshire, MD;  Location: [place] CV LAB;  Service: Cardiovascular;  Laterality: N/A;   LUMBAR LAMINECTOMY/DECOMPRESSION MICRODISCECTOMY Right 09/09/2012    Procedure: LUMBAR LAMINECTOMY/DECOMPRESSION MICRODISCECTOMY 1 LEVEL;  Surgeon: Eustace Moore, MD;  Location: [place] NEURO ORS;  Service: Neurosurgery;  Laterality: Right;  LUMBAR LAMINECTOMY/DECOMPRESSION MICRODISCECTOMY 1 LEVEL   POLYPECTOMY  07/08/2022   Procedure: POLYPECTOMY;  Surgeon: Otis Brace, MD;  Location: [facility] ENDOSCOPY;  Service: Gastroenterology;;   TONSILLECTOMY  yrs ago   TOTAL KNEE ARTHROPLASTY  02/09/2012   Procedure: TOTAL KNEE ARTHROPLASTY;  Surgeon: Lorn Junes, MD;  Location: [place];  Service: Orthopedics;  Laterality: Right;   TOTAL KNEE ARTHROPLASTY Left 07/01/2021   Procedure: TOTAL KNEE ARTHROPLASTY;  Surgeon: Willaim Sheng, MD;  Location: [facility] ORS;  Service: Orthopedics;  Laterality: Left;    Family History  Problem Relation Age of Onset   Stroke Mother    Breast cancer Mother    Bladder Cancer Brother    Breast cancer Other    Prostate cancer Neg Hx    Pancreatic cancer Neg Hx     Social History:  reports that he has quit smoking. His smoking use included cigarettes. He has a 20.00 pack-year smoking history. He has never used smokeless tobacco. He reports current alcohol use. He reports that he does not use drugs.  Allergies:  Allergies  Allergen Reactions   Lisinopril Cough   Sulfa Antibiotics Rash    Medications: I have reviewed the patient's current medications.  Results for orders placed or performed  during the hospital encounter of 07/17/22 (from the past 48 hour(s))  CBC with Differential/Platelet     Status: Abnormal   Collection Time: 07/17/22 11:32 AM  Result Value Ref Range   WBC 16.3 (H) 4.0 - 10.5 K/uL   RBC 4.92 4.22 - 5.81 MIL/uL   Hemoglobin 16.0 13.0 - 17.0 g/dL   HCT 47.2 39.0 - 52.0 %   MCV 95.9 80.0 - 100.0 fL   MCH 32.5 26.0 - 34.0 pg   MCHC 33.9 30.0 - 36.0 g/dL   RDW 14.1 11.5 - 15.5 %   Platelets 159 150 - 400 K/uL   nRBC 0.0 0.0 - 0.2 %   Neutrophils Relative % 91 %   Neutro Abs 14.9 (H) 1.7 - 7.7 K/uL    Lymphocytes Relative 3 %   Lymphs Abs 0.5 (L) 0.7 - 4.0 K/uL   Monocytes Relative 5 %   Monocytes Absolute 0.8 0.1 - 1.0 K/uL   Eosinophils Relative 0 %   Eosinophils Absolute 0.0 0.0 - 0.5 K/uL   Basophils Relative 0 %   Basophils Absolute 0.1 0.0 - 0.1 K/uL   Immature Granulocytes 1 %   Abs Immature Granulocytes 0.14 (H) 0.00 - 0.07 K/uL    Comment: Performed at [facility], [place] [address] [place], [place] [id number]  Basic metabolic panel     Status: Abnormal   Collection Time: 07/17/22 11:32 AM  Result Value Ref Range   Sodium 135 135 - 145 mmol/L   Potassium 3.2 (L) 3.5 - 5.1 mmol/L   Chloride 96 (L) 98 - 111 mmol/L   CO2 26 22 - 32 mmol/L   Glucose, Bld 125 (H) 70 - 99 mg/dL    Comment: Glucose reference range applies only to samples taken after fasting for at least 8 hours.   BUN 17 8 - 23 mg/dL   Creatinine, Ser 1.07 0.61 - 1.24 mg/dL   Calcium 9.0 8.9 - 10.3 mg/dL   GFR, Estimated >60 >60 mL/min    Comment: (NOTE) Calculated using the CKD-EPI Creatinine Equation (2021)    Anion gap 13 5 - 15    Comment: Performed at [facility], [place] [address] [place], [place] [address]  SARS Coronavirus 2 by RT PCR (hospital order, performed in [facility] hospital lab) *cepheid single result test* Anterior Nasal Swab     Status: None   Collection Time: 07/17/22  3:13 PM   Specimen: Anterior Nasal Swab  Result Value Ref Range   SARS Coronavirus 2 by RT PCR NEGATIVE NEGATIVE    Comment: (NOTE) SARS-CoV-2 target nucleic acids are NOT DETECTED.  The SARS-CoV-2 RNA is generally detectable in upper and lower respiratory specimens during the acute phase of infection. The lowest concentration of SARS-CoV-2 viral copies this assay can detect is 250 copies / mL. A negative result does not preclude SARS-CoV-2 infection and should not be used as the sole basis for treatment or other patient management decisions.  A negative result may occur  with improper specimen collection / handling, submission of specimen other than nasopharyngeal swab, presence of viral mutation(s) within the areas targeted by this assay, and inadequate number of viral copies (<250 copies / mL). A negative result must be combined with clinical observations, patient history, and epidemiological information.  Fact Sheet for Patients:   https://www.patel.info/  Fact Sheet for Healthcare Providers: https://hall.com/  This test is not yet approved or  cleared by the [place] FDA and has been authorized for  detection and/or diagnosis of SARS-CoV-2 by FDA under an Emergency Use Authorization (EUA).  This EUA will remain in effect (meaning this test can be used) for the duration of the COVID-19 declaration under Section 564(b)(1) of the Act, 21 U.S.C. section 360bbb-3(b)(1), unless the authorization is terminated or revoked sooner.  Performed at [facility], [place] [address] [place], [place] [address]   Urinalysis, Routine w reflex microscopic -Urine, Clean Catch     Status: Abnormal   Collection Time: 07/17/22  3:39 PM  Result Value Ref Range   Color, Urine YELLOW YELLOW   APPearance CLEAR CLEAR   Specific Gravity, Urine 1.028 1.005 - 1.030   pH 5.0 5.0 - 8.0   Glucose, UA NEGATIVE NEGATIVE mg/dL   Hgb urine dipstick MODERATE (A) NEGATIVE   Bilirubin Urine NEGATIVE NEGATIVE   Ketones, ur NEGATIVE NEGATIVE mg/dL   Protein, ur 100 (A) NEGATIVE mg/dL   Nitrite NEGATIVE NEGATIVE   Leukocytes,Ua NEGATIVE NEGATIVE   RBC / HPF 0-5 0 - 5 RBC/hpf   WBC, UA 0-5 0 - 5 WBC/hpf   Bacteria, UA RARE (A) NONE SEEN   Squamous Epithelial / HPF 0-5 0 - 5 /HPF   Mucus PRESENT    Hyaline Casts, UA PRESENT    Granular Casts, UA PRESENT     Comment: Performed at [facility], [place] [address] [place], [place] [address]  Lactic acid, plasma     Status: Abnormal   Collection Time:  07/17/22  3:43 PM  Result Value Ref Range   Lactic Acid, Venous 2.3 (HH) 0.5 - 1.9 mmol/L    Comment: CRITICAL RESULT CALLED TO, READ BACK BY AND VERIFIED WITH S.KENT, RN AT 1709 ON 03.28.24 BY N.THOMPSON Performed at [facility], [place] [address], [place], [place] [address]   Lactic acid, plasma     Status: None   Collection Time: 07/17/22  5:30 PM  Result Value Ref Range   Lactic Acid, Venous 1.8 0.5 - 1.9 mmol/L    Comment: Performed at [facility], [place] [address] [place], [place] [address]    MR Lumbar Spine W Wo Contrast  Result Date: 07/17/2022 CLINICAL DATA:  Bilateral leg weakness, difficulty walking EXAM: MRI LUMBAR SPINE WITHOUT AND WITH CONTRAST TECHNIQUE: Multiplanar and multiecho pulse sequences of the lumbar spine were obtained without and with intravenous contrast. CONTRAST:  33mL GADAVIST GADOBUTROL 1 MMOL/ML IV SOLN COMPARISON:  08/07/2021 MRI lumbar spine FINDINGS: Segmentation:  5 lumbar type vertebral bodies. Alignment: Trace retrolisthesis of T12 on L1 and L1 on L2. 4 mm retrolisthesis of L3 on L4 and L4 on L5. Mild dextrocurvature. Vertebrae: No acute fracture, suspicious osseous lesion, or evidence of discitis. No abnormal osseous enhancement. Conus medullaris and cauda equina: Conus extends to the L1 level. Conus and cauda equina appear normal. No abnormal enhancement. Paraspinal and other soft tissues: Infrarenal abdominal aorta measures up to 3.1 cm. No lymphadenopathy. Atrophy of the right greater than left inferior paraspinous muscles. Disc levels: T12-L1: Trace retrolisthesis and minimal disc bulge. Mild facet arthropathy. No spinal canal stenosis or neural foraminal narrowing. L1-L2: Trace retrolisthesis and minimal disc bulge. Mild facet arthropathy. No spinal canal stenosis or neural foraminal narrowing. L2-L3: Mild disc bulge. Moderate facet arthropathy. Narrowing of the lateral recesses. Mild to moderate spinal canal stenosis  and mild left neural foraminal narrowing, which have progressed from the prior exam. L3-L4: Mild retrolisthesis and mild disc bulge. Moderate facet arthropathy. No spinal canal stenosis. Severe  left and mild-to-moderate right neural foraminal narrowing, which has progressed on the right. L4-L5: Mild retrolisthesis and mild disc bulge. Moderate facet arthropathy. Narrowing of the lateral recesses. Mild spinal canal stenosis and moderate bilateral neural foraminal narrowing, unchanged. L5-S1: Mild disc bulge with right foraminal and extreme lateral protrusion. Moderate to severe facet arthropathy. Narrowing of the lateral recesses. No spinal canal stenosis. Mild right neural foraminal narrowing, unchanged. IMPRESSION: 1. 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 2. L3-L4 severe left and mild-to-moderate right neural foraminal narrowing, which has progressed on the right. 3. L4-L5 mild spinal canal stenosis and moderate bilateral neural foraminal narrowing. 4. L5-S1 mild right neural foraminal narrowing. 5. Narrowing of the lateral recesses at at L2-L3, L3-L4, L4-L5, and L5-S1 could affect the descending L3, L4, L5, and S1 nerve roots, respectively. Electronically Signed   By: Merilyn Baba M.D.   On: 07/17/2022 14:53    Review of Systems  Constitutional: Negative.   HENT: Negative.    Eyes: Negative.   Respiratory: Negative.    Cardiovascular: Negative.   Gastrointestinal: Negative.   Endocrine: Negative.   Genitourinary: Negative.   Musculoskeletal:  Positive for back pain.  Skin: Negative.   Allergic/Immunologic: Negative.   Neurological:  Positive for weakness.  Hematological: Negative.   Psychiatric/Behavioral: Negative.     Blood pressure (!) 160/78, pulse 96, temperature 99.7 F (37.6 C), temperature source Oral, resp. rate 18, height 5\' 9"  (1.753 m), weight 109.8 kg, SpO2 94 %. Physical  Exam Constitutional:      Appearance: Normal appearance.  HENT:     Head: Normocephalic and atraumatic.     Nose: Nose normal.     Mouth/Throat:     Mouth: Mucous membranes are moist.     Pharynx: Oropharynx is clear.  Eyes:     Extraocular Movements: Extraocular movements intact.     Pupils: Pupils are equal, round, and reactive to light.  Cardiovascular:     Rate and Rhythm: Normal rate and regular rhythm.  Pulmonary:     Effort: Pulmonary effort is normal.  Abdominal:     General: Abdomen is flat.  Musculoskeletal:        General: Normal range of motion.     Cervical back: Normal range of motion and neck supple.  Skin:    General: Skin is warm and dry.  Neurological:     General: No focal deficit present.     Mental Status: He is lethargic.     Cranial Nerves: Cranial nerves 2-12 are intact.     Sensory: Sensation is intact.     Motor: Motor function is intact.     Coordination: Coordination is intact.     Deep Tendon Reflexes: Reflexes are normal and symmetric. Babinski sign absent on the right side. Babinski sign absent on the left side.     Comments: Just given morphine for mri scan. Drowsy, not at all sharp Following commands Perrl, full eom Tongue and uvula midline Gait not assessed     Assessment/Plan: SIDHARTH SHARUM is a 82 y.o. male With history of lumbar stenosis. Underwent MRI today due to severe back pain and inability to stand or walk. Neither his exam, nor his mri explains his presentation. He does not need operative intervention. Would try steroids.   Ashok Pall 07/17/2022, 6:57 PM

## 2022-07-18 ENCOUNTER — Inpatient Hospital Stay (HOSPITAL_COMMUNITY): Payer: Medicare HMO

## 2022-07-18 ENCOUNTER — Observation Stay (HOSPITAL_COMMUNITY): Payer: Medicare HMO

## 2022-07-18 DIAGNOSIS — A400 Sepsis due to streptococcus, group A: Secondary | ICD-10-CM | POA: Diagnosis not present

## 2022-07-18 DIAGNOSIS — R7881 Bacteremia: Secondary | ICD-10-CM

## 2022-07-18 DIAGNOSIS — Z471 Aftercare following joint replacement surgery: Secondary | ICD-10-CM | POA: Diagnosis not present

## 2022-07-18 DIAGNOSIS — I452 Bifascicular block: Secondary | ICD-10-CM | POA: Diagnosis not present

## 2022-07-18 DIAGNOSIS — Z7901 Long term (current) use of anticoagulants: Secondary | ICD-10-CM | POA: Diagnosis not present

## 2022-07-18 DIAGNOSIS — M6282 Rhabdomyolysis: Secondary | ICD-10-CM | POA: Diagnosis not present

## 2022-07-18 DIAGNOSIS — Z823 Family history of stroke: Secondary | ICD-10-CM | POA: Diagnosis not present

## 2022-07-18 DIAGNOSIS — Z66 Do not resuscitate: Secondary | ICD-10-CM | POA: Diagnosis not present

## 2022-07-18 DIAGNOSIS — Z8546 Personal history of malignant neoplasm of prostate: Secondary | ICD-10-CM | POA: Diagnosis not present

## 2022-07-18 DIAGNOSIS — E7800 Pure hypercholesterolemia, unspecified: Secondary | ICD-10-CM | POA: Diagnosis not present

## 2022-07-18 DIAGNOSIS — M25561 Pain in right knee: Secondary | ICD-10-CM | POA: Diagnosis not present

## 2022-07-18 DIAGNOSIS — Z1152 Encounter for screening for COVID-19: Secondary | ICD-10-CM | POA: Diagnosis not present

## 2022-07-18 DIAGNOSIS — E860 Dehydration: Secondary | ICD-10-CM | POA: Diagnosis not present

## 2022-07-18 DIAGNOSIS — J9811 Atelectasis: Secondary | ICD-10-CM | POA: Diagnosis not present

## 2022-07-18 DIAGNOSIS — Z803 Family history of malignant neoplasm of breast: Secondary | ICD-10-CM | POA: Diagnosis not present

## 2022-07-18 DIAGNOSIS — Z87891 Personal history of nicotine dependence: Secondary | ICD-10-CM | POA: Diagnosis not present

## 2022-07-18 DIAGNOSIS — T8450XD Infection and inflammatory reaction due to unspecified internal joint prosthesis, subsequent encounter: Secondary | ICD-10-CM | POA: Diagnosis not present

## 2022-07-18 DIAGNOSIS — N400 Enlarged prostate without lower urinary tract symptoms: Secondary | ICD-10-CM | POA: Diagnosis not present

## 2022-07-18 DIAGNOSIS — I480 Paroxysmal atrial fibrillation: Secondary | ICD-10-CM | POA: Diagnosis not present

## 2022-07-18 DIAGNOSIS — Y831 Surgical operation with implant of artificial internal device as the cause of abnormal reaction of the patient, or of later complication, without mention of misadventure at the time of the procedure: Secondary | ICD-10-CM | POA: Diagnosis not present

## 2022-07-18 DIAGNOSIS — Z923 Personal history of irradiation: Secondary | ICD-10-CM | POA: Diagnosis not present

## 2022-07-18 DIAGNOSIS — B955 Unspecified streptococcus as the cause of diseases classified elsewhere: Secondary | ICD-10-CM

## 2022-07-18 DIAGNOSIS — T8453XA Infection and inflammatory reaction due to internal right knee prosthesis, initial encounter: Secondary | ICD-10-CM | POA: Diagnosis not present

## 2022-07-18 DIAGNOSIS — G4730 Sleep apnea, unspecified: Secondary | ICD-10-CM | POA: Diagnosis not present

## 2022-07-18 DIAGNOSIS — Z96652 Presence of left artificial knee joint: Secondary | ICD-10-CM | POA: Diagnosis present

## 2022-07-18 DIAGNOSIS — Y92009 Unspecified place in unspecified non-institutional (private) residence as the place of occurrence of the external cause: Secondary | ICD-10-CM | POA: Diagnosis not present

## 2022-07-18 DIAGNOSIS — E872 Acidosis, unspecified: Secondary | ICD-10-CM | POA: Diagnosis not present

## 2022-07-18 DIAGNOSIS — M1711 Unilateral primary osteoarthritis, right knee: Secondary | ICD-10-CM | POA: Diagnosis not present

## 2022-07-18 DIAGNOSIS — I10 Essential (primary) hypertension: Secondary | ICD-10-CM | POA: Diagnosis not present

## 2022-07-18 DIAGNOSIS — Z8673 Personal history of transient ischemic attack (TIA), and cerebral infarction without residual deficits: Secondary | ICD-10-CM | POA: Diagnosis not present

## 2022-07-18 DIAGNOSIS — I2510 Atherosclerotic heart disease of native coronary artery without angina pectoris: Secondary | ICD-10-CM | POA: Diagnosis not present

## 2022-07-18 DIAGNOSIS — R7989 Other specified abnormal findings of blood chemistry: Secondary | ICD-10-CM | POA: Diagnosis not present

## 2022-07-18 DIAGNOSIS — Z96651 Presence of right artificial knee joint: Secondary | ICD-10-CM | POA: Diagnosis not present

## 2022-07-18 DIAGNOSIS — J189 Pneumonia, unspecified organism: Secondary | ICD-10-CM | POA: Diagnosis not present

## 2022-07-18 DIAGNOSIS — C61 Malignant neoplasm of prostate: Secondary | ICD-10-CM | POA: Diagnosis not present

## 2022-07-18 DIAGNOSIS — W19XXXA Unspecified fall, initial encounter: Secondary | ICD-10-CM | POA: Diagnosis not present

## 2022-07-18 DIAGNOSIS — M545 Low back pain, unspecified: Secondary | ICD-10-CM | POA: Diagnosis not present

## 2022-07-18 LAB — BLOOD CULTURE ID PANEL (REFLEXED) - BCID2

## 2022-07-18 LAB — SYNOVIAL CELL COUNT + DIFF, W/ CRYSTALS
Crystals, Fluid: NONE SEEN
Lymphocytes-Synovial Fld: 2 % (ref 0–20)
Monocyte-Macrophage-Synovial Fluid: 12 % — ABNORMAL LOW (ref 50–90)
Neutrophil, Synovial: 86 % — ABNORMAL HIGH (ref 0–25)
WBC, Synovial: 100 /mm3 (ref 0–200)

## 2022-07-18 LAB — COMPREHENSIVE METABOLIC PANEL
ALT: 98 U/L — ABNORMAL HIGH (ref 0–44)
AST: 157 U/L — ABNORMAL HIGH (ref 15–41)
Albumin: 3.3 g/dL — ABNORMAL LOW (ref 3.5–5.0)
Alkaline Phosphatase: 57 U/L (ref 38–126)
Anion gap: 10 (ref 5–15)
BUN: 19 mg/dL (ref 8–23)
CO2: 27 mmol/L (ref 22–32)
Calcium: 8.3 mg/dL — ABNORMAL LOW (ref 8.9–10.3)
Chloride: 97 mmol/L — ABNORMAL LOW (ref 98–111)
Creatinine, Ser: 1.13 mg/dL (ref 0.61–1.24)
GFR, Estimated: >60 mL/min (ref >60)
Glucose, Bld: 110 mg/dL — ABNORMAL HIGH (ref 70–99)
Potassium: 3.1 mmol/L — ABNORMAL LOW (ref 3.5–5.1)
Sodium: 134 mmol/L — ABNORMAL LOW (ref 135–145)
Total Bilirubin: 3.2 mg/dL — ABNORMAL HIGH (ref 0.3–1.2)
Total Protein: 6 g/dL — ABNORMAL LOW (ref 6.5–8.1)

## 2022-07-18 LAB — CBC WITH DIFFERENTIAL/PLATELET
Abs Immature Granulocytes: 0.08 10*3/uL — ABNORMAL HIGH (ref 0.00–0.07)
Basophils Absolute: 0.1 10*3/uL (ref 0.0–0.1)
Basophils Relative: 0 %
Eosinophils Absolute: 0 10*3/uL (ref 0.0–0.5)
Eosinophils Relative: 0 %
HCT: 42.8 % (ref 39.0–52.0)
Hemoglobin: 14.4 g/dL (ref 13.0–17.0)
Immature Granulocytes: 1 %
Lymphocytes Relative: 5 %
Lymphs Abs: 0.6 10*3/uL — ABNORMAL LOW (ref 0.7–4.0)
MCH: 33 pg (ref 26.0–34.0)
MCHC: 33.6 g/dL (ref 30.0–36.0)
MCV: 97.9 fL (ref 80.0–100.0)
Monocytes Absolute: 0.6 10*3/uL (ref 0.1–1.0)
Monocytes Relative: 5 %
Neutro Abs: 11.2 10*3/uL — ABNORMAL HIGH (ref 1.7–7.7)
Neutrophils Relative %: 89 %
Platelets: 124 10*3/uL — ABNORMAL LOW (ref 150–400)
RBC: 4.37 MIL/uL (ref 4.22–5.81)
RDW: 14.5 % (ref 11.5–15.5)
WBC: 12.6 10*3/uL — ABNORMAL HIGH (ref 4.0–10.5)
nRBC: 0 % (ref 0.0–0.2)

## 2022-07-18 LAB — MAGNESIUM: Magnesium: 2.1 mg/dL (ref 1.7–2.4)

## 2022-07-18 LAB — ECHOCARDIOGRAM COMPLETE
Area-P 1/2: 5.16 cm2
Calc EF: 45 %
Height: 69 in
MV VTI: 3.18 cm2
S' Lateral: 4.5 cm
Single Plane A2C EF: 48.1 %
Single Plane A4C EF: 39.7 %
Weight: 3872 oz

## 2022-07-18 LAB — PROTIME-INR
INR: 3.4 — ABNORMAL HIGH (ref 0.8–1.2)
INR: 2.2 — ABNORMAL HIGH (ref 0.8–1.2)
Prothrombin Time: 33.9 seconds — ABNORMAL HIGH (ref 11.4–15.2)
Prothrombin Time: 24.6 seconds — ABNORMAL HIGH (ref 11.4–15.2)

## 2022-07-18 LAB — CK: Total CK: 3438 U/L — ABNORMAL HIGH (ref 49–397)

## 2022-07-18 MED ORDER — ACETAMINOPHEN 80 MG RE SUPP: 500.0000 mg | Freq: Four times a day (QID) | RECTAL | Status: DC | PRN

## 2022-07-18 MED ORDER — POTASSIUM CHLORIDE CRYS ER 10 MEQ PO TBCR
40.0000 meq | EXTENDED_RELEASE_TABLET | ORAL | Status: AC
  Administered 2022-07-18 (×2): 40 meq via ORAL
  Filled 2022-07-18 (×2): qty 4

## 2022-07-18 MED ORDER — PENICILLIN G POTASSIUM 20000000 UNITS IJ SOLR
12.0000 10*6.[IU] | Freq: Two times a day (BID) | INTRAVENOUS | Status: DC
  Administered 2022-07-18 – 2022-07-24 (×13): 12 10*6.[IU] via INTRAVENOUS
  Filled 2022-07-18 (×13): qty 12

## 2022-07-18 MED ORDER — SODIUM CHLORIDE 0.9 % IV BOLUS
1000.0000 mL | Freq: Once | INTRAVENOUS | Status: AC
  Administered 2022-07-18: 1000 mL via INTRAVENOUS

## 2022-07-18 MED ORDER — ACETAMINOPHEN 500 MG PO TABS
500.0000 mg | ORAL_TABLET | Freq: Four times a day (QID) | ORAL | Status: DC | PRN
  Administered 2022-07-19 – 2022-07-24 (×4): 500 mg via ORAL
  Filled 2022-07-18 (×4): qty 1

## 2022-07-18 MED ORDER — LINEZOLID 600 MG/300ML IV SOLN
600.0000 mg | Freq: Two times a day (BID) | INTRAVENOUS | Status: DC
  Administered 2022-07-18 – 2022-07-19 (×3): 600 mg via INTRAVENOUS
  Filled 2022-07-18 (×3): qty 300

## 2022-07-18 NOTE — Evaluation (Signed)
Occupational Therapy Evaluation Patient Details Name: Caleb Barton MRN: [id number] DOB: 03/05/1941 Today's Date: 07/18/2022   History of Present Illness Caleb Barton is a 82 y.o. male presenting to the ED on 07/17/2022 with sudden onset lower extremity weakness and worsening low back pain.  He did fall to ground where he stayed for 3 hours until spouse could contact son who arrived and called EMS. CXR revealed: "Bibasilar patchy airspace opacities may reflect atelectasis versus  multifocal pneumonia in the appropriate clinical context."    Pt with medical history significant of paroxysmal atrial fibrillation/LAFB/RBBB, hypertension, hyperlipidemia, OSA on CPAP, remote history of thalamic CVA, BPH, history of prostate cancer status post radiation treatment approximately a year ago being followed by [facility], Dr. Jeffie Pollock, ablation of the nerves done in his lower back by neurosurgery, Dr.Eichman approximately 2 weeks prior to this adission.   Clinical Impression   Patient is currently requiring assistance with ADLs including up to total assist +2 with bed level Lower body ADLs, up to minimal assist with Upper body ADLs,  as well as  Max assist +2 with bed mobility and Max assist +2 with functional sit to stand transfers from highly elevated EOB and inability to pivot to toilet.  Current level of function is significantly below patient's typical baseline.    During this evaluation, patient was limited by generalized weakness, impaired activity tolerance, and acute RT knee pain on top of chronic lower back pain, all of which has the potential to impact patient's safety and independence during functional mobility, as well as performance for ADLs.  Patient lives in a 2 story home and must access top floor for bed and full bath. Pt's wife who is able to provide 24/7 supervision and assistance would not be able to care for the pt with his current abilities.   Patient demonstrates good rehab potential, and  should benefit from continued skilled occupational therapy services while in acute care to maximize safety, independence and quality of life at home.  Continued occupational therapy service is recommended.  ?    Recommendations for follow up therapy are one component of a multi-disciplinary discharge planning process, led by the attending physician.  Recommendations may be updated based on patient status, additional functional criteria and insurance authorization.   Assistance Recommended at Discharge Frequent or constant Supervision/Assistance  Patient can return home with the following Two people to help with walking and/or transfers;Assistance with cooking/housework;Assist for transportation;Two people to help with bathing/dressing/bathroom;A lot of help with walking and/or transfers;A lot of help with bathing/dressing/bathroom;Help with stairs or ramp for entrance    Functional Status Assessment  Patient has had a recent decline in their functional status and demonstrates the ability to make significant improvements in function in a reasonable and predictable amount of time.  Equipment Recommendations  BSC/3in1 (and defer to Post acute recommendations)    Recommendations for Other Services       Precautions / Restrictions Precautions Precautions: Fall Precaution Comments: Acute RT knee pain. Restrictions Weight Bearing Restrictions: No      Mobility Bed Mobility Overal bed mobility: Needs Assistance Bed Mobility: Rolling, Sidelying to Sit, Sit to Sidelying Rolling: Max assist Sidelying to sit: Max assist, HOB elevated     Sit to sidelying: Max assist, +2 for physical assistance General bed mobility comments: Pt cued on log roll technique due to LBP. Pt required Max As of 2 for safe mobility.    Transfers  Balance Overall balance assessment: Needs assistance Sitting-balance support: Feet supported, Single extremity supported Sitting  balance-Leahy Scale: Fair     Standing balance support: Bilateral upper extremity supported, During functional activity, Reliant on assistive device for balance Standing balance-Leahy Scale: Poor                             ADL either performed or assessed with clinical judgement   ADL Overall ADL's : Needs assistance/impaired Eating/Feeding: Independent;Bed level   Grooming: Set up;Bed level   Upper Body Bathing: Set up;Bed level   Lower Body Bathing: Maximal assistance;+2 for physical assistance;Bed level   Upper Body Dressing : Sitting;Minimal assistance   Lower Body Dressing: Total assistance;Bed level   Toilet Transfer: Maximal assistance;Rolling walker (2 wheels);+2 for physical assistance Toilet Transfer Details (indicate cue type and reason): Pt stood from highly elevated EOB to RW x 2 reps to allow for RN skin inspection and bed linen change. Max As of 2 people with poor tolerance to standing and inabiilty to take steps. Pt cued to WB on LLE only due to acute RT knee pain from unknown source. Toileting- Clothing Manipulation and Hygiene: Total assistance;Bed level Toileting - Clothing Manipulation Details (indicate cue type and reason): On external cath.     Functional mobility during ADLs: Maximal assistance;Rolling walker (2 wheels);+2 for physical assistance       Vision   Vision Assessment?: No apparent visual deficits     Perception     Praxis      Pertinent Vitals/Pain Pain Assessment Pain Assessment: Faces Pain Score: 5  Faces Pain Scale: Hurts worst Pain Location: Lower back 5/10 at rest.  RT knee pain Wong/Baker of 10/10 pain with mobility and weight bearing. Pain Intervention(s): Limited activity within patient's tolerance, Monitored during session, Repositioned (RN in room)     Hand Dominance Right   Extremity/Trunk Assessment Upper Extremity Assessment Upper Extremity Assessment: Generalized weakness   Lower Extremity  Assessment Lower Extremity Assessment: Defer to PT evaluation;RLE deficits/detail RLE: Unable to fully assess due to pain       Communication Communication Communication: No difficulties   Cognition Arousal/Alertness: Awake/alert Behavior During Therapy: WFL for tasks assessed/performed Overall Cognitive Status: Within Functional Limits for tasks assessed                                 General Comments: Ox4     General Comments       Exercises     Shoulder Instructions      Home Living Family/patient expects to be discharged to:: Private residence Living Arrangements: Spouse/significant other Available Help at Discharge: Family Type of Home: House Home Access: Stairs to enter [employer] of Steps: 5 to side door with LT rail. Front with 2 steps and RT and LT rails wide apart. Entrance Stairs-Rails: Left Home Layout: Two level;1/2 bath on main level Alternate Level Stairs-Number of Steps: 13 Alternate Level Stairs-Rails: Right;Left Bathroom Shower/Tub: Occupational psychologist: Handicapped height     Home Equipment: Tub bench;Cane - single Barista (2 wheels)   Additional Comments: Adjustable bed without rails.      Prior Functioning/Environment Prior Level of Function : Independent/Modified Independent;Driving;History of Falls (last six months)             Mobility Comments: Typically ambulates without any AD. ADLs Comments: Ind  OT Problem List: Decreased strength;Decreased range of motion;Decreased activity tolerance;Decreased knowledge of use of DME or AE;Impaired balance (sitting and/or standing);Decreased knowledge of precautions;Pain;Cardiopulmonary status limiting activity      OT Treatment/Interventions: Self-care/ADL training;Therapeutic activities;Therapeutic exercise;Energy conservation;Patient/family education;Balance training;DME and/or AE instruction    OT Goals(Current goals can be found  in the care plan section) Acute Rehab OT Goals Patient Stated Goal: Regain all abilities. OT Goal Formulation: With patient/family Time For Goal Achievement: 08/01/22 Potential to Achieve Goals: Good ADL Goals Pt Will Perform Grooming: sitting;with set-up (EOB with good dynamic balance without UE support) Pt Will Perform Lower Body Dressing: with min assist;sit to/from stand;sitting/lateral leans;with adaptive equipment Pt Will Transfer to Toilet: with min assist;stand pivot transfer;bedside commode Pt Will Perform Toileting - Clothing Manipulation and hygiene: with min assist;sitting/lateral leans;sit to/from stand;with adaptive equipment Pt/caregiver will Perform Home Exercise Program: Increased strength;Both right and left upper extremity;Increased ROM;With Supervision  OT Frequency: Min 2X/week    Co-evaluation              AM-PAC OT "6 Clicks" Daily Activity     Outcome Measure Help from another person eating meals?: None Help from another person taking care of personal grooming?: A Little Help from another person toileting, which includes using toliet, bedpan, or urinal?: Total Help from another person bathing (including washing, rinsing, drying)?: A Lot Help from another person to put on and taking off regular upper body clothing?: A Little Help from another person to put on and taking off regular lower body clothing?: Total 6 Click Score: 14   End of Session Equipment Utilized During Treatment: Gait belt;Rolling walker (2 wheels);Oxygen Nurse Communication: Other (comment) (RN, Mary in room for majority of session. MD notified of RT knee pain preventing mobility.)  Activity Tolerance: Patient limited by pain Patient left: in bed;with call bell/phone within reach;with bed alarm set;with nursing/sitter in room;with family/visitor present  OT Visit Diagnosis: Unsteadiness on feet (R26.81);Pain;Muscle weakness (generalized) (M62.81) Pain - Right/Left: Right Pain - part of  body: Knee (and lower back)                Time: [id number] OT Time Calculation (min): 51 min Charges:  OT General Charges $OT Visit: 1 Visit OT Evaluation $OT Eval Moderate Complexity: 1 Mod OT Treatments $Self Care/Home Management : 8-22 mins $Therapeutic Activity: 8-22 mins  Anderson Malta, [place] Office: [phone] 07/18/2022  Julien Girt 07/18/2022, 10:09 AM

## 2022-07-18 NOTE — Consult Note (Signed)
ORTHOPAEDIC CONSULTATION  REQUESTING PHYSICIAN: Eugenie Filler, MD  Chief Complaint: Right knee pain  HPI: Caleb Barton is a 82 y.o. male who complains of right knee pain and swelling that has been present since his operation last year or at least that is what he told me.  Upon review of the records, it looks like he had his right knee replaced back in 2013, the left one was done last year.  The right one is the 1 that he currently complains about.[name]  He had a right knee replacement.  Things have gotten worse, he has significant malaise, difficulty with ambulation, he was admitted for sepsis.  He denies fevers, but says he has had sweats, as well as chills.  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   Dyspnea    Dysrhythmia    RBBB   Hypercholesteremia    takes Lipitor daily and niacin daily   Hypertension    takes HCTZ daily and losartan daily   Prostate cancer ([place])    Right knee DJD    Sleep apnea    cpap;sleep study done at home;request report from dr.gates   Stroke [facility])    TIA 15 yrs ago   Past Surgical History:  Procedure Laterality Date   CARDIOVERSION N/A 10/14/2016   Procedure: CARDIOVERSION;  Surgeon: Larey Dresser, MD;  Location: [facility] ENDOSCOPY;  Service: Cardiovascular;  Laterality: N/A;   cataracts both eyes     ioc lens   COLONOSCOPY     with polyps   COLONOSCOPY  08/20/2011   Procedure: COLONOSCOPY;  Surgeon: Winfield Cunas., MD;  Location: [facility] ENDOSCOPY;  Service: Endoscopy;  Laterality: N/A;   COLONOSCOPY WITH PROPOFOL N/A 12/17/2016   Procedure: COLONOSCOPY WITH PROPOFOL;  Surgeon: Laurence Spates, MD;  Location: [facility] ENDOSCOPY;  Service: Endoscopy;  Laterality: N/A;   COLONOSCOPY WITH PROPOFOL N/A 07/08/2022   Procedure: COLONOSCOPY WITH PROPOFOL;  Surgeon: Otis Brace, MD;  Location: [facility] ENDOSCOPY;  Service: Gastroenterology;  Laterality: N/A;   EYE SURGERY     laser bilateral eyes   KNEE  ARTHROSCOPY  x 2 right knee   LEFT HEART CATH AND CORONARY ANGIOGRAPHY N/A 07/29/2019   Procedure: LEFT HEART CATH AND CORONARY ANGIOGRAPHY;  Surgeon: Wellington Hampshire, MD;  Location: [facility] LAB;  Service: Cardiovascular;  Laterality: N/A;   LUMBAR LAMINECTOMY/DECOMPRESSION MICRODISCECTOMY Right 09/09/2012   Procedure: LUMBAR LAMINECTOMY/DECOMPRESSION MICRODISCECTOMY 1 LEVEL;  Surgeon: Eustace Moore, MD;  Location: [place] NEURO ORS;  Service: Neurosurgery;  Laterality: Right;  LUMBAR LAMINECTOMY/DECOMPRESSION MICRODISCECTOMY 1 LEVEL   POLYPECTOMY  07/08/2022   Procedure: POLYPECTOMY;  Surgeon: Otis Brace, MD;  Location: [facility] ENDOSCOPY;  Service: Gastroenterology;;   TONSILLECTOMY  yrs ago   TOTAL KNEE ARTHROPLASTY  02/09/2012   Procedure: TOTAL KNEE ARTHROPLASTY;  Surgeon: Lorn Junes, MD;  Location: [place];  Service: Orthopedics;  Laterality: Right;   TOTAL KNEE ARTHROPLASTY Left 07/01/2021   Procedure: TOTAL KNEE ARTHROPLASTY;  Surgeon: Willaim Sheng, MD;  Location: [facility] ORS;  Service: Orthopedics;  Laterality: Left;   Social History   Socioeconomic History   Marital status: Married    Spouse name: [name]   Number of children: 2   Years of education: Not on file   Highest education level: Not on file  Occupational History    Comment: retired  Tobacco Use   Smoking status: Former    Packs/day: 1.00  Years: 20.00    Additional pack years: 0.00    Total pack years: 20.00    Types: Cigarettes   Smokeless tobacco: Never   Tobacco comments:    quit 35 yrs ago  Vaping Use   Vaping Use: Never used  Substance and Sexual Activity   Alcohol use: Yes    Comment: social   Drug use: No   Sexual activity: Yes  Other Topics Concern   Not on file  Social History Narrative   Not on file   Social Determinants of Health   Financial Resource Strain: Not on file  Food Insecurity: No Food Insecurity (07/18/2022)   Hunger Vital Sign    Worried About Running Out of Food in  the Last Year: Never true    Ran Out of Food in the Last Year: Never true  Transportation Needs: No Transportation Needs (07/18/2022)   PRAPARE - Hydrologist (Medical): No    Lack of Transportation (Non-Medical): No  Physical Activity: Not on file  Stress: Not on file  Social Connections: Not on file   Family History  Problem Relation Age of Onset   Stroke Mother    Breast cancer Mother    Bladder Cancer Brother    Breast cancer Other    Prostate cancer Neg Hx    Pancreatic cancer Neg Hx    Allergies  Allergen Reactions   Lisinopril Cough   Sulfa Antibiotics Rash     Positive ROS: All other systems have been reviewed and were otherwise negative with the exception of those mentioned in the HPI and as above.  Physical Exam: General: Alert, no acute distress Cardiovascular: Mild pedal edema Respiratory: No cyanosis, no use of accessory musculature GI: No organomegaly, abdomen is soft and non-tender Skin: No lesions in the area of chief complaint, his surgical wounds of healed well Neurologic: Sensation intact distally Psychiatric: Patient is competent for consent with normal mood and affect Lymphatic: No axillary or cervical lymphadenopathy  MUSCULOSKELETAL: Right knee has a moderate to large knee effusion, with a painful arc of motion, he is really unable to do a straight leg raise and does not like to move the knee much at all.  Assessment: Principal Problem:   Pneumonia Active Problems:   HTN (hypertension)   Stroke [facility])   Sleep apnea   Right knee DJD   Hypercholesteremia   Malignant neoplasm of prostate (HCC)   Lower extremity weakness   Lower back pain   Paroxysmal A-fib (HCC)   Streptococcal bacteremia   Sepsis due to group A Streptococcus without acute organ dysfunction (HCC)   Possible right septic knee  Plan: Right knee aspiration, we will send to the lab for urgent cell count, analysis, Gram stain culture and  sensitivity.  N.p.o. after midnight, may require surgical debridement.  We will post him for tomorrow tentatively with Dr. Quentin Cornwall, and I have discussed this with the patient.  Surgical decision making will depend on the findings from the aspiration.  07/18/2022  2:28 PM  PATIENT:  Caleb Barton    PRE-PROCEDURE DIAGNOSIS: Right knee effusion, possible sepsis  POST-OPERATIVE DIAGNOSIS:  Same  PROCEDURE: Right knee aspiration of the synovial fluid from the joint  PROCEDURE DETAILS:  After informed verbal consent was obtained the superolateral portal was prepped with alcohol and the knee was aspirated, yielding a total of 20 ml of turbid joint fluid.  This was obtained quite easily, and there was likely much more.  A Band-Aid was placed.  I also applied 4 x 4's.  He tolerated the procedure well and no complications.  This was sent to the laboratory for Gram stain culture and sensitivity and analysis with cell count.     Johnny Bridge, MD Cell [phone]   07/18/2022 2:24 PM

## 2022-07-18 NOTE — Progress Notes (Addendum)
Knee aspiration demonstrated only 100 white blood cells, and although it was turbid, so far it does not appear infected.  Additionally, his INR is elevated, now down from above 3 to 2.2, however I do not think he needs surgery for the knee at this time.  We will await surgical cultures, and more definitive clinical information.  This certainly is unusual, his knee is very painful, but this may not represent infection... ?  Will continue to follow.  Okay to eat after midnight tonight.  I have spoken with the family and given them updates.  Marchia Bond, MD  ADDENDUM:  discussed with Dr. Zachery Dakins, will keep npo after 4:30a just in case his culture turns positive.  No definitive plans yet.

## 2022-07-18 NOTE — Consult Note (Signed)
[facility]    Date of Admission:  07/17/2022     Reason for Consult: Group A strep bacteremia     Referring Physician: Dr. Grandville Silos  Current antibiotics: Ceftriaxone Azithromycin   ASSESSMENT:    82 y.o. male admitted with:  Sepsis and Streptococcus pyogenes bacteremia: Source of bacteremia not entirely clear.  He does have some superficial skin abrasions which may have served as a portal of infection.  He recently underwent lumbar radiofrequency ablation, however, the site looks okay and MRI obtained on admission did not show any obvious evidence of discitis/osteomyelitis or abscess.  His RFA procedure was over 4 weeks ago at this point so would suspect radiographic changes to be present if there was infection in this area by now.  His chest x-ray raised the question of bibasilar pneumonia versus atelectasis.  Likely favor the latter given his lack of pulmonary symptoms prior to admission.  Reportedly, several family members have had strep throat recently of whom he has come in contact with as well. Right prosthetic knee pain: In the setting of bacteremia, raises some concern for seeding of this joint.  Alternatively, he could have injured this knee during his fall. Rhabdomyolysis: CK minimally elevated in the setting of fall at home and prolonged time on the floor. Elevated LFTs: AST and ALT mildly elevated with bilirubin 3.2.  Suspect secondary to #3 as well as #1 plan apart. Recent colonoscopy: Patient had colonoscopy as an outpatient on 07/08/2022.  Several polyps were removed but was otherwise uneventful. The PIV site from this procedure is unremarkable.   RECOMMENDATIONS:    Switch antibiotics to penicillin and linezolid for antitoxin effect Will discontinue azithromycin at this time given the uncertainty of the pneumonia diagnosis and positive blood cultures Repeat blood cultures tomorrow morning Echocardiogram has been ordered Discussed with  primary team who will order imaging of his knee starting with plain films.  Would then consider CT or MRI to further evaluate if x-rays are unremarkable.  Would also consider orthopedic evaluation as needed If back pain persists or is worsening, would consider repeat imaging to see if at any infection has revealed itself If worsening respiratory status and concern for pneumonia persist, would consider CT chest without contrast Dr. Baxter Flattery is here over the weekend.    Principal Problem:   Pneumonia Active Problems:   HTN (hypertension)   Stroke [facility])   Sleep apnea   Right knee DJD   Hypercholesteremia   Malignant neoplasm of prostate (HCC)   Lower extremity weakness   Lower back pain   Paroxysmal A-fib (HCC)   Streptococcal bacteremia   MEDICATIONS:    Scheduled Meds:  vitamin C  1,000 mg Oral Daily   dorzolamide-timolol  1 drop Both Eyes BID   latanoprost  1 drop Both Eyes QHS   losartan  50 mg Oral QHS   multivitamin with minerals  1 tablet Oral Daily   potassium chloride  40 mEq Oral Q4H   rivaroxaban  20 mg Oral Q supper   sodium chloride flush  3 mL Intravenous Q12H   tamsulosin  0.4 mg Oral Daily   Continuous Infusions:  azithromycin     cefTRIAXone (ROCEPHIN)  IV     lactated ringers Stopped (07/17/22 1540)   sodium chloride     PRN Meds:.acetaminophen **OR** acetaminophen, albuterol, cycloSPORINE, hydrALAZINE, methocarbamol, morphine injection, ondansetron **OR** ondansetron (ZOFRAN) IV, oxyCODONE, polyethylene glycol, sorbitol  HPI:    Caleb Barton is a 82  y.o. male with past medical history of atrial fibrillation, hypertension, hyperlipidemia, remote CVA, BPH, history of prostate cancer status post radiation, history of bilateral knee replacements, prior lumbar laminectomy/decompression (2014) who presented to the emergency department yesterday with acute onset lower extremity weakness and low back pain.  Patient reports having a bilateral nerve ablation done  in his lower back by neurosurgery on 06/18/2022.  States that his lower back felt great following this procedure and he was able to walk without the aid of his walker.  However, the evening prior to admission he noted noted some chills and fatigue, but was afebrile.  He had a brief episode of confusion when trying to open the door for his wife that evening as well.  On the morning of admission, he got up to use the bathroom and his legs felt weak and he fell to the ground.  He was on the ground for approximately 3 hours until she was able to call her son who came to the home.  EMS was called and brought him to the emergency department.  Prior to his presentation in the ER, he really did not have any specific symptoms.  Other than the chills the night prior, he had no fever.  He had no respiratory symptoms.  He reports chronic dyspnea as well as intermittent cough that was relatively unchanged.  In the emergency department he was found to have a lactic acidosis of 2.3 with a leukocytosis of 16.  MRI of the lumbar spine was undertaken given his recent procedure.  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  This was discussed with neurosurgery who recommended admission for pain control.  Patient then developed a fever and blood cultures were obtained.  Dr. Christella Noa from neurosurgery saw the patient yesterday.  Recommended steroids.  Given his fever, a chest x-ray was also obtained which was notable for patchy airspace opacities reflective of atelectasis versus pneumonia.  Patient's blood cultures that were obtained in the ER are now positive for gram-positive cocci in chains in 4 out of 4 bottles.  BC ID has noted Streptococcus pyogenes.  He is currently on ceftriaxone and azithromycin given the concern for pneumonia.  This will be changed to penicillin and linezolid.  While working with physical therapy earlier this morning, they noted significant  right knee pain which was preventing him from bearing any weight on this knee.  Patient reports that he thinks it has been bothering him for few days.  Patient's wife also reports that several family members have recently been sick with strep throat.  They report having spent time with these family members as well.   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   Dyspnea    Dysrhythmia    RBBB   Hypercholesteremia    takes Lipitor daily and niacin daily   Hypertension    takes HCTZ daily and losartan daily   Prostate cancer (HCC)    Right knee DJD    Sleep apnea    cpap;sleep study done at home;request report from dr.gates   Stroke [facility])    TIA 15 yrs ago    Social History   Tobacco Use   Smoking status: Former    Packs/day: 1.00    Years: 20.00    Additional pack years: 0.00    Total pack years: 20.00    Types: Cigarettes   Smokeless tobacco:  Never   Tobacco comments:    quit 35 yrs ago  Vaping Use   Vaping Use: Never used  Substance Use Topics   Alcohol use: Yes    Comment: social   Drug use: No    Family History  Problem Relation Age of Onset   Stroke Mother    Breast cancer Mother    Bladder Cancer Brother    Breast cancer Other    Prostate cancer Neg Hx    Pancreatic cancer Neg Hx     Allergies  Allergen Reactions   Lisinopril Cough   Sulfa Antibiotics Rash    Review of Systems  All other systems reviewed and are negative. Except as otherwise noted above in the HPI  OBJECTIVE:   Blood pressure 122/72, pulse 95, temperature 98.7 F (37.1 C), temperature source Oral, resp. rate (!) 37, height 5\' 9"  (1.753 m), weight 109.8 kg, SpO2 96 %. Body mass index is 35.74 kg/m.  Physical Exam Constitutional:      General: He is not in acute distress.    Appearance: Normal appearance.  HENT:     Head: Normocephalic and atraumatic.  Eyes:     Extraocular Movements: Extraocular movements intact.      Conjunctiva/sclera: Conjunctivae normal.  Cardiovascular:     Rate and Rhythm: Normal rate and regular rhythm.  Pulmonary:     Effort: Pulmonary effort is normal. No respiratory distress.     Comments: He is breathing fairly comfortably on 1 L of nasal cannula Abdominal:     General: There is no distension.     Palpations: Abdomen is soft.     Tenderness: There is no abdominal tenderness. There is no guarding or rebound.  Musculoskeletal:     Cervical back: Normal range of motion and neck supple.     Right lower leg: No edema.     Left lower leg: No edema.     Comments: His lumbar spine shows no evidence of tenderness or erythema.  Prior surgical incision is well-healed. His left upper scapula has a mildly irritated appearing mole with mild erythema but no significant cellulitis. Left knee nontender with preserved range of motion. Right knee feels slightly warm with decreased range of motion secondary to pain.  Skin:    General: Skin is warm and dry.     Findings: No rash.     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  Neurological:     General: No focal deficit present.     Mental Status: He is alert and oriented to person, place, and time.  Psychiatric:        Mood and Affect: Mood normal.        Behavior: Behavior normal.      Lab Results: Lab Results  Component Value Date   WBC 12.6 (H) 07/18/2022   HGB 14.4 07/18/2022   HCT 42.8 07/18/2022   MCV 97.9 07/18/2022   PLT 124 (L) 07/18/2022    Lab Results  Component Value Date   NA 134 (L) 07/18/2022   K 3.1 (L) 07/18/2022   CO2 27 07/18/2022   GLUCOSE 110 (H) 07/18/2022   BUN 19 07/18/2022   CREATININE 1.13 07/18/2022   CALCIUM 8.3 (L) 07/18/2022   GFRNONAA >60 07/18/2022   GFRAA 98 07/27/2019    Lab Results  Component Value Date   ALT 98 (H) 07/18/2022   AST 157 (H) 07/18/2022   ALKPHOS 57 07/18/2022   BILITOT 3.2 (  H) 07/18/2022    No results  found for: "CRP"  No results found for: "ESRSEDRATE"  I have reviewed the micro and lab results in Epic.  Imaging: MR Lumbar Spine W Wo Contrast  Addendum Date: 07/17/2022   ADDENDUM REPORT: 07/17/2022 17:13 IMPRESSION: 6. 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 Recommend follow-up ultrasound every 3 years. This recommendation follows ACR consensus guidelines: White Paper of the ACR Incidental Findings Committee II on Vascular Findings. J Am Coll Radiol 2013; 10:789-794. Electronically Signed   By: Merilyn Baba M.D.   On: 07/17/2022 17:13   Result Date: 07/17/2022 CLINICAL DATA:  Bilateral leg weakness, difficulty walking EXAM: MRI LUMBAR SPINE WITHOUT AND WITH CONTRAST TECHNIQUE: Multiplanar and multiecho pulse sequences of the lumbar spine were obtained without and with intravenous contrast. CONTRAST:  70mL GADAVIST GADOBUTROL 1 MMOL/ML IV SOLN COMPARISON:  08/07/2021 MRI lumbar spine FINDINGS: Segmentation:  5 lumbar type vertebral bodies. Alignment: Trace retrolisthesis of T12 on L1 and L1 on L2. 4 mm retrolisthesis of L3 on L4 and L4 on L5. Mild dextrocurvature. Vertebrae: No acute fracture, suspicious osseous lesion, or evidence of discitis. No abnormal osseous enhancement. Conus medullaris and cauda equina: Conus extends to the L1 level. Conus and cauda equina appear normal. No abnormal enhancement. Paraspinal and other soft tissues: Infrarenal abdominal aorta measures up to 3.1 cm. No lymphadenopathy. Atrophy of the right greater than left inferior paraspinous muscles. Disc levels: T12-L1: Trace retrolisthesis and minimal disc bulge. Mild facet arthropathy. No spinal canal stenosis or neural foraminal narrowing. L1-L2: Trace retrolisthesis and minimal disc bulge. Mild facet arthropathy. No spinal canal stenosis or neural foraminal narrowing. L2-L3: Mild disc bulge. Moderate facet arthropathy. Narrowing of the lateral recesses. Mild to moderate spinal canal stenosis and mild left neural  foraminal narrowing, which have progressed from the prior exam. L3-L4: Mild retrolisthesis and mild disc bulge. Moderate facet arthropathy. No spinal canal stenosis. Severe left and mild-to-moderate right neural foraminal narrowing, which has progressed on the right. L4-L5: Mild retrolisthesis and mild disc bulge. Moderate facet arthropathy. Narrowing of the lateral recesses. Mild spinal canal stenosis and moderate bilateral neural foraminal narrowing, unchanged. L5-S1: Mild disc bulge with right foraminal and extreme lateral protrusion. Moderate to severe facet arthropathy. Narrowing of the lateral recesses. No spinal canal stenosis. Mild right neural foraminal narrowing, unchanged. IMPRESSION: 1. 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 2. L3-L4 severe left and mild-to-moderate right neural foraminal narrowing, which has progressed on the right. 3. L4-L5 mild spinal canal stenosis and moderate bilateral neural foraminal narrowing. 4. L5-S1 mild right neural foraminal narrowing. 5. Narrowing of the lateral recesses at at L2-L3, L3-L4, L4-L5, and L5-S1 could affect the descending L3, L4, L5, and S1 nerve roots, respectively. Electronically Signed: By: Merilyn Baba M.D. On: 07/17/2022 14:53   DG Chest Port 1 View  Result Date: 07/17/2022 CLINICAL DATA:  Shortness of breath. EXAM: PORTABLE CHEST 1 VIEW COMPARISON:  Chest radiograph 04/05/2021, 03/07/2020 FINDINGS: The heart size and mediastinal contours are within normal limits. Aortic calcifications. Bibasilar patchy airspace opacities. No pleural effusion or pneumothorax. Multilevel degenerative changes in the mid to distal thoracic spine. IMPRESSION: Bibasilar patchy airspace opacities may reflect atelectasis versus multifocal pneumonia in the appropriate clinical context. Electronically Signed   By: Ileana Roup M.D.   On: 07/17/2022 16:14     Imaging independently  reviewed in Epic.  [facility]  Group [phone] pager 07/18/2022, 9:01 AM

## 2022-07-18 NOTE — Progress Notes (Addendum)
PHARMACY - PHYSICIAN COMMUNICATION CRITICAL VALUE ALERT - BLOOD CULTURE IDENTIFICATION (BCID)  Caleb Barton is an 82 y.o. male who presented to [facility] on 07/17/2022 with a chief complaint of bilateral LE weakness. CXR on 3/29 showed bibasilar patchy airspace opacities.  He was started on azithromycin and ceftriaxone on admission for suspected PNA.   Four of four blood culture bottles collected on 3/28 resulted back with GPC in chains (BCID = strep pyogenes).    Name of physician (or Provider) Contacted: Dr. Grandville Silos and Dr. Juleen China   Current antibiotics: ceftriaxone and azithromycin   Changes to prescribed antibiotics recommended:  - ID team to see patient. Will defer to Dr. Juleen China for abx recom.  Results for orders placed or performed during the hospital encounter of 07/17/22  Blood Culture ID Panel (Reflexed) (Collected: 07/17/2022  3:31 PM)  Result Value Ref Range   Enterococcus faecalis NOT DETECTED NOT DETECTED   Enterococcus Faecium NOT DETECTED NOT DETECTED   Listeria monocytogenes NOT DETECTED NOT DETECTED   Staphylococcus species NOT DETECTED NOT DETECTED   Staphylococcus aureus (BCID) NOT DETECTED NOT DETECTED   Staphylococcus epidermidis NOT DETECTED NOT DETECTED   Staphylococcus lugdunensis NOT DETECTED NOT DETECTED   Streptococcus species DETECTED (A) NOT DETECTED   Streptococcus agalactiae NOT DETECTED NOT DETECTED   Streptococcus pneumoniae NOT DETECTED NOT DETECTED   Streptococcus pyogenes DETECTED (A) NOT DETECTED   A.calcoaceticus-baumannii NOT DETECTED NOT DETECTED   Bacteroides fragilis NOT DETECTED NOT DETECTED   Enterobacterales NOT DETECTED NOT DETECTED   Enterobacter cloacae complex NOT DETECTED NOT DETECTED   Escherichia coli NOT DETECTED NOT DETECTED   Klebsiella aerogenes NOT DETECTED NOT DETECTED   Klebsiella oxytoca NOT DETECTED NOT DETECTED   Klebsiella pneumoniae NOT DETECTED NOT DETECTED   Proteus species NOT DETECTED NOT DETECTED   Salmonella  species NOT DETECTED NOT DETECTED   Serratia marcescens NOT DETECTED NOT DETECTED   Haemophilus influenzae NOT DETECTED NOT DETECTED   Neisseria meningitidis NOT DETECTED NOT DETECTED   Pseudomonas aeruginosa NOT DETECTED NOT DETECTED   Stenotrophomonas maltophilia NOT DETECTED NOT DETECTED   Candida albicans NOT DETECTED NOT DETECTED   Candida auris NOT DETECTED NOT DETECTED   Candida glabrata NOT DETECTED NOT DETECTED   Candida krusei NOT DETECTED NOT DETECTED   Candida parapsilosis NOT DETECTED NOT DETECTED   Candida tropicalis NOT DETECTED NOT DETECTED   Cryptococcus neoformans/gattii NOT DETECTED NOT DETECTED    Lynelle Doctor 07/18/2022  8:27 AM

## 2022-07-18 NOTE — Progress Notes (Signed)
  Echocardiogram 2D Echocardiogram has been performed.  Caleb Barton 07/18/2022, 3:48 PM

## 2022-07-18 NOTE — Progress Notes (Signed)
PROGRESS NOTE    CLOYS CORT  [id number] DOB: 1940-10-11 DOA: 07/17/2022 PCP: Glenis Smoker, MD    Chief Complaint  Patient presents with   Extremity Weakness    Brief Narrative:  Patient 82 year old gentleman history of paroxysmal atrial fibrillation on anticoagulation, hypertension, hyperlipidemia, OSA on CPAP, remote history of thalamic CVA, BPH, history of prostate cancer status postradiation treatment presented to the ED with sudden onset lower extremity weakness, worsening lower back pain.  Patient's status post ablation of nerves in the lower back by neurosurgery approximately 2 weeks prior to admission.  Patient presented sudden onset lower extremity weakness, noted to be on the ground for approximately 3 hours prior to getting help and EMS being called and brought to the ED.  Patient seen in the ED MRI of the L-spine done, MRI reviewed by neurosurgery who does not feel any surgical intervention needed at this time and does not feel patient's weakness secondary to lumbar spine.  Patient noted to spike a fever in the ED, chest x-ray done concerning for bilateral lower lobe pneumonia.  Patient pancultured blood cultures positive for Streptococcus pyogenes.  Patient placed empirically on IV antibiotics.  Patient also with complaints of right knee pain, plain films of the right knee done concerning for knee effusion.  ID, orthopedics consulted.  IV antibiotics adjusted per ID.   Assessment & Plan:   Principal Problem:   Sepsis due to group A Streptococcus without acute organ dysfunction (HCC) Active Problems:   Streptococcal bacteremia   HTN (hypertension)   Stroke [facility])   Sleep apnea   Right knee DJD   Hypercholesteremia   Malignant neoplasm of prostate (HCC)   Pneumonia   Lower extremity weakness   Lower back pain   Paroxysmal A-fib (HCC)   Knee pain, right  #1 sepsis secondary to Streptococcus pyogenes bacteremia -Patient presented with generalized weakness,  noted to have a fever in the ED, patient pancultured initial chest x-ray concerning for bibasilar pneumonia. -Blood cultures positive for Streptococcus pyogenes. -Unclear source. -MRI of the L-spine done on admission did not show any obvious evidence of abscess formation discitis. -Patient also noted with right prosthetic knee pain, plain films of the right knee done concerning for joint effusion. -Concern patient may have seeded to this joint, orthopedics consulted for possible aspiration and further evaluation. -2D echo ordered and pending. -ID consulted who have assessed the patient, IV antibiotics adjusted and patient now on penicillin G, linezolid.  2.  Right prosthetic knee pain -Patient with complaints of sudden onset right knee pain with difficulty bending his joint since admission. -Plain films done concerning for moderate to large joint effusion. -Orthopedics consulted for aspiration and further evaluation. -Continue current IV antibiotics.  3.?/Pneumonia -Patient noted on presentation to have a fever, chest x-ray done concerning for pneumonia. -Urine Legionella antigen pending, urine pneumococcus antigen pending. -Patient denies any significant change in chronic shortness of breath, no productive cough. -Continue empiric IV antibiotics.  4.  Mild rhabdomyolysis -CK noted to be minimally elevated on presentation as patient noted to have had a fall at home and was on the floor for approximately 3 hours. -1 L normal saline bolus. -IV fluids. -Repeat labs in AM.  5.  Transaminitis -Likely secondary to rhabdomyolysis and sepsis. -Acute hepatitis panel ordered, hep B IgM antibody nonreactive, hep B core antibody IgM nonreactive, hep C antibody nonreactive, hep B surface antigen pending. -IV fluids, supportive care. -If no improvement with transaminitis may need a right upper quadrant ultrasound. -  ID following.  6.  Paroxysmal atrial fibrillation -Currently normal sinus  rhythm. -Rate controlled. -Xarelto resumed however has been discontinued/held in anticipation of possible procedure.   7. Bilateral lower extremity weakness/low back pain -Patient with sudden onset bilateral lower extremity weakness in the early hours of this morning when he got up to go to the restroom leaving him on the ground for about 3 hours with inability to get up.  Patient denied any bowel or bladder incontinence however peed on the floor as he was unable to get up to go to the restroom.  Denies any numbness or tingling in his legs. -May be secondary to septic picture. -CK mildly elevated. 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  L3-L4 severe left and mild to moderate right neuroforaminal narrowing which has progressed on the right.  L4-L5 mild spinal canal stenosis and moderate bilateral neural foraminal narrowing.  L5-S1 mild right neuroforaminal narrowing.  Narrowing of the lateral recesses at L2-L3, L3-L4, L4-L5, and L5-S1 could affect the descending L3, L4, L5 and S1 nerve roots respectively.  ED physician discussed with neurosurgery who reviewed films and did not feel any surgical intervention needed at this time and recommended pain management and PT OT. -Case discussed with neurosurgery, Dr. Christella Noa who graciously agreed to see patient, reviewed films and did not feel patient's symptoms were related to any pathology in the back.  It was noted that there was no abscess or infection noted on MRI per neurosurgery, spinal cord was normal and significantly doubt if patient's symptoms are related to MRI of the L-spine. -Continue to hold statin. -Pain management.  PT/OT.  8.  BPH -Continue Flomax.  9.  Hyperlipidemia -Continue to hold statin in light of patient's bilateral lower extremity weakness and transaminitis.  10.  Dehydration -HCTZ on hold. -IV  fluids.  11.  Hypertension -HCTZ on hold. -Continue Cozaar.  12.  Sleep apnea -CPAP nightly.  13.  Malignant neoplasm of the prostate -Status post radiation treatment. -Outpatient follow-up with urology.    DVT prophylaxis: SCDs Code Status: DNR Family Communication: Updated patient.  No family at bedside. Disposition: TBD  Status is: Inpatient The patient will require care spanning > 2 midnights and should be moved to inpatient because: Severity of illness   Consultants:  ID: Dr. Juleen China 07/18/2022 Neurosurgery: Dr. Christella Noa 07/17/2022 Orthopedics pending  Procedures:  2D echo pending MRI L-spine 07/17/2022 Chest x-ray 07/17/2022 Plain films of the right knee 07/18/2022  Antimicrobials:  IV azithromycin 07/17/2022>>> 07/18/2022 IV Rocephin 07/17/2022>>>> 07/18/2022 IV Zyvox 07/18/2022>>>>> Penicillin G IV 07/18/2022 >>>>>   Subjective: Patient laying in bed.  Feels his breathing is labored however chronic in nature.  No chest pain.  No abdominal pain.  Complaining of right knee pain and difficulty bending the right knee which suddenly occurred over the past 24 hours.  Patient.  Overall feeling a little bit better than he did on admission.  No cough.  Objective: Vitals:   07/18/22 1200 07/18/22 1300 07/18/22 1400 07/18/22 1500  BP: 111/69 (!) 113/99 118/71 107/69  Pulse: 96 87 100 91  Resp: (!) 23 (!) 26 (!) 25 (!) 28  Temp:      TempSrc:      SpO2: 95% 96% 97% 94%  Weight:      Height:        Intake/Output Summary (Last 24 hours) at 07/18/2022 1556 Last  data filed at 07/18/2022 1358 Gross per 24 hour  Intake 360 ml  Output 300 ml  Net 60 ml   Filed Weights   07/17/22 1133  Weight: 109.8 kg    Examination:  General exam: Appears calm and comfortable  Respiratory system: Clear to auscultation bilaterally, no wheezes, no crackles, no rhonchi.  Fair air movement.  Speaking in full sentences.[name] Respiratory effort normal. Cardiovascular system: Regular rate rhythm  no murmurs rubs or gallops.  No JVD.  No significant lower extremity edema.  Gastrointestinal system: Abdomen is nondistended, soft and nontender. No organomegaly or masses felt. Normal bowel sounds heard. Central nervous system: Alert and oriented. No focal neurological deficits. Extremities: Right knee with some swelling, tenderness to palpation, slight warmth.  Pain with extension/flexion of right knee.  Skin: No rashes, lesions or ulcers Psychiatry: Judgement and insight appear normal. Mood & affect appropriate.     Data Reviewed: I have personally reviewed following labs and imaging studies  CBC: Recent Labs  Lab 07/17/22 1132 07/18/22 0341  WBC 16.3* 12.6*  NEUTROABS 14.9* 11.2*  HGB 16.0 14.4  HCT 47.2 42.8  MCV 95.9 97.9  PLT 159 124*    Basic Metabolic Panel: Recent Labs  Lab 07/17/22 1132 07/17/22 1950 07/18/22 0340 07/18/22 0341  NA 135  --   --  134*  K 3.2*  --   --  3.1*  CL 96*  --   --  97*  CO2 26  --   --  27  GLUCOSE 125*  --   --  110*  BUN 17  --   --  19  CREATININE 1.07  --   --  1.13  CALCIUM 9.0  --   --  8.3*  MG  --  1.8 2.1  --     GFR: Estimated Creatinine Clearance: 64.7 mL/min (by C-G formula based on SCr of 1.13 mg/dL).  Liver Function Tests: Recent Labs  Lab 07/18/22 0341  AST 157*  ALT 98*  ALKPHOS 57  BILITOT 3.2*  PROT 6.0*  ALBUMIN 3.3*    CBG: No results for input(s): "GLUCAP" in the last 168 hours.   Recent Results (from the past 240 hour(s))  SARS Coronavirus 2 by RT PCR (hospital order, performed in [facility] hospital lab) *cepheid single result test* Anterior Nasal Swab     Status: None   Collection Time: 07/17/22  3:13 PM   Specimen: Anterior Nasal Swab  Result Value Ref Range Status   SARS Coronavirus 2 by RT PCR NEGATIVE NEGATIVE Final    Comment: (NOTE) SARS-CoV-2 target nucleic acids are NOT DETECTED.  The SARS-CoV-2 RNA is generally detectable in upper and lower respiratory specimens during the  acute phase of infection. The lowest concentration of SARS-CoV-2 viral copies this assay can detect is 250 copies / mL. A negative result does not preclude SARS-CoV-2 infection and should not be used as the sole basis for treatment or other patient management decisions.  A negative result may occur with improper specimen collection / handling, submission of specimen other than nasopharyngeal swab, presence of viral mutation(s) within the areas targeted by this assay, and inadequate number of viral copies (<250 copies / mL). A negative result must be combined with clinical observations, patient history, and epidemiological information.  Fact Sheet for Patients:   https://www.patel.info/  Fact Sheet for Healthcare Providers: https://hall.com/  This test is not yet approved or  cleared by the [place] FDA and has been authorized for detection and/or  diagnosis of SARS-CoV-2 by FDA under an Emergency Use Authorization (EUA).  This EUA will remain in effect (meaning this test can be used) for the duration of the COVID-19 declaration under Section 564(b)(1) of the Act, 21 U.S.C. section 360bbb-3(b)(1), unless the authorization is terminated or revoked sooner.  Performed at [facility], [place] [address], [place], [place] [address]   Culture, blood (Routine X 2) w Reflex to ID Panel     Status: None (Preliminary result)   Collection Time: 07/17/22  3:31 PM   Specimen: Left Antecubital; Blood  Result Value Ref Range Status   Specimen Description   Final    LEFT ANTECUBITAL BLOOD Performed at [place] [address], [place], [place] [address]    Special Requests   Final    BOTTLES DRAWN AEROBIC AND ANAEROBIC Blood Culture adequate volume Performed at [place] [address], [place], [place] [address]    Culture  Setup Time   Final    GRAM POSITIVE COCCI IN CHAINS IN BOTH AEROBIC AND  ANAEROBIC BOTTLES CRITICAL RESULT CALLED TO, READ BACK BY AND VERIFIED WITH: PHARMD A ELLINGTON [id number] AT 80 BY CM Performed at [facility], [place] [address] [place], [place] [address]    Culture GRAM POSITIVE COCCI IN CHAINS  Final   Report Status PENDING  Incomplete  Blood Culture ID Panel (Reflexed)     Status: Abnormal   Collection Time: 07/17/22  3:31 PM  Result Value Ref Range Status   Enterococcus faecalis NOT DETECTED NOT DETECTED Final   Enterococcus Faecium NOT DETECTED NOT DETECTED Final   Listeria monocytogenes NOT DETECTED NOT DETECTED Final   Staphylococcus species NOT DETECTED NOT DETECTED Final   Staphylococcus aureus (BCID) NOT DETECTED NOT DETECTED Final   Staphylococcus epidermidis NOT DETECTED NOT DETECTED Final   Staphylococcus lugdunensis NOT DETECTED NOT DETECTED Final   Streptococcus species DETECTED (A) NOT DETECTED Final    Comment: CRITICAL RESULT CALLED TO, READ BACK BY AND VERIFIED WITH: PHARMD A ELLINGTON [id number] AT 815 AM BY CM    Streptococcus agalactiae NOT DETECTED NOT DETECTED Final   Streptococcus pneumoniae NOT DETECTED NOT DETECTED Final   Streptococcus pyogenes DETECTED (A) NOT DETECTED Final    Comment: CRITICAL RESULT CALLED TO, READ BACK BY AND VERIFIED WITH: PHARMD A ELLINGTON [id number] AT 815 AM BY CM    A.calcoaceticus-baumannii NOT DETECTED NOT DETECTED Final   Bacteroides fragilis NOT DETECTED NOT DETECTED Final   Enterobacterales NOT DETECTED NOT DETECTED Final   Enterobacter cloacae complex NOT DETECTED NOT DETECTED Final   Escherichia coli NOT DETECTED NOT DETECTED Final   Klebsiella aerogenes NOT DETECTED NOT DETECTED Final   Klebsiella oxytoca NOT DETECTED NOT DETECTED Final   Klebsiella pneumoniae NOT DETECTED NOT DETECTED Final   Proteus species NOT DETECTED NOT DETECTED Final   Salmonella species NOT DETECTED NOT DETECTED Final   Serratia marcescens NOT DETECTED NOT DETECTED Final   Haemophilus influenzae NOT DETECTED NOT  DETECTED Final   Neisseria meningitidis NOT DETECTED NOT DETECTED Final   Pseudomonas aeruginosa NOT DETECTED NOT DETECTED Final   Stenotrophomonas maltophilia NOT DETECTED NOT DETECTED Final   Candida albicans NOT DETECTED NOT DETECTED Final   Candida auris NOT DETECTED NOT DETECTED Final   Candida glabrata NOT DETECTED NOT DETECTED Final   Candida krusei NOT DETECTED NOT DETECTED Final   Candida parapsilosis NOT DETECTED NOT DETECTED Final   Candida tropicalis NOT DETECTED NOT DETECTED Final  Cryptococcus neoformans/gattii NOT DETECTED NOT DETECTED Final    Comment: Performed at [facility], [place] [address], [place], [place] [address]  Culture, blood (Routine X 2) w Reflex to ID Panel     Status: None (Preliminary result)   Collection Time: 07/17/22  3:43 PM   Specimen: Right Antecubital; Blood  Result Value Ref Range Status   Specimen Description   Final    RIGHT ANTECUBITAL BLOOD Performed at [facility], [place] [address] [place], [place] [address]    Special Requests   Final    BOTTLES DRAWN AEROBIC AND ANAEROBIC Blood Culture adequate volume Performed at [place] [address] [place], [place] [address]    Culture  Setup Time   Final    GRAM POSITIVE COCCI IN CHAINS IN BOTH AEROBIC AND ANAEROBIC BOTTLES CRITICAL VALUE NOTED.  VALUE IS CONSISTENT WITH PREVIOUSLY REPORTED AND CALLED VALUE.    Culture   Final    NO GROWTH < 12 HOURS Performed at [facility], [place] [address], [place],  [address]    Report Status PENDING  Incomplete         Radiology Studies: DG Knee Complete 4 Views Right  Result Date: 07/18/2022 CLINICAL DATA:  Right knee pain EXAM: RIGHT KNEE - COMPLETE 4 VIEW COMPARISON:  None Available. FINDINGS: Prior total right knee replacement. Moderate-to-large joint effusion. Hardware is intact. No perihardware lucency or fracture. Soft tissue swelling of the anterior knee. IMPRESSION: 1. Prior total right knee  replacement without evidence of hardware complication. 2. Moderate-to-large joint effusion. Electronically Signed   By: Yetta Glassman M.D.   On: 07/18/2022 11:19   MR Lumbar Spine W Wo Contrast  Addendum Date: 07/17/2022   ADDENDUM REPORT: 07/17/2022 17:13 IMPRESSION: 6. 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 Recommend follow-up ultrasound every 3 years. This recommendation follows ACR consensus guidelines: White Paper of the ACR Incidental Findings Committee II on Vascular Findings. J Am Coll Radiol 2013; 10:789-794. Electronically Signed   By: Merilyn Baba M.D.   On: 07/17/2022 17:13   Result Date: 07/17/2022 CLINICAL DATA:  Bilateral leg weakness, difficulty walking EXAM: MRI LUMBAR SPINE WITHOUT AND WITH CONTRAST TECHNIQUE: Multiplanar and multiecho pulse sequences of the lumbar spine were obtained without and with intravenous contrast. CONTRAST:  36mL GADAVIST GADOBUTROL 1 MMOL/ML IV SOLN COMPARISON:  08/07/2021 MRI lumbar spine FINDINGS: Segmentation:  5 lumbar type vertebral bodies. Alignment: Trace retrolisthesis of T12 on L1 and L1 on L2. 4 mm retrolisthesis of L3 on L4 and L4 on L5. Mild dextrocurvature. Vertebrae: No acute fracture, suspicious osseous lesion, or evidence of discitis. No abnormal osseous enhancement. Conus medullaris and cauda equina: Conus extends to the L1 level. Conus and cauda equina appear normal. No abnormal enhancement. Paraspinal and other soft tissues: Infrarenal abdominal aorta measures up to 3.1 cm. No lymphadenopathy. Atrophy of the right greater than left inferior paraspinous muscles. Disc levels: T12-L1: Trace retrolisthesis and minimal disc bulge. Mild facet arthropathy. No spinal canal stenosis or neural foraminal narrowing. L1-L2: Trace retrolisthesis and minimal disc bulge. Mild facet arthropathy. No spinal canal stenosis or neural foraminal narrowing. L2-L3: Mild disc bulge. Moderate facet arthropathy. Narrowing of the lateral recesses. Mild to moderate  spinal canal stenosis and mild left neural foraminal narrowing, which have progressed from the prior exam. L3-L4: Mild retrolisthesis and mild disc bulge. Moderate facet arthropathy. No spinal canal stenosis. Severe left and mild-to-moderate right neural foraminal narrowing, which has progressed on the right.  L4-L5: Mild retrolisthesis and mild disc bulge. Moderate facet arthropathy. Narrowing of the lateral recesses. Mild spinal canal stenosis and moderate bilateral neural foraminal narrowing, unchanged. L5-S1: Mild disc bulge with right foraminal and extreme lateral protrusion. Moderate to severe facet arthropathy. Narrowing of the lateral recesses. No spinal canal stenosis. Mild right neural foraminal narrowing, unchanged. IMPRESSION: 1. 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 2. L3-L4 severe left and mild-to-moderate right neural foraminal narrowing, which has progressed on the right. 3. L4-L5 mild spinal canal stenosis and moderate bilateral neural foraminal narrowing. 4. L5-S1 mild right neural foraminal narrowing. 5. Narrowing of the lateral recesses at at L2-L3, L3-L4, L4-L5, and L5-S1 could affect the descending L3, L4, L5, and S1 nerve roots, respectively. Electronically Signed: By: Merilyn Baba M.D. On: 07/17/2022 14:53   DG Chest Port 1 View  Result Date: 07/17/2022 CLINICAL DATA:  Shortness of breath. EXAM: PORTABLE CHEST 1 VIEW COMPARISON:  Chest radiograph 04/05/2021, 03/07/2020 FINDINGS: The heart size and mediastinal contours are within normal limits. Aortic calcifications. Bibasilar patchy airspace opacities. No pleural effusion or pneumothorax. Multilevel degenerative changes in the mid to distal thoracic spine. IMPRESSION: Bibasilar patchy airspace opacities may reflect atelectasis versus multifocal pneumonia in the appropriate clinical context. Electronically Signed   By: Ileana Roup M.D.   On:  07/17/2022 16:14        Scheduled Meds:  vitamin C  1,000 mg Oral Daily   dorzolamide-timolol  1 drop Both Eyes BID   latanoprost  1 drop Both Eyes QHS   losartan  50 mg Oral QHS   multivitamin with minerals  1 tablet Oral Daily   sodium chloride flush  3 mL Intravenous Q12H   tamsulosin  0.4 mg Oral Daily   Continuous Infusions:  lactated ringers 150 mL/hr at 07/18/22 1205   linezolid (ZYVOX) IV 600 mg (07/18/22 1051)   penicillin G potassium 12 Million Units in dextrose 5 % 500 mL CONTINUOUS infusion 12 Million Units (07/18/22 1301)     LOS: 0 days    Time spent: 40 minutes    Irine Seal, MD [facility]   To contact the attending provider between 7A-7P or the covering provider during after hours 7P-7A, please log into the web site [email or web address] and access using universal [place] password for that web site. If you do not have the password, please call the hospital operator.  07/18/2022, 3:56 PM

## 2022-07-18 NOTE — Progress Notes (Signed)
PT Cancellation Note  Patient Details Name: Caleb Barton MRN: [id number] DOB: 01-Dec-1940   Cancelled Treatment:    Reason Eval/Treat Not Completed: Medical issues which prohibited therapy  Pt was seen by OT this morning and max A x 2 for all mobility limited by knee pain.  Pt now s/p R knee aspiration with possible plan for debridement tomorrow pending results of aspiration.  Will hold PT due today due to knee pain with plan for possible intervention. Abran Richard, PT Acute Rehab [facility] [phone]  [name] 07/18/2022, 2:52 PM

## 2022-07-18 NOTE — Progress Notes (Signed)
  Echocardiogram 2D Echocardiogram attempted at 1409. MD currently talking w/ pt. Will attempt again later.  Caleb Barton 07/18/2022, 2:09 PM

## 2022-07-19 ENCOUNTER — Encounter (HOSPITAL_COMMUNITY)
Admission: EM | Disposition: A | Discharge status: Skilled Nursing Facility | Payer: Self-pay | Source: Home / Self Care | Attending: Internal Medicine

## 2022-07-19 DIAGNOSIS — T8450XD Infection and inflammatory reaction due to unspecified internal joint prosthesis, subsequent encounter: Secondary | ICD-10-CM

## 2022-07-19 DIAGNOSIS — J189 Pneumonia, unspecified organism: Secondary | ICD-10-CM | POA: Diagnosis not present

## 2022-07-19 DIAGNOSIS — T8450XA Infection and inflammatory reaction due to unspecified internal joint prosthesis, initial encounter: Secondary | ICD-10-CM

## 2022-07-19 DIAGNOSIS — R7881 Bacteremia: Secondary | ICD-10-CM | POA: Diagnosis not present

## 2022-07-19 DIAGNOSIS — A400 Sepsis due to streptococcus, group A: Secondary | ICD-10-CM | POA: Diagnosis not present

## 2022-07-19 DIAGNOSIS — M1711 Unilateral primary osteoarthritis, right knee: Secondary | ICD-10-CM | POA: Diagnosis not present

## 2022-07-19 LAB — CBC WITH DIFFERENTIAL/PLATELET
Abs Immature Granulocytes: 0.09 10*3/uL — ABNORMAL HIGH (ref 0.00–0.07)
Basophils Absolute: 0 10*3/uL (ref 0.0–0.1)
Basophils Relative: 0 %
Eosinophils Absolute: 0 10*3/uL (ref 0.0–0.5)
Eosinophils Relative: 0 %
HCT: 38.2 % — ABNORMAL LOW (ref 39.0–52.0)
Hemoglobin: 12.6 g/dL — ABNORMAL LOW (ref 13.0–17.0)
Immature Granulocytes: 1 %
Lymphocytes Relative: 5 %
Lymphs Abs: 0.6 10*3/uL — ABNORMAL LOW (ref 0.7–4.0)
MCH: 32.1 pg (ref 26.0–34.0)
MCHC: 33 g/dL (ref 30.0–36.0)
MCV: 97.4 fL (ref 80.0–100.0)
Monocytes Absolute: 0.8 10*3/uL (ref 0.1–1.0)
Monocytes Relative: 7 %
Neutro Abs: 10.3 10*3/uL — ABNORMAL HIGH (ref 1.7–7.7)
Neutrophils Relative %: 87 %
Platelets: 101 10*3/uL — ABNORMAL LOW (ref 150–400)
RBC: 3.92 MIL/uL — ABNORMAL LOW (ref 4.22–5.81)
RDW: 14.3 % (ref 11.5–15.5)
WBC: 11.8 10*3/uL — ABNORMAL HIGH (ref 4.0–10.5)
nRBC: 0 % (ref 0.0–0.2)

## 2022-07-19 LAB — COMPREHENSIVE METABOLIC PANEL
ALT: 87 U/L — ABNORMAL HIGH (ref 0–44)
AST: 111 U/L — ABNORMAL HIGH (ref 15–41)
Albumin: 3.3 g/dL — ABNORMAL LOW (ref 3.5–5.0)
Alkaline Phosphatase: 66 U/L (ref 38–126)
Anion gap: 8 (ref 5–15)
BUN: 21 mg/dL (ref 8–23)
CO2: 24 mmol/L (ref 22–32)
Calcium: 8.1 mg/dL — ABNORMAL LOW (ref 8.9–10.3)
Chloride: 99 mmol/L (ref 98–111)
Creatinine, Ser: 0.76 mg/dL (ref 0.61–1.24)
GFR, Estimated: >60 mL/min (ref >60)
Glucose, Bld: 127 mg/dL — ABNORMAL HIGH (ref 70–99)
Potassium: 3.7 mmol/L (ref 3.5–5.1)
Sodium: 131 mmol/L — ABNORMAL LOW (ref 135–145)
Total Bilirubin: 3.4 mg/dL — ABNORMAL HIGH (ref 0.3–1.2)
Total Protein: 5.8 g/dL — ABNORMAL LOW (ref 6.5–8.1)

## 2022-07-19 LAB — HEPATITIS PANEL, ACUTE
HCV Ab: NONREACTIVE
Hep A IgM: NONREACTIVE
Hep B C IgM: NONREACTIVE
Hepatitis B Surface Ag: REACTIVE — AB

## 2022-07-19 LAB — PROTIME-INR
INR: 1.8 — ABNORMAL HIGH (ref 0.8–1.2)
Prothrombin Time: 20.5 seconds — ABNORMAL HIGH (ref 11.4–15.2)

## 2022-07-19 LAB — CK: Total CK: 1931 U/L — ABNORMAL HIGH (ref 49–397)

## 2022-07-19 LAB — MAGNESIUM: Magnesium: 2.2 mg/dL (ref 1.7–2.4)

## 2022-07-19 LAB — GLUCOSE, CAPILLARY: Glucose-Capillary: 123 mg/dL — ABNORMAL HIGH (ref 70–99)

## 2022-07-19 LAB — STREP PNEUMONIAE URINARY ANTIGEN: Strep Pneumo Urinary Antigen: NEGATIVE

## 2022-07-19 SURGERY — IRRIGATION AND DEBRIDEMENT KNEE WITH POLY EXCHANGE
Anesthesia: General | Site: Knee | Laterality: Right

## 2022-07-19 MED ORDER — CHLORHEXIDINE GLUCONATE CLOTH 2 % EX PADS
6.0000 | MEDICATED_PAD | Freq: Every day | CUTANEOUS | Status: DC
  Administered 2022-07-19 – 2022-07-28 (×10): 6 via TOPICAL

## 2022-07-19 NOTE — Progress Notes (Addendum)
     Subjective: Patient continues have severe pain and swelling in the right knee.  Denies pain in other joints or extremities.  Denies distal numbness and tingling.  Notes that the right knee pain has been progressively worse over the past week to the point that he can no longer weight-bear.   Objective:   VITALS:   Vitals:   07/18/22 1500 07/18/22 2000 07/18/22 2100 07/19/22 0500  BP: 107/69 113/77 114/81 124/76  Pulse: 91 91  90  Resp: (!) 28 (!) 24  (!) 21  Temp:  98.6 F (37 C)  98 F (36.7 C)  TempSrc:  Oral  Oral  SpO2: 94% 95%  93%  Weight:    114.4 kg  Height:        Sensation intact distally Intact pulses distally Dorsiflexion/Plantar flexion intact Incision: Well-healed incision from prior knee replacement Compartment soft Significant tenderness about the right knee large effusion.  Nontender about the left knee, no appreciable effusion in the left knee.  Tolerates range of motion of the left lower extremity without significant discomfort.  Lab Results  Component Value Date   WBC 11.8 (H) 07/19/2022   HGB 12.6 (L) 07/19/2022   HCT 38.2 (L) 07/19/2022   MCV 97.4 07/19/2022   PLT 101 (L) 07/19/2022   BMET    Component Value Date/Time   NA 134 (L) 07/18/2022 0341   NA 143 07/27/2019 1513   K 3.1 (L) 07/18/2022 0341   CL 97 (L) 07/18/2022 0341   CO2 27 07/18/2022 0341   GLUCOSE 110 (H) 07/18/2022 0341   BUN 19 07/18/2022 0341   BUN 11 07/27/2019 1513   CREATININE 1.13 07/18/2022 0341   CALCIUM 8.3 (L) 07/18/2022 0341   GFRNONAA >60 07/18/2022 0341      Xray: X-rays right knee demonstrate no fracture description of the rash is a normalities  Assessment/Plan:     Principal Problem:   Sepsis due to group A Streptococcus without acute organ dysfunction (HCC) Active Problems:   HTN (hypertension)   Stroke (HCC)   Sleep apnea   Right knee DJD   Hypercholesteremia   Malignant neoplasm of prostate (HCC)   Pneumonia   Lower extremity weakness    Lower back pain   Paroxysmal A-fib (HCC)   Streptococcal bacteremia   Knee pain, right  Right knee arthroplasty done by Dr. Noemi Chapel 10 years ago concerning for acute prosthetic joint infection  Discussed with patient lateral cell count from the aspiration was low the Gram stain shows gram-positive cocci.  Given this finding and clinical picture concerning for acute septic joint arthritis.  Discussed surgical options including irrigation debridement with liner exchange versus removal of his knee replacement and two-stage antibiotic spacer.  Given acute presentation would recommend an I&D liner exchange at this point.  Risk and benefits including recurrence of infection, pain, swelling, blood loss, nerve injury, fracture were discussed.  Patient agreeable to proceed with surgery.  Will follow-up repeat INR this morning as well as cultures from the aspiration yesterday.   Annell Canty A Azlyn Wingler 07/19/2022, 7:26 AM   Charlies Constable, MD  Contact information:   [phone] 7am-5pm epic message Dr. Zachery Dakins, or call office for patient follow up: [phone] After hours and holidays please check [email or web address] for group call information for [facility]

## 2022-07-19 NOTE — Progress Notes (Signed)
[facility] for [facility]    Date of Admission:  07/17/2022   Total days of antibiotics 3   ID: Caleb Barton is a 82 y.o. male withgroup a strep bacteremia and acute right knee PJI Principal Problem:   Sepsis due to group A Streptococcus without acute organ dysfunction ([place]) Active Problems:   HTN (hypertension)   Stroke [facility])   Sleep apnea   Right knee DJD   Hypercholesteremia   Malignant neoplasm of prostate (HCC)   Pneumonia   Lower extremity weakness   Lower back pain   Paroxysmal A-fib (HCC)   Streptococcal bacteremia   Knee pain, right    Subjective: Afebrile.  Arthrocentesis showing gpc+. Discussed with dr Orvan Seen that it is acute onset and agree with plan for washout with liner exchange.  Medications:   vitamin C  1,000 mg Oral Daily   dorzolamide-timolol  1 drop Both Eyes BID   latanoprost  1 drop Both Eyes QHS   losartan  50 mg Oral QHS   multivitamin with minerals  1 tablet Oral Daily   sodium chloride flush  3 mL Intravenous Q12H   tamsulosin  0.4 mg Oral Daily    Objective: Vital signs in last 24 hours: Temp:  [98 F (36.7 C)-98.7 F (37.1 C)] 98.7 F (37.1 C) (03/30 1140) Pulse Rate:  [89-91] 89 (03/30 1140) Resp:  [21-28] 23 (03/30 1140) BP: (107-137)/(69-90) 137/90 (03/30 1140) SpO2:  [93 %-95 %] 95 % (03/30 1140) Weight:  [114.4 kg] 114.4 kg (03/30 0500)   Lab Results Recent Labs    07/18/22 0341 07/19/22 0451  WBC 12.6* 11.8*  HGB 14.4 12.6*  HCT 42.8 38.2*  NA 134* 131*  K 3.1* 3.7  CL 97* 99  CO2 27 24  BUN 19 21  CREATININE 1.13 0.76   Liver Panel Recent Labs    07/18/22 0341 07/19/22 0451  PROT 6.0* 5.8*  ALBUMIN 3.3* 3.3*  AST 157* 111*  ALT 98* 87*  ALKPHOS 57 66  BILITOT 3.2* 3.4*   Sedimentation Rate No results for input(s): "ESRSEDRATE" in the last 72 hours. C-Reactive Protein No results for input(s): "CRP" in the last 72 hours.  Microbiology: Blood cx 3/30 - pending Blood cx 3/28 -  group a strep Arthrocentesis 3/29 - gpc on gram stain Studies/Results: ECHOCARDIOGRAM COMPLETE  Result Date: 07/18/2022    ECHOCARDIOGRAM REPORT   Patient Name:   Caleb Barton Date of Exam: 07/18/2022 Medical Rec #:  [id number]      Height:       69.0 in Accession #:    [id number]     Weight:       242.0 lb Date of Birth:  [birth year]       BSA:          2.240 m Patient Age:    76 years       BP:           107/69 mmHg Patient Gender: M              HR:           103 bpm. Exam Location:  Inpatient Procedure: 2D Echo, Color Doppler and Cardiac Doppler Indications:    Bacteremia  History:        Patient has prior history of Echocardiogram examinations, most                 recent 06/30/2019. Stroke, Arrythmias:RBBB,  Signs/Symptoms:Dyspnea; Risk Factors:Sleep Apnea and                 Hypertension. Cancer.  Sonographer:    Eartha Inch Referring Phys: [id number] DANIEL V THOMPSON  Sonographer Comments: Technically difficult study due to poor echo windows. Image acquisition challenging due to patient body habitus and Image acquisition challenging due to respiratory motion. Patient unable to be positioned for optimal imaging. IMPRESSIONS  1. Left ventricular ejection fraction, by estimation, is 60 to 65%. The left ventricle has normal function. The left ventricle has no regional wall motion abnormalities. Left ventricular diastolic parameters are indeterminate.  2. Right ventricular systolic function is normal. The right ventricular size is normal. Tricuspid regurgitation signal is inadequate for assessing PA pressure.  3. No evidence of mitral valve regurgitation.  4. The aortic valve was not well visualized. Aortic valve regurgitation is not visualized.  5. Aneurysm of the aortic root, measuring 42 mm.  6. The inferior vena cava is dilated in size with <50% respiratory variability, suggesting right atrial pressure of 15 mmHg. Conclusion(s)/Recommendation(s): No large vegetations seen visualized. aortic  valve not well visualized. FINDINGS  Left Ventricle: Left ventricular ejection fraction, by estimation, is 60 to 65%. The left ventricle has normal function. The left ventricle has no regional wall motion abnormalities. The left ventricular internal cavity size was normal in size. There is  no left ventricular hypertrophy. Left ventricular diastolic parameters are indeterminate. Right Ventricle: The right ventricular size is normal. Right ventricular systolic function is normal. Tricuspid regurgitation signal is inadequate for assessing PA pressure. Left Atrium: Left atrial size was normal in size. Right Atrium: Right atrial size was normal in size. Pericardium: There is no evidence of pericardial effusion. Mitral Valve: No evidence of mitral valve regurgitation. MV peak gradient, 7.7 mmHg. The mean mitral valve gradient is 4.0 mmHg. Tricuspid Valve: Tricuspid valve regurgitation is not demonstrated. Aortic Valve: The aortic valve was not well visualized. Aortic valve regurgitation is not visualized. Pulmonic Valve: Pulmonic valve regurgitation is not visualized. Aorta: There is an aneurysm involving the aortic root measuring 42 mm. Venous: The inferior vena cava is dilated in size with less than 50% respiratory variability, suggesting right atrial pressure of 15 mmHg. IAS/Shunts: The interatrial septum was not well visualized.  LEFT VENTRICLE PLAX 2D LVIDd:         5.70 cm      Diastology LVIDs:         4.50 cm      LV e' medial:    5.87 cm/s LV PW:         0.90 cm      LV E/e' medial:  22.0 LV IVS:        0.80 cm      LV e' lateral:   9.57 cm/s LVOT diam:     2.30 cm      LV E/e' lateral: 13.5 LV SV:         73 LV SV Index:   33 LVOT Area:     4.15 cm  LV Volumes (MOD) LV vol d, MOD A2C: 104.0 ml LV vol d, MOD A4C: 63.5 ml LV vol s, MOD A2C: 54.0 ml LV vol s, MOD A4C: 38.3 ml LV SV MOD A2C:     50.0 ml LV SV MOD A4C:     63.5 ml LV SV MOD BP:      37.3 ml RIGHT VENTRICLE            IVC RV  S prime:     9.57 cm/s   IVC diam: 2.50 cm TAPSE (M-mode): 0.9 cm LEFT ATRIUM             Index        RIGHT ATRIUM           Index LA diam:        3.80 cm 1.70 cm/m   RA Area:     21.30 cm LA Vol (A2C):   75.7 ml 33.79 ml/m  RA Volume:   56.10 ml  25.04 ml/m LA Vol (A4C):   58.6 ml 26.16 ml/m LA Biplane Vol: 72.2 ml 32.23 ml/m  AORTIC VALVE LVOT Vmax:   103.00 cm/s LVOT Vmean:  80.900 cm/s LVOT VTI:    0.176 m  AORTA Ao Root diam: 4.20 cm Ao Asc diam:  4.40 cm MITRAL VALVE MV Area (PHT): 5.16 cm     SHUNTS MV Area VTI:   3.18 cm     Systemic VTI:  0.18 m MV Peak grad:  7.7 mmHg     Systemic Diam: 2.30 cm MV Mean grad:  4.0 mmHg MV Vmax:       1.39 m/s MV Vmean:      93.7 cm/s MV Decel Time: 147 msec MV E velocity: 129.00 cm/s Phineas Inches Electronically signed by Phineas Inches Signature Date/Time: 07/18/2022/4:37:12 PM    Final    DG Knee Complete 4 Views Right  Result Date: 07/18/2022 CLINICAL DATA:  Right knee pain EXAM: RIGHT KNEE - COMPLETE 4 VIEW COMPARISON:  None Available. FINDINGS: Prior total right knee replacement. Moderate-to-large joint effusion. Hardware is intact. No perihardware lucency or fracture. Soft tissue swelling of the anterior knee. IMPRESSION: 1. Prior total right knee replacement without evidence of hardware complication. 2. Moderate-to-large joint effusion. Electronically Signed   By: Yetta Glassman M.D.   On: 07/18/2022 11:19   DG Chest Port 1 View  Result Date: 07/17/2022 CLINICAL DATA:  Shortness of breath. EXAM: PORTABLE CHEST 1 VIEW COMPARISON:  Chest radiograph 04/05/2021, 03/07/2020 FINDINGS: The heart size and mediastinal contours are within normal limits. Aortic calcifications. Bibasilar patchy airspace opacities. No pleural effusion or pneumothorax. Multilevel degenerative changes in the mid to distal thoracic spine. IMPRESSION: Bibasilar patchy airspace opacities may reflect atelectasis versus multifocal pneumonia in the appropriate clinical context. Electronically Signed   By: Ileana Roup M.D.   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 Anticipate wash out tomorrow.  Will start chg bathing  today in preparation for surgery tomorrow  [facility] Pager: [phone]  07/19/2022, 2:42 PM

## 2022-07-19 NOTE — Progress Notes (Signed)
PROGRESS NOTE    Caleb Barton  [id number] DOB: 04-21-1941 DOA: 07/17/2022 PCP: Glenis Smoker, MD    Chief Complaint  Patient presents with   Extremity Weakness    Brief Narrative:  Patient 82 year old gentleman history of paroxysmal atrial fibrillation on anticoagulation, hypertension, hyperlipidemia, OSA on CPAP, remote history of thalamic CVA, BPH, history of prostate cancer status postradiation treatment presented to the ED with sudden onset lower extremity weakness, worsening lower back pain.  Patient's status post ablation of nerves in the lower back by neurosurgery approximately 2 weeks prior to admission.  Patient presented sudden onset lower extremity weakness, noted to be on the ground for approximately 3 hours prior to getting help and EMS being called and brought to the ED.  Patient seen in the ED MRI of the L-spine done, MRI reviewed by neurosurgery who does not feel any surgical intervention needed at this time and does not feel patient's weakness secondary to lumbar spine.  Patient noted to spike a fever in the ED, chest x-ray done concerning for bilateral lower lobe pneumonia.  Patient pancultured blood cultures positive for Streptococcus pyogenes.  Patient placed empirically on IV antibiotics.  Patient also with complaints of right knee pain, plain films of the right knee done concerning for knee effusion.  ID, orthopedics consulted.  IV antibiotics adjusted per ID.   Assessment & Plan:   Principal Problem:   Sepsis due to group A Streptococcus without acute organ dysfunction (HCC) Active Problems:   Streptococcal bacteremia   HTN (hypertension)   Stroke [facility])   Sleep apnea   Right knee DJD   Hypercholesteremia   Malignant neoplasm of prostate (HCC)   Pneumonia   Lower extremity weakness   Lower back pain   Paroxysmal A-fib (HCC)   Knee pain, right   Prosthetic joint infection (HCC)  #1 sepsis secondary to Streptococcus pyogenes bacteremia -Patient  presented with generalized weakness, noted to have a fever in the ED, patient pancultured initial chest x-ray concerning for bibasilar pneumonia. -Blood cultures positive for Streptococcus pyogenes. -Unclear source. -MRI of the L-spine done on admission did not show any obvious evidence of abscess formation discitis. -Patient also noted with right prosthetic knee pain, plain films of the right knee done concerning for joint effusion. -Concern patient may have seeded to this joint, orthopedics consulted for possible aspiration and further evaluation. -2D echo ordered with normal EF, no significant vegetations noted.   -??  TEE, will defer to ID.  -ID consulted who have assessed the patient, IV antibiotics adjusted and patient now on penicillin G, linezolid.  2.  Right prosthetic knee infection -Patient with complaints of sudden onset right knee pain with difficulty bending his joint since admission. -Plain films done concerning for moderate to large joint effusion. -Orthopedics consulted and patient subsequently underwent right knee aspiration with preliminary cultures with rare gram-positive cocci in chains.   -Due to concern for prosthetic joint infection of the right knee, orthopedics recommending and planning for washout with liner exchange to be done tomorrow. -Continue current IV penicillin. -ID and orthopedics following.  3.?/Pneumonia -Patient noted on presentation to have a fever, chest x-ray done concerning for pneumonia. -Urine Legionella antigen pending, urine pneumococcus antigen pending. -Patient denies any significant change in chronic shortness of breath, no productive cough. -Continue empiric IV antibiotics.  4.  Mild rhabdomyolysis -CK noted to be minimally elevated on presentation as patient noted to have had a fall at home and was on the floor for approximately 3  hours. -Continue IV fluid resuscitation. -CK levels trending down. -Repeat labs in AM.  5.   Transaminitis -Likely secondary to rhabdomyolysis and sepsis. -Acute hepatitis panel ordered, hep B IgM antibody nonreactive, hep B core antibody IgM nonreactive, hep C antibody nonreactive, hep B surface antigen reactive.. -LFTs trending down. -IV fluids, supportive care. -ID following.  6.  Paroxysmal atrial fibrillation -Currently in A-fib but rate controlled.  -Xarelto resumed however has been discontinued/held in anticipation of possible procedure.   7. Bilateral lower extremity weakness/low back pain -Patient with sudden onset bilateral lower extremity weakness in the early hours of this morning when he got up to go to the restroom leaving him on the ground for about 3 hours with inability to get up.  Patient denied any bowel or bladder incontinence however peed on the floor as he was unable to get up to go to the restroom.  Denies any numbness or tingling in his legs. -May be secondary to septic picture. -CK mildly elevated. 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  L3-L4 severe left and mild to moderate right neuroforaminal narrowing which has progressed on the right.  L4-L5 mild spinal canal stenosis and moderate bilateral neural foraminal narrowing.  L5-S1 mild right neuroforaminal narrowing.  Narrowing of the lateral recesses at L2-L3, L3-L4, L4-L5, and L5-S1 could affect the descending L3, L4, L5 and S1 nerve roots respectively.  ED physician discussed with neurosurgery who reviewed films and did not feel any surgical intervention needed at this time and recommended pain management and PT OT. -Case discussed with neurosurgery, Dr. Christella Noa who graciously agreed to see patient, reviewed films and did not feel patient's symptoms were related to any pathology in the back.  It was noted that there was no abscess or infection noted on MRI per neurosurgery, spinal  cord was normal and significantly doubt if patient's symptoms are related to MRI of the L-spine. -Continue to hold statin. -Pain management.  PT/OT.  8.  BPH -Flomax.   9.  Hyperlipidemia -Continue to hold statin in light of patient's bilateral lower extremity weakness and transaminitis.  10.  Dehydration -HCTZ on hold. -IV fluids.  11.  Hypertension -Blood pressure currently stable.   -Continue Cozaar.   -Continue to hold HCTZ.   12.  Sleep apnea -CPAP nightly.  13.  Malignant neoplasm of the prostate -Status post radiation treatment. -Outpatient follow-up with urology.    DVT prophylaxis: SCDs Code Status: DNR Family Communication: Updated patient, wife at bedside. Disposition: TBD  Status is: Inpatient The patient will require care spanning > 2 midnights and should be moved to inpatient because: Severity of illness   Consultants:  ID: Dr. Juleen China 07/18/2022 Neurosurgery: Dr. Christella Noa 07/17/2022 Orthopedics: Dr. Mardelle Matte 07/18/2022  Procedures:  2D echo 07/18/2022 MRI L-spine 07/17/2022 Chest x-ray 07/17/2022 Plain films of the right knee 07/18/2022 Right knee aspiration of synovial fluid from the joint per orthopedics: Dr. Mardelle Matte 07/18/2022  Antimicrobials:  IV azithromycin 07/17/2022>>> 07/18/2022 IV Rocephin 07/17/2022>>>> 07/18/2022 IV Zyvox 07/18/2022>>>>> Penicillin G IV 07/18/2022 >>>>>   Subjective: Patient laying in bed.  Wife at bedside.  Still with some right knee pain but slightly improved from yesterday after aspiration.  No chest pain.  No significant change in chronic shortness of breath.  No abdominal pain.  Stated saw orthopedic surgeon this morning and they are planning on surgery on his right knee this afternoon.  Still with significant lower extremity weakness.  Objective: Vitals:   07/18/22 2000 07/18/22 2100 07/19/22 0500 07/19/22 1140  BP: 113/77 114/81 124/76 (!) 137/90  Pulse: 91  90 89  Resp: (!) 24  (!) 21 (!) 23  Temp: 98.6 F (37 C)  98  F (36.7 C) 98.7 F (37.1 C)  TempSrc: Oral  Oral Oral  SpO2: 95%  93% 95%  Weight:   114.4 kg   Height:        Intake/Output Summary (Last 24 hours) at 07/19/2022 1521 Last data filed at 07/19/2022 1440 Gross per 24 hour  Intake 1056.01 ml  Output 700 ml  Net 356.01 ml   Filed Weights   07/17/22 1133 07/19/22 0500  Weight: 109.8 kg 114.4 kg    Examination:  General exam: NAD Respiratory system: CTAB.  No wheezes, no crackles, no rhonchi.  Fair air movement.  Speaking in full sentences.  Normal respiratory effort.   Cardiovascular system: Irregularly irregular.  No JVD.  No significant lower extremity edema.  Gastrointestinal system: Abdomen soft, nontender, nondistended, positive bowel sounds.  No rebound.  No guarding.  Central nervous system: Alert and oriented. No focal neurological deficits. Extremities: Right knee with some swelling, tenderness to palpation, decreased warmth.  Some pain with extension/flexion of the right knee. 2-3/5 bilateral lower extremity strength. Skin: No rashes, lesions or ulcers Psychiatry: Judgement and insight appear normal. Mood & affect appropriate.     Data Reviewed: I have personally reviewed following labs and imaging studies  CBC: Recent Labs  Lab 07/17/22 1132 07/18/22 0341 07/19/22 0451  WBC 16.3* 12.6* 11.8*  NEUTROABS 14.9* 11.2* 10.3*  HGB 16.0 14.4 12.6*  HCT 47.2 42.8 38.2*  MCV 95.9 97.9 97.4  PLT 159 124* 101*    Basic Metabolic Panel: Recent Labs  Lab 07/17/22 1132 07/17/22 1950 07/18/22 0340 07/18/22 0341 07/19/22 0451  NA 135  --   --  134* 131*  K 3.2*  --   --  3.1* 3.7  CL 96*  --   --  97* 99  CO2 26  --   --  27 24  GLUCOSE 125*  --   --  110* 127*  BUN 17  --   --  19 21  CREATININE 1.07  --   --  1.13 0.76  CALCIUM 9.0  --   --  8.3* 8.1*  MG  --  1.8 2.1  --  2.2    GFR: Estimated Creatinine Clearance: 93.4 mL/min (by C-G formula based on SCr of 0.76 mg/dL).  Liver Function Tests: Recent  Labs  Lab 07/18/22 0341 07/19/22 0451  AST 157* 111*  ALT 98* 87*  ALKPHOS 57 66  BILITOT 3.2* 3.4*  PROT 6.0* 5.8*  ALBUMIN 3.3* 3.3*    CBG: Recent Labs  Lab 07/19/22 0751  GLUCAP 123*     Recent Results (from the past 240 hour(s))  SARS Coronavirus 2 by RT PCR (hospital order, performed in [facility] hospital lab) *cepheid single result test* Anterior Nasal Swab     Status: None   Collection Time: 07/17/22  3:13 PM   Specimen: Anterior Nasal Swab  Result Value Ref Range Status   SARS Coronavirus 2 by RT PCR NEGATIVE NEGATIVE Final    Comment: (NOTE) SARS-CoV-2 target nucleic acids are NOT DETECTED.  The SARS-CoV-2 RNA is generally detectable in upper and lower respiratory specimens during the acute phase of infection. The lowest concentration of SARS-CoV-2 viral copies this assay can  detect is 250 copies / mL. A negative result does not preclude SARS-CoV-2 infection and should not be used as the sole basis for treatment or other patient management decisions.  A negative result may occur with improper specimen collection / handling, submission of specimen other than nasopharyngeal swab, presence of viral mutation(s) within the areas targeted by this assay, and inadequate number of viral copies (<250 copies / mL). A negative result must be combined with clinical observations, patient history, and epidemiological information.  Fact Sheet for Patients:   https://www.patel.info/  Fact Sheet for Healthcare Providers: https://hall.com/  This test is not yet approved or  cleared by the [place] FDA and has been authorized for detection and/or diagnosis of SARS-CoV-2 by FDA under an Emergency Use Authorization (EUA).  This EUA will remain in effect (meaning this test can be used) for the duration of the COVID-19 declaration under Section 564(b)(1) of the Act, 21 U.S.C. section 360bbb-3(b)(1), unless the authorization is  terminated or revoked sooner.  Performed at [facility], [place] [address], [place], [place] [address]   Culture, blood (Routine X 2) w Reflex to ID Panel     Status: None (Preliminary result)   Collection Time: 07/17/22  3:31 PM   Specimen: Left Antecubital; Blood  Result Value Ref Range Status   Specimen Description   Final    LEFT ANTECUBITAL BLOOD Performed at [place] [address], [place], [place] [address]    Special Requests   Final    BOTTLES DRAWN AEROBIC AND ANAEROBIC Blood Culture adequate volume Performed at [place] [address] [place], [place] [address]    Culture  Setup Time   Final    GRAM POSITIVE COCCI IN CHAINS IN BOTH AEROBIC AND ANAEROBIC BOTTLES CRITICAL RESULT CALLED TO, READ BACK BY AND VERIFIED WITH: PHARMD A Buford Dresser [id number] AT 22 BY CM    Culture   Final    GRAM POSITIVE COCCI IDENTIFICATION AND SUSCEPTIBILITIES TO FOLLOW Performed at [facility], [place] [address] [place], [place] [address]    Report Status PENDING  Incomplete  Blood Culture ID Panel (Reflexed)     Status: Abnormal   Collection Time: 07/17/22  3:31 PM  Result Value Ref Range Status   Enterococcus faecalis NOT DETECTED NOT DETECTED Final   Enterococcus Faecium NOT DETECTED NOT DETECTED Final   Listeria monocytogenes NOT DETECTED NOT DETECTED Final   Staphylococcus species NOT DETECTED NOT DETECTED Final   Staphylococcus aureus (BCID) NOT DETECTED NOT DETECTED Final   Staphylococcus epidermidis NOT DETECTED NOT DETECTED Final   Staphylococcus lugdunensis NOT DETECTED NOT DETECTED Final   Streptococcus species DETECTED (A) NOT DETECTED Final    Comment: CRITICAL RESULT CALLED TO, READ BACK BY AND VERIFIED WITH: PHARMD A ELLINGTON [id number] AT 815 AM BY CM    Streptococcus agalactiae NOT DETECTED NOT DETECTED Final   Streptococcus pneumoniae NOT DETECTED NOT DETECTED Final   Streptococcus pyogenes DETECTED (A) NOT  DETECTED Final    Comment: CRITICAL RESULT CALLED TO, READ BACK BY AND VERIFIED WITH: PHARMD A ELLINGTON [id number] AT 815 AM BY CM    A.calcoaceticus-baumannii NOT DETECTED NOT DETECTED Final   Bacteroides fragilis NOT DETECTED NOT DETECTED Final   Enterobacterales NOT DETECTED NOT DETECTED Final   Enterobacter cloacae complex NOT DETECTED NOT DETECTED Final   Escherichia coli NOT DETECTED NOT DETECTED Final   Klebsiella aerogenes NOT DETECTED NOT DETECTED Final   Klebsiella oxytoca NOT DETECTED NOT  DETECTED Final   Klebsiella pneumoniae NOT DETECTED NOT DETECTED Final   Proteus species NOT DETECTED NOT DETECTED Final   Salmonella species NOT DETECTED NOT DETECTED Final   Serratia marcescens NOT DETECTED NOT DETECTED Final   Haemophilus influenzae NOT DETECTED NOT DETECTED Final   Neisseria meningitidis NOT DETECTED NOT DETECTED Final   Pseudomonas aeruginosa NOT DETECTED NOT DETECTED Final   Stenotrophomonas maltophilia NOT DETECTED NOT DETECTED Final   Candida albicans NOT DETECTED NOT DETECTED Final   Candida auris NOT DETECTED NOT DETECTED Final   Candida glabrata NOT DETECTED NOT DETECTED Final   Candida krusei NOT DETECTED NOT DETECTED Final   Candida parapsilosis NOT DETECTED NOT DETECTED Final   Candida tropicalis NOT DETECTED NOT DETECTED Final   Cryptococcus neoformans/gattii NOT DETECTED NOT DETECTED Final    Comment: Performed at [facility], [place] [address] [place], [place] [address]  Culture, blood (Routine X 2) w Reflex to ID Panel     Status: None (Preliminary result)   Collection Time: 07/17/22  3:43 PM   Specimen: Right Antecubital; Blood  Result Value Ref Range Status   Specimen Description   Final    RIGHT ANTECUBITAL BLOOD Performed at [facility], [place] [address], [place], [place] [address]    Special Requests   Final    BOTTLES DRAWN AEROBIC AND ANAEROBIC Blood Culture adequate volume Performed at [place] [address] [place], [place] [address]    Culture  Setup Time   Final    GRAM POSITIVE COCCI IN CHAINS IN BOTH AEROBIC AND ANAEROBIC BOTTLES CRITICAL VALUE NOTED.  VALUE IS CONSISTENT WITH PREVIOUSLY REPORTED AND CALLED VALUE.    Culture   Final    GRAM POSITIVE COCCI IDENTIFICATION TO FOLLOW Performed at [facility], [place] [address] [place], [place] [address]    Report Status PENDING  Incomplete  Body fluid culture w Gram Stain     Status: None (Preliminary result)   Collection Time: 07/18/22  2:23 PM   Specimen: Synovium; Synovial Fluid  Result Value Ref Range Status   Specimen Description   Final    SYNOVIAL R KNEE Performed at [place] [address] [place], [place] [address]    Special Requests   Final    NONE Performed at [facility], [place] [address], [place] , [place] [address]    Gram Stain   Final    MODERATE WBC PRESENT, PREDOMINANTLY PMN RARE GRAM POSITIVE COCCI IN CHAINS INTRACELLULAR CRITICAL RESULT CALLED TO, READ BACK BY AND VERIFIED WITH: RN REBECCA FEWELL ON 07/18/22 @ 2152 BY DRT    Culture   Final    NO GROWTH < 24 HOURS Performed at [place] [address], [place], [place] [address]    Report Status PENDING  Incomplete  Culture, blood (Routine X 2) w Reflex to ID Panel     Status: None (Preliminary result)   Collection Time: 07/19/22  4:49 AM   Specimen: BLOOD LEFT HAND  Result Value Ref Range Status   Specimen Description   Final    BLOOD LEFT HAND Performed at [facility], [place] [address] [place], [place] [address]    Special Requests   Final    BOTTLES DRAWN AEROBIC ONLY Blood Culture adequate volume Performed at [place] [address] [place], [place] [address]    Culture PENDING  Incomplete   Report Status PENDING  Incomplete  Culture, blood (Routine X 2) w Reflex to ID Panel     Status: None (Preliminary result)   Collection Time: 07/19/22  4:51 AM    Specimen: BLOOD RIGHT HAND  Result Value Ref Range Status   Specimen Description   Final    BLOOD RIGHT HAND Performed at [facility], [place] [address], [place], [place] [address]    Special Requests   Final    BOTTLES DRAWN AEROBIC AND ANAEROBIC Blood Culture adequate volume Performed at [place] [address], [place], [place] [address]    Culture PENDING  Incomplete   Report Status PENDING  Incomplete         Radiology Studies: ECHOCARDIOGRAM COMPLETE  Result Date: 07/18/2022    ECHOCARDIOGRAM REPORT   Patient Name:   SYLAR FOXX Date of Exam: 07/18/2022 Medical Rec #:  [id number]      Height:       69.0 in Accession #:    [id number]     Weight:       242.0 lb Date of Birth:  1940-12-27       BSA:          2.240 m Patient Age:    54 years       BP:           107/69 mmHg Patient Gender: M              HR:           103 bpm. Exam Location:  Inpatient Procedure: 2D Echo, Color Doppler and Cardiac Doppler Indications:    Bacteremia  History:        Patient has prior history of Echocardiogram examinations, most                 recent 06/30/2019. Stroke, Arrythmias:RBBB,                 Signs/Symptoms:Dyspnea; Risk Factors:Sleep Apnea and                 Hypertension. Cancer.  Sonographer:    Eartha Inch Referring Phys: [id number] Noelene Gang V Tashia Leiterman  Sonographer Comments: Technically difficult study due to poor echo windows. Image acquisition challenging due to patient body habitus and Image acquisition challenging due to respiratory motion. Patient unable to be positioned for optimal imaging. IMPRESSIONS  1. Left ventricular ejection fraction, by estimation, is 60 to 65%. The left ventricle has normal function. The left ventricle has no regional wall motion abnormalities. Left ventricular diastolic parameters are indeterminate.  2. Right ventricular systolic function is normal. The right ventricular size is normal. Tricuspid regurgitation signal is inadequate for  assessing PA pressure.  3. No evidence of mitral valve regurgitation.  4. The aortic valve was not well visualized. Aortic valve regurgitation is not visualized.  5. Aneurysm of the aortic root, measuring 42 mm.  6. The inferior vena cava is dilated in size with <50% respiratory variability, suggesting right atrial pressure of 15 mmHg. Conclusion(s)/Recommendation(s): No large vegetations seen visualized. aortic valve not well visualized. FINDINGS  Left Ventricle: Left ventricular ejection fraction, by estimation, is 60 to 65%. The left ventricle has normal function. The left ventricle has no regional wall motion abnormalities. The left ventricular internal cavity size was normal in size. There is  no left ventricular hypertrophy. Left ventricular diastolic parameters are indeterminate. Right Ventricle: The right ventricular size is normal. Right ventricular systolic function is normal. Tricuspid regurgitation signal is inadequate for  assessing PA pressure. Left Atrium: Left atrial size was normal in size. Right Atrium: Right atrial size was normal in size. Pericardium: There is no evidence of pericardial effusion. Mitral Valve: No evidence of mitral valve regurgitation. MV peak gradient, 7.7 mmHg. The mean mitral valve gradient is 4.0 mmHg. Tricuspid Valve: Tricuspid valve regurgitation is not demonstrated. Aortic Valve: The aortic valve was not well visualized. Aortic valve regurgitation is not visualized. Pulmonic Valve: Pulmonic valve regurgitation is not visualized. Aorta: There is an aneurysm involving the aortic root measuring 42 mm. Venous: The inferior vena cava is dilated in size with less than 50% respiratory variability, suggesting right atrial pressure of 15 mmHg. IAS/Shunts: The interatrial septum was not well visualized.  LEFT VENTRICLE PLAX 2D LVIDd:         5.70 cm      Diastology LVIDs:         4.50 cm      LV e' medial:    5.87 cm/s LV PW:         0.90 cm      LV E/e' medial:  22.0 LV IVS:         0.80 cm      LV e' lateral:   9.57 cm/s LVOT diam:     2.30 cm      LV E/e' lateral: 13.5 LV SV:         73 LV SV Index:   33 LVOT Area:     4.15 cm  LV Volumes (MOD) LV vol d, MOD A2C: 104.0 ml LV vol d, MOD A4C: 63.5 ml LV vol s, MOD A2C: 54.0 ml LV vol s, MOD A4C: 38.3 ml LV SV MOD A2C:     50.0 ml LV SV MOD A4C:     63.5 ml LV SV MOD BP:      37.3 ml RIGHT VENTRICLE            IVC RV S prime:     9.57 cm/s  IVC diam: 2.50 cm TAPSE (M-mode): 0.9 cm LEFT ATRIUM             Index        RIGHT ATRIUM           Index LA diam:        3.80 cm 1.70 cm/m   RA Area:     21.30 cm LA Vol (A2C):   75.7 ml 33.79 ml/m  RA Volume:   56.10 ml  25.04 ml/m LA Vol (A4C):   58.6 ml 26.16 ml/m LA Biplane Vol: 72.2 ml 32.23 ml/m  AORTIC VALVE LVOT Vmax:   103.00 cm/s LVOT Vmean:  80.900 cm/s LVOT VTI:    0.176 m  AORTA Ao Root diam: 4.20 cm Ao Asc diam:  4.40 cm MITRAL VALVE MV Area (PHT): 5.16 cm     SHUNTS MV Area VTI:   3.18 cm     Systemic VTI:  0.18 m MV Peak grad:  7.7 mmHg     Systemic Diam: 2.30 cm MV Mean grad:  4.0 mmHg MV Vmax:       1.39 m/s MV Vmean:      93.7 cm/s MV Decel Time: 147 msec MV E velocity: 129.00 cm/s Phineas Inches Electronically signed by Phineas Inches Signature Date/Time: 07/18/2022/4:37:12 PM    Final    DG Knee Complete 4 Views Right  Result Date: 07/18/2022 CLINICAL DATA:  Right knee pain EXAM: RIGHT KNEE - COMPLETE 4 VIEW COMPARISON:  None  Available. FINDINGS: Prior total right knee replacement. Moderate-to-large joint effusion. Hardware is intact. No perihardware lucency or fracture. Soft tissue swelling of the anterior knee. IMPRESSION: 1. Prior total right knee replacement without evidence of hardware complication. 2. Moderate-to-large joint effusion. Electronically Signed   By: Yetta Glassman M.D.   On: 07/18/2022 11:19   DG Chest Port 1 View  Result Date: 07/17/2022 CLINICAL DATA:  Shortness of breath. EXAM: PORTABLE CHEST 1 VIEW COMPARISON:  Chest radiograph 04/05/2021, 03/07/2020  FINDINGS: The heart size and mediastinal contours are within normal limits. Aortic calcifications. Bibasilar patchy airspace opacities. No pleural effusion or pneumothorax. Multilevel degenerative changes in the mid to distal thoracic spine. IMPRESSION: Bibasilar patchy airspace opacities may reflect atelectasis versus multifocal pneumonia in the appropriate clinical context. Electronically Signed   By: Ileana Roup M.D.   On: 07/17/2022 16:14        Scheduled Meds:  vitamin C  1,000 mg Oral Daily   Chlorhexidine Gluconate Cloth  6 each Topical Q0600   dorzolamide-timolol  1 drop Both Eyes BID   latanoprost  1 drop Both Eyes QHS   losartan  50 mg Oral QHS   multivitamin with minerals  1 tablet Oral Daily   sodium chloride flush  3 mL Intravenous Q12H   tamsulosin  0.4 mg Oral Daily   Continuous Infusions:  lactated ringers 125 mL/hr at 07/19/22 0650   penicillin G potassium 12 Million Units in dextrose 5 % 500 mL CONTINUOUS infusion 12 Million Units (07/18/22 2213)     LOS: 1 day    Time spent: 40 minutes    Irine Seal, MD [facility]   To contact the attending provider between 7A-7P or the covering provider during after hours 7P-7A, please log into the web site [email or web address] and access using universal [place] password for that web site. If you do not have the password, please call the hospital operator.  07/19/2022, 3:21 PM

## 2022-07-19 NOTE — Progress Notes (Signed)
PT Cancellation Note  Patient Details Name: Caleb Barton MRN: [id number] DOB: [birth year]   Cancelled Treatment:    Reason Eval/Treat Not Completed: Medical issues which prohibited therapy Pt with increased pain with mobility and now has plan for surgery likely tomorrow.  Will check back on pt after surgery but will need new orders (per ortho).   Myrtis Hopping [place] 07/19/2022, 1:02 PM Arlyce Dice, DPT Physical Therapist Acute Rehabilitation Services Office: [phone]

## 2022-07-19 NOTE — Progress Notes (Signed)
Spoke with patient's wife, given limited OR availability and timing lat this evening, willl let patient eat now and plan for OR first thing tomorrow morning. Patient and wife in agreement. Diet order placed. NPO at midnight again.

## 2022-07-20 ENCOUNTER — Inpatient Hospital Stay (HOSPITAL_COMMUNITY): Payer: Medicare HMO | Admitting: Certified Registered Nurse Anesthetist

## 2022-07-20 ENCOUNTER — Inpatient Hospital Stay (HOSPITAL_COMMUNITY): Payer: Medicare HMO

## 2022-07-20 ENCOUNTER — Encounter (HOSPITAL_COMMUNITY)
Admission: EM | Disposition: A | Discharge status: Skilled Nursing Facility | Payer: Self-pay | Source: Home / Self Care | Attending: Internal Medicine

## 2022-07-20 DIAGNOSIS — I2510 Atherosclerotic heart disease of native coronary artery without angina pectoris: Secondary | ICD-10-CM

## 2022-07-20 DIAGNOSIS — I10 Essential (primary) hypertension: Secondary | ICD-10-CM

## 2022-07-20 DIAGNOSIS — T8453XA Infection and inflammatory reaction due to internal right knee prosthesis, initial encounter: Secondary | ICD-10-CM | POA: Diagnosis not present

## 2022-07-20 DIAGNOSIS — J189 Pneumonia, unspecified organism: Secondary | ICD-10-CM | POA: Diagnosis not present

## 2022-07-20 DIAGNOSIS — A400 Sepsis due to streptococcus, group A: Secondary | ICD-10-CM | POA: Diagnosis not present

## 2022-07-20 DIAGNOSIS — Z87891 Personal history of nicotine dependence: Secondary | ICD-10-CM | POA: Diagnosis not present

## 2022-07-20 DIAGNOSIS — R7881 Bacteremia: Secondary | ICD-10-CM | POA: Diagnosis not present

## 2022-07-20 DIAGNOSIS — M1711 Unilateral primary osteoarthritis, right knee: Secondary | ICD-10-CM | POA: Diagnosis not present

## 2022-07-20 HISTORY — PX: I & D KNEE WITH POLY EXCHANGE: SHX5024

## 2022-07-20 LAB — COMPREHENSIVE METABOLIC PANEL
ALT: 83 U/L — ABNORMAL HIGH (ref 0–44)
AST: 89 U/L — ABNORMAL HIGH (ref 15–41)
Albumin: 3 g/dL — ABNORMAL LOW (ref 3.5–5.0)
Alkaline Phosphatase: 92 U/L (ref 38–126)
Anion gap: 7 (ref 5–15)
BUN: 21 mg/dL (ref 8–23)
CO2: 24 mmol/L (ref 22–32)
Calcium: 8.3 mg/dL — ABNORMAL LOW (ref 8.9–10.3)
Chloride: 98 mmol/L (ref 98–111)
Creatinine, Ser: 0.69 mg/dL (ref 0.61–1.24)
GFR, Estimated: >60 mL/min (ref >60)
Glucose, Bld: 129 mg/dL — ABNORMAL HIGH (ref 70–99)
Potassium: 3.7 mmol/L (ref 3.5–5.1)
Sodium: 129 mmol/L — ABNORMAL LOW (ref 135–145)
Total Bilirubin: 2.8 mg/dL — ABNORMAL HIGH (ref 0.3–1.2)
Total Protein: 5.8 g/dL — ABNORMAL LOW (ref 6.5–8.1)

## 2022-07-20 LAB — CBC WITH DIFFERENTIAL/PLATELET
Abs Immature Granulocytes: 0.1 10*3/uL — ABNORMAL HIGH (ref 0.00–0.07)
Basophils Absolute: 0 10*3/uL (ref 0.0–0.1)
Basophils Relative: 0 %
Eosinophils Absolute: 0 10*3/uL (ref 0.0–0.5)
Eosinophils Relative: 0 %
HCT: 36.2 % — ABNORMAL LOW (ref 39.0–52.0)
Hemoglobin: 12.3 g/dL — ABNORMAL LOW (ref 13.0–17.0)
Immature Granulocytes: 1 %
Lymphocytes Relative: 6 %
Lymphs Abs: 0.6 10*3/uL — ABNORMAL LOW (ref 0.7–4.0)
MCH: 32.7 pg (ref 26.0–34.0)
MCHC: 34 g/dL (ref 30.0–36.0)
MCV: 96.3 fL (ref 80.0–100.0)
Monocytes Absolute: 0.7 10*3/uL (ref 0.1–1.0)
Monocytes Relative: 8 %
Neutro Abs: 8.1 10*3/uL — ABNORMAL HIGH (ref 1.7–7.7)
Neutrophils Relative %: 85 %
Platelets: 108 10*3/uL — ABNORMAL LOW (ref 150–400)
RBC: 3.76 MIL/uL — ABNORMAL LOW (ref 4.22–5.81)
RDW: 14 % (ref 11.5–15.5)
WBC: 9.5 10*3/uL (ref 4.0–10.5)
nRBC: 0 % (ref 0.0–0.2)

## 2022-07-20 LAB — MAGNESIUM: Magnesium: 2.3 mg/dL (ref 1.7–2.4)

## 2022-07-20 LAB — GLUCOSE, CAPILLARY
Glucose-Capillary: 119 mg/dL — ABNORMAL HIGH (ref 70–99)
Glucose-Capillary: 121 mg/dL — ABNORMAL HIGH (ref 70–99)

## 2022-07-20 LAB — PROTIME-INR
INR: 1.2 (ref 0.8–1.2)
Prothrombin Time: 15.4 seconds — ABNORMAL HIGH (ref 11.4–15.2)

## 2022-07-20 LAB — CK: Total CK: 979 U/L — ABNORMAL HIGH (ref 49–397)

## 2022-07-20 SURGERY — IRRIGATION AND DEBRIDEMENT KNEE WITH POLY EXCHANGE
Anesthesia: General | Site: Knee | Laterality: Right

## 2022-07-20 MED ORDER — ROCURONIUM BROMIDE 10 MG/ML (PF) SYRINGE
PREFILLED_SYRINGE | INTRAVENOUS | Status: DC | PRN
  Administered 2022-07-20: 80 mg via INTRAVENOUS

## 2022-07-20 MED ORDER — DEXAMETHASONE SODIUM PHOSPHATE 10 MG/ML IJ SOLN
INTRAMUSCULAR | Status: AC
  Filled 2022-07-20: qty 1

## 2022-07-20 MED ORDER — FENTANYL CITRATE (PF) 100 MCG/2ML IJ SOLN
INTRAMUSCULAR | Status: AC
  Filled 2022-07-20: qty 2

## 2022-07-20 MED ORDER — 0.9 % SODIUM CHLORIDE (POUR BTL) OPTIME
TOPICAL | Status: DC | PRN
  Administered 2022-07-20: 1000 mL

## 2022-07-20 MED ORDER — SUGAMMADEX SODIUM 500 MG/5ML IV SOLN
INTRAVENOUS | Status: DC | PRN
  Administered 2022-07-20: 300 mg via INTRAVENOUS

## 2022-07-20 MED ORDER — FENTANYL CITRATE (PF) 100 MCG/2ML IJ SOLN
INTRAMUSCULAR | Status: DC | PRN
  Administered 2022-07-20: 50 ug via INTRAVENOUS
  Administered 2022-07-20: 25 ug via INTRAVENOUS
  Administered 2022-07-20: 50 ug via INTRAVENOUS
  Administered 2022-07-20: 25 ug via INTRAVENOUS

## 2022-07-20 MED ORDER — ACETAMINOPHEN 10 MG/ML IV SOLN
INTRAVENOUS | Status: AC
  Filled 2022-07-20: qty 100

## 2022-07-20 MED ORDER — OXYCODONE HCL 5 MG/5ML PO SOLN: 5.0000 mg | Freq: Once | ORAL | Status: DC | PRN

## 2022-07-20 MED ORDER — ACETAMINOPHEN 10 MG/ML IV SOLN: 1000.0000 mg | Freq: Once | INTRAVENOUS | Status: DC | PRN

## 2022-07-20 MED ORDER — PROPOFOL 10 MG/ML IV BOLUS
INTRAVENOUS | Status: AC
  Filled 2022-07-20: qty 20

## 2022-07-20 MED ORDER — VANCOMYCIN HCL 1000 MG IV SOLR
INTRAVENOUS | Status: AC
  Filled 2022-07-20: qty 20

## 2022-07-20 MED ORDER — ISOPROPYL ALCOHOL 70 % SOLN
Status: AC
  Filled 2022-07-20: qty 480

## 2022-07-20 MED ORDER — ACETAMINOPHEN 10 MG/ML IV SOLN
INTRAVENOUS | Status: DC | PRN
  Administered 2022-07-20: 1000 mg via INTRAVENOUS

## 2022-07-20 MED ORDER — PHENYLEPHRINE HCL (PRESSORS) 10 MG/ML IV SOLN
INTRAVENOUS | Status: AC
  Filled 2022-07-20: qty 1

## 2022-07-20 MED ORDER — SODIUM CHLORIDE 0.9 % IR SOLN
Status: DC | PRN
  Administered 2022-07-20: 6000 mL

## 2022-07-20 MED ORDER — FUROSEMIDE 10 MG/ML IJ SOLN
20.0000 mg | Freq: Two times a day (BID) | INTRAMUSCULAR | Status: AC
  Administered 2022-07-20 – 2022-07-21 (×2): 20 mg via INTRAVENOUS
  Filled 2022-07-20 (×2): qty 2

## 2022-07-20 MED ORDER — TRANEXAMIC ACID-NACL 1000-0.7 MG/100ML-% IV SOLN
INTRAVENOUS | Status: AC
  Filled 2022-07-20: qty 100

## 2022-07-20 MED ORDER — LIDOCAINE 2% (20 MG/ML) 5 ML SYRINGE
INTRAMUSCULAR | Status: DC | PRN
  Administered 2022-07-20: 60 mg via INTRAVENOUS

## 2022-07-20 MED ORDER — PHENYLEPHRINE HCL-NACL 20-0.9 MG/250ML-% IV SOLN
INTRAVENOUS | Status: DC | PRN
  Administered 2022-07-20: 40 ug/min via INTRAVENOUS

## 2022-07-20 MED ORDER — RIVAROXABAN 10 MG PO TABS
10.0000 mg | ORAL_TABLET | Freq: Every day | ORAL | Status: AC
  Administered 2022-07-21 – 2022-07-22 (×2): 10 mg via ORAL
  Filled 2022-07-20 (×2): qty 1

## 2022-07-20 MED ORDER — ONDANSETRON HCL 4 MG/2ML IJ SOLN
INTRAMUSCULAR | Status: AC
  Filled 2022-07-20: qty 2

## 2022-07-20 MED ORDER — RIVAROXABAN 20 MG PO TABS
20.0000 mg | ORAL_TABLET | Freq: Every day | ORAL | Status: DC
  Administered 2022-07-23 – 2022-07-25 (×3): 20 mg via ORAL
  Filled 2022-07-20 (×3): qty 1

## 2022-07-20 MED ORDER — DEXAMETHASONE SODIUM PHOSPHATE 10 MG/ML IJ SOLN
INTRAMUSCULAR | Status: DC | PRN
  Administered 2022-07-20: 8 mg via INTRAVENOUS

## 2022-07-20 MED ORDER — VANCOMYCIN HCL 1000 MG IV SOLR
INTRAVENOUS | Status: DC | PRN
  Administered 2022-07-20: 1000 mg

## 2022-07-20 MED ORDER — PROPOFOL 10 MG/ML IV BOLUS
INTRAVENOUS | Status: DC | PRN
  Administered 2022-07-20: 100 mg via INTRAVENOUS

## 2022-07-20 MED ORDER — LIDOCAINE HCL (PF) 2 % IJ SOLN
INTRAMUSCULAR | Status: AC
  Filled 2022-07-20: qty 5

## 2022-07-20 MED ORDER — PHENYLEPHRINE 80 MCG/ML (10ML) SYRINGE FOR IV PUSH (FOR BLOOD PRESSURE SUPPORT)
PREFILLED_SYRINGE | INTRAVENOUS | Status: AC
  Filled 2022-07-20: qty 10

## 2022-07-20 MED ORDER — PHENYLEPHRINE 80 MCG/ML (10ML) SYRINGE FOR IV PUSH (FOR BLOOD PRESSURE SUPPORT)
PREFILLED_SYRINGE | INTRAVENOUS | Status: DC | PRN
  Administered 2022-07-20 (×4): 160 ug via INTRAVENOUS

## 2022-07-20 MED ORDER — FENTANYL CITRATE PF 50 MCG/ML IJ SOSY: 25.0000 ug | PREFILLED_SYRINGE | INTRAMUSCULAR | Status: DC | PRN

## 2022-07-20 MED ORDER — TRANEXAMIC ACID-NACL 1000-0.7 MG/100ML-% IV SOLN
1000.0000 mg | INTRAVENOUS | Status: AC
  Administered 2022-07-20: 1000 mg via INTRAVENOUS

## 2022-07-20 MED ORDER — OXYCODONE HCL 5 MG PO TABS: 5.0000 mg | ORAL_TABLET | Freq: Once | ORAL | Status: DC | PRN

## 2022-07-20 MED ORDER — SUGAMMADEX SODIUM 500 MG/5ML IV SOLN
INTRAVENOUS | Status: AC
  Filled 2022-07-20: qty 5

## 2022-07-20 MED ORDER — ORAL CARE MOUTH RINSE: 15.0000 mL | OROMUCOSAL | Status: DC | PRN

## 2022-07-20 MED ORDER — ACETAMINOPHEN 500 MG PO TABS: 1000.0000 mg | ORAL_TABLET | Freq: Once | ORAL | Status: DC | PRN

## 2022-07-20 MED ORDER — ONDANSETRON HCL 4 MG/2ML IJ SOLN
INTRAMUSCULAR | Status: DC | PRN
  Administered 2022-07-20: 4 mg via INTRAVENOUS

## 2022-07-20 MED ORDER — ACETAMINOPHEN 160 MG/5ML PO SOLN: 1000.0000 mg | Freq: Once | ORAL | Status: DC | PRN

## 2022-07-20 MED ORDER — IRRISEPT - 450ML BOTTLE WITH 0.05% CHG IN STERILE WATER, USP 99.95% OPTIME
TOPICAL | Status: DC | PRN
  Administered 2022-07-20: 450 mL

## 2022-07-20 SURGICAL SUPPLY — 61 items
BAG COUNTER SPONGE SURGICOUNT (BAG) ×1 IMPLANT
BLADE SAGITTAL 25.0X1.37X90 (BLADE) IMPLANT
BLADE SURG 15 STRL LF DISP TIS (BLADE) ×1 IMPLANT
BLADE SURG 15 STRL SS (BLADE) ×1
BLADE SURG SZ10 CARB STEEL (BLADE) ×2 IMPLANT
BNDG CONFORM 6X.82 1P STRL (GAUZE/BANDAGES/DRESSINGS) IMPLANT
BNDG ELASTIC 6INX 5YD STR LF (GAUZE/BANDAGES/DRESSINGS) IMPLANT
BNDG ELASTIC 6X10 VLCR STRL LF (GAUZE/BANDAGES/DRESSINGS) ×1 IMPLANT
CHLORAPREP W/TINT 26 (MISCELLANEOUS) ×1 IMPLANT
CLSR STERI-STRIP ANTIMIC 1/2X4 (GAUZE/BANDAGES/DRESSINGS) ×1 IMPLANT
CNTNR URN SCR LID CUP LEK RST (MISCELLANEOUS) ×3 IMPLANT
CONT SPEC 4OZ STRL OR WHT (MISCELLANEOUS) ×3
COVER SURGICAL LIGHT HANDLE (MISCELLANEOUS) ×1 IMPLANT
CUFF TOURN SGL QUICK 24 (TOURNIQUET CUFF)
CUFF TOURN SGL QUICK 30 (TOURNIQUET CUFF) ×1
CUFF TRNQT CYL 24X4X16.5-23 (TOURNIQUET CUFF) IMPLANT
CUFF TRNQT CYL 30X4X21-28X (TOURNIQUET CUFF) IMPLANT
DRAPE INCISE IOBAN 66X45 STRL (DRAPES) ×2 IMPLANT
DRAPE SHEET LG 3/4 BI-LAMINATE (DRAPES) ×3 IMPLANT
DRAPE U-SHAPE 47X51 STRL (DRAPES) ×1 IMPLANT
DRESSING PREVENA PLUS CUSTOM (GAUZE/BANDAGES/DRESSINGS) IMPLANT
DRSG MEPILEX POST OP 4X12 (GAUZE/BANDAGES/DRESSINGS) ×1 IMPLANT
DRSG PREVENA PLUS CUSTOM (GAUZE/BANDAGES/DRESSINGS) ×1
ELECT BLADE TIP CTD 4 INCH (ELECTRODE) IMPLANT
EVACUATOR 1/8 PVC DRAIN (DRAIN) ×1 IMPLANT
GLOVE BIO SURGEON STRL SZ 6.5 (GLOVE) IMPLANT
GLOVE BIO SURGEON STRL SZ7.5 (GLOVE) ×2 IMPLANT
GLOVE BIO SURGEON STRL SZ8 (GLOVE) IMPLANT
GLOVE BIOGEL PI IND STRL 6.5 (GLOVE) IMPLANT
GLOVE BIOGEL PI IND STRL 7.5 (GLOVE) ×1 IMPLANT
GLOVE BIOGEL PI IND STRL 8 (GLOVE) ×1 IMPLANT
GLOVE PI ORTHO PRO STRL SZ8 (GLOVE) ×2 IMPLANT
GOWN STRL REUS W/ TWL LRG LVL3 (GOWN DISPOSABLE) ×1 IMPLANT
GOWN STRL REUS W/TWL LRG LVL3 (GOWN DISPOSABLE) ×1
GOWN STRL REUS W/TWL XL LVL3 (GOWN DISPOSABLE) IMPLANT
GOWN STRL SURGICAL XL XLNG (GOWN DISPOSABLE) ×1 IMPLANT
HANDPIECE INTERPULSE COAX TIP (DISPOSABLE) ×1
HOLDER FOLEY CATH W/STRAP (MISCELLANEOUS) ×1 IMPLANT
IMMOBILIZER KNEE 22 UNIV (SOFTGOODS) ×1 IMPLANT
INSERT PFC SIG STB SZ3 15.0MM (Knees) IMPLANT
KIT DRSG PREVENA PLUS 7DAY 125 (MISCELLANEOUS) IMPLANT
KIT TURNOVER KIT A (KITS) IMPLANT
MANIFOLD NEPTUNE II (INSTRUMENTS) ×1 IMPLANT
MARKER SKIN DUAL TIP RULER LAB (MISCELLANEOUS) ×1 IMPLANT
PACK TOTAL KNEE CUSTOM (KITS) ×1 IMPLANT
PROTECTOR NERVE ULNAR (MISCELLANEOUS) ×1 IMPLANT
SET HNDPC FAN SPRY TIP SCT (DISPOSABLE) ×1 IMPLANT
SPIKE FLUID TRANSFER (MISCELLANEOUS) ×1 IMPLANT
SUT ETHILON 3 0 FSL (SUTURE) ×2 IMPLANT
SUT MON AB 2-0 CT1 36 (SUTURE) IMPLANT
SUT PDS AB 0 CT 36 (SUTURE) ×1 IMPLANT
SUT PDS AB 2-0 CT2 27 (SUTURE) ×2 IMPLANT
SUT STRATAFIX 0 PDS 27 VIOLET (SUTURE) ×1
SUT STRATAFIX 1PDS 45CM VIOLET (SUTURE) IMPLANT
SUT STRATAFIX PDO 1 14 VIOLET (SUTURE) ×1
SUT STRATFX PDO 1 14 VIOLET (SUTURE) ×1
SUTURE STRATFX 0 PDS 27 VIOLET (SUTURE) ×1 IMPLANT
SUTURE STRATFX PDO 1 14 VIOLET (SUTURE) ×1 IMPLANT
TOOTHBRUSH ADULT (PERSONAL CARE ITEMS) ×1 IMPLANT
TUBE SUCTION HIGH CAP CLEAR NV (SUCTIONS) ×1 IMPLANT
WRAP KNEE MAXI GEL POST OP (GAUZE/BANDAGES/DRESSINGS) ×1 IMPLANT

## 2022-07-20 NOTE — Transfer of Care (Signed)
Immediate Anesthesia Transfer of Care Note  Patient: Caleb Barton  Procedure(s) Performed: IRRIGATION AND DEBRIDEMENT RIGHT KNEE WITH POLY EXCHANGE (Right: Knee)  Patient Location: PACU  Anesthesia Type:General  Level of Consciousness: awake, alert , and patient cooperative  Airway & Oxygen Therapy: Patient Spontanous Breathing and Patient connected to face mask oxygen  Post-op Assessment: Report given to RN and Post -op Vital signs reviewed and stable  Post vital signs: Reviewed and stable  Last Vitals:  Vitals Value Taken Time  BP 140/92 07/20/22 1116  Temp    Pulse 91 07/20/22 1127  Resp 22 07/20/22 1125  SpO2 97 % 07/20/22 1127  Vitals shown include unvalidated device data.  Last Pain:  Vitals:   07/20/22 0817  TempSrc:   PainSc: 0-No pain      Patients Stated Pain Goal: 3 ([id number])  Complications: No notable events documented.

## 2022-07-20 NOTE — Anesthesia Postprocedure Evaluation (Signed)
Anesthesia Post Note  Patient: Caleb Barton  Procedure(s) Performed: IRRIGATION AND DEBRIDEMENT RIGHT KNEE WITH POLY EXCHANGE (Right: Knee)     Patient location during evaluation: PACU Anesthesia Type: General Level of consciousness: awake and alert Pain management: pain level controlled Vital Signs Assessment: post-procedure vital signs reviewed and stable Respiratory status: spontaneous breathing, nonlabored ventilation, respiratory function stable and patient connected to nasal cannula oxygen Cardiovascular status: blood pressure returned to baseline and stable Postop Assessment: no apparent nausea or vomiting Anesthetic complications: no   No notable events documented.  Last Vitals:  Vitals:   07/20/22 1130 07/20/22 1145  BP: 138/71 (!) 143/82  Pulse: 84 82  Resp: 20 18  Temp:    SpO2: 95% 93%    Last Pain:  Vitals:   07/20/22 1145  TempSrc:   PainSc: 0-No pain                 Massie Cogliano

## 2022-07-20 NOTE — Progress Notes (Signed)
[facility] for Infectious Disease    Date of Admission:  07/17/2022   Total days of antibiotics 4   ID: Caleb Barton is a 82 y.o. male with  bacteremia and left knee pji Principal Problem:   Sepsis due to group A Streptococcus without acute organ dysfunction ([place]) Active Problems:   HTN (hypertension)   Stroke [facility])   Sleep apnea   Right knee DJD   Hypercholesteremia   Malignant neoplasm of prostate (HCC)   Pneumonia   Lower extremity weakness   Lower back pain   Paroxysmal A-fib (HCC)   Streptococcal bacteremia   Knee pain, right   Prosthetic joint infection (HCC)    Subjective: Afebrile. Underwent I x D and 1 component revision poly exchange this morning of right knee TKA  OR cx sent. Still feeling rough from right knee pain   ROS: + cough - " I think it is from my BP meds"   Medications:   vitamin C  1,000 mg Oral Daily   Chlorhexidine Gluconate Cloth  6 each Topical Q0600   dorzolamide-timolol  1 drop Both Eyes BID   furosemide  20 mg Intravenous Q12H   latanoprost  1 drop Both Eyes QHS   losartan  50 mg Oral QHS   multivitamin with minerals  1 tablet Oral Daily   [START ON 07/21/2022] rivaroxaban  10 mg Oral Daily   [START ON 07/23/2022] rivaroxaban  20 mg Oral Daily   sodium chloride flush  3 mL Intravenous Q12H   tamsulosin  0.4 mg Oral Daily    Objective: Vital signs in last 24 hours: Temp:  [97.7 F (36.5 C)-98.4 F (36.9 C)] 97.7 F (36.5 C) (03/31 1300) Pulse Rate:  [82-88] 82 (03/31 1145) Resp:  [18-21] 18 (03/31 1145) BP: (129-153)/(71-92) 143/82 (03/31 1145) SpO2:  [93 %-96 %] 93 % (03/31 1145) Weight:  [116.5 kg] 116.5 kg (03/31 0500)  Physical Exam  Constitutional: He is oriented to person, place, and time. He appears well-developed and well-nourished. No distress.  HENT:  Mouth/Throat: Oropharynx is clear and moist. No oropharyngeal exudate.  Cardiovascular: Normal rate, regular rhythm and normal heart sounds. Exam reveals no gallop  and no friction rub.  No murmur heard.  Pulmonary/Chest: Effort normal and breath sounds normal. No respiratory distress. He has no wheezes.  Abdominal: Soft. Bowel sounds are normal. He exhibits no distension. There is no tenderness.  [id number] knee wrapped Neurological: He is alert and oriented to person, place, and time.  Skin: Skin is warm and dry. No rash noted. No erythema.  Psychiatric: He has a normal mood and affect. His behavior is normal.    Lab Results Recent Labs    07/19/22 0451 07/20/22 0409  WBC 11.8* 9.5  HGB 12.6* 12.3*  HCT 38.2* 36.2*  NA 131* 129*  K 3.7 3.7  CL 99 98  CO2 24 24  BUN 21 21  CREATININE 0.76 0.69   Liver Panel Recent Labs    07/19/22 0451 07/20/22 0409  PROT 5.8* 5.8*  ALBUMIN 3.3* 3.0*  AST 111* 89*  ALT 87* 83*  ALKPHOS 66 92  BILITOT 3.4* 2.8*   Sedimentation Rate No results for input(s): "ESRSEDRATE" in the last 72 hours. C-Reactive Protein No results for input(s): "CRP" in the last 72 hours.  Microbiology: 3/30 blood cx ngtd Studies/Results: DG Knee Right Port  Result Date: 07/20/2022 CLINICAL DATA:  Postop. EXAM: PORTABLE RIGHT KNEE - 1-2 VIEW COMPARISON:  07/18/2022 FINDINGS: Right knee  arthroplasty in expected alignment. No periprosthetic lucency or fracture. Prior patellar resurfacing. Postsurgical air in the subcutaneous tissues. Diminished knee joint effusion with air in the joint space. Wound VAC overlies the anterior knee. IMPRESSION: Diminished knee joint effusion postop.  Total arthroplasty in place. Electronically Signed   By: Keith Rake M.D.   On: 07/20/2022 12:11   ECHOCARDIOGRAM COMPLETE  Result Date: 07/18/2022    ECHOCARDIOGRAM REPORT   Patient Name:   Caleb Barton Date of Exam: 07/18/2022 Medical Rec #:  [id number]      Height:       69.0 in Accession #:    [id number]     Weight:       242.0 lb Date of Birth:  1940/07/03       BSA:          2.240 m Patient Age:    47 years       BP:           107/69 mmHg  Patient Gender: M              HR:           103 bpm. Exam Location:  Inpatient Procedure: 2D Echo, Color Doppler and Cardiac Doppler Indications:    Bacteremia  History:        Patient has prior history of Echocardiogram examinations, most                 recent 06/30/2019. Stroke, Arrythmias:RBBB,                 Signs/Symptoms:Dyspnea; Risk Factors:Sleep Apnea and                 Hypertension. Cancer.  Sonographer:    Eartha Inch Referring Phys: [id number] DANIEL V THOMPSON  Sonographer Comments: Technically difficult study due to poor echo windows. Image acquisition challenging due to patient body habitus and Image acquisition challenging due to respiratory motion. Patient unable to be positioned for optimal imaging. IMPRESSIONS  1. Left ventricular ejection fraction, by estimation, is 60 to 65%. The left ventricle has normal function. The left ventricle has no regional wall motion abnormalities. Left ventricular diastolic parameters are indeterminate.  2. Right ventricular systolic function is normal. The right ventricular size is normal. Tricuspid regurgitation signal is inadequate for assessing PA pressure.  3. No evidence of mitral valve regurgitation.  4. The aortic valve was not well visualized. Aortic valve regurgitation is not visualized.  5. Aneurysm of the aortic root, measuring 42 mm.  6. The inferior vena cava is dilated in size with <50% respiratory variability, suggesting right atrial pressure of 15 mmHg. Conclusion(s)/Recommendation(s): No large vegetations seen visualized. aortic valve not well visualized. FINDINGS  Left Ventricle: Left ventricular ejection fraction, by estimation, is 60 to 65%. The left ventricle has normal function. The left ventricle has no regional wall motion abnormalities. The left ventricular internal cavity size was normal in size. There is  no left ventricular hypertrophy. Left ventricular diastolic parameters are indeterminate. Right Ventricle: The right ventricular size  is normal. Right ventricular systolic function is normal. Tricuspid regurgitation signal is inadequate for assessing PA pressure. Left Atrium: Left atrial size was normal in size. Right Atrium: Right atrial size was normal in size. Pericardium: There is no evidence of pericardial effusion. Mitral Valve: No evidence of mitral valve regurgitation. MV peak gradient, 7.7 mmHg. The mean mitral valve gradient is 4.0 mmHg. Tricuspid Valve: Tricuspid valve regurgitation is not demonstrated. Aortic Valve: The aortic  valve was not well visualized. Aortic valve regurgitation is not visualized. Pulmonic Valve: Pulmonic valve regurgitation is not visualized. Aorta: There is an aneurysm involving the aortic root measuring 42 mm. Venous: The inferior vena cava is dilated in size with less than 50% respiratory variability, suggesting right atrial pressure of 15 mmHg. IAS/Shunts: The interatrial septum was not well visualized.  LEFT VENTRICLE PLAX 2D LVIDd:         5.70 cm      Diastology LVIDs:         4.50 cm      LV e' medial:    5.87 cm/s LV PW:         0.90 cm      LV E/e' medial:  22.0 LV IVS:        0.80 cm      LV e' lateral:   9.57 cm/s LVOT diam:     2.30 cm      LV E/e' lateral: 13.5 LV SV:         73 LV SV Index:   33 LVOT Area:     4.15 cm  LV Volumes (MOD) LV vol d, MOD A2C: 104.0 ml LV vol d, MOD A4C: 63.5 ml LV vol s, MOD A2C: 54.0 ml LV vol s, MOD A4C: 38.3 ml LV SV MOD A2C:     50.0 ml LV SV MOD A4C:     63.5 ml LV SV MOD BP:      37.3 ml RIGHT VENTRICLE            IVC RV S prime:     9.57 cm/s  IVC diam: 2.50 cm TAPSE (M-mode): 0.9 cm LEFT ATRIUM             Index        RIGHT ATRIUM           Index LA diam:        3.80 cm 1.70 cm/m   RA Area:     21.30 cm LA Vol (A2C):   75.7 ml 33.79 ml/m  RA Volume:   56.10 ml  25.04 ml/m LA Vol (A4C):   58.6 ml 26.16 ml/m LA Biplane Vol: 72.2 ml 32.23 ml/m  AORTIC VALVE LVOT Vmax:   103.00 cm/s LVOT Vmean:  80.900 cm/s LVOT VTI:    0.176 m  AORTA Ao Root diam: 4.20 cm  Ao Asc diam:  4.40 cm MITRAL VALVE MV Area (PHT): 5.16 cm     SHUNTS MV Area VTI:   3.18 cm     Systemic VTI:  0.18 m MV Peak grad:  7.7 mmHg     Systemic Diam: 2.30 cm MV Mean grad:  4.0 mmHg MV Vmax:       1.39 m/s MV Vmean:      93.7 cm/s MV Decel Time: 147 msec MV E velocity: 129.00 cm/s Placido Sou signed by Phineas Inches Signature Date/Time: 07/18/2022/4:37:12 PM    Final      Assessment/Plan: 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 If blood cx from 3/30 still remain negative on 4/1, would proceed with getting picc line to treat with 6 wk of IV continuous penicillin.  Will follow up on OR cultures to see if any other pathogen identified but that would be unusual.   Chronic cough = defer to primary to review HTN meds to see if any offending agent.   [facility] for Infectious Diseases Pager:  [phone]  07/20/2022, 3:14 PM

## 2022-07-20 NOTE — Anesthesia Procedure Notes (Signed)
Procedure Name: Intubation Date/Time: 07/20/2022 8:57 AM  Performed by: West Pugh, CRNAPre-anesthesia Checklist: Patient identified, Emergency Drugs available, Suction available, Patient being monitored and Timeout performed Patient Re-evaluated:Patient Re-evaluated prior to induction Oxygen Delivery Method: Circle system utilized Preoxygenation: Pre-oxygenation with 100% oxygen Induction Type: IV induction Ventilation: Oral airway inserted - appropriate to patient size and Mask ventilation with difficulty Laryngoscope Size: Mac and 4 Grade View: Grade II Tube type: Oral Tube size: 7.5 mm Number of attempts: 1 Airway Equipment and Method: Stylet Placement Confirmation: ETT inserted through vocal cords under direct vision, positive ETCO2, CO2 detector and breath sounds checked- equal and bilateral Secured at: 23 cm Tube secured with: Tape Dental Injury: Teeth and Oropharynx as per pre-operative assessment

## 2022-07-20 NOTE — Op Note (Signed)
DATE OF SURGERY:  07/20/2022 TIME: 11:00 AM  PATIENT NAME:  Caleb Barton   AGE: 82 y.o.    PRE-OPERATIVE DIAGNOSIS: Right knee acute prosthetic joint infection  POST-OPERATIVE DIAGNOSIS:  Same  PROCEDURE: Right knee 1 component revision poly exchange with irrigation and debridement    SURGEON:  Amadu Schlageter A Leonore Frankson, MD   ASSISTANT: Dorise Bullion, PA-C, present and scrubbed throughout the case, critical for assistance with exposure, retraction, instrumentation, and closure.   OPERATIVE IMPLANTS:  DePuy Sigma 15 mm RP poly for a size 3 Implant Name Type Inv. Item Serial No. Manufacturer Lot No. LRB No. Used Action  INSERT PFC SIG STB SZ3 15.0MM - [id number] Knees INSERT PFC SIG STB SZ3 15.0MM  [employer]  Right 1 Implanted      PREOPERATIVE INDICATIONS:  Caleb Barton is a 82 y.o. year old male who had right total knee arthroplasty done with Dr. Noemi Chapel in 2013.  He had done well after surgery but unfortunately over the past week has developed severely worsening pain and disability.  Unable to weight-bear or range the knee because of pain also had left knee replacement done 1 year ago but not having issues with this side.  Denies any recent illnesses or infections did have lumbar ablation but reports that it was noninvasive.  In the hospital he was found to be bacteremic with group A strep.  Aspiration of the knee only yielded 100 white blood cells but positive GPC's on the Gram stain, as well as concerning clinical presentation recommended plan for irrigation debridement with liner exchange to hopefully clear the prosthetic joint infection.  Discussed if there is failure to clear the infection or recurrence of infection would ultimately need a full explant of his knee prosthesis.  The risks, benefits, and alternatives were discussed at length including but not limited to the risks of infection, bleeding, nerve injury, stiffness, blood clots, the need for revision surgery,  cardiopulmonary complications, among others, and they were willing to proceed.  OPERATIVE FINDINGS AND UNIQUE ASPECTS OF THE CASE: Well-fixed total knee arthroplasty components frank purulence in the joint.  Stable with the 15 mm insert.  Complete synovectomy and debridement of the knee joint performed.  Exchange of the polyethylene liner with another 15 mm insert.   ESTIMATED BLOOD LOSS: 50cc  OPERATIVE DESCRIPTION:  Once adequate anesthesia was induced, the patient was positioned supine with a right thigh tourniquet placed.  Patient had just completed his antibiotics this morning.  He is on scheduled penicillin. the right lower extremity was prepped and draped in sterile fashion.  A time-  out was performed identifying the patient, planned procedure, and the appropriate extremity.     The leg was elevated but not exsanguinated, tourniquet elevated to 250 mmHg.  A midline incision was  made followed by median parapatellar arthrotomy through the old scar.  The old Ethibond was visualized and removed.. A medial release was performed, the infrapatellar fat pad was resected with care taken to protect the patellar tendon.  Complete circumferential synovectomy was performed.  Synovial tissue was sent for 3 culture specimens for aerobic anaerobic culture.  Following initial  exposure, the old polyethylene insert was removed.  The posterior capsule was superficially excised.  Felt at this point we had adequate circumferential debridement of all visible synovial and purulent tissue.    Irrigation of the knee joint was performed with a combination of dilute hydrogen peroxide, 6 L normal saline pulse lavage, and Irrisept.    Once  the knee was thoroughly irrigated we changed gloves and switched to clean instruments.  The new polyethylene insert was then inserted back into the knee.  1 g of vancomycin powder was also placed into the knee joint.        The tourniquet had been let down at 60 minutes.  No  significant hemostasis was required.  The medial parapatellar arthrotomy was then reapproximated using  #1 Stratafix sutures with the knee  in flexion.  The remaining wound was closed with 0 stratafix, 2-0 monocryl, and3-0 nylon.  Incisional Prevena wound VAC was applied..  The patient was then brought to recovery room in stable condition, tolerating the procedure  well. There were no complications.   Post op recs: WB: WBAT Abx: Continue scheduled antibiotics per infection disease for treatment of group A strep.  Follow-up IntraOp cultures. Imaging: PACU xrays DVT prophylaxis: Xarelto 10 mg postop day 1-2, resume full dose Xarelto postop day 3 Follow up: 2 weeks after surgery for a wound check with Dr. Zachery Dakins at [facility].  Address: [address], [place], [place] [address]  Office Phone: [phone]  Charlies Constable, MD Orthopaedic Surgery

## 2022-07-20 NOTE — Progress Notes (Signed)
This RN came in to check on patient, patient had taken off his CPAP and was holding it in his hands. Sats were fluctuating between 85-92% on room air. This RN asked the patient why he took his CPAP off and he stated that he wanted to kill 2 carabao. This RN put the CPAP back on and patient's sat's were fluctuating between 85-95% with the CPAP on. RT called and and the pressure setting on the CPAP was changed. Patient's sat's then were maintained between 93-97%. Patient also noted at the time to be intermittently disoriented to situation, but he could answer all orientation questions. Since settings have been changed on the CPAP he is again oriented x4. [id number]. On call provider notified.

## 2022-07-20 NOTE — Progress Notes (Signed)
RT note: In to see pt. per RN request for frequent desaturations, pulse ox probe site changed along with cable, placed flowmeter to CPAP from 2l up to 4l, along with increasing set CPAP Auto settings from <4/>20 to <8/>16 with Oxygen sats currently reading 93-94%, RN aware to notify anytime if needed.

## 2022-07-20 NOTE — Progress Notes (Signed)
     Subjective: Patient continues have severe pain and swelling in the right knee.  Denies pain in other joints or extremities.  Denies distal numbness and tingling.  Agreeable to plan for surgery today.  Wife at bedside.  Objective:   VITALS:   Vitals:   07/19/22 2100 07/20/22 0030 07/20/22 0445 07/20/22 0500  BP: 129/78  (!) 153/87   Pulse:   87   Resp:      Temp:  98.3 F (36.8 C) 98.4 F (36.9 C)   TempSrc:  Oral Oral   SpO2:      Weight:    116.5 kg  Height:        Sensation intact distally Intact pulses distally Dorsiflexion/Plantar flexion intact Incision: Well-healed incision from prior knee replacement Compartment soft Significant tenderness about the right knee large effusion.  Nontender about the left knee, no appreciable effusion in the left knee.  Tolerates range of motion of the left lower extremity without significant discomfort.  Lab Results  Component Value Date   WBC 9.5 07/20/2022   HGB 12.3 (L) 07/20/2022   HCT 36.2 (L) 07/20/2022   MCV 96.3 07/20/2022   PLT 108 (L) 07/20/2022   BMET    Component Value Date/Time   NA 129 (L) 07/20/2022 0409   NA 143 07/27/2019 1513   K 3.7 07/20/2022 0409   CL 98 07/20/2022 0409   CO2 24 07/20/2022 0409   GLUCOSE 129 (H) 07/20/2022 0409   BUN 21 07/20/2022 0409   BUN 11 07/27/2019 1513   CREATININE 0.69 07/20/2022 0409   CALCIUM 8.3 (L) 07/20/2022 0409   GFRNONAA >60 07/20/2022 0409      Xray: X-rays right knee demonstrate no fracture description of the rash is a normalities  Assessment/Plan: Day of Surgery   Principal Problem:   Sepsis due to group A Streptococcus without acute organ dysfunction (HCC) Active Problems:   HTN (hypertension)   Stroke (HCC)   Sleep apnea   Right knee DJD   Hypercholesteremia   Malignant neoplasm of prostate (HCC)   Pneumonia   Lower extremity weakness   Lower back pain   Paroxysmal A-fib (HCC)   Streptococcal bacteremia   Knee pain, right   Prosthetic  joint infection (HCC)  Right knee arthroplasty done by Dr. Noemi Chapel 10 years ago concerning for acute prosthetic joint infection  Discussed with patient lateral cell count from the aspiration was low the Gram stain shows gram-positive cocci.  Given this finding and clinical picture concerning for acute septic joint arthritis.  Discussed surgical options including irrigation debridement with liner exchange versus removal of his knee replacement and two-stage antibiotic spacer.  Given acute presentation would recommend an I&D liner exchange at this point.  Risk and benefits including recurrence of infection, pain, swelling, blood loss, nerve injury, fracture were discussed.  Patient agreeable to proceed with surgery today.  INR down to 1.2 today.   Prospero Mahnke A Jacai Kipp 07/20/2022, 8:04 AM   Charlies Constable, MD  Contact information:   [phone] 7am-5pm epic message Dr. Zachery Dakins, or call office for patient follow up: [phone] After hours and holidays please check [email or web address] for group call information for [facility]

## 2022-07-20 NOTE — Progress Notes (Addendum)
PROGRESS NOTE    Caleb Barton  [id number] DOB: 1940/09/09 DOA: 07/17/2022 PCP: Glenis Smoker, MD    Chief Complaint  Patient presents with   Extremity Weakness    Brief Narrative:  Patient 82 year old gentleman history of paroxysmal atrial fibrillation on anticoagulation, hypertension, hyperlipidemia, OSA on CPAP, remote history of thalamic CVA, BPH, history of prostate cancer status postradiation treatment presented to the ED with sudden onset lower extremity weakness, worsening lower back pain.  Patient's status post ablation of nerves in the lower back by neurosurgery approximately 2 weeks prior to admission.  Patient presented sudden onset lower extremity weakness, noted to be on the ground for approximately 3 hours prior to getting help and EMS being called and brought to the ED.  Patient seen in the ED MRI of the L-spine done, MRI reviewed by neurosurgery who does not feel any surgical intervention needed at this time and does not feel patient's weakness secondary to lumbar spine.  Patient noted to spike a fever in the ED, chest x-ray done concerning for bilateral lower lobe pneumonia.  Patient pancultured blood cultures positive for Streptococcus pyogenes.  Patient placed empirically on IV antibiotics.  Patient also with complaints of right knee pain, plain films of the right knee done concerning for knee effusion.  ID, orthopedics consulted.  IV antibiotics adjusted per ID.   Assessment & Plan:   Principal Problem:   Sepsis due to group A Streptococcus without acute organ dysfunction (HCC) Active Problems:   Streptococcal bacteremia   HTN (hypertension)   Stroke [facility])   Sleep apnea   Right knee DJD   Hypercholesteremia   Malignant neoplasm of prostate (HCC)   Pneumonia   Lower extremity weakness   Lower back pain   Paroxysmal A-fib (HCC)   Knee pain, right   Prosthetic joint infection (HCC)  #1 sepsis secondary to group A strep/Streptococcus pyogenes  bacteremia -Patient presented with generalized weakness, noted to have a fever in the ED, patient pancultured initial chest x-ray concerning for bibasilar pneumonia. -Blood cultures positive for Streptococcus pyogenes. -Unclear source. -MRI of the L-spine done on admission did not show any obvious evidence of abscess formation discitis. -Patient also noted with right prosthetic knee pain, plain films of the right knee done concerning for joint effusion. -Concern patient may have seeded to this joint, orthopedics consulted for possible aspiration and further evaluation. -See problem #2. -2D echo ordered with normal EF, no significant vegetations noted.   -??  TEE, will defer to ID.  -Repeat blood cultures done with no growth to date. -ID consulted who have assessed the patient, IV antibiotics adjusted and patient now on penicillin G.   Was on linezolid which was discontinued per ID on 07/19/2022. -Per ID. [name]  2.  Right prosthetic knee infection -Patient with complaints of sudden onset right knee pain with difficulty bending his joint since admission. -Plain films done concerning for moderate to large joint effusion. -Orthopedics consulted and patient subsequently underwent right knee aspiration with preliminary cultures with rare gram-positive cocci in chains.   -Due to concern for prosthetic joint infection of the right knee, orthopedics recommended and washout with liner exchange which was done today (07/20/2022), purulence noted in the knee joint and cultures sent.  Vancomycin placed in the knee joint. -Continue current IV penicillin. -ID and orthopedics following.  3.?/Pneumonia -Patient noted on presentation to have a fever, chest x-ray done concerning for pneumonia. -Urine Legionella antigen pending, urine pneumococcus antigen negative. -Patient denies any significant change  in chronic shortness of breath, no productive cough. -Continue empiric IV antibiotics.  4.  Mild  rhabdomyolysis -CK noted to be minimally elevated on presentation as patient noted to have had a fall at home and was on the floor for approximately 3 hours. -Patient aggressively hydrated with IV fluids, due to concern for shortness of breath will saline lock IV fluids.   -Will give dose of IV Lasix.   -CK levels trending down.   -Supportive care.   5.  Transaminitis -Likely secondary to rhabdomyolysis and sepsis. -Acute hepatitis panel ordered, hep B IgM antibody nonreactive, hep B core antibody IgM nonreactive, hep C antibody nonreactive, hep B surface antigen reactive.. -LFTs trending down. -Saline lock IV fluids.  Supportive care. -ID following.  6.  Paroxysmal atrial fibrillation -Currently rate controlled.   -Xarelto held secondary to procedure done today, orthopedics to advise when to resume.    7. Bilateral lower extremity weakness/low back pain -Patient with sudden onset bilateral lower extremity weakness in the early hours of this morning when he got up to go to the restroom leaving him on the ground for about 3 hours with inability to get up.  Patient denied any bowel or bladder incontinence however peed on the floor as he was unable to get up to go to the restroom.  Denies any numbness or tingling in his legs. -May be secondary to septic picture. -CK mildly elevated. 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  L3-L4 severe left and mild to moderate right neuroforaminal narrowing which has progressed on the right.  L4-L5 mild spinal canal stenosis and moderate bilateral neural foraminal narrowing.  L5-S1 mild right neuroforaminal narrowing.  Narrowing of the lateral recesses at L2-L3, L3-L4, L4-L5, and L5-S1 could affect the descending L3, L4, L5 and S1 nerve roots respectively.  ED physician discussed with neurosurgery who reviewed films and did not feel  any surgical intervention needed at this time and recommended pain management and PT OT. -Case discussed with neurosurgery, Dr. Christella Noa who graciously agreed to see patient, reviewed films and did not feel patient's symptoms were related to any pathology in the back.  It was noted that there was no abscess or infection noted on MRI per neurosurgery, spinal cord was normal and significantly doubt if patient's symptoms are related to MRI of the L-spine. -Continue to hold statin. -Pain management.  PT/OT.  8.  BPH -Continue Flomax.    9.  Hyperlipidemia -Continue to hold statin in light of patient's bilateral lower extremity weakness and transaminitis.  10.  Dehydration -HCTZ on hold.   -Saline lock IV fluids.    11.  Hypertension -BP stable.   -Continue Cozaar.   -Continue to hold HCTZ.   -Lasix 20 mg IV every 12 hours x 2 doses.   12.  Sleep apnea -Continue CPAP nightly.   13.  Malignant neoplasm of the prostate -Status post radiation treatment. -Outpatient follow-up with urology.    DVT prophylaxis: SCDs Code Status: DNR Family Communication: Updated patient, wife and daughter at bedside. Disposition: TBD  Status is: Inpatient The patient will require care spanning > 2 midnights and should be moved to inpatient because: Severity of illness   Consultants:  ID: Dr. Juleen China 07/18/2022 Neurosurgery: Dr. Christella Noa 07/17/2022 Orthopedics: Dr. Mardelle Matte 07/18/2022  Procedures:  2D echo 07/18/2022 MRI L-spine 07/17/2022 Chest x-ray 07/17/2022 Plain films of the right knee 07/18/2022  Right knee aspiration of synovial fluid from the joint per orthopedics: Dr. Mardelle Matte 07/18/2022 Right knee 1 component revision poly exchange with irrigation and debridement per Dr. Zachery Dakins 07/20/2022  Antimicrobials:  IV azithromycin 07/17/2022>>> 07/18/2022 IV Rocephin 07/17/2022>>>> 07/18/2022 IV Zyvox 07/18/2022>>>>> 07/19/2022 Penicillin G IV 07/18/2022 >>>>>   Subjective: Patient just returned from  the OR.  Denies any chest pain or shortness of breath.  Wife and daughter stated that patient with some shortness of breath and labored breathing last night.  Patient denies any chest pain.  No abdominal pain.  Still with complaints of significant lower extremity weakness.    Objective: Vitals:   07/20/22 0817 07/20/22 1115 07/20/22 1130 07/20/22 1145  BP: 134/86 (!) 140/92 138/71 (!) 143/82  Pulse: 83 88 84 82  Resp: (!) 21 18 20 18   Temp:  97.9 F (36.6 C)    TempSrc:      SpO2: 96% 96% 95% 93%  Weight:      Height:        Intake/Output Summary (Last 24 hours) at 07/20/2022 1220 Last data filed at 07/20/2022 1127 Gross per 24 hour  Intake 940 ml  Output 1100 ml  Net -160 ml    Filed Weights   07/17/22 1133 07/19/22 0500 07/20/22 0500  Weight: 109.8 kg 114.4 kg 116.5 kg    Examination:  General exam: NAD. Respiratory system: Some bibasilar crackles noted.  No wheezing.  Fair air movement.  Speaking in full sentences. Cardiovascular system: Irregularly irregular.  No JVD.  No significant lower extremity edema.  Gastrointestinal system: Abdomen is soft, tender, nondistended, positive bowel sounds.  No rebound.  No guarding.  Central nervous system: Alert and oriented. No focal neurological deficits. Extremities: Right knee in postop bandage.  Ice on knee.  Skin: No rashes, lesions or ulcers Psychiatry: Judgement and insight appear normal. Mood & affect appropriate.     Data Reviewed: I have personally reviewed following labs and imaging studies  CBC: Recent Labs  Lab 07/17/22 1132 07/18/22 0341 07/19/22 0451 07/20/22 0409  WBC 16.3* 12.6* 11.8* 9.5  NEUTROABS 14.9* 11.2* 10.3* 8.1*  HGB 16.0 14.4 12.6* 12.3*  HCT 47.2 42.8 38.2* 36.2*  MCV 95.9 97.9 97.4 96.3  PLT 159 124* 101* 108*     Basic Metabolic Panel: Recent Labs  Lab 07/17/22 1132 07/17/22 1950 07/18/22 0340 07/18/22 0341 07/19/22 0451 07/20/22 0409  NA 135  --   --  134* 131* 129*  K 3.2*   --   --  3.1* 3.7 3.7  CL 96*  --   --  97* 99 98  CO2 26  --   --  27 24 24   GLUCOSE 125*  --   --  110* 127* 129*  BUN 17  --   --  19 21 21   CREATININE 1.07  --   --  1.13 0.76 0.69  CALCIUM 9.0  --   --  8.3* 8.1* 8.3*  MG  --  1.8 2.1  --  2.2 2.3     GFR: Estimated Creatinine Clearance: 94.3 mL/min (by C-G formula based on SCr of 0.69 mg/dL).  Liver Function Tests: Recent Labs  Lab 07/18/22 0341 07/19/22 0451 07/20/22 0409  AST 157* 111* 89*  ALT 98* 87* 83*  ALKPHOS 57 66 92  BILITOT 3.2* 3.4* 2.8*  PROT 6.0* 5.8* 5.8*  ALBUMIN 3.3* 3.3* 3.0*     CBG: Recent Labs  Lab 07/19/22 0751 07/20/22 0428 07/20/22 0735  GLUCAP 123* 119*  121*      Recent Results (from the past 240 hour(s))  SARS Coronavirus 2 by RT PCR (hospital order, performed in [facility] hospital lab) *cepheid single result test* Anterior Nasal Swab     Status: None   Collection Time: 07/17/22  3:13 PM   Specimen: Anterior Nasal Swab  Result Value Ref Range Status   SARS Coronavirus 2 by RT PCR NEGATIVE NEGATIVE Final    Comment: (NOTE) SARS-CoV-2 target nucleic acids are NOT DETECTED.  The SARS-CoV-2 RNA is generally detectable in upper and lower respiratory specimens during the acute phase of infection. The lowest concentration of SARS-CoV-2 viral copies this assay can detect is 250 copies / mL. A negative result does not preclude SARS-CoV-2 infection and should not be used as the sole basis for treatment or other patient management decisions.  A negative result may occur with improper specimen collection / handling, submission of specimen other than nasopharyngeal swab, presence of viral mutation(s) within the areas targeted by this assay, and inadequate number of viral copies (<250 copies / mL). A negative result must be combined with clinical observations, patient history, and epidemiological information.  Fact Sheet for Patients:   https://www.patel.info/  Fact  Sheet for Healthcare Providers: https://hall.com/  This test is not yet approved or  cleared by the [place] FDA and has been authorized for detection and/or diagnosis of SARS-CoV-2 by FDA under an Emergency Use Authorization (EUA).  This EUA will remain in effect (meaning this test can be used) for the duration of the COVID-19 declaration under Section 564(b)(1) of the Act, 21 U.S.C. section 360bbb-3(b)(1), unless the authorization is terminated or revoked sooner.  Performed at [facility], [place] [address], [place], [place] [address]   Culture, blood (Routine X 2) w Reflex to ID Panel     Status: Abnormal (Preliminary result)   Collection Time: 07/17/22  3:31 PM   Specimen: Left Antecubital; Blood  Result Value Ref Range Status   Specimen Description   Final    LEFT ANTECUBITAL BLOOD Performed at [place] [address] [place], [place] [address]    Special Requests   Final    BOTTLES DRAWN AEROBIC AND ANAEROBIC Blood Culture adequate volume Performed at [place] [address], [place], [place] [address]    Culture  Setup Time   Final    GRAM POSITIVE COCCI IN CHAINS IN BOTH AEROBIC AND ANAEROBIC BOTTLES CRITICAL RESULT CALLED TO, READ BACK BY AND VERIFIED WITH: PHARMD A ELLINGTON [id number] AT 21 BY CM    Culture (A)  Final    GROUP A STREP (S.PYOGENES) ISOLATED REPEATING SUSCEPTIBILITIES Performed at [facility], [place] [address] [place], [place] [address]    Report Status PENDING  Incomplete  Blood Culture ID Panel (Reflexed)     Status: Abnormal   Collection Time: 07/17/22  3:31 PM  Result Value Ref Range Status   Enterococcus faecalis NOT DETECTED NOT DETECTED Final   Enterococcus Faecium NOT DETECTED NOT DETECTED Final   Listeria monocytogenes NOT DETECTED NOT DETECTED Final   Staphylococcus species NOT DETECTED NOT DETECTED Final   Staphylococcus aureus (BCID) NOT DETECTED NOT  DETECTED Final   Staphylococcus epidermidis NOT DETECTED NOT DETECTED Final   Staphylococcus lugdunensis NOT DETECTED NOT DETECTED Final   Streptococcus species DETECTED (A) NOT DETECTED Final    Comment: CRITICAL RESULT CALLED TO, READ BACK BY AND VERIFIED WITH: PHARMD A ELLINGTON [id number] AT 815 AM BY CM  Streptococcus agalactiae NOT DETECTED NOT DETECTED Final   Streptococcus pneumoniae NOT DETECTED NOT DETECTED Final   Streptococcus pyogenes DETECTED (A) NOT DETECTED Final    Comment: CRITICAL RESULT CALLED TO, READ BACK BY AND VERIFIED WITH: PHARMD A ELLINGTON [id number] AT 815 AM BY CM    A.calcoaceticus-baumannii NOT DETECTED NOT DETECTED Final   Bacteroides fragilis NOT DETECTED NOT DETECTED Final   Enterobacterales NOT DETECTED NOT DETECTED Final   Enterobacter cloacae complex NOT DETECTED NOT DETECTED Final   Escherichia coli NOT DETECTED NOT DETECTED Final   Klebsiella aerogenes NOT DETECTED NOT DETECTED Final   Klebsiella oxytoca NOT DETECTED NOT DETECTED Final   Klebsiella pneumoniae NOT DETECTED NOT DETECTED Final   Proteus species NOT DETECTED NOT DETECTED Final   Salmonella species NOT DETECTED NOT DETECTED Final   Serratia marcescens NOT DETECTED NOT DETECTED Final   Haemophilus influenzae NOT DETECTED NOT DETECTED Final   Neisseria meningitidis NOT DETECTED NOT DETECTED Final   Pseudomonas aeruginosa NOT DETECTED NOT DETECTED Final   Stenotrophomonas maltophilia NOT DETECTED NOT DETECTED Final   Candida albicans NOT DETECTED NOT DETECTED Final   Candida auris NOT DETECTED NOT DETECTED Final   Candida glabrata NOT DETECTED NOT DETECTED Final   Candida krusei NOT DETECTED NOT DETECTED Final   Candida parapsilosis NOT DETECTED NOT DETECTED Final   Candida tropicalis NOT DETECTED NOT DETECTED Final   Cryptococcus neoformans/gattii NOT DETECTED NOT DETECTED Final    Comment: Performed at [facility], [address], [place], [place] [address]  Culture, blood  (Routine X 2) w Reflex to ID Panel     Status: Abnormal (Preliminary result)   Collection Time: 07/17/22  3:43 PM   Specimen: Right Antecubital; Blood  Result Value Ref Range Status   Specimen Description   Final    RIGHT ANTECUBITAL BLOOD Performed at [facility], [place] [address] [place], [place] [address]    Special Requests   Final    BOTTLES DRAWN AEROBIC AND ANAEROBIC Blood Culture adequate volume Performed at [place] [address] [place], [place] [address]    Culture  Setup Time   Final    GRAM POSITIVE COCCI IN CHAINS IN BOTH AEROBIC AND ANAEROBIC BOTTLES CRITICAL VALUE NOTED.  VALUE IS CONSISTENT WITH PREVIOUSLY REPORTED AND CALLED VALUE. Performed at [facility], [place] [address] [place], [place] [address]    Culture GROUP A STREP (S.PYOGENES) ISOLATED (A)  Final   Report Status PENDING  Incomplete  Body fluid culture w Gram Stain     Status: None (Preliminary result)   Collection Time: 07/18/22  2:23 PM   Specimen: Synovium; Synovial Fluid  Result Value Ref Range Status   Specimen Description   Final    SYNOVIAL R KNEE Performed at [facility] [address], [place], [place] [address]    Special Requests   Final    NONE Performed at [facility], [place] [address] [place],  [address]    Gram Stain   Final    MODERATE WBC PRESENT, PREDOMINANTLY PMN RARE GRAM POSITIVE COCCI IN CHAINS INTRACELLULAR CRITICAL RESULT CALLED TO, READ BACK BY AND VERIFIED WITH: RN REBECCA FEWELL ON 07/18/22 @ 2152 BY DRT    Culture   Final    NO GROWTH 2 DAYS Performed at [facility] [address] [place],  [address]    Report Status PENDING  Incomplete  Culture, blood (Routine X 2)  w Reflex to ID Panel     Status: None (Preliminary result)   Collection Time: 07/19/22  4:49 AM   Specimen: BLOOD LEFT HAND  Result Value Ref Range Status   Specimen Description   Final    BLOOD LEFT  HAND Performed at [facility], [place] [address] [place], [place] [address]    Special Requests   Final    BOTTLES DRAWN AEROBIC ONLY Blood Culture adequate volume Performed at [facility] [address], [place], [place] [address]    Culture   Final    NO GROWTH 1 DAY Performed at [facility], [place] [address] [place], [place] [address]    Report Status PENDING  Incomplete  Culture, blood (Routine X 2) w Reflex to ID Panel     Status: None (Preliminary result)   Collection Time: 07/19/22  4:51 AM   Specimen: BLOOD RIGHT HAND  Result Value Ref Range Status   Specimen Description   Final    BLOOD RIGHT HAND Performed at [facility], [place] [address], [place], [place] [address]    Special Requests   Final    BOTTLES DRAWN AEROBIC AND ANAEROBIC Blood Culture adequate volume Performed at [place] [address], [place], [place] [address]    Culture   Final    NO GROWTH 1 DAY Performed at [facility], [place] [address], [place], [place] [address]    Report Status PENDING  Incomplete         Radiology Studies: DG Knee Right Port  Result Date: 07/20/2022 CLINICAL DATA:  Postop. EXAM: PORTABLE RIGHT KNEE - 1-2 VIEW COMPARISON:  07/18/2022 FINDINGS: Right knee arthroplasty in expected alignment. No periprosthetic lucency or fracture. Prior patellar resurfacing. Postsurgical air in the subcutaneous tissues. Diminished knee joint effusion with air in the joint space. Wound VAC overlies the anterior knee. IMPRESSION: Diminished knee joint effusion postop.  Total arthroplasty in place. Electronically Signed   By: Keith Rake M.D.   On: 07/20/2022 12:11   ECHOCARDIOGRAM COMPLETE  Result Date: 07/18/2022    ECHOCARDIOGRAM REPORT   Patient Name:   Caleb Barton Date of Exam: 07/18/2022 Medical Rec #:  [id number]      Height:       69.0 in Accession #:    [id number]     Weight:       242.0 lb Date of Birth:  [birth year]        BSA:          2.240 m Patient Age:    89 years       BP:           107/69 mmHg Patient Gender: M              HR:           103 bpm. Exam Location:  Inpatient Procedure: 2D Echo, Color Doppler and Cardiac Doppler Indications:    Bacteremia  History:        Patient has prior history of Echocardiogram examinations, most                 recent 06/30/2019. Stroke, Arrythmias:RBBB,                 Signs/Symptoms:Dyspnea; Risk Factors:Sleep Apnea and                 Hypertension. Cancer.  Sonographer:    Eartha Inch Referring Phys: [phone]  Zykeriah Mathia V Taira Knabe  Sonographer Comments: Technically difficult study due to poor echo windows. Image acquisition challenging due to patient body habitus and Image acquisition challenging due to respiratory motion. Patient unable to be positioned for optimal imaging. IMPRESSIONS  1. Left ventricular ejection fraction, by estimation, is 60 to 65%. The left ventricle has normal function. The left ventricle has no regional wall motion abnormalities. Left ventricular diastolic parameters are indeterminate.  2. Right ventricular systolic function is normal. The right ventricular size is normal. Tricuspid regurgitation signal is inadequate for assessing PA pressure.  3. No evidence of mitral valve regurgitation.  4. The aortic valve was not well visualized. Aortic valve regurgitation is not visualized.  5. Aneurysm of the aortic root, measuring 42 mm.  6. The inferior vena cava is dilated in size with <50% respiratory variability, suggesting right atrial pressure of 15 mmHg. Conclusion(s)/Recommendation(s): No large vegetations seen visualized. aortic valve not well visualized. FINDINGS  Left Ventricle: Left ventricular ejection fraction, by estimation, is 60 to 65%. The left ventricle has normal function. The left ventricle has no regional wall motion abnormalities. The left ventricular internal cavity size was normal in size. There is  no left ventricular hypertrophy. Left ventricular  diastolic parameters are indeterminate. Right Ventricle: The right ventricular size is normal. Right ventricular systolic function is normal. Tricuspid regurgitation signal is inadequate for assessing PA pressure. Left Atrium: Left atrial size was normal in size. Right Atrium: Right atrial size was normal in size. Pericardium: There is no evidence of pericardial effusion. Mitral Valve: No evidence of mitral valve regurgitation. MV peak gradient, 7.7 mmHg. The mean mitral valve gradient is 4.0 mmHg. Tricuspid Valve: Tricuspid valve regurgitation is not demonstrated. Aortic Valve: The aortic valve was not well visualized. Aortic valve regurgitation is not visualized. Pulmonic Valve: Pulmonic valve regurgitation is not visualized. Aorta: There is an aneurysm involving the aortic root measuring 42 mm. Venous: The inferior vena cava is dilated in size with less than 50% respiratory variability, suggesting right atrial pressure of 15 mmHg. IAS/Shunts: The interatrial septum was not well visualized.  LEFT VENTRICLE PLAX 2D LVIDd:         5.70 cm      Diastology LVIDs:         4.50 cm      LV e' medial:    5.87 cm/s LV PW:         0.90 cm      LV E/e' medial:  22.0 LV IVS:        0.80 cm      LV e' lateral:   9.57 cm/s LVOT diam:     2.30 cm      LV E/e' lateral: 13.5 LV SV:         73 LV SV Index:   33 LVOT Area:     4.15 cm  LV Volumes (MOD) LV vol d, MOD A2C: 104.0 ml LV vol d, MOD A4C: 63.5 ml LV vol s, MOD A2C: 54.0 ml LV vol s, MOD A4C: 38.3 ml LV SV MOD A2C:     50.0 ml LV SV MOD A4C:     63.5 ml LV SV MOD BP:      37.3 ml RIGHT VENTRICLE            IVC RV S prime:     9.57 cm/s  IVC diam: 2.50 cm TAPSE (M-mode): 0.9 cm LEFT ATRIUM             Index  RIGHT ATRIUM           Index LA diam:        3.80 cm 1.70 cm/m   RA Area:     21.30 cm LA Vol (A2C):   75.7 ml 33.79 ml/m  RA Volume:   56.10 ml  25.04 ml/m LA Vol (A4C):   58.6 ml 26.16 ml/m LA Biplane Vol: 72.2 ml 32.23 ml/m  AORTIC VALVE LVOT Vmax:    103.00 cm/s LVOT Vmean:  80.900 cm/s LVOT VTI:    0.176 m  AORTA Ao Root diam: 4.20 cm Ao Asc diam:  4.40 cm MITRAL VALVE MV Area (PHT): 5.16 cm     SHUNTS MV Area VTI:   3.18 cm     Systemic VTI:  0.18 m MV Peak grad:  7.7 mmHg     Systemic Diam: 2.30 cm MV Mean grad:  4.0 mmHg MV Vmax:       1.39 m/s MV Vmean:      93.7 cm/s MV Decel Time: 147 msec MV E velocity: 129.00 cm/s Landscape architect signed by Phineas Inches Signature Date/Time: 07/18/2022/4:37:12 PM    Final         Scheduled Meds:  [MAR Hold] vitamin C  1,000 mg Oral Daily   [MAR Hold] Chlorhexidine Gluconate Cloth  6 each Topical Q0600   [MAR Hold] dorzolamide-timolol  1 drop Both Eyes BID   [MAR Hold] latanoprost  1 drop Both Eyes QHS   [MAR Hold] losartan  50 mg Oral QHS   [MAR Hold] multivitamin with minerals  1 tablet Oral Daily   [MAR Hold] sodium chloride flush  3 mL Intravenous Q12H   [MAR Hold] tamsulosin  0.4 mg Oral Daily   Continuous Infusions:  acetaminophen     acetaminophen     [MAR Hold] penicillin G potassium 12 Million Units in dextrose 5 % 500 mL CONTINUOUS infusion 12 Million Units (07/20/22 0449)     LOS: 2 days    Time spent: 40 minutes    Irine Seal, MD [facility]   To contact the attending provider between 7A-7P or the covering provider during after hours 7P-7A, please log into the web site [email or web address] and access using universal [place] password for that web site. If you do not have the password, please call the hospital operator.  07/20/2022, 12:20 PM

## 2022-07-20 NOTE — Anesthesia Preprocedure Evaluation (Addendum)
Anesthesia Evaluation  Patient identified by MRN, date of birth, ID band Patient awake    Reviewed: Allergy & Precautions, NPO status , Patient's Chart, lab work & pertinent test results  History of Anesthesia Complications Negative for: history of anesthetic complications  Airway Mallampati: II  TM Distance: >3 FB Neck ROM: Full    Dental  (+) Teeth Intact, Dental Advisory Given   Pulmonary shortness of breath, sleep apnea and Continuous Positive Airway Pressure Ventilation , neg recent URI, former smoker   breath sounds clear to auscultation       Cardiovascular hypertension, Pt. on medications (-) angina + CAD  (-) Past MI + dysrhythmias Atrial Fibrillation  Rhythm:Irregular  1. Left ventricular ejection fraction, by estimation, is 60 to 65%. The  left ventricle has normal function. The left ventricle has no regional  wall motion abnormalities. Left ventricular diastolic parameters are  indeterminate.   2. Right ventricular systolic function is normal. The right ventricular  size is normal. Tricuspid regurgitation signal is inadequate for assessing  PA pressure.   3. No evidence of mitral valve regurgitation.   4. The aortic valve was not well visualized. Aortic valve regurgitation  is not visualized.   5. Aneurysm of the aortic root, measuring 42 mm.   6. The inferior vena cava is dilated in size with <50% respiratory  variability, suggesting right atrial pressure of 15 mmHg.    2021 cath:  Mid RCA lesion is 30% stenosed.  Ost RCA lesion is 20% stenosed.  Ost LAD lesion is 20% stenosed.  Ramus lesion is 60% stenosed.   1.  Mild nonobstructive coronary artery disease.  Borderline stenosis in ostial ramus. 2.  Left ventricular angiography was not performed.  EF was normal by echo. 3.  Mildly elevated left ventricular end-diastolic pressure at 15 mmHg.      Neuro/Psych TIA negative psych ROS    GI/Hepatic negative GI ROS,,,Lab Results      Component                Value               Date                      ALT                      83 (H)              07/20/2022                AST                      89 (H)              07/20/2022                ALKPHOS                  92                  07/20/2022                BILITOT                  2.8 (H)             07/20/2022             Elevated liver enymes   Endo/Other  negative endocrine  ROS    Renal/GU negative Renal ROS     Musculoskeletal  (+) Arthritis ,  RIGHT KNEE INFECTION   Abdominal   Peds  Hematology  (+) Blood dyscrasia Lab Results      Component                Value               Date                      WBC                      9.5                 07/20/2022                HGB                      12.3 (L)            07/20/2022                HCT                      36.2 (L)            07/20/2022                MCV                      96.3                07/20/2022                PLT                      108 (L)             07/20/2022             Lab Results      Component                Value               Date                      INR                      1.2                 07/20/2022                INR                      1.8 (H)             07/19/2022                INR                      2.2 (H)             07/18/2022            Xarelto held   Anesthesia Other Findings   Reproductive/Obstetrics  Anesthesia Physical Anesthesia Plan  ASA: 3  Anesthesia Plan: General   Post-op Pain Management: Ofirmev IV (intra-op)*   Induction: Intravenous  PONV Risk Score and Plan: 2 and Ondansetron and Dexamethasone  Airway Management Planned: Oral ETT  Additional Equipment: None  Intra-op Plan:   Post-operative Plan: Extubation in OR  Informed Consent: I have reviewed the patients History and Physical, chart, labs and discussed the  procedure including the risks, benefits and alternatives for the proposed anesthesia with the patient or authorized representative who has indicated his/her understanding and acceptance.   Patient has DNR.  Discussed DNR with patient and Continue DNR.   Dental advisory given  Plan Discussed with: CRNA  Anesthesia Plan Comments:        Anesthesia Quick Evaluation

## 2022-07-20 NOTE — Plan of Care (Signed)
  Problem: Skin Integrity: Goal: Risk for impaired skin integrity will decrease Outcome: Progressing   Problem: Safety: Goal: Ability to remain free from injury will improve Outcome: Progressing   Problem: Pain Managment: Goal: General experience of comfort will improve Outcome: Progressing   Problem: Coping: Goal: Level of anxiety will decrease Outcome: Progressing   

## 2022-07-21 ENCOUNTER — Encounter (HOSPITAL_COMMUNITY): Payer: Self-pay | Admitting: Orthopedic Surgery

## 2022-07-21 ENCOUNTER — Other Ambulatory Visit: Payer: Self-pay

## 2022-07-21 DIAGNOSIS — J189 Pneumonia, unspecified organism: Secondary | ICD-10-CM | POA: Diagnosis not present

## 2022-07-21 DIAGNOSIS — A400 Sepsis due to streptococcus, group A: Secondary | ICD-10-CM | POA: Diagnosis not present

## 2022-07-21 DIAGNOSIS — R7881 Bacteremia: Secondary | ICD-10-CM | POA: Diagnosis not present

## 2022-07-21 DIAGNOSIS — M1711 Unilateral primary osteoarthritis, right knee: Secondary | ICD-10-CM | POA: Diagnosis not present

## 2022-07-21 LAB — COMPREHENSIVE METABOLIC PANEL
ALT: 72 U/L — ABNORMAL HIGH (ref 0–44)
AST: 56 U/L — ABNORMAL HIGH (ref 15–41)
Albumin: 2.5 g/dL — ABNORMAL LOW (ref 3.5–5.0)
Alkaline Phosphatase: 93 U/L (ref 38–126)
Anion gap: 7 (ref 5–15)
BUN: 23 mg/dL (ref 8–23)
CO2: 30 mmol/L (ref 22–32)
Calcium: 8.4 mg/dL — ABNORMAL LOW (ref 8.9–10.3)
Chloride: 98 mmol/L (ref 98–111)
Creatinine, Ser: 0.79 mg/dL (ref 0.61–1.24)
GFR, Estimated: >60 mL/min (ref >60)
Glucose, Bld: 158 mg/dL — ABNORMAL HIGH (ref 70–99)
Potassium: 3.9 mmol/L (ref 3.5–5.1)
Sodium: 135 mmol/L (ref 135–145)
Total Bilirubin: 1.7 mg/dL — ABNORMAL HIGH (ref 0.3–1.2)
Total Protein: 5.6 g/dL — ABNORMAL LOW (ref 6.5–8.1)

## 2022-07-21 LAB — CBC WITH DIFFERENTIAL/PLATELET
Abs Immature Granulocytes: 0.11 10*3/uL — ABNORMAL HIGH (ref 0.00–0.07)
Basophils Absolute: 0.1 10*3/uL (ref 0.0–0.1)
Basophils Relative: 1 %
Eosinophils Absolute: 0 10*3/uL (ref 0.0–0.5)
Eosinophils Relative: 0 %
HCT: 35.5 % — ABNORMAL LOW (ref 39.0–52.0)
Hemoglobin: 11.8 g/dL — ABNORMAL LOW (ref 13.0–17.0)
Immature Granulocytes: 1 %
Lymphocytes Relative: 4 %
Lymphs Abs: 0.5 10*3/uL — ABNORMAL LOW (ref 0.7–4.0)
MCH: 32.8 pg (ref 26.0–34.0)
MCHC: 33.2 g/dL (ref 30.0–36.0)
MCV: 98.6 fL (ref 80.0–100.0)
Monocytes Absolute: 1 10*3/uL (ref 0.1–1.0)
Monocytes Relative: 9 %
Neutro Abs: 9.4 10*3/uL — ABNORMAL HIGH (ref 1.7–7.7)
Neutrophils Relative %: 85 %
Platelets: 127 10*3/uL — ABNORMAL LOW (ref 150–400)
RBC: 3.6 MIL/uL — ABNORMAL LOW (ref 4.22–5.81)
RDW: 14.3 % (ref 11.5–15.5)
WBC: 11 10*3/uL — ABNORMAL HIGH (ref 4.0–10.5)
nRBC: 0 % (ref 0.0–0.2)

## 2022-07-21 LAB — CULTURE, BLOOD (ROUTINE X 2)
Special Requests: ADEQUATE
Special Requests: ADEQUATE

## 2022-07-21 LAB — GLUCOSE, CAPILLARY: Glucose-Capillary: 130 mg/dL — ABNORMAL HIGH (ref 70–99)

## 2022-07-21 LAB — MAGNESIUM: Magnesium: 2.3 mg/dL (ref 1.7–2.4)

## 2022-07-21 MED ORDER — PANTOPRAZOLE SODIUM 40 MG PO TBEC
40.0000 mg | DELAYED_RELEASE_TABLET | Freq: Every day | ORAL | Status: DC
  Administered 2022-07-21 – 2022-07-28 (×8): 40 mg via ORAL
  Filled 2022-07-21 (×8): qty 1

## 2022-07-21 MED ORDER — FUROSEMIDE 10 MG/ML IJ SOLN
20.0000 mg | Freq: Once | INTRAMUSCULAR | Status: AC
  Administered 2022-07-21: 20 mg via INTRAVENOUS
  Filled 2022-07-21: qty 2

## 2022-07-21 NOTE — Progress Notes (Signed)
Diagnosis: GAS right knee PJI, s/p I&D and polyexchange  GAS bacteremia    Culture Result: 3/31 I&D culture negative; 3/29 right knee aspirate cx rare gpc in chain intracellular; 3/30 bcx negative; 3/28 bcx GAS  Allergies  Allergen Reactions   Lisinopril Cough   Sulfa Antibiotics Rash    OPAT Orders Discharge antibiotics to be given via PICC line Discharge antibiotics: Penicillin g  Duration: 6 weeks iv pcn g starting 4/01 After he finishes with iv abx, there would be consideration for oral suppressive abx and will decide in clinic f/u  End Date: 08/31/22  [facility] Care Per Protocol:  Home health RN for IV administration and teaching; PICC line care and labs.    Labs weekly while on IV antibiotics: _x_ CBC with differential __ BMP _x_ CMP _x_ CRP __ ESR __ Vancomycin trough __ CK  _x_ Please pull PIC at completion of IV antibiotics __ Please leave PIC in place until doctor has seen patient or been notified  Fax weekly labs to [phone]  Clinic Follow Up Appt: 4/23 @ 345 pm  @  [facility] [address], [place], [place] [address] Phone: [phone]     I spent more than 10 minute reviewing data/chart, and coordinating care discussing diagnostics/treatment plan with care team

## 2022-07-21 NOTE — Care Management Important Message (Signed)
Important Message  Patient Details IM Letter given Name: Caleb Barton MRN: [id number] Date of Birth: [birth year]   Medicare Important Message Given:  Yes     Kerin Salen 07/21/2022, 2:37 PM

## 2022-07-21 NOTE — Progress Notes (Signed)
PROGRESS NOTE    Caleb Barton  [id number] DOB: 1940/08/30 DOA: 07/17/2022 PCP: Glenis Smoker, MD    Chief Complaint  Patient presents with   Extremity Weakness    Brief Narrative:  Patient 82 year old gentleman history of paroxysmal atrial fibrillation on anticoagulation, hypertension, hyperlipidemia, OSA on CPAP, remote history of thalamic CVA, BPH, history of prostate cancer status postradiation treatment presented to the ED with sudden onset lower extremity weakness, worsening lower back pain.  Patient's status post ablation of nerves in the lower back by neurosurgery approximately 2 weeks prior to admission.  Patient presented sudden onset lower extremity weakness, noted to be on the ground for approximately 3 hours prior to getting help and EMS being called and brought to the ED.  Patient seen in the ED MRI of the L-spine done, MRI reviewed by neurosurgery who does not feel any surgical intervention needed at this time and does not feel patient's weakness secondary to lumbar spine.  Patient noted to spike a fever in the ED, chest x-ray done concerning for bilateral lower lobe pneumonia.  Patient pancultured blood cultures positive for Streptococcus pyogenes.  Patient placed empirically on IV antibiotics.  Patient also with complaints of right knee pain, plain films of the right knee done concerning for knee effusion.  ID, orthopedics consulted.  IV antibiotics adjusted per ID.   Assessment & Plan:   Principal Problem:   Sepsis due to group A Streptococcus without acute organ dysfunction Active Problems:   Streptococcal bacteremia   HTN (hypertension)   Stroke   Sleep apnea   Right knee DJD   Hypercholesteremia   Malignant neoplasm of prostate   Pneumonia   Lower extremity weakness   Lower back pain   Paroxysmal A-fib   Knee pain, right   Prosthetic joint infection  #1 sepsis secondary to group A strep/Streptococcus pyogenes bacteremia -Patient presented with  generalized weakness, noted to have a fever in the ED, patient pancultured initial chest x-ray concerning for bibasilar pneumonia. -Blood cultures positive for Streptococcus pyogenes. -Unclear source. -MRI of the L-spine done on admission did not show any obvious evidence of abscess formation discitis. -Patient also noted with right prosthetic knee pain, plain films of the right knee done concerning for joint effusion. -Concern patient may have seeded to this joint, orthopedics consulted for possible aspiration and further evaluation. -See problem #2. -2D echo ordered with normal EF, no significant vegetations noted.   -Repeat blood cultures done with no growth to date. -ID consulted who have assessed the patient, IV antibiotics adjusted and patient now on penicillin G.   Was on linezolid which was discontinued per ID on 07/19/2022. -Per ID patient will need 6 weeks of IV penicillin G with start date 07/21/2022, per ID once IV antibiotics are completed there could be consideration for oral suppressive antibiotics which will be decided on clinic follow-up. -Place PICC line. -Patient already scheduled for ID clinic follow-up on 08/12/2022 at 3:45 PM. -Per ID. [name]  2.  Right prosthetic knee infection -Patient with complaints of sudden onset right knee pain with difficulty bending his joint since admission. -Plain films done concerning for moderate to large joint effusion. -Orthopedics consulted and patient subsequently underwent right knee aspiration with preliminary cultures positive for group A strep with sensitivities pending.  -Due to concern for prosthetic joint infection of the right knee, orthopedics recommended and washout with liner exchange which was done (07/20/2022), purulence noted in the knee joint and cultures sent and pending.  Vancomycin placed  in the knee joint. -Continue current IV penicillin. -ID and orthopedics following.  3.?/Pneumonia -Patient noted on presentation to have a  fever, chest x-ray done concerning for pneumonia. -Urine Legionella antigen pending, urine pneumococcus antigen negative. -Patient denies any significant change in chronic shortness of breath, no productive cough. -Continue empiric IV antibiotics.  4.  Mild rhabdomyolysis -CK noted to be minimally elevated on presentation as patient noted to have had a fall at home and was on the floor for approximately 3 hours. -Patient aggressively hydrated with IV fluids, due to concern for shortness of breath IV fluids saline lock.   -Patient given IV Lasix.   -CK levels trending down.   -Supportive care.    5.  Transaminitis -Likely secondary to rhabdomyolysis and sepsis. -Acute hepatitis panel ordered, hep B IgM antibody nonreactive, hep B core antibody IgM nonreactive, hep C antibody nonreactive, hep B surface antigen reactive.. -LFTs trending down. -Saline lock IV fluids.  Supportive care. -ID following.  6.  Paroxysmal atrial fibrillation -Currently rate controlled.   -Xarelto held secondary to procedure done.  Orthopedics recommended Xarelto 10 mg postop day 1 2, and resume full dose Xarelto postop day #3.   7. Bilateral lower extremity weakness/low back pain, POA -Patient with sudden onset bilateral lower extremity weakness in the early hours of this morning when he got up to go to the restroom leaving him on the ground for about 3 hours with inability to get up.  Patient denied any bowel or bladder incontinence however peed on the floor as he was unable to get up to go to the restroom.  Denies any numbness or tingling in his legs. -May be secondary to sepsis secondary to group A strep bacteremia. -CK mildly elevated on admission but trending back down. 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  L3-L4 severe left and mild to moderate right neuroforaminal narrowing  which has progressed on the right.  L4-L5 mild spinal canal stenosis and moderate bilateral neural foraminal narrowing.  L5-S1 mild right neuroforaminal narrowing.  Narrowing of the lateral recesses at L2-L3, L3-L4, L4-L5, and L5-S1 could affect the descending L3, L4, L5 and S1 nerve roots respectively.  ED physician discussed with neurosurgery who reviewed films and did not feel any surgical intervention needed at this time and recommended pain management and PT OT. -Case discussed with neurosurgery, Dr. Christella Noa who graciously agreed to see patient, reviewed films and did not feel patient's symptoms were related to any pathology in the back.  It was noted that there was no abscess or infection noted on MRI per neurosurgery, spinal cord was normal and significantly doubt if patient's symptoms are related to MRI of the L-spine. -Continue to hold statin. -Continue treatment for group A strep bacteremia. -Pain management.  PT/OT.  8.  BPH -Flomax.  9.  Hyperlipidemia -Continue to hold statin in light of patient's bilateral lower extremity weakness and transaminitis. -Outpatient follow-up.  10.  Dehydration -HCTZ on hold.   -Saline lock IV fluids.    11.  Hypertension -BP stable.   -Continue Cozaar.  -Status post IV Lasix every 12 hours x 2 doses. -Will give a dose of Lasix 20 mg IV x 1. -Likely resume home regimen HCTZ tomorrow.  12.  Sleep apnea -CPAP nightly.   13.  Malignant neoplasm of the prostate -Status post radiation treatment. -Outpatient follow-up with urology.  DVT prophylaxis: SCDs Code Status: DNR Family Communication: Updated patient, wife at bedside. Disposition: TBD  Status is: Inpatient The patient will require care spanning > 2 midnights and should be moved to inpatient because: Severity of illness   Consultants:  ID: Dr. Juleen China 07/18/2022 Neurosurgery: Dr. Christella Noa 07/17/2022 Orthopedics: Dr. Mardelle Matte 07/18/2022  Procedures:  2D echo 07/18/2022 MRI L-spine  07/17/2022 Chest x-ray 07/17/2022 Plain films of the right knee 07/18/2022 Right knee aspiration of synovial fluid from the joint per orthopedics: Dr. Mardelle Matte 07/18/2022 Right knee 1 component revision poly exchange with irrigation and debridement per Dr. Zachery Dakins 07/20/2022  Antimicrobials:  IV azithromycin 07/17/2022>>> 07/18/2022 IV Rocephin 07/17/2022>>>> 07/18/2022 IV Zyvox 07/18/2022>>>>> 07/19/2022 Penicillin G IV 07/18/2022 >>>>>   Subjective: Patient laying in bed.  Feels breathing has improved since yesterday and close to his baseline.  No chest pain.  No abdominal pain.  Some improvement with his generalized weakness.  Some improvement with pain in the right knee.     Objective: Vitals:   07/20/22 2300 07/21/22 0427 07/21/22 0500 07/21/22 1417  BP: (!) 152/95 123/86  138/71  Pulse: 70   87  Resp: 14 18  18   Temp: 98.6 F (37 C) 98.4 F (36.9 C)  97.8 F (36.6 C)  TempSrc: Oral Oral  Oral  SpO2: 98%   99%  Weight:   114.4 kg   Height:        Intake/Output Summary (Last 24 hours) at 07/21/2022 1533 Last data filed at 07/21/2022 1300 Gross per 24 hour  Intake 603 ml  Output 2175 ml  Net -1572 ml    Filed Weights   07/19/22 0500 07/20/22 0500 07/21/22 0500  Weight: 114.4 kg 116.5 kg 114.4 kg    Examination:  General exam: NAD. Respiratory system: Lungs clear to auscultation bilaterally.  No wheezes, no crackles, no rhonchi.  Fair air movement.  Speaking in full sentences.  Cardiovascular system: Irregularly irregular.  No JVD.  Trace left lower extremity edema.  Right lower extremity wrapped in postop dressing.  Gastrointestinal system: Abdomen is soft, nontender, nondistended, positive bowel sounds.  No rebound.  No guarding.  Central nervous system: Alert and oriented. No focal neurological deficits. Extremities: Right lower extremity wrapped in postop bandage. 4/5 left lower extremity strength. Skin: No rashes, lesions or ulcers Psychiatry: Judgement and insight  appear normal. Mood & affect appropriate.     Data Reviewed: I have personally reviewed following labs and imaging studies  CBC: Recent Labs  Lab 07/17/22 1132 07/18/22 0341 07/19/22 0451 07/20/22 0409 07/21/22 0415  WBC 16.3* 12.6* 11.8* 9.5 11.0*  NEUTROABS 14.9* 11.2* 10.3* 8.1* 9.4*  HGB 16.0 14.4 12.6* 12.3* 11.8*  HCT 47.2 42.8 38.2* 36.2* 35.5*  MCV 95.9 97.9 97.4 96.3 98.6  PLT 159 124* 101* 108* 127*     Basic Metabolic Panel: Recent Labs  Lab 07/17/22 1132 07/17/22 1950 07/18/22 0340 07/18/22 0341 07/19/22 0451 07/20/22 0409 07/21/22 0415  NA 135  --   --  134* 131* 129* 135  K 3.2*  --   --  3.1* 3.7 3.7 3.9  CL 96*  --   --  97* 99 98 98  CO2 26  --   --  27 24 24 30   GLUCOSE 125*  --   --  110* 127* 129* 158*  BUN 17  --   --  19 21 21 23   CREATININE 1.07  --   --  1.13 0.76 0.69 0.79  CALCIUM 9.0  --   --  8.3* 8.1* 8.3* 8.4*  MG  --  1.8 2.1  --  2.2 2.3 2.3     GFR: Estimated Creatinine Clearance: 93.4 mL/min (by C-G formula based on SCr of 0.79 mg/dL).  Liver Function Tests: Recent Labs  Lab 07/18/22 0341 07/19/22 0451 07/20/22 0409 07/21/22 0415  AST 157* 111* 89* 56*  ALT 98* 87* 83* 72*  ALKPHOS 57 66 92 93  BILITOT 3.2* 3.4* 2.8* 1.7*  PROT 6.0* 5.8* 5.8* 5.6*  ALBUMIN 3.3* 3.3* 3.0* 2.5*     CBG: Recent Labs  Lab 07/19/22 0751 07/20/22 0428 07/20/22 0735 07/21/22 0807  GLUCAP 123* 119* 121* 130*      Recent Results (from the past 240 hour(s))  SARS Coronavirus 2 by RT PCR (hospital order, performed in [facility] hospital lab) *cepheid single result test* Anterior Nasal Swab     Status: None   Collection Time: 07/17/22  3:13 PM   Specimen: Anterior Nasal Swab  Result Value Ref Range Status   SARS Coronavirus 2 by RT PCR NEGATIVE NEGATIVE Final    Comment: (NOTE) SARS-CoV-2 target nucleic acids are NOT DETECTED.  The SARS-CoV-2 RNA is generally detectable in upper and lower respiratory specimens during the  acute phase of infection. The lowest concentration of SARS-CoV-2 viral copies this assay can detect is 250 copies / mL. A negative result does not preclude SARS-CoV-2 infection and should not be used as the sole basis for treatment or other patient management decisions.  A negative result may occur with improper specimen collection / handling, submission of specimen other than nasopharyngeal swab, presence of viral mutation(s) within the areas targeted by this assay, and inadequate number of viral copies (<250 copies / mL). A negative result must be combined with clinical observations, patient history, and epidemiological information.  Fact Sheet for Patients:   https://www.patel.info/  Fact Sheet for Healthcare Providers: https://hall.com/  This test is not yet approved or  cleared by the [place] FDA and has been authorized for detection and/or diagnosis of SARS-CoV-2 by FDA under an Emergency Use Authorization (EUA).  This EUA will remain in effect (meaning this test can be used) for the duration of the COVID-19 declaration under Section 564(b)(1) of the Act, 21 U.S.C. section 360bbb-3(b)(1), unless the authorization is terminated or revoked sooner.  Performed at [facility], [place] [address] [place], [place] [address]   Culture, blood (Routine X 2) w Reflex to ID Panel     Status: Abnormal   Collection Time: 07/17/22  3:31 PM   Specimen: Left Antecubital; Blood  Result Value Ref Range Status   Specimen Description   Final    LEFT ANTECUBITAL BLOOD Performed at [facility], [place] [address], [place], [place] [address]    Special Requests   Final    BOTTLES DRAWN AEROBIC AND ANAEROBIC Blood Culture adequate volume Performed at [place] [address] [place], [place] [address]    Culture  Setup Time   Final    GRAM POSITIVE COCCI IN CHAINS IN BOTH AEROBIC AND ANAEROBIC  BOTTLES CRITICAL RESULT CALLED TO, READ BACK BY AND VERIFIED WITH: PHARMD A Buford Dresser [id number] AT 11 BY CM    Culture (A)  Final    GROUP A STREP (S.PYOGENES) ISOLATED HEALTH DEPARTMENT NOTIFIED Performed at [facility], [place] [address] [place], [place] [address]    Report Status 07/21/2022 FINAL  Final   Organism ID, Bacteria GROUP A STREP (S.PYOGENES) ISOLATED  Final  Susceptibility   Group a strep (s.pyogenes) isolated - MIC*    PENICILLIN <=0.06 SENSITIVE Sensitive     CEFTRIAXONE <=0.12 SENSITIVE Sensitive     ERYTHROMYCIN <=0.12 SENSITIVE Sensitive     LEVOFLOXACIN 0.5 SENSITIVE Sensitive     VANCOMYCIN 0.25 SENSITIVE Sensitive     * GROUP A STREP (S.PYOGENES) ISOLATED  Blood Culture ID Panel (Reflexed)     Status: Abnormal   Collection Time: 07/17/22  3:31 PM  Result Value Ref Range Status   Enterococcus faecalis NOT DETECTED NOT DETECTED Final   Enterococcus Faecium NOT DETECTED NOT DETECTED Final   Listeria monocytogenes NOT DETECTED NOT DETECTED Final   Staphylococcus species NOT DETECTED NOT DETECTED Final   Staphylococcus aureus (BCID) NOT DETECTED NOT DETECTED Final   Staphylococcus epidermidis NOT DETECTED NOT DETECTED Final   Staphylococcus lugdunensis NOT DETECTED NOT DETECTED Final   Streptococcus species DETECTED (A) NOT DETECTED Final    Comment: CRITICAL RESULT CALLED TO, READ BACK BY AND VERIFIED WITH: PHARMD A ELLINGTON [id number] AT 815 AM BY CM    Streptococcus agalactiae NOT DETECTED NOT DETECTED Final   Streptococcus pneumoniae NOT DETECTED NOT DETECTED Final   Streptococcus pyogenes DETECTED (A) NOT DETECTED Final    Comment: CRITICAL RESULT CALLED TO, READ BACK BY AND VERIFIED WITH: PHARMD A ELLINGTON [id number] AT 815 AM BY CM    A.calcoaceticus-baumannii NOT DETECTED NOT DETECTED Final   Bacteroides fragilis NOT DETECTED NOT DETECTED Final   Enterobacterales NOT DETECTED NOT DETECTED Final   Enterobacter cloacae complex NOT DETECTED NOT  DETECTED Final   Escherichia coli NOT DETECTED NOT DETECTED Final   Klebsiella aerogenes NOT DETECTED NOT DETECTED Final   Klebsiella oxytoca NOT DETECTED NOT DETECTED Final   Klebsiella pneumoniae NOT DETECTED NOT DETECTED Final   Proteus species NOT DETECTED NOT DETECTED Final   Salmonella species NOT DETECTED NOT DETECTED Final   Serratia marcescens NOT DETECTED NOT DETECTED Final   Haemophilus influenzae NOT DETECTED NOT DETECTED Final   Neisseria meningitidis NOT DETECTED NOT DETECTED Final   Pseudomonas aeruginosa NOT DETECTED NOT DETECTED Final   Stenotrophomonas maltophilia NOT DETECTED NOT DETECTED Final   Candida albicans NOT DETECTED NOT DETECTED Final   Candida auris NOT DETECTED NOT DETECTED Final   Candida glabrata NOT DETECTED NOT DETECTED Final   Candida krusei NOT DETECTED NOT DETECTED Final   Candida parapsilosis NOT DETECTED NOT DETECTED Final   Candida tropicalis NOT DETECTED NOT DETECTED Final   Cryptococcus neoformans/gattii NOT DETECTED NOT DETECTED Final    Comment: Performed at [facility], [address] [place], [place] [address]  Culture, blood (Routine X 2) w Reflex to ID Panel     Status: Abnormal   Collection Time: 07/17/22  3:43 PM   Specimen: Right Antecubital; Blood  Result Value Ref Range Status   Specimen Description   Final    RIGHT ANTECUBITAL BLOOD Performed at [facility], [place] [address], [place], [place] [address]    Special Requests   Final    BOTTLES DRAWN AEROBIC AND ANAEROBIC Blood Culture adequate volume Performed at [place] [address] [place], [place] [address]    Culture  Setup Time   Final    GRAM POSITIVE COCCI IN CHAINS IN BOTH AEROBIC AND ANAEROBIC BOTTLES CRITICAL VALUE NOTED.  VALUE IS CONSISTENT WITH PREVIOUSLY REPORTED AND CALLED VALUE.    Culture (A)  Final    GROUP A STREP (S.PYOGENES) ISOLATED SUSCEPTIBILITIES PERFORMED ON PREVIOUS  CULTURE WITHIN THE LAST 5 DAYS. Performed  at [facility], [place] [address], [place], [place] [address]    Report Status 07/21/2022 FINAL  Final  Body fluid culture w Gram Stain     Status: None (Preliminary result)   Collection Time: 07/18/22  2:23 PM   Specimen: Synovium; Synovial Fluid  Result Value Ref Range Status   Specimen Description   Final    SYNOVIAL R KNEE Performed at [place] [address], [place], [place] [address]    Special Requests   Final    NONE Performed at [facility], [place] [address] [place], [place] [address]    Gram Stain   Final    MODERATE WBC PRESENT, PREDOMINANTLY PMN RARE GRAM POSITIVE COCCI IN CHAINS INTRACELLULAR CRITICAL RESULT CALLED TO, READ BACK BY AND VERIFIED WITH: RN REBECCA FEWELL ON 07/18/22 @ 2152 BY DRT    Culture   Final    RARE GROUP A STREP (S.PYOGENES) ISOLATED SUSCEPTIBILITIES PERFORMED ON PREVIOUS CULTURE WITHIN THE LAST 5 DAYS. Performed at [facility], [place] [address], [place], [place] [address]    Report Status PENDING  Incomplete  Anaerobic culture w Gram Stain     Status: None (Preliminary result)   Collection Time: 07/18/22  2:23 PM   Specimen: Synovium; Synovial Fluid  Result Value Ref Range Status   Specimen Description   Final    SYNOVIAL R KNEE Performed at [place] [address], [place], [place] [address]    Special Requests   Final    NONE Performed at [facility], [place] [address] [place], [place] [address]    Gram Stain PENDING  Incomplete   Culture   Final    NO ANAEROBES ISOLATED; CULTURE IN PROGRESS FOR 5 DAYS   Report Status PENDING  Incomplete  Culture, blood (Routine X 2) w Reflex to ID Panel     Status: None (Preliminary result)   Collection Time: 07/19/22  4:49 AM   Specimen: BLOOD LEFT HAND  Result Value Ref Range Status   Specimen Description   Final    BLOOD LEFT HAND Performed at [facility], [place] [address], [place], [place]  [address]    Special Requests   Final    BOTTLES DRAWN AEROBIC ONLY Blood Culture adequate volume Performed at [place] [address] [place], [place] [address]    Culture   Final    NO GROWTH 2 DAYS Performed at [place] [address], [place], [place] [address]    Report Status PENDING  Incomplete  Culture, blood (Routine X 2) w Reflex to ID Panel     Status: None (Preliminary result)   Collection Time: 07/19/22  4:51 AM   Specimen: BLOOD RIGHT HAND  Result Value Ref Range Status   Specimen Description   Final    BLOOD RIGHT HAND Performed at [facility], [place] [address], [place], [place] [address]    Special Requests   Final    BOTTLES DRAWN AEROBIC AND ANAEROBIC Blood Culture adequate volume Performed at [place] [address], [place], [place] [address]    Culture   Final    NO GROWTH 2 DAYS Performed at [place] [address] [place], [place] [address]    Report Status PENDING  Incomplete  Aerobic/Anaerobic Culture w Gram Stain (surgical/deep wound)  Status: None (Preliminary result)   Collection Time: 07/20/22  9:41 AM   Specimen: Synovial, Right Knee; Body Fluid  Result Value Ref Range Status   Specimen Description TISSUE  Final   Special Requests RIGHT KNEE 1  Final   Gram Stain NO ORGANISMS SEEN NO WBC SEEN   Final   Culture   Final    NO GROWTH < 24 HOURS Performed at [facility], [address] [place], [place] [address]    Report Status PENDING  Incomplete  Aerobic/Anaerobic Culture w Gram Stain (surgical/deep wound)     Status: None (Preliminary result)   Collection Time: 07/20/22  9:42 AM   Specimen: Synovial, Right Knee; Body Fluid  Result Value Ref Range Status   Specimen Description TISSUE  Final   Special Requests RIGHT KNEE 2  Final   Gram Stain NO ORGANISMS SEEN NO WBC SEEN   Final   Culture   Final    NO GROWTH < 24 HOURS Performed at [place], [address], [place], [place] [address]    Report Status PENDING  Incomplete  Aerobic/Anaerobic Culture w Gram Stain (surgical/deep wound)     Status: None (Preliminary result)   Collection Time: 07/20/22  9:42 AM   Specimen: Synovial, Right Knee; Body Fluid  Result Value Ref Range Status   Specimen Description TISSUE  Final   Special Requests RIGHT KNEE 3  Final   Gram Stain NO ORGANISMS SEEN NO WBC SEEN   Final   Culture   Final    NO GROWTH < 24 HOURS Performed at [facility], [address] [place], [place] [address]    Report Status PENDING  Incomplete         Radiology Studies: DG Knee Right Port  Result Date: 07/20/2022 CLINICAL DATA:  Postop. EXAM: PORTABLE RIGHT KNEE - 1-2 VIEW COMPARISON:  07/18/2022 FINDINGS: Right knee arthroplasty in expected alignment. No periprosthetic lucency or fracture. Prior patellar resurfacing. Postsurgical air in the subcutaneous tissues. Diminished knee joint effusion with air in the joint space. Wound VAC overlies the anterior knee. IMPRESSION: Diminished knee joint effusion postop.  Total arthroplasty in place. Electronically Signed   By: Keith Rake M.D.   On: 07/20/2022 12:11        Scheduled Meds:  vitamin C  1,000 mg Oral Daily   Chlorhexidine Gluconate Cloth  6 each Topical Q0600   dorzolamide-timolol  1 drop Both Eyes BID   latanoprost  1 drop Both Eyes QHS   losartan  50 mg Oral QHS   multivitamin with minerals  1 tablet Oral Daily   pantoprazole  40 mg Oral Q0600   rivaroxaban  10 mg Oral Daily   [START ON 07/23/2022] rivaroxaban  20 mg Oral Daily   sodium chloride flush  3 mL Intravenous Q12H   tamsulosin  0.4 mg Oral Daily   Continuous Infusions:  penicillin G potassium 12 Million Units in dextrose 5 % 500 mL CONTINUOUS infusion 12 Million Units (07/21/22 0455)     LOS: 3 days    Time spent: 35 minutes    Irine Seal, MD [facility]   To contact the attending provider between 7A-7P  or the covering provider during after hours 7P-7A, please log into the web site [email or web address] and access using universal [place] password for that web site. If you do not have the password, please call the hospital operator.  07/21/2022, 3:33 PM

## 2022-07-21 NOTE — Progress Notes (Addendum)
PHARMACY CONSULT NOTE FOR:  OUTPATIENT  PARENTERAL ANTIBIOTIC THERAPY (OPAT)  Indication: Group A strep bacteremia/R knee PJI  Regimen: penicillin G 24 million units IV q24h as continuous infusion End date: 08/31/22  IV antibiotic discharge orders are pended. To discharging provider:  please sign these orders via discharge navigator,  Select New Orders & click on the button choice - Manage This Unsigned Work.     Thank you for allowing pharmacy to be a part of this patient's care.  Eliseo Gum, PharmD PGY1 Pharmacy Resident   07/21/2022  1:14 PM

## 2022-07-21 NOTE — Evaluation (Signed)
Physical Therapy Evaluation Patient Details Name: Caleb Barton MRN: [id number] DOB: Oct 29, 1940 Today's Date: 07/21/2022  History of Present Illness  Caleb Barton is a 82 y.o. male presenting to the ED on 07/17/2022 with sudden onset lower extremity weakness and worsening low back pain.  He did fall to ground where he stayed for 3 hours until spouse could contact son who arrived and called EMS.  Pt admitted 07/17/22 for Sepsis due to group A Streptococcus without acute organ dysfunction.  Pt s/p Right knee 1 component revision poly exchange with irrigation and debridement  by orthopedics on 07/20/22.   Pt with medical history significant of paroxysmal atrial fibrillation/LAFB/RBBB, hypertension, hyperlipidemia, OSA on CPAP, remote history of thalamic CVA, BPH, history of prostate cancer status post radiation treatment approximately a year ago being followed by [facility], Dr. Jeffie Pollock, ablation of the nerves done in his lower back by neurosurgery, Dr.Eichman approximately 2 weeks prior to this adission.  Clinical Impression  Pt admitted with above diagnosis.  Pt currently with functional limitations due to the deficits listed below (see PT Problem List). Pt will benefit from acute skilled PT to increase their independence and safety with mobility to allow discharge.  Pt assisted with mobility and requiring significant assist.  Pt unable to stand with +2 due to pain and weakness.  Pt educated on performing ankle pumps, quad sets, knee flexion (heel slides), and hip abduction while in bed a few times a day.  Pt would benefit from post acute rehab as spouse will be unable to provide assist.        Recommendations for follow up therapy are one component of a multi-disciplinary discharge planning process, led by the attending physician.  Recommendations may be updated based on patient status, additional functional criteria and insurance authorization.  Follow Up Recommendations Can patient physically be  transported by private vehicle: No     Assistance Recommended at Discharge Frequent or constant Supervision/Assistance  Patient can return home with the following  Two people to help with walking and/or transfers;A lot of help with bathing/dressing/bathroom;Assist for transportation;Help with stairs or ramp for entrance;Assistance with cooking/housework    Equipment Recommendations None recommended by PT  Recommendations for Other Services       Functional Status Assessment Patient has had a recent decline in their functional status and demonstrates the ability to make significant improvements in function in a reasonable and predictable amount of time.     Precautions / Restrictions Precautions Precautions: Fall Precaution Comments: wound vac Restrictions Weight Bearing Restrictions: No      Mobility  Bed Mobility Overal bed mobility: Needs Assistance Bed Mobility: Supine to Sit, Sit to Supine     Supine to sit: HOB elevated, Max assist Sit to supine: Mod assist, HOB elevated   General bed mobility comments: cues for technique, elevated HOB and pt required step by step cues and increased time, increased assist due to weakness and pain    Transfers Overall transfer level: Needs assistance Equipment used: Rolling walker (2 wheels) Transfers: Sit to/from Stand Sit to Stand: Total assist, +2 physical assistance, From elevated surface           General transfer comment: attempted x3 with son assisting, pt not able to extend or assist well; multimodal cues for technique even therapist demonstrated however pt not strong enough to perform today    Ambulation/Gait                  Stairs  Wheelchair Mobility    Modified Rankin (Stroke Patients Only)       Balance                                             Pertinent Vitals/Pain Pain Assessment Pain Assessment: 0-10 Pain Score: 7  Pain Location: right knee Pain  Descriptors / Indicators: Sore, Aching, Tender Pain Intervention(s): Monitored during session, Patient requesting pain meds-RN notified, Repositioned    Home Living Family/patient expects to be discharged to:: Private residence Living Arrangements: Spouse/significant other Available Help at Discharge: Family Type of Home: House Home Access: Stairs to enter Entrance Stairs-Rails: Left Entrance Stairs-Number of Steps: 5 to side door with LT rail. Front with 2 steps and RT and LT rails wide apart. Alternate Level Stairs-Number of Steps: 13 Home Layout: Two level;1/2 bath on main level Home Equipment: Tub bench;Cane - single Barista (2 wheels) Additional Comments: Adjustable bed without rails.    Prior Function Prior Level of Function : Independent/Modified Independent;Driving;History of Falls (last six months)             Mobility Comments: Typically ambulates without any AD.       Hand Dominance   Dominant Hand: Right    Extremity/Trunk Assessment   Upper Extremity Assessment Upper Extremity Assessment: Generalized weakness    Lower Extremity Assessment Lower Extremity Assessment: Generalized weakness;RLE deficits/detail RLE Deficits / Details: wound vac right knee, dressing in place, pt preferred knee extension, flexion too painful RLE: Unable to fully assess due to pain       Communication   Communication: No difficulties  Cognition Arousal/Alertness: Awake/alert Behavior During Therapy: WFL for tasks assessed/performed Overall Cognitive Status: Within Functional Limits for tasks assessed                                 General Comments: does not remember having surgery yesterday, family in room, following commands        General Comments      Exercises     Assessment/Plan    PT Assessment Patient needs continued PT services  PT Problem List Decreased strength;Decreased activity tolerance;Decreased mobility;Decreased  balance;Decreased knowledge of use of DME;Pain       PT Treatment Interventions Gait training;DME instruction;Balance training;Therapeutic exercise;Functional mobility training;Therapeutic activities;Patient/family education;Wheelchair mobility training    PT Goals (Current goals can be found in the Care Plan section)  Acute Rehab PT Goals PT Goal Formulation: With patient/family Time For Goal Achievement: 08/04/22 Potential to Achieve Goals: Good    Frequency Min 3X/week     Co-evaluation               AM-PAC PT "6 Clicks" Mobility  Outcome Measure Help needed turning from your back to your side while in a flat bed without using bedrails?: Total Help needed moving from lying on your back to sitting on the side of a flat bed without using bedrails?: Total Help needed moving to and from a bed to a chair (including a wheelchair)?: Total Help needed standing up from a chair using your arms (e.g., wheelchair or bedside chair)?: Total Help needed to walk in hospital room?: Total Help needed climbing 3-5 steps with a railing? : Total 6 Click Score: 6    End of Session Equipment Utilized During Treatment: Gait belt Activity Tolerance:  Patient limited by pain;Patient limited by fatigue Patient left: in bed;with call bell/phone within reach;with family/visitor present Nurse Communication: Mobility status PT Visit Diagnosis: Difficulty in walking, not elsewhere classified (R26.2);Muscle weakness (generalized) (M62.81)    Time: [id number] PT Time Calculation (min) (ACUTE ONLY): 34 min   Charges:   PT Evaluation $PT Eval Low Complexity: 1 Low PT Treatments $Therapeutic Activity: 8-22 mins      Arlyce Dice, DPT Physical Therapist Acute Rehabilitation Services Office: [phone]   Kati L Payson 07/21/2022, 1:21 PM

## 2022-07-21 NOTE — Plan of Care (Signed)
  Problem: Skin Integrity: Goal: Risk for impaired skin integrity will decrease Outcome: Progressing   Problem: Safety: Goal: Ability to remain free from injury will improve Outcome: Progressing   Problem: Pain Managment: Goal: General experience of comfort will improve Outcome: Progressing   Problem: Coping: Goal: Level of anxiety will decrease Outcome: Progressing   

## 2022-07-22 ENCOUNTER — Inpatient Hospital Stay (HOSPITAL_COMMUNITY): Payer: Medicare HMO

## 2022-07-22 DIAGNOSIS — M1711 Unilateral primary osteoarthritis, right knee: Secondary | ICD-10-CM | POA: Diagnosis not present

## 2022-07-22 DIAGNOSIS — R7881 Bacteremia: Secondary | ICD-10-CM | POA: Diagnosis not present

## 2022-07-22 DIAGNOSIS — J189 Pneumonia, unspecified organism: Secondary | ICD-10-CM | POA: Diagnosis not present

## 2022-07-22 DIAGNOSIS — A400 Sepsis due to streptococcus, group A: Secondary | ICD-10-CM | POA: Diagnosis not present

## 2022-07-22 LAB — CBC WITH DIFFERENTIAL/PLATELET
Abs Immature Granulocytes: 0.11 10*3/uL — ABNORMAL HIGH (ref 0.00–0.07)
Basophils Absolute: 0 10*3/uL (ref 0.0–0.1)
Basophils Relative: 0 %
Eosinophils Absolute: 0 10*3/uL (ref 0.0–0.5)
Eosinophils Relative: 0 %
HCT: 36.3 % — ABNORMAL LOW (ref 39.0–52.0)
Hemoglobin: 12 g/dL — ABNORMAL LOW (ref 13.0–17.0)
Immature Granulocytes: 1 %
Lymphocytes Relative: 10 %
Lymphs Abs: 0.8 10*3/uL (ref 0.7–4.0)
MCH: 32.5 pg (ref 26.0–34.0)
MCHC: 33.1 g/dL (ref 30.0–36.0)
MCV: 98.4 fL (ref 80.0–100.0)
Monocytes Absolute: 0.9 10*3/uL (ref 0.1–1.0)
Monocytes Relative: 11 %
Neutro Abs: 6 10*3/uL (ref 1.7–7.7)
Neutrophils Relative %: 78 %
Platelets: 160 10*3/uL (ref 150–400)
RBC: 3.69 MIL/uL — ABNORMAL LOW (ref 4.22–5.81)
RDW: 14.4 % (ref 11.5–15.5)
WBC: 7.8 10*3/uL (ref 4.0–10.5)
nRBC: 0 % (ref 0.0–0.2)

## 2022-07-22 LAB — COMPREHENSIVE METABOLIC PANEL
ALT: 97 U/L — ABNORMAL HIGH (ref 0–44)
AST: 73 U/L — ABNORMAL HIGH (ref 15–41)
Albumin: 2.6 g/dL — ABNORMAL LOW (ref 3.5–5.0)
Alkaline Phosphatase: 107 U/L (ref 38–126)
Anion gap: 7 (ref 5–15)
BUN: 23 mg/dL (ref 8–23)
CO2: 33 mmol/L — ABNORMAL HIGH (ref 22–32)
Calcium: 8.3 mg/dL — ABNORMAL LOW (ref 8.9–10.3)
Chloride: 97 mmol/L — ABNORMAL LOW (ref 98–111)
Creatinine, Ser: 0.8 mg/dL (ref 0.61–1.24)
GFR, Estimated: >60 mL/min (ref >60)
Glucose, Bld: 133 mg/dL — ABNORMAL HIGH (ref 70–99)
Potassium: 3.6 mmol/L (ref 3.5–5.1)
Sodium: 137 mmol/L (ref 135–145)
Total Bilirubin: 1.3 mg/dL — ABNORMAL HIGH (ref 0.3–1.2)
Total Protein: 5.6 g/dL — ABNORMAL LOW (ref 6.5–8.1)

## 2022-07-22 LAB — BODY FLUID CULTURE W GRAM STAIN

## 2022-07-22 LAB — MAGNESIUM: Magnesium: 2.5 mg/dL — ABNORMAL HIGH (ref 1.7–2.4)

## 2022-07-22 LAB — LEGIONELLA PNEUMOPHILA SEROGP 1 UR AG: L. pneumophila Serogp 1 Ur Ag: NEGATIVE

## 2022-07-22 MED ORDER — FUROSEMIDE 10 MG/ML IJ SOLN
20.0000 mg | Freq: Once | INTRAMUSCULAR | Status: AC
  Administered 2022-07-22: 20 mg via INTRAVENOUS
  Filled 2022-07-22: qty 2

## 2022-07-22 MED ORDER — ORAL CARE MOUTH RINSE: 15.0000 mL | OROMUCOSAL | Status: DC | PRN

## 2022-07-22 NOTE — Progress Notes (Signed)
RT note: Pt. placed on CPAP Auto at this time, RN aware, in room.

## 2022-07-22 NOTE — Progress Notes (Signed)
     Subjective: Patient doing well this morning.  IntraOp cultures with no growth yet.  Reports feeling somewhat better since surgery.  Work with therapy but limited by pain and weakness.  Hopeful for more mobilization today.  Infection disease following.  Objective:   VITALS:   Vitals:   07/22/22 0100 07/22/22 0500 07/22/22 0524 07/22/22 0525  BP: 125/78 126/81 132/86   Pulse: 68 78 71   Resp: 16 16 (!) 21   Temp:  98.4 F (36.9 C)    TempSrc:  Oral    SpO2: 94% 100% 96%   Weight:    114.9 kg  Height:        Sensation intact distally Intact pulses distally Dorsiflexion/Plantar flexion intact Incision: dressing C/D/I Compartment soft   Lab Results  Component Value Date   WBC 7.8 07/22/2022   HGB 12.0 (L) 07/22/2022   HCT 36.3 (L) 07/22/2022   MCV 98.4 07/22/2022   PLT 160 07/22/2022   BMET    Component Value Date/Time   NA 137 07/22/2022 0435   NA 143 07/27/2019 1513   K 3.6 07/22/2022 0435   CL 97 (L) 07/22/2022 0435   CO2 33 (H) 07/22/2022 0435   GLUCOSE 133 (H) 07/22/2022 0435   BUN 23 07/22/2022 0435   BUN 11 07/27/2019 1513   CREATININE 0.80 07/22/2022 0435   CALCIUM 8.3 (L) 07/22/2022 0435   GFRNONAA >60 07/22/2022 0435      Xray: X-rays right knee demonstrate no fracture description of the rash is a normalities  Assessment/Plan: 2 Days Post-Op   Principal Problem:   Sepsis due to group A Streptococcus without acute organ dysfunction Active Problems:   HTN (hypertension)   Stroke   Sleep apnea   Right knee DJD   Hypercholesteremia   Malignant neoplasm of prostate   Pneumonia   Lower extremity weakness   Lower back pain   Paroxysmal A-fib   Streptococcal bacteremia   Knee pain, right   Prosthetic joint infection  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   Follow-up IntraOp cultures. Imaging: PACU xrays DVT prophylaxis: Xarelto 10 mg postop day 1-2, resume full dose Xarelto postop day 3 Follow up: 2 weeks after surgery for a wound check with Dr. Zachery Dakins at [facility].  Address: [address] [place], [place], [place] [address]  Office Phone: [phone]   Willaim Sheng 07/22/2022, 7:14 AM   Charlies Constable, MD  Contact information:   [phone] 7am-5pm epic message Dr. Zachery Dakins, or call office for patient follow up: [phone] After hours and holidays please check [email or web address] for group call information for [facility]

## 2022-07-22 NOTE — TOC Initial Note (Signed)
Transition of Care [facility]) - Initial/Assessment Note    Patient Details  Name: Caleb Barton MRN: [id number] Date of Birth: [birth year]  [facility] CM/SW Contact:    Illene Regulus, LCSW Phone Number: 07/22/2022, 1:14 PM  Clinical Narrative:                 CSW spoke with pt about recommendations for SNF placement. Pt has agreed , and has no preference in agency. CSW explained the process. CSW to work pt up for SNF placement. TOC to follow  Expected Discharge Plan: [place] Barriers to Discharge: Continued Medical Work up   Patient Goals and CMS Choice Patient states their goals for this hospitalization and ongoing recovery are:: SNF to get stronger CMS Medicare.gov Compare Post Acute Care list provided to:: Patient Choice offered to / list presented to : Patient      Expected Discharge Plan and Services       Living arrangements for the past 2 months: Single Family Home                                      Prior Living Arrangements/Services Living arrangements for the past 2 months: Single Family Home Lives with:: Self, Spouse Patient language and need for interpreter reviewed:: Yes Do you feel safe going back to the place where you live?: Yes      Need for Family Participation in Patient Care: No (Comment) Care giver support system in place?: No (comment)   Criminal Activity/Legal Involvement Pertinent to Current Situation/Hospitalization: No - Comment as needed  Activities of Daily Living Home Assistive Devices/Equipment: None ADL Screening (condition at time of admission) Patient's cognitive ability adequate to safely complete daily activities?: Yes Is the patient deaf or have difficulty hearing?: No Does the patient have difficulty seeing, even when wearing glasses/contacts?: No Does the patient have difficulty concentrating, remembering, or making decisions?: Yes Patient able to express need for assistance with ADLs?:  Yes Does the patient have difficulty dressing or bathing?: Yes Independently performs ADLs?: Yes (appropriate for developmental age) Does the patient have difficulty walking or climbing stairs?: No Weakness of Legs: Both Weakness of Arms/Hands: Both  Permission Sought/Granted                  Emotional Assessment Appearance:: Appears stated age Attitude/Demeanor/Rapport: Gracious Affect (typically observed): Accepting Orientation: : Oriented to Self, Oriented to Place, Oriented to  Time, Oriented to Situation   Psych Involvement: No (comment)  Admission diagnosis:  Pneumonia [J18.9] Community acquired pneumonia, unspecified laterality [J18.9] Patient Active Problem List   Diagnosis Date Noted   Prosthetic joint infection 07/19/2022   Streptococcal bacteremia 07/18/2022   Sepsis due to group A Streptococcus without acute organ dysfunction 07/18/2022   Knee pain, right 07/18/2022   Pneumonia 07/17/2022   Lower extremity weakness 07/17/2022   Lower back pain 07/17/2022   Paroxysmal A-fib 07/17/2022   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 [address] Phone: [phone] Fax: [id number] [place], [place] [address] Phone: [phone] Fax: [phone]     Social Determinants of Health  (SDOH) Social History: SDOH Screenings   Food Insecurity: No Food Insecurity (07/18/2022)  Housing: Low Risk  (07/18/2022)  Transportation Needs: No Transportation Needs (07/18/2022)  Utilities: Not At Risk (07/18/2022)  Tobacco Use: Medium Risk (07/21/2022)   SDOH Interventions: Food Insecurity Interventions: Intervention Not Indicated Housing Interventions: Intervention Not Indicated Transportation Interventions: Intervention Not Indicated Utilities Interventions: Intervention Not Indicated   Readmission Risk Interventions     No data to display

## 2022-07-22 NOTE — NC FL2 (Signed)
[place] MEDICAID FL2 LEVEL OF CARE FORM     IDENTIFICATION  Patient Name: Caleb Barton Birthdate: [birth year] Sex: male Admission Date (Current Location): 07/17/2022  [facility] [place] Number:  Herbalist and Address:  [facility],  [place], [place]      Provider Number: [id number]  Attending Physician Name and Address:  Eugenie Filler, MD  Relative Name and Phone Number:  Shale, Cleavenger (Spouse) [phone] (Mobile)    Current Level of Care: Hospital Recommended Level of Care: [place] Prior Approval Number:    Date Approved/Denied:   PASRR Number: [id number]  Discharge Plan: SNF    Current Diagnoses: Patient Active Problem List   Diagnosis Date Noted   Prosthetic joint infection 07/19/2022   Streptococcal bacteremia 07/18/2022   Sepsis due to group A Streptococcus without acute organ dysfunction 07/18/2022   Knee pain, right 07/18/2022   Pneumonia 07/17/2022   Lower extremity weakness 07/17/2022   Lower back pain 07/17/2022   Paroxysmal A-fib 07/17/2022   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   Malignant neoplasm of prostate 11/01/2019   Exertional dyspnea    Abnormal nuclear stress test    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   Bilateral hearing loss 06/02/2017   Hypertension    Hypercholesteremia    Cancer    Sensorineural hearing loss (SNHL), 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   Myringitis of left ear 05/30/2015   HTN (hypertension)    Dysrhythmia    Stroke    Sleep apnea    Right knee DJD     Orientation RESPIRATION BLADDER Height & Weight     Self, Time, Situation, Place  Other (Comment) (CPAP) Continent Weight: 253 lb 3.2 oz (114.9 kg) Height:  5\' 9"  (175.3 cm)  BEHAVIORAL SYMPTOMS/MOOD NEUROLOGICAL BOWEL NUTRITION STATUS      Continent Diet (regular)  AMBULATORY STATUS COMMUNICATION OF NEEDS Skin   Limited Assist Verbally Other (Comment),  Surgical wounds (incison right knee, non pruessure wound back)                       Personal Care Assistance Level of Assistance  Bathing, Feeding, Dressing Bathing Assistance: Limited assistance Feeding assistance: Independent Dressing Assistance: Limited assistance     Functional Limitations Info  Sight, Hearing, Speech Sight Info: Adequate Hearing Info: Adequate Speech Info: Adequate    SPECIAL CARE FACTORS FREQUENCY  PT (By licensed PT), OT (By licensed OT)     PT Frequency: 5 x a week OT Frequency: 5 x a week            Contractures Contractures Info: Present    Additional Factors Info  Code Status, Allergies Code Status Info: DNR Allergies Info: Lisinopril,Sulfa Antibiotics           Current Medications (07/22/2022):  This is the current hospital active medication list Current Facility-Administered Medications  Medication Dose Route Frequency Provider Last Rate Last Admin   acetaminophen (TYLENOL) tablet 500 mg  500 mg Oral Q6H PRN Prince Rome, PA-C   [id number] mg at 07/19/22 1015   Or   acetaminophen (TYLENOL) suppository 500 mg  500 mg Rectal Q6H PRN Prince Rome, PA-C       albuterol (PROVENTIL) (2.5 MG/3ML) 0.083% nebulizer solution 2.5 mg  2.5 mg Nebulization Q2H PRN Dorise Bullion M, PA-C   2.5 mg at 07/19/22 2208   ascorbic acid (VITAMIN C) tablet 1,000 mg  1,000 mg  Oral Daily Dorise Bullion M, PA-C   [id number] mg at 07/22/22 0840   Chlorhexidine Gluconate Cloth 2 % PADS 6 each  6 each Topical [id number] Prince Rome, PA-C   6 each at 07/22/22 0534   cycloSPORINE (RESTASIS) 0.05 % ophthalmic emulsion 1 drop  1 drop Both Eyes Daily PRN Dorise Bullion M, PA-C       dorzolamide-timolol (COSOPT) 2-0.5 % ophthalmic solution 1 drop  1 drop Both Eyes BID Dorise Bullion M, [place]   1 drop at 07/22/22 0840   hydrALAZINE (APRESOLINE) injection 10 mg  10 mg Intravenous Q6H PRN Dorise Bullion M, PA-C       latanoprost (XALATAN) 0.005 %  ophthalmic solution 1 drop  1 drop Both Eyes QHS Dorise Bullion M, PA-C   1 drop at 07/21/22 2117   losartan (COZAAR) tablet 50 mg  50 mg Oral QHS Dorise Bullion M, PA-C   50 mg at 07/21/22 2117   methocarbamol (ROBAXIN) tablet 500 mg  500 mg Oral Q6H PRN Prince Rome, PA-C   [id number] mg at 07/22/22 0530   morphine (PF) 2 MG/ML injection 2 mg  2 mg Intravenous Q4H PRN Prince Rome, PA-C   2 mg at 07/20/22 [id number]   multivitamin with minerals tablet 1 tablet  1 tablet Oral Daily Prince Rome, PA-C   1 tablet at 07/22/22 0840   ondansetron (ZOFRAN) tablet 4 mg  4 mg Oral Q6H PRN Dorise Bullion M, PA-C       Or   ondansetron [facility]) injection 4 mg  4 mg Intravenous Q6H PRN Prince Rome, PA-C       Oral care mouth rinse  15 mL Mouth Rinse PRN Dorise Bullion M, PA-C       oxyCODONE (Oxy IR/ROXICODONE) immediate release tablet 5 mg  5 mg Oral Q4H PRN Prince Rome, PA-C   5 mg at 07/22/22 1014   pantoprazole (PROTONIX) EC tablet 40 mg  40 mg Oral Q0600 Eugenie Filler, MD   40 mg at 07/22/22 0530   penicillin G potassium 12 Million Units in dextrose 5 % 500 mL CONTINUOUS infusion  12 Million Units Intravenous [id number] Prince Rome, PA-C [id number] mL/hr at 07/22/22 0500 12 Million Units at 07/22/22 0500   polyethylene glycol (MIRALAX / GLYCOLAX) packet 17 g  17 g Oral Daily PRN Prince Rome, PA-C   17 g at 07/22/22 1226   [START ON 07/23/2022] rivaroxaban (XARELTO) tablet 20 mg  20 mg Oral Daily Dorise Bullion M, PA-C       sodium chloride flush (NS) 0.9 % injection 3 mL  3 mL Intravenous [id number] Dorise Bullion M, PA-C   3 mL at 07/22/22 1135   sorbitol 70 % solution 30 mL  30 mL Oral Daily PRN Dorise Bullion M, PA-C       tamsulosin [facility]) capsule 0.4 mg  0.4 mg Oral Daily Dorise Bullion M, PA-C   0.4 mg at 07/22/22 0840     Discharge Medications: Please see discharge summary for a list of discharge medications.  Relevant Imaging  Results:  Relevant Lab Results:   Additional Information [phone]  [place], LCSW

## 2022-07-22 NOTE — Progress Notes (Signed)
Peripherally Inserted Central Catheter Placement  The IV Nurse has discussed with the patient and/or persons authorized to consent for the patient, the purpose of this procedure and the potential benefits and risks involved with this procedure.  The benefits include less needle sticks, lab draws from the catheter, and the patient may be discharged home with the catheter. Risks include, but not limited to, infection, bleeding, blood clot (thrombus formation), and puncture of an artery; nerve damage and irregular heartbeat and possibility to perform a PICC exchange if needed/ordered by physician.  Alternatives to this procedure were also discussed.  Bard Power PICC patient education guide, fact sheet on infection prevention and patient information card has been provided to patient /or left at bedside.    PICC Placement Documentation  PICC Single Lumen [id number] Right Basilic 42 cm 0 cm (Active)  Indication for Insertion or Continuance of Line Home intravenous therapies (PICC only) 07/22/22 1142  Exposed Catheter (cm) 0 cm 07/22/22 1142  Site Assessment Clean, Dry, Intact 07/22/22 1142  Line Status Flushed;Blood return noted;Saline locked 07/22/22 1142  Dressing Type Transparent 07/22/22 1142  Dressing Status Antimicrobial disc in place 07/22/22 1142  Safety Lock Not Applicable [id number]  Line Care Connections checked and tightened 07/22/22 1142  Line Adjustment (NICU/IV Team Only) No 07/22/22 1142  Dressing Intervention New dressing 07/22/22 1142  Dressing Change Due 07/29/22 07/22/22 1142       Caleb Barton 07/22/2022, 11:44 AM

## 2022-07-22 NOTE — Progress Notes (Signed)
OT Cancellation Note  Patient Details Name: Caleb Barton MRN: [id number] DOB: 25-Jun-1940   Cancelled Treatment:    Reason Eval/Treat Not Completed: Patient at procedure or test/ unavailable: Pt in procedure for PICC line.  Julien Girt 07/22/2022, 1:58 PM

## 2022-07-22 NOTE — Progress Notes (Signed)
PROGRESS NOTE    Caleb Barton  [id number] DOB: Sep 28, 1940 DOA: 07/17/2022 PCP: Glenis Smoker, MD    Chief Complaint  Patient presents with   Extremity Weakness    Brief Narrative:  Patient 82 year old gentleman history of paroxysmal atrial fibrillation on anticoagulation, hypertension, hyperlipidemia, OSA on CPAP, remote history of thalamic CVA, BPH, history of prostate cancer status postradiation treatment presented to the ED with sudden onset lower extremity weakness, worsening lower back pain.  Patient's status post ablation of nerves in the lower back by neurosurgery approximately 2 weeks prior to admission.  Patient presented sudden onset lower extremity weakness, noted to be on the ground for approximately 3 hours prior to getting help and EMS being called and brought to the ED.  Patient seen in the ED MRI of the L-spine done, MRI reviewed by neurosurgery who does not feel any surgical intervention needed at this time and does not feel patient's weakness secondary to lumbar spine.  Patient noted to spike a fever in the ED, chest x-ray done concerning for bilateral lower lobe pneumonia.  Patient pancultured blood cultures positive for Streptococcus pyogenes.  Patient placed empirically on IV antibiotics.  Patient also with complaints of right knee pain, plain films of the right knee done concerning for knee effusion.  ID, orthopedics consulted.  IV antibiotics adjusted per ID.   Assessment & Plan:   Principal Problem:   Sepsis due to group A Streptococcus without acute organ dysfunction Active Problems:   Streptococcal bacteremia   HTN (hypertension)   Stroke   Sleep apnea   Right knee DJD   Hypercholesteremia   Malignant neoplasm of prostate   Pneumonia   Lower extremity weakness   Lower back pain   Paroxysmal A-fib   Knee pain, right   Prosthetic joint infection  #1 sepsis secondary to group A strep/Streptococcus pyogenes bacteremia -Patient presented with  generalized weakness, noted to have a fever in the ED, patient pancultured initial chest x-ray concerning for bibasilar pneumonia. -Blood cultures positive for Streptococcus pyogenes. -Unclear source. -MRI of the L-spine done on admission did not show any obvious evidence of abscess formation discitis. -Patient also noted with right prosthetic knee pain, plain films of the right knee done concerning for joint effusion. -Concern patient may have seeded to this joint, orthopedics consulted for possible aspiration and further evaluation. -See problem #2. -2D echo ordered with normal EF, no significant vegetations noted.   -Repeat blood cultures done with no growth to date. -ID consulted who have assessed the patient, IV antibiotics adjusted and patient now on penicillin G.   Was on linezolid which was discontinued per ID on 07/19/2022. -Per ID patient will need 6 weeks of IV penicillin G with start date 07/21/2022, per ID once IV antibiotics are completed there could be consideration for oral suppressive antibiotics which will be decided on clinic follow-up. -PICC line placed this morning.  -Patient already scheduled for ID clinic follow-up on 08/12/2022 at 3:45 PM. -Per ID. [name]  2.  Right prosthetic knee infection -Patient with complaints of sudden onset right knee pain with difficulty bending his joint since admission. -Plain films done concerning for moderate to large joint effusion. -Orthopedics consulted and patient subsequently underwent right knee aspiration with preliminary cultures positive for group A strep with sensitivities pending.  -Due to concern for prosthetic joint infection of the right knee, orthopedics recommended and washout with liner exchange which was done (07/20/2022), purulence noted in the knee joint and cultures sent and pending.  Vancomycin placed in the knee joint. -Continue IV penicillin G as recommended by ID.  -ID and orthopedics following.  3.?/Pneumonia -Patient  noted on presentation to have a fever, chest x-ray done concerning for pneumonia. -Urine Legionella antigen pending, urine pneumococcus antigen negative. -Patient denies any significant change in chronic shortness of breath, no productive cough. -Continue empiric IV antibiotics.  4.  Mild rhabdomyolysis -CK noted to be minimally elevated on presentation as patient noted to have had a fall at home and was on the floor for approximately 3 hours. -Patient aggressively hydrated with IV fluids, due to concern for shortness of breath IV fluids saline lock.   -Patient given IV Lasix with good urine output. -CK levels trending down. -Supportive care.  5.  Transaminitis -Likely secondary to rhabdomyolysis and sepsis. -Acute hepatitis panel ordered, hep B IgM antibody nonreactive, hep B core antibody IgM nonreactive, hep C antibody nonreactive, hep B surface antigen reactive.. -LFTs trending down. -Saline lock IV fluids.  Supportive care. -ID following.  6.  Paroxysmal atrial fibrillation -Currently rate controlled.   -Xarelto initially held preoperatively.   -Xarelto resumed per Ortho.    7. Bilateral lower extremity weakness/low back pain, POA -Patient with sudden onset bilateral lower extremity weakness in the early hours of this morning when he got up to go to the restroom leaving him on the ground for about 3 hours with inability to get up.  Patient denied any bowel or bladder incontinence however peed on the floor as he was unable to get up to go to the restroom.  Denies any numbness or tingling in his legs. -May be secondary to sepsis secondary to group A strep bacteremia. -CK mildly elevated on admission but trending back down. 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  L3-L4 severe left and mild to moderate right neuroforaminal narrowing which has progressed on  the right.  L4-L5 mild spinal canal stenosis and moderate bilateral neural foraminal narrowing.  L5-S1 mild right neuroforaminal narrowing.  Narrowing of the lateral recesses at L2-L3, L3-L4, L4-L5, and L5-S1 could affect the descending L3, L4, L5 and S1 nerve roots respectively.  ED physician discussed with neurosurgery who reviewed films and did not feel any surgical intervention needed at this time and recommended pain management and PT OT. -Case discussed with neurosurgery, Dr. Christella Noa who graciously agreed to see patient, reviewed films and did not feel patient's symptoms were related to any pathology in the back.  It was noted that there was no abscess or infection noted on MRI per neurosurgery, spinal cord was normal and significantly doubt if patient's symptoms are related to MRI of the L-spine. -Continue to hold statin. -Continue treatment for group A strep bacteremia. -Pain management.  PT/OT.  8.  BPH -Continue Flomax.   9.  Hyperlipidemia -Continue to hold statin in light of patient's bilateral lower extremity weakness and transaminitis. -Outpatient follow-up.  10.  Dehydration -HCTZ on hold.   -Saline lock IV fluids.    11.  Hypertension -Stable.   -Hold Cozaar.   -Status post IV Lasix every 12 hours x 3 doses. -Lasix 20 mg IV x 1.  -Continue to hold home regimen HCTZ.    12.  Sleep apnea -Continue CPAP nightly.   13.  Malignant neoplasm of the prostate -Status post radiation treatment. -Outpatient follow-up with urology.    DVT prophylaxis: Xarelto Code Status: DNR Family  Communication: Updated patient, wife at bedside. Disposition: SNF hopefully in the next 1 to 2 days.  Status is: Inpatient The patient will require care spanning > 2 midnights and should be moved to inpatient because: Severity of illness   Consultants:  ID: Dr. Juleen China 07/18/2022 Neurosurgery: Dr. Christella Noa 07/17/2022 Orthopedics: Dr. Mardelle Matte 07/18/2022  Procedures:  2D echo 07/18/2022 MRI  L-spine 07/17/2022 Chest x-ray 07/17/2022 Plain films of the right knee 07/18/2022 Right knee aspiration of synovial fluid from the joint per orthopedics: Dr. Mardelle Matte 07/18/2022 Right knee 1 component revision poly exchange with irrigation and debridement per Dr. Zachery Dakins 07/20/2022 PICC line 07/22/2022  Antimicrobials:  IV azithromycin 07/17/2022>>> 07/18/2022 IV Rocephin 07/17/2022>>>> 07/18/2022 IV Zyvox 07/18/2022>>>>> 07/19/2022 Penicillin G IV 07/18/2022 >>>>>   Subjective: Laying in bed.  Overall feeling better.  Some improvement with generalized weakness.  No chest pain.  No significant shortness of breath.  Noted to be on O2.  Wife at bedside.   Objective: Vitals:   07/22/22 0500 07/22/22 0524 07/22/22 0525 07/22/22 1200  BP: 126/81 132/86    Pulse: 78 71    Resp: 16 (!) 21  20  Temp: 98.4 F (36.9 C)   98.2 F (36.8 C)  TempSrc: Oral   Oral  SpO2: 100% 96%    Weight:   114.9 kg   Height:        Intake/Output Summary (Last 24 hours) at 07/22/2022 2107 Last data filed at 07/22/2022 2030 Gross per 24 hour  Intake 1554 ml  Output 1700 ml  Net -146 ml    Filed Weights   07/20/22 0500 07/21/22 0500 07/22/22 0525  Weight: 116.5 kg 114.4 kg 114.9 kg    Examination:  General exam: NAD. Respiratory system: Minimal bibasilar crackles in the right base otherwise clear.  No wheezing.  Fair air movement.  Speaking in full sentences.  Cardiovascular system: Irregularly irregular.  No JVD.  Trace left lower extremity edema.  Right lower extremity in postop dressing.   Gastrointestinal system: Abdomen is soft, nontender, nondistended, positive bowel sounds.  No rebound.  No guarding.  Central nervous system: Alert and oriented. No focal neurological deficits. Extremities: Right lower extremity wrapped in postop bandage. 3-4/5 left lower extremity strength. Skin: No rashes, lesions or ulcers Psychiatry: Judgement and insight appear normal. Mood & affect appropriate.     Data  Reviewed: I have personally reviewed following labs and imaging studies  CBC: Recent Labs  Lab 07/18/22 0341 07/19/22 0451 07/20/22 0409 07/21/22 0415 07/22/22 0435  WBC 12.6* 11.8* 9.5 11.0* 7.8  NEUTROABS 11.2* 10.3* 8.1* 9.4* 6.0  HGB 14.4 12.6* 12.3* 11.8* 12.0*  HCT 42.8 38.2* 36.2* 35.5* 36.3*  MCV 97.9 97.4 96.3 98.6 98.4  PLT 124* 101* 108* 127* 160     Basic Metabolic Panel: Recent Labs  Lab 07/18/22 0340 07/18/22 0341 07/19/22 0451 07/20/22 0409 07/21/22 0415 07/22/22 0435  NA  --  134* 131* 129* 135 137  K  --  3.1* 3.7 3.7 3.9 3.6  CL  --  97* 99 98 98 97*  CO2  --  27 24 24 30  33*  GLUCOSE  --  110* 127* 129* 158* 133*  BUN  --  19 21 21 23 23   CREATININE  --  1.13 0.76 0.69 0.79 0.80  CALCIUM  --  8.3* 8.1* 8.3* 8.4* 8.3*  MG 2.1  --  2.2 2.3 2.3 2.5*     GFR: Estimated Creatinine Clearance: 93.6 mL/min (by C-G formula based on SCr  of 0.8 mg/dL).  Liver Function Tests: Recent Labs  Lab 07/18/22 0341 07/19/22 0451 07/20/22 0409 07/21/22 0415 07/22/22 0435  AST 157* 111* 89* 56* 73*  ALT 98* 87* 83* 72* 97*  ALKPHOS 57 66 92 93 107  BILITOT 3.2* 3.4* 2.8* 1.7* 1.3*  PROT 6.0* 5.8* 5.8* 5.6* 5.6*  ALBUMIN 3.3* 3.3* 3.0* 2.5* 2.6*     CBG: Recent Labs  Lab 07/19/22 0751 07/20/22 0428 07/20/22 0735 07/21/22 0807  GLUCAP 123* 119* 121* 130*      Recent Results (from the past 240 hour(s))  SARS Coronavirus 2 by RT PCR (hospital order, performed in [facility] hospital lab) *cepheid single result test* Anterior Nasal Swab     Status: None   Collection Time: 07/17/22  3:13 PM   Specimen: Anterior Nasal Swab  Result Value Ref Range Status   SARS Coronavirus 2 by RT PCR NEGATIVE NEGATIVE Final    Comment: (NOTE) SARS-CoV-2 target nucleic acids are NOT DETECTED.  The SARS-CoV-2 RNA is generally detectable in upper and lower respiratory specimens during the acute phase of infection. The lowest concentration of SARS-CoV-2 viral copies  this assay can detect is 250 copies / mL. A negative result does not preclude SARS-CoV-2 infection and should not be used as the sole basis for treatment or other patient management decisions.  A negative result may occur with improper specimen collection / handling, submission of specimen other than nasopharyngeal swab, presence of viral mutation(s) within the areas targeted by this assay, and inadequate number of viral copies (<250 copies / mL). A negative result must be combined with clinical observations, patient history, and epidemiological information.  Fact Sheet for Patients:   https://www.patel.info/  Fact Sheet for Healthcare Providers: https://hall.com/  This test is not yet approved or  cleared by the [place] FDA and has been authorized for detection and/or diagnosis of SARS-CoV-2 by FDA under an Emergency Use Authorization (EUA).  This EUA will remain in effect (meaning this test can be used) for the duration of the COVID-19 declaration under Section 564(b)(1) of the Act, 21 U.S.C. section 360bbb-3(b)(1), unless the authorization is terminated or revoked sooner.  Performed at [facility], [place] [address] [place], [place] [address]   Culture, blood (Routine X 2) w Reflex to ID Panel     Status: Abnormal   Collection Time: 07/17/22  3:31 PM   Specimen: Left Antecubital; Blood  Result Value Ref Range Status   Specimen Description   Final    LEFT ANTECUBITAL BLOOD Performed at [facility], [place] [address] [place], [place] [address]    Special Requests   Final    BOTTLES DRAWN AEROBIC AND ANAEROBIC Blood Culture adequate volume Performed at [place] [address] [place], [place] [address]    Culture  Setup Time   Final    GRAM POSITIVE COCCI IN CHAINS IN BOTH AEROBIC AND ANAEROBIC BOTTLES CRITICAL RESULT CALLED TO, READ BACK BY AND VERIFIED WITH: PHARMD A Buford Dresser  CH:[id number] AT 38 BY CM    Culture (A)  Final    GROUP A STREP (S.PYOGENES) ISOLATED HEALTH DEPARTMENT NOTIFIED Performed at [facility], [place] [address], [place], [place] [address]    Report Status 07/21/2022 FINAL  Final   Organism ID, Bacteria GROUP A STREP (S.PYOGENES) ISOLATED  Final      Susceptibility   Group a strep (s.pyogenes) isolated - MIC*    PENICILLIN <=0.06 SENSITIVE Sensitive  CEFTRIAXONE <=0.12 SENSITIVE Sensitive     ERYTHROMYCIN <=0.12 SENSITIVE Sensitive     LEVOFLOXACIN 0.5 SENSITIVE Sensitive     VANCOMYCIN 0.25 SENSITIVE Sensitive     * GROUP A STREP (S.PYOGENES) ISOLATED  Blood Culture ID Panel (Reflexed)     Status: Abnormal   Collection Time: 07/17/22  3:31 PM  Result Value Ref Range Status   Enterococcus faecalis NOT DETECTED NOT DETECTED Final   Enterococcus Faecium NOT DETECTED NOT DETECTED Final   Listeria monocytogenes NOT DETECTED NOT DETECTED Final   Staphylococcus species NOT DETECTED NOT DETECTED Final   Staphylococcus aureus (BCID) NOT DETECTED NOT DETECTED Final   Staphylococcus epidermidis NOT DETECTED NOT DETECTED Final   Staphylococcus lugdunensis NOT DETECTED NOT DETECTED Final   Streptococcus species DETECTED (A) NOT DETECTED Final    Comment: CRITICAL RESULT CALLED TO, READ BACK BY AND VERIFIED WITH: PHARMD A E[place]INGTON [id number] AT 815 AM BY CM    Streptococcus agalactiae NOT DETECTED NOT DETECTED Final   Streptococcus pneumoniae NOT DETECTED NOT DETECTED Final   Streptococcus pyogenes DETECTED (A) NOT DETECTED Final    Comment: CRITICAL RESULT CALLED TO, READ BACK BY AND VERIFIED WITH: PHARMD A E[place]INGTON CH:[id number] AT 815 AM BY CM    A.calcoaceticus-baumannii NOT DETECTED NOT DETECTED Final   Bacteroides fragilis NOT DETECTED NOT DETECTED Final   Enterobacterales NOT DETECTED NOT DETECTED Final   Enterobacter cloacae complex NOT DETECTED NOT DETECTED Final   Escherichia coli NOT DETECTED NOT DETECTED Final   Klebsiella aerogenes NOT  DETECTED NOT DETECTED Final   Klebsiella oxytoca NOT DETECTED NOT DETECTED Final   Klebsiella pneumoniae NOT DETECTED NOT DETECTED Final   Proteus species NOT DETECTED NOT DETECTED Final   Salmonella species NOT DETECTED NOT DETECTED Final   Serratia marcescens NOT DETECTED NOT DETECTED Final   Haemophilus influenzae NOT DETECTED NOT DETECTED Final   Neisseria meningitidis NOT DETECTED NOT DETECTED Final   Pseudomonas aeruginosa NOT DETECTED NOT DETECTED Final   Stenotrophomonas maltophilia NOT DETECTED NOT DETECTED Final   Candida albicans NOT DETECTED NOT DETECTED Final   Candida auris NOT DETECTED NOT DETECTED Final   Candida glabrata NOT DETECTED NOT DETECTED Final   Candida krusei NOT DETECTED NOT DETECTED Final   Candida parapsilosis NOT DETECTED NOT DETECTED Final   Candida tropicalis NOT DETECTED NOT DETECTED Final   Cryptococcus neoformans/gattii NOT DETECTED NOT DETECTED Final    Comment: Performed at [facility], [address] [place], [place] [address]  Culture, blood (Routine X 2) w Reflex to ID Panel     Status: Abnormal   Collection Time: 07/17/22  3:43 PM   Specimen: Right Antecubital; Blood  Result Value Ref Range Status   Specimen Description   Final    RIGHT ANTECUBITAL BLOOD Performed at [facility], [place] [address] [place], [place] [address]    Special Requests   Final    BOTTLES DRAWN AEROBIC AND ANAEROBIC Blood Culture adequate volume Performed at [place] [address], [place], [place] [address]    Culture  Setup Time   Final    GRAM POSITIVE COCCI IN CHAINS IN BOTH AEROBIC AND ANAEROBIC BOTTLES CRITICAL VALUE NOTED.  VALUE IS CONSISTENT WITH PREVIOUSLY REPORTED AND CALLED VALUE.    Culture (A)  Final    GROUP A STREP (S.PYOGENES) ISOLATED SUSCEPTIBILITIES PERFORMED ON PREVIOUS CULTURE WITHIN THE LAST 5 DAYS. Performed at [facility], [place] [address], [place], [place] [address]  Report Status 07/21/2022  FINAL  Final  Body fluid culture w Gram Stain     Status: None   Collection Time: 07/18/22  2:23 PM   Specimen: Synovium; Synovial Fluid  Result Value Ref Range Status   Specimen Description   Final    SYNOVIAL R KNEE Performed at [place] [address], [place], [place] [address]    Special Requests   Final    NONE Performed at [facility], [place] [address], [place], [place] [address]    Gram Stain   Final    MODERATE WBC PRESENT, PREDOMINANTLY PMN RARE GRAM POSITIVE COCCI IN CHAINS INTRACELLULAR CRITICAL RESULT CALLED TO, READ BACK BY AND VERIFIED WITH: RN REBECCA FEWELL ON 07/18/22 @ 2152 BY DRT    Culture   Final    RARE GROUP A STREP (S.PYOGENES) ISOLATED SUSCEPTIBILITIES PERFORMED ON PREVIOUS CULTURE WITHIN THE LAST 5 DAYS. Performed at [facility], [place] [address], [place], [place] [address]    Report Status 07/22/2022 FINAL  Final  Anaerobic culture w Gram Stain     Status: None (Preliminary result)   Collection Time: 07/18/22  2:23 PM   Specimen: Synovium; Synovial Fluid  Result Value Ref Range Status   Specimen Description   Final    SYNOVIAL R KNEE Performed at [place] [address], [place], [place] [address]    Special Requests   Final    NONE Performed at [facility], [place] [address] [place], [place] [address]    Gram Stain PENDING  Incomplete   Culture   Final    NO ANAEROBES ISOLATED; CULTURE IN PROGRESS FOR 5 DAYS   Report Status PENDING  Incomplete  Culture, blood (Routine X 2) w Reflex to ID Panel     Status: None (Preliminary result)   Collection Time: 07/19/22  4:49 AM   Specimen: BLOOD LEFT HAND  Result Value Ref Range Status   Specimen Description   Final    BLOOD LEFT HAND Performed at [facility], [place] [address] [place], [place] [address]    Special Requests   Final    BOTTLES DRAWN AEROBIC ONLY Blood Culture adequate volume Performed at  [place] [address] [place], [place] [address]    Culture   Final    NO GROWTH 3 DAYS Performed at [facility], [place] [address], [place], [place] [address]    Report Status PENDING  Incomplete  Culture, blood (Routine X 2) w Reflex to ID Panel     Status: None (Preliminary result)   Collection Time: 07/19/22  4:51 AM   Specimen: BLOOD RIGHT HAND  Result Value Ref Range Status   Specimen Description   Final    BLOOD RIGHT HAND Performed at [facility], [place] [address], [place], [place] [address]    Special Requests   Final    BOTTLES DRAWN AEROBIC AND ANAEROBIC Blood Culture adequate volume Performed at [place] [address], [place], [place] [address]    Culture   Final    NO GROWTH 3 DAYS Performed at [facility], [place] [address], [place], [place] [address]    Report Status PENDING  Incomplete  Aerobic/Anaerobic Culture w Gram Stain (surgical/deep wound)     Status: None (Preliminary result)   Collection Time: 07/20/22  9:41 AM   Specimen: Synovial, Right Knee; Body Fluid  Result Value  Ref Range Status   Specimen Description TISSUE  Final   Special Requests RIGHT KNEE 1  Final   Gram Stain NO ORGANISMS SEEN NO WBC SEEN   Final   Culture   Final    NO GROWTH 2 DAYS Performed at [facility], [address] [place], [place] [address]    Report Status PENDING  Incomplete  Aerobic/Anaerobic Culture w Gram Stain (surgical/deep wound)     Status: None (Preliminary result)   Collection Time: 07/20/22  9:42 AM   Specimen: Synovial, Right Knee; Body Fluid  Result Value Ref Range Status   Specimen Description TISSUE  Final   Special Requests RIGHT KNEE 2  Final   Gram Stain NO ORGANISMS SEEN NO WBC SEEN   Final   Culture   Final    NO GROWTH 2 DAYS Performed at [facility], [address], [place], [place] [address]    Report Status PENDING  Incomplete  Aerobic/Anaerobic Culture w Gram  Stain (surgical/deep wound)     Status: None (Preliminary result)   Collection Time: 07/20/22  9:42 AM   Specimen: Synovial, Right Knee; Body Fluid  Result Value Ref Range Status   Specimen Description TISSUE  Final   Special Requests RIGHT KNEE 3  Final   Gram Stain NO ORGANISMS SEEN NO WBC SEEN   Final   Culture   Final    NO GROWTH 2 DAYS Performed at [facility], [address], [place], [place] [address]    Report Status PENDING  Incomplete         Radiology Studies: DG CHEST PORT 1 VIEW  Result Date: 07/22/2022 CLINICAL DATA:  PICC line placement EXAM: PORTABLE CHEST 1 VIEW COMPARISON:  Portable exam 1207 hours compared to 07/17/2022 FINDINGS: RIGHT arm PICC line tip projects over SVC. Upper normal size of cardiac silhouette. Mediastinal contours and pulmonary vascularity normal. Atherosclerotic calcification aorta. Minimal bibasilar atelectasis. Lungs otherwise clear. No pleural effusion or pneumothorax. IMPRESSION: Tip of RIGHT arm PICC line projects over SVC. Bibasilar atelectasis. 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 Electronically Signed   By: Lavonia Dana M.D.   On: 07/22/2022 12:38   [place] EKG SITE RITE  Result Date: 07/21/2022 If Site Rite image not attached, placement could not be confirmed due to current cardiac rhythm.       Scheduled Meds:  vitamin C  1,000 mg Oral Daily   Chlorhexidine Gluconate Cloth  6 each Topical Q0600   dorzolamide-timolol  1 drop Both Eyes BID   latanoprost  1 drop Both Eyes QHS   losartan  50 mg Oral QHS   multivitamin with minerals  1 tablet Oral Daily   pantoprazole  40 mg Oral Q0600   [START ON 07/23/2022] rivaroxaban  20 mg Oral Daily   sodium chloride flush  3 mL Intravenous Q12H   tamsulosin  0.4 mg Oral Daily   Continuous Infusions:  penicillin G potassium 12 Million Units in dextrose 5 % 500 mL CONTINUOUS infusion 12 Million Units (07/22/22 1630)     LOS: 4 days    Time spent: 35 minutes    Irine Seal,  MD [facility]   To contact the attending provider between 7A-7P or the covering provider during after hours 7P-7A, please log into the web site [email or web address] and access using universal  password for that web site. If you do not have the password, please call the hospital operator.  07/22/2022, 9:07 PM

## 2022-07-23 DIAGNOSIS — M25561 Pain in right knee: Secondary | ICD-10-CM | POA: Diagnosis not present

## 2022-07-23 DIAGNOSIS — C61 Malignant neoplasm of prostate: Secondary | ICD-10-CM | POA: Diagnosis not present

## 2022-07-23 DIAGNOSIS — I10 Essential (primary) hypertension: Secondary | ICD-10-CM | POA: Diagnosis not present

## 2022-07-23 DIAGNOSIS — A400 Sepsis due to streptococcus, group A: Secondary | ICD-10-CM | POA: Diagnosis not present

## 2022-07-23 LAB — CBC WITH DIFFERENTIAL/PLATELET
Abs Immature Granulocytes: 0.22 10*3/uL — ABNORMAL HIGH (ref 0.00–0.07)
Basophils Absolute: 0 10*3/uL (ref 0.0–0.1)
Basophils Relative: 1 %
Eosinophils Absolute: 0.1 10*3/uL (ref 0.0–0.5)
Eosinophils Relative: 1 %
HCT: 37.7 % — ABNORMAL LOW (ref 39.0–52.0)
Hemoglobin: 12.5 g/dL — ABNORMAL LOW (ref 13.0–17.0)
Immature Granulocytes: 3 %
Lymphocytes Relative: 14 %
Lymphs Abs: 0.9 10*3/uL (ref 0.7–4.0)
MCH: 32.2 pg (ref 26.0–34.0)
MCHC: 33.2 g/dL (ref 30.0–36.0)
MCV: 97.2 fL (ref 80.0–100.0)
Monocytes Absolute: 0.8 10*3/uL (ref 0.1–1.0)
Monocytes Relative: 13 %
Neutro Abs: 4.3 10*3/uL (ref 1.7–7.7)
Neutrophils Relative %: 68 %
Platelets: 217 10*3/uL (ref 150–400)
RBC: 3.88 MIL/uL — ABNORMAL LOW (ref 4.22–5.81)
RDW: 14.4 % (ref 11.5–15.5)
WBC: 6.4 10*3/uL (ref 4.0–10.5)
nRBC: 0 % (ref 0.0–0.2)

## 2022-07-23 LAB — COMPREHENSIVE METABOLIC PANEL
ALT: 101 U/L — ABNORMAL HIGH (ref 0–44)
AST: 61 U/L — ABNORMAL HIGH (ref 15–41)
Albumin: 2.5 g/dL — ABNORMAL LOW (ref 3.5–5.0)
Alkaline Phosphatase: 125 U/L (ref 38–126)
Anion gap: 8 (ref 5–15)
BUN: 17 mg/dL (ref 8–23)
CO2: 32 mmol/L (ref 22–32)
Calcium: 8.1 mg/dL — ABNORMAL LOW (ref 8.9–10.3)
Chloride: 97 mmol/L — ABNORMAL LOW (ref 98–111)
Creatinine, Ser: 0.66 mg/dL (ref 0.61–1.24)
GFR, Estimated: >60 mL/min (ref >60)
Glucose, Bld: 118 mg/dL — ABNORMAL HIGH (ref 70–99)
Potassium: 3.5 mmol/L (ref 3.5–5.1)
Sodium: 137 mmol/L (ref 135–145)
Total Bilirubin: 1.6 mg/dL — ABNORMAL HIGH (ref 0.3–1.2)
Total Protein: 5.9 g/dL — ABNORMAL LOW (ref 6.5–8.1)

## 2022-07-23 LAB — ANAEROBIC CULTURE W GRAM STAIN

## 2022-07-23 MED ORDER — POLYETHYLENE GLYCOL 3350 17 G PO PACK
17.0000 g | PACK | Freq: Two times a day (BID) | ORAL | Status: AC
  Administered 2022-07-23 (×2): 17 g via ORAL
  Filled 2022-07-23 (×3): qty 1

## 2022-07-23 MED ORDER — DIPHENHYDRAMINE-ZINC ACETATE 2-0.1 % EX CREA
TOPICAL_CREAM | Freq: Two times a day (BID) | CUTANEOUS | Status: DC | PRN
  Filled 2022-07-23: qty 28

## 2022-07-23 MED ORDER — SIMETHICONE 80 MG PO CHEW
80.0000 mg | CHEWABLE_TABLET | Freq: Once | ORAL | Status: AC
  Administered 2022-07-23: 80 mg via ORAL
  Filled 2022-07-23: qty 1

## 2022-07-23 MED ORDER — ORAL CARE MOUTH RINSE: 15.0000 mL | OROMUCOSAL | Status: DC | PRN

## 2022-07-23 MED ORDER — AMLODIPINE BESYLATE 5 MG PO TABS
2.5000 mg | ORAL_TABLET | Freq: Every day | ORAL | Status: DC
  Administered 2022-07-23: 2.5 mg via ORAL
  Filled 2022-07-23: qty 1

## 2022-07-23 MED ORDER — SIMETHICONE 80 MG PO CHEW
80.0000 mg | CHEWABLE_TABLET | Freq: Four times a day (QID) | ORAL | Status: DC | PRN
  Administered 2022-07-23: 80 mg via ORAL
  Filled 2022-07-23: qty 1

## 2022-07-23 NOTE — TOC Progression Note (Signed)
Transition of Care [facility]) - Progression Note    Patient Details  Name: Caleb Barton MRN: [id number] Date of Birth: 03-08-1941  Transition of Care [facility]) CM/SW [place], LCSW Phone Number: 07/23/2022, 3:40 PM  Clinical Narrative:    CSW presented bed offers to pt and pt's wife, they are requesting time to review. TOC to follow.    Expected Discharge Plan: [place] Barriers to Discharge: Continued Medical Work up  Expected Discharge Plan and Services       Living arrangements for the past 2 months: Single Family Home                                       Social Determinants of Health (SDOH) Interventions SDOH Screenings   Food Insecurity: No Food Insecurity (07/18/2022)  Housing: Low Risk  (07/18/2022)  Transportation Needs: No Transportation Needs (07/18/2022)  Utilities: Not At Risk (07/18/2022)  Tobacco Use: Medium Risk (07/21/2022)    Readmission Risk Interventions     No data to display

## 2022-07-23 NOTE — Progress Notes (Signed)
PT Cancellation Note  Patient Details Name: Caleb Barton MRN: [id number] DOB: 09-01-1940   Cancelled Treatment:    Reason Eval/Treat Not Completed: Other (comment). Pt sleeping, easily awakened, pleasant. Pt thinks it is 2:20 in the morning, not the afternoon, reports being very worn out from OT and enema with nursing earlier; spouse at bedside reports pt has been busy today. Will check back for PT tomorrow.    Tori Evalin Shawhan PT, DPT 07/23/22, 2:25 PM

## 2022-07-23 NOTE — Progress Notes (Signed)
[facility] PROGRESS NOTE    Progress Note  Caleb Barton  [id number] DOB: February 23, 1941 DOA: 07/17/2022 PCP: Glenis Smoker, MD     Brief Narrative:   Caleb Barton is an 82 y.o. male past medical history of paroxysmal atrial fibrillation on anticoagulation, history of CVA, prostate cancer status post radiation treatment comes in with sudden onset of lower extremity weakness and worsening back pain.  He status post ablation 2 weeks ago by neurosurgery for back pain, he reports sudden onset of lower extremity weakness approximately 3 hours prior to admission.  MRI of the lumbar spine reviewed by neurosurgery who feels he does not need any surgical intervention at this time and feels patient's weakness is not due to the lumbar spine, he was noted to spike a fever in the ED chest x-ray concerning for bilateral infiltrates cultures grew strep pyogenes, he was placed empirically on antibiotics, knee x-ray showed an effusion ID and orthopedic surgery was consulted.  Assessment/Plan:   Sepsis due to group A Streptococcus without acute organ dysfunction Blood cultures were positive for strep pyogenes of unclear source. MRI of the lumbar spine did not show any evidence of abscess. Orthopedic surgery was consulted as the patient has a prosthetic knee and there was a concern for seeding of the joint who underwent knee aspiration sublobar further details. 2D echo showed no vegetation, repeated blood cultures remain negative till date. ID recommended IV penicillin previously on linezolid which was discontinued on 07/19/2022. ID recommended 6 weeks of IV antibiotics with start date on 07/21/2022, once antibiotics are completed IV ID will follow-up and will decide on suppressive therapy. PICC line was placed on 07/22/2022. Scheduled for ID follow-up on 08/12/2022.  Right prosthetic knee infection: Orthopedic surgery was consulted underwent aspiration cultures grew strep a. Orthopedic surgery  recommended a washout with liner exchange done on 07/20/2022. Cultures and sensitivities are back, ID recommended IV penicillins for 6 weeks. Right knee is wrapped currently with wound VAC. Follow-up with orthopedic and ID as an outpatient.  Questionable pneumonia: Has completed course of antibiotics in house.  Mild rhabdomyolysis: Probably secondary to fall resolved with IV fluids.  Transaminitis: Likely due to rhabdomyolysis acute hepatitis panel has been nonreactive. LFTs are slowly trending down.  Paroxysmal atrial fibrillation: Rate controlled continue Xarelto.  Bilateral lower extremity weakness/low back pain present on admission: Probably secondary to sepsis. MRI of the L-spine was done that showed no acute findings EDP discussed with neurosurgery did not feel any surgical intervention was needed and also recommended PT OT. The case was discussed with Dr. Joaquim Lai who graciously agreed to see the patient and reviewed the film recommended no further intervention. Statins were held on admission. PT OT evaluated the patient awaiting skilled nursing facility placement.  BPH: Continue Flomax.  Hyperlipidemia: Holding statin due to rhabdomyolysis and lower extremity weakness can be resumed as an outpatient by PCP.  Dehydration: Appears euvolemic continue to hold hydrochlorothiazide.  Essential hypertension: Blood pressure has been slowly trending up.  He has been getting IV Lasix as he appears fluid overloaded. Cozaar has been resumed we will start low-dose Norvasc. Continue to monitor blood pressure closely may need to start hydrochlorothiazide.   DVT prophylaxis: lovenox Family Communication:none Status is: Inpatient Remains inpatient appropriate because: Strep infection will need skilled nursing facility placement.    Code Status:     Code Status Orders  (From admission, onward)           Start     Ordered  07/17/22 1805  Do not attempt resuscitation  (DNR)  Continuous       Question Answer Comment  If patient has no pulse and is not breathing Do Not Attempt Resuscitation   If patient has a pulse and/or is breathing: Medical Treatment Goals LIMITED ADDITIONAL INTERVENTIONS: Use medication/IV fluids and cardiac monitoring as indicated; Do not use intubation or mechanical ventilation (DNI), also provide comfort medications.  Transfer to Progressive/Stepdown as indicated, avoid Intensive Care.   Consent: Discussion documented in EHR or advanced directives reviewed      07/17/22 1814           Code Status History     Date Active Date Inactive Code Status Order ID Comments User Context   07/01/2021 1417 07/02/2021 1646 Full Code [id number]  Willaim Sheng, MD Inpatient   07/29/2019 1211 07/29/2019 2032 Full Code [id number]  Wellington Hampshire, MD Inpatient   02/09/2012 1136 02/10/2012 1714 Full Code [id number]  Glenford Bayley., RN Inpatient      Advance Directive Documentation    Flowsheet Row Most Recent Value  Type of Advance Directive Living will  Pre-existing out of facility DNR order (yellow form or pink MOST form) --  "MOST" Form in Place? --         IV Access:   Peripheral IV   Procedures and diagnostic studies:   DG CHEST PORT 1 VIEW  Result Date: 07/22/2022 CLINICAL DATA:  PICC line placement EXAM: PORTABLE CHEST 1 VIEW COMPARISON:  Portable exam 1207 hours compared to 07/17/2022 FINDINGS: RIGHT arm PICC line tip projects over SVC. Upper normal size of cardiac silhouette. Mediastinal contours and pulmonary vascularity normal. Atherosclerotic calcification aorta. Minimal bibasilar atelectasis. Lungs otherwise clear. No pleural effusion or pneumothorax. IMPRESSION: Tip of RIGHT arm PICC line projects over SVC. Bibasilar atelectasis. 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 Electronically Signed   By: Lavonia Dana M.D.   On: 07/22/2022 12:38   [place] EKG SITE RITE  Result Date: 07/21/2022 If Site Rite image not  attached, placement could not be confirmed due to current cardiac rhythm.    Medical Consultants:   None.   Subjective:    Caleb Barton relates he is having crampy abdominal pain needs to have a bowel movement.  Objective:    Vitals:   07/22/22 0525 07/22/22 1200 07/22/22 2151 07/23/22 0500  BP:   (!) 143/80   Pulse:      Resp:  20    Temp:  98.2 F (36.8 C) 98.7 F (37.1 C)   TempSrc:  Oral Oral   SpO2:      Weight: 114.9 kg   114.3 kg  Height:       SpO2: 96 % O2 Flow Rate (L/min): 3 L/min   Intake/Output Summary (Last 24 hours) at 07/23/2022 0727 Last data filed at 07/23/2022 0604 Gross per 24 hour  Intake 999.62 ml  Output 3300 ml  Net -2300.38 ml   Filed Weights   07/21/22 0500 07/22/22 0525 07/23/22 0500  Weight: 114.4 kg 114.9 kg 114.3 kg    Exam: General exam: In no acute distress. Respiratory system: Good air movement and clear to auscultation. Cardiovascular system: S1 & S2 heard, RRR. No JVD. Gastrointestinal system: Abdomen is nondistended, soft and nontender.  Extremities: Right knee is wrapped with a wound VAC Skin: No rashes, lesions or ulcers Psychiatry: Judgement and insight appear normal. Mood & affect appropriate.    Data Reviewed:    Labs: Basic Metabolic Panel: Recent Labs  Lab  07/18/22 0340 07/18/22 0341 07/19/22 0451 07/20/22 0409 07/21/22 0415 07/22/22 0435 07/23/22 0429  NA  --    < > 131* 129* 135 137 137  K  --    < > 3.7 3.7 3.9 3.6 3.5  CL  --    < > 99 98 98 97* 97*  CO2  --    < > 24 24 30  33* 32  GLUCOSE  --    < > 127* 129* 158* 133* 118*  BUN  --    < > 21 21 23 23 17   CREATININE  --    < > 0.76 0.69 0.79 0.80 0.66  CALCIUM  --    < > 8.1* 8.3* 8.4* 8.3* 8.1*  MG 2.1  --  2.2 2.3 2.3 2.5*  --    < > = values in this interval not displayed.   GFR Estimated Creatinine Clearance: 93.3 mL/min (by C-G formula based on SCr of 0.66 mg/dL). Liver Function Tests: Recent Labs  Lab 07/19/22 0451 07/20/22 0409  07/21/22 0415 07/22/22 0435 07/23/22 0429  AST 111* 89* 56* 73* 61*  ALT 87* 83* 72* 97* 101*  ALKPHOS 66 92 93 107 125  BILITOT 3.4* 2.8* 1.7* 1.3* 1.6*  PROT 5.8* 5.8* 5.6* 5.6* 5.9*  ALBUMIN 3.3* 3.0* 2.5* 2.6* 2.5*   No results for input(s): "LIPASE", "AMYLASE" in the last 168 hours. No results for input(s): "AMMONIA" in the last 168 hours. Coagulation profile Recent Labs  Lab 07/18/22 0341 07/18/22 1759 07/19/22 0726 07/20/22 0409  INR 3.4* 2.2* 1.8* 1.2   COVID-19 Labs  No results for input(s): "DDIMER", "FERRITIN", "LDH", "CRP" in the last 72 hours.  Lab Results  Component Value Date   SARSCOV2NAA NEGATIVE 07/17/2022   [place] NEGATIVE 06/27/2021   [place] NEGATIVE 07/27/2019    CBC: Recent Labs  Lab 07/19/22 0451 07/20/22 0409 07/21/22 0415 07/22/22 0435 07/23/22 0429  WBC 11.8* 9.5 11.0* 7.8 6.4  NEUTROABS 10.3* 8.1* 9.4* 6.0 4.3  HGB 12.6* 12.3* 11.8* 12.0* 12.5*  HCT 38.2* 36.2* 35.5* 36.3* 37.7*  MCV 97.4 96.3 98.6 98.4 97.2  PLT 101* 108* 127* 160 217   Cardiac Enzymes: Recent Labs  Lab 07/17/22 1950 07/18/22 0341 07/19/22 0451 07/20/22 0409  CKTOTAL 3,833* 3,438* 1,931* 979*   BNP (last 3 results) No results for input(s): "PROBNP" in the last 8760 hours. CBG: Recent Labs  Lab 07/19/22 0751 07/20/22 0428 07/20/22 0735 07/21/22 0807  GLUCAP 123* 119* 121* 130*   D-Dimer: No results for input(s): "DDIMER" in the last 72 hours. Hgb A1c: No results for input(s): "HGBA1C" in the last 72 hours. Lipid Profile: No results for input(s): "CHOL", "HDL", "LDLCALC", "TRIG", "CHOLHDL", "LDLDIRECT" in the last 72 hours. Thyroid function studies: No results for input(s): "TSH", "T4TOTAL", "T3FREE", "THYROIDAB" in the last 72 hours.  Invalid input(s): "FREET3" Anemia work up: No results for input(s): "VITAMINB12", "FOLATE", "FERRITIN", "TIBC", "IRON", "RETICCTPCT" in the last 72 hours. Sepsis Labs: Recent Labs  Lab 07/17/22 1543  07/17/22 1730 07/18/22 0341 07/20/22 0409 07/21/22 0415 07/22/22 0435 07/23/22 0429  WBC  --   --    < > 9.5 11.0* 7.8 6.4  LATICACIDVEN 2.3* 1.8  --   --   --   --   --    < > = values in this interval not displayed.   Microbiology Recent Results (from the past 240 hour(s))  SARS Coronavirus 2 by RT PCR (hospital order, performed in [facility] hospital lab) *cepheid  single result test* Anterior Nasal Swab     Status: None   Collection Time: 07/17/22  3:13 PM   Specimen: Anterior Nasal Swab  Result Value Ref Range Status   SARS Coronavirus 2 by RT PCR NEGATIVE NEGATIVE Final    Comment: (NOTE) SARS-CoV-2 target nucleic acids are NOT DETECTED.  The SARS-CoV-2 RNA is generally detectable in upper and lower respiratory specimens during the acute phase of infection. The lowest concentration of SARS-CoV-2 viral copies this assay can detect is 250 copies / mL. A negative result does not preclude SARS-CoV-2 infection and should not be used as the sole basis for treatment or other patient management decisions.  A negative result may occur with improper specimen collection / handling, submission of specimen other than nasopharyngeal swab, presence of viral mutation(s) within the areas targeted by this assay, and inadequate number of viral copies (<250 copies / mL). A negative result must be combined with clinical observations, patient history, and epidemiological information.  Fact Sheet for Patients:   https://www.patel.info/  Fact Sheet for Healthcare Providers: https://hall.com/  This test is not yet approved or  cleared by the [place] FDA and has been authorized for detection and/or diagnosis of SARS-CoV-2 by FDA under an Emergency Use Authorization (EUA).  This EUA will remain in effect (meaning this test can be used) for the duration of the COVID-19 declaration under Section 564(b)(1) of the Act, 21 U.S.C. section  360bbb-3(b)(1), unless the authorization is terminated or revoked sooner.  Performed at [facility], [place] [address], [place], [place] [address]   Culture, blood (Routine X 2) w Reflex to ID Panel     Status: Abnormal   Collection Time: 07/17/22  3:31 PM   Specimen: Left Antecubital; Blood  Result Value Ref Range Status   Specimen Description   Final    LEFT ANTECUBITAL BLOOD Performed at [facility], [place] [address], [place], [place] [address]    Special Requests   Final    BOTTLES DRAWN AEROBIC AND ANAEROBIC Blood Culture adequate volume Performed at [place] [address], [place], [place] [address]    Culture  Setup Time   Final    GRAM POSITIVE COCCI IN CHAINS IN BOTH AEROBIC AND ANAEROBIC BOTTLES CRITICAL RESULT CALLED TO, READ BACK BY AND VERIFIED WITH: PHARMD A Buford Dresser [id number] AT 41 BY CM    Culture (A)  Final    GROUP A STREP (S.PYOGENES) ISOLATED HEALTH DEPARTMENT NOTIFIED Performed at [facility], [place] [address] [place],  [address]    Report Status 07/21/2022 FINAL  Final   Organism ID, Bacteria GROUP A STREP (S.PYOGENES) ISOLATED  Final      Susceptibility   Group a strep (s.pyogenes) isolated - MIC*    PENICILLIN <=0.06 SENSITIVE Sensitive     CEFTRIAXONE <=0.12 SENSITIVE Sensitive     ERYTHROMYCIN <=0.12 SENSITIVE Sensitive     LEVOFLOXACIN 0.5 SENSITIVE Sensitive     VANCOMYCIN 0.25 SENSITIVE Sensitive     * GROUP A STREP (S.PYOGENES) ISOLATED  Blood Culture ID Panel (Reflexed)     Status: Abnormal   Collection Time: 07/17/22  3:31 PM  Result Value Ref Range Status   Enterococcus faecalis NOT DETECTED NOT DETECTED Final   Enterococcus Faecium NOT DETECTED NOT DETECTED Final   Listeria monocytogenes NOT DETECTED NOT DETECTED Final   Staphylococcus species NOT DETECTED NOT DETECTED Final   Staphylococcus aureus (BCID) NOT DETECTED NOT DETECTED Final   Staphylococcus epidermidis  NOT DETECTED  NOT DETECTED Final   Staphylococcus lugdunensis NOT DETECTED NOT DETECTED Final   Streptococcus species DETECTED (A) NOT DETECTED Final    Comment: CRITICAL RESULT CALLED TO, READ BACK BY AND VERIFIED WITH: PHARMD A [place] [id number] AT 815 AM BY CM    Streptococcus agalactiae NOT DETECTED NOT DETECTED Final   Streptococcus pneumoniae NOT DETECTED NOT DETECTED Final   Streptococcus pyogenes DETECTED (A) NOT DETECTED Final    Comment: CRITICAL RESULT CALLED TO, READ BACK BY AND VERIFIED WITH: PHARMD A ELLINGTON [id number] AT 815 AM BY CM    A.calcoaceticus-baumannii NOT DETECTED NOT DETECTED Final   Bacteroides fragilis NOT DETECTED NOT DETECTED Final   Enterobacterales NOT DETECTED NOT DETECTED Final   Enterobacter cloacae complex NOT DETECTED NOT DETECTED Final   Escherichia coli NOT DETECTED NOT DETECTED Final   Klebsiella aerogenes NOT DETECTED NOT DETECTED Final   Klebsiella oxytoca NOT DETECTED NOT DETECTED Final   Klebsiella pneumoniae NOT DETECTED NOT DETECTED Final   Proteus species NOT DETECTED NOT DETECTED Final   Salmonella species NOT DETECTED NOT DETECTED Final   Serratia marcescens NOT DETECTED NOT DETECTED Final   Haemophilus influenzae NOT DETECTED NOT DETECTED Final   Neisseria meningitidis NOT DETECTED NOT DETECTED Final   Pseudomonas aeruginosa NOT DETECTED NOT DETECTED Final   Stenotrophomonas maltophilia NOT DETECTED NOT DETECTED Final   Candida albicans NOT DETECTED NOT DETECTED Final   Candida auris NOT DETECTED NOT DETECTED Final   Candida glabrata NOT DETECTED NOT DETECTED Final   Candida krusei NOT DETECTED NOT DETECTED Final   Candida parapsilosis NOT DETECTED NOT DETECTED Final   Candida tropicalis NOT DETECTED NOT DETECTED Final   Cryptococcus neoformans/gattii NOT DETECTED NOT DETECTED Final    Comment: Performed at [facility], [address], [place], [place] [address]  Culture, blood (Routine X 2) w Reflex to ID Panel     Status: Abnormal    Collection Time: 07/17/22  3:43 PM   Specimen: Right Antecubital; Blood  Result Value Ref Range Status   Specimen Description   Final    RIGHT ANTECUBITAL BLOOD Performed at [facility], [place] [address], [place], [place] [address]    Special Requests   Final    BOTTLES DRAWN AEROBIC AND ANAEROBIC Blood Culture adequate volume Performed at [place] [address], [place], [place] [address]    Culture  Setup Time   Final    GRAM POSITIVE COCCI IN CHAINS IN BOTH AEROBIC AND ANAEROBIC BOTTLES CRITICAL VALUE NOTED.  VALUE IS CONSISTENT WITH PREVIOUSLY REPORTED AND CALLED VALUE.    Culture (A)  Final    GROUP A STREP (S.PYOGENES) ISOLATED SUSCEPTIBILITIES PERFORMED ON PREVIOUS CULTURE WITHIN THE LAST 5 DAYS. Performed at [facility], [place] [address] [place], [place] [address]    Report Status 07/21/2022 FINAL  Final  Body fluid culture w Gram Stain     Status: None   Collection Time: 07/18/22  2:23 PM   Specimen: Synovium; Synovial Fluid  Result Value Ref Range Status   Specimen Description   Final    SYNOVIAL R KNEE Performed at [place] [address], [place], [place] [address]    Special Requests   Final    NONE Performed at [facility], [place] [address] [place], [place] [address]    Gram Stain   Final    MODERATE WBC PRESENT, PREDOMINANTLY PMN RARE GRAM POSITIVE COCCI IN CHAINS  INTRACELLULAR CRITICAL RESULT CALLED TO, READ BACK BY AND VERIFIED WITH: RN REBECCA FEWELL ON 07/18/22 @ 2152 BY DRT    Culture   Final    RARE GROUP A STREP (S.PYOGENES) ISOLATED SUSCEPTIBILITIES PERFORMED ON PREVIOUS CULTURE WITHIN THE LAST 5 DAYS. Performed at [facility], [place] [address], [place], [place] [address]    Report Status 07/22/2022 FINAL  Final  Anaerobic culture w Gram Stain     Status: None (Preliminary result)   Collection Time: 07/18/22  2:23 PM   Specimen: Synovium; Synovial Fluid  Result  Value Ref Range Status   Specimen Description   Final    SYNOVIAL R KNEE Performed at [place] [address] [place], [place] [address]    Special Requests   Final    NONE Performed at [facility], [place] [address], [place], [place] [address]    Gram Stain PENDING  Incomplete   Culture   Final    NO ANAEROBES ISOLATED; CULTURE IN PROGRESS FOR 5 DAYS   Report Status PENDING  Incomplete  Culture, blood (Routine X 2) w Reflex to ID Panel     Status: None (Preliminary result)   Collection Time: 07/19/22  4:49 AM   Specimen: BLOOD LEFT HAND  Result Value Ref Range Status   Specimen Description   Final    BLOOD LEFT HAND Performed at [facility], [place] [address] [place], [place] [address]    Special Requests   Final    BOTTLES DRAWN AEROBIC ONLY Blood Culture adequate volume Performed at [place] [address] [place], [place] [address]    Culture   Final    NO GROWTH 3 DAYS Performed at [facility], [place] [address], [place], [place] [address]    Report Status PENDING  Incomplete  Culture, blood (Routine X 2) w Reflex to ID Panel     Status: None (Preliminary result)   Collection Time: 07/19/22  4:51 AM   Specimen: BLOOD RIGHT HAND  Result Value Ref Range Status   Specimen Description   Final    BLOOD RIGHT HAND Performed at [facility], [place] [address], [place], [place] [address]    Special Requests   Final    BOTTLES DRAWN AEROBIC AND ANAEROBIC Blood Culture adequate volume Performed at [place] [address], [place], [place] [address]    Culture   Final    NO GROWTH 3 DAYS Performed at [facility], [place] [address], [place], [place] [address]    Report Status PENDING  Incomplete  Aerobic/Anaerobic Culture w Gram Stain (surgical/deep wound)     Status: None (Preliminary result)   Collection Time: 07/20/22  9:41 AM   Specimen: Synovial, Right Knee; Body  Fluid  Result Value Ref Range Status   Specimen Description TISSUE  Final   Special Requests RIGHT KNEE 1  Final   Gram Stain NO ORGANISMS SEEN NO WBC SEEN   Final   Culture   Final    NO GROWTH 2 DAYS Performed at [facility], [address], [place], [place] [address]    Report Status PENDING  Incomplete  Aerobic/Anaerobic Culture w Gram Stain (surgical/deep wound)     Status: None (Preliminary result)   Collection Time: 07/20/22  9:42 AM   Specimen: Synovial, Right Knee; Body Fluid  Result Value Ref Range Status   Specimen Description TISSUE  Final  Special Requests RIGHT KNEE 2  Final   Gram Stain NO ORGANISMS SEEN NO WBC SEEN   Final   Culture   Final    NO GROWTH 2 DAYS Performed at [facility], [address], [place], [place] [address]    Report Status PENDING  Incomplete  Aerobic/Anaerobic Culture w Gram Stain (surgical/deep wound)     Status: None (Preliminary result)   Collection Time: 07/20/22  9:42 AM   Specimen: Synovial, Right Knee; Body Fluid  Result Value Ref Range Status   Specimen Description TISSUE  Final   Special Requests RIGHT KNEE 3  Final   Gram Stain NO ORGANISMS SEEN NO WBC SEEN   Final   Culture   Final    NO GROWTH 2 DAYS Performed at [facility], [address], [place], [place] [address]    Report Status PENDING  Incomplete     Medications:    vitamin C  1,000 mg Oral Daily   Chlorhexidine Gluconate Cloth  6 each Topical Q0600   dorzolamide-timolol  1 drop Both Eyes BID   latanoprost  1 drop Both Eyes QHS   losartan  50 mg Oral QHS   multivitamin with minerals  1 tablet Oral Daily   pantoprazole  40 mg Oral Q0600   rivaroxaban  20 mg Oral Daily   sodium chloride flush  3 mL Intravenous Q12H   tamsulosin  0.4 mg Oral Daily   Continuous Infusions:  penicillin G potassium 12 Million Units in dextrose 5 % 500 mL CONTINUOUS infusion 12 Million Units (07/23/22 0701)      LOS: 5 days   Charlynne Cousins  [facility]  07/23/2022, 7:27 AM

## 2022-07-23 NOTE — Progress Notes (Signed)
Occupational Therapy Treatment Patient Details Name: Caleb Barton MRN: [id number] DOB: May 29, 1940 Today's Date: 07/23/2022   History of present illness Caleb Barton is a 82 y.o. male presenting to the ED on 07/17/2022 with sudden onset lower extremity weakness and worsening low back pain.  He did fall to ground where he stayed for 3 hours until spouse could contact son who arrived and called EMS.  Pt admitted 07/17/22 for Sepsis due to group A Streptococcus without acute organ dysfunction.  Pt s/p Right knee 1 component revision poly exchange with irrigation and debridement  by orthopedics on 07/20/22.   Pt with medical history significant of paroxysmal atrial fibrillation/LAFB/RBBB, hypertension, hyperlipidemia, OSA on CPAP, remote history of thalamic CVA, BPH, history of prostate cancer status post radiation treatment approximately a year ago being followed by [facility], Dr. Jeffie Pollock, ablation of the nerves done in his lower back by neurosurgery, Dr.Eichman approximately 2 weeks prior to this adission.   OT comments  Pt premedication for OT session and eager to attempt standing today. Pt able to progress bed mobility to Min A and able to stand briefly using RW at Max A. Unable to progress OOB today d/t pt urgent need for bedpan so returned to bed as no drop arm BSC available. Educated on other transfer options next time including: scooting to drop arm recliner based on ability to scoot easily along bedside and potential trial of bari Stedy in next session based on pt tolerance to R knee flexion. Emphasized positioning and AROM exercises for RLE bed level.Continue to rec postacute rehab with pt/wife agreeable.    Recommendations for follow up therapy are one component of a multi-disciplinary discharge planning process, led by the attending physician.  Recommendations may be updated based on patient status, additional functional criteria and insurance authorization.    Assistance Recommended at  Discharge Frequent or constant Supervision/Assistance  Patient can return home with the following  Two people to help with walking and/or transfers;Assistance with cooking/housework;Assist for transportation;Two people to help with bathing/dressing/bathroom;A lot of help with walking and/or transfers;A lot of help with bathing/dressing/bathroom;Help with stairs or ramp for entrance   Equipment Recommendations  Other (comment) (RW; drop arm BSC; TBD pending progress)    Recommendations for Other Services      Precautions / Restrictions Precautions Precautions: Fall Precaution Comments: R knee wound vac Restrictions Weight Bearing Restrictions: Yes RLE Weight Bearing: Weight bearing as tolerated       Mobility Bed Mobility Overal bed mobility: Needs Assistance Bed Mobility: Supine to Sit, Sit to Supine     Supine to sit: Min assist, HOB elevated Sit to supine: Min assist   General bed mobility comments: Min A for RLE assist in/out of bed. increased time, heavy use of bedrails to lift trunk    Transfers Overall transfer level: Needs assistance Equipment used: Rolling walker (2 wheels) Transfers: Sit to/from Stand Sit to Stand: Max assist, From elevated surface           General transfer comment: slightly elevated bed w/ cues for hand placement. On second trial, pt able to achieve full upright standing for 10 sec with cues to tuck bottom in. unable to advance steps.     Balance Overall balance assessment: Needs assistance Sitting-balance support: Feet supported, Single extremity supported Sitting balance-Leahy Scale: Fair     Standing balance support: Bilateral upper extremity supported, During functional activity, Reliant on assistive device for balance Standing balance-Leahy Scale: Poor  ADL either performed or assessed with clinical judgement   ADL Overall ADL's : Needs assistance/impaired         Upper Body Bathing:  Minimal assistance;Sitting Upper Body Bathing Details (indicate cue type and reason): assist for back                 Toileting- Clothing Manipulation and Hygiene: Total assistance;Bed level Toileting - Clothing Manipulation Details (indicate cue type and reason): assisted on to bedpan as pt unable to safely pivot to reg [facility] that was in room       General ADL Comments: Focus on standing attempts for ADL tasks/transfers. Educated on trial of stedy in next session based on pt tolerance for R knee flexion (only able to locate std Stedy on this floor) and likely ability to scoot transfer to drop arm recliner later today    Extremity/Trunk Assessment Upper Extremity Assessment Upper Extremity Assessment: Overall WFL for tasks assessed   Lower Extremity Assessment Lower Extremity Assessment: Defer to PT evaluation        Vision   Vision Assessment?: No apparent visual deficits   Perception     Praxis      Cognition Arousal/Alertness: Awake/alert Behavior During Therapy: WFL for tasks assessed/performed Overall Cognitive Status: Within Functional Limits for tasks assessed                                          Exercises      Shoulder Instructions       General Comments Wife present and assisting. Noted rash on pt's back - RN aware    Pertinent Vitals/ Pain       Pain Assessment Pain Assessment: Faces Faces Pain Scale: Hurts even more Pain Location: right knee Pain Descriptors / Indicators: Sore, Aching, Tender Pain Intervention(s): Limited activity within patient's tolerance, Monitored during session, Premedicated before session  Home Living                                          Prior Functioning/Environment              Frequency  Min 2X/week        Progress Toward Goals  OT Goals(current goals can now be found in the care plan section)  Progress towards OT goals: Progressing toward goals  Acute Rehab OT  Goals Patient Stated Goal: be able to get on a commode OT Goal Formulation: With patient/family Time For Goal Achievement: 08/01/22 Potential to Achieve Goals: Good ADL Goals Pt Will Perform Grooming: sitting;with set-up Pt Will Perform Lower Body Dressing: with min assist;sit to/from stand;sitting/lateral leans;with adaptive equipment Pt Will Transfer to Toilet: with min assist;stand pivot transfer;bedside commode Pt Will Perform Toileting - Clothing Manipulation and hygiene: with min assist;sitting/lateral leans;sit to/from stand;with adaptive equipment Pt/caregiver will Perform Home Exercise Program: Increased strength;Both right and left upper extremity;Increased ROM;With Supervision  Plan Discharge plan remains appropriate    Co-evaluation                 AM-PAC OT "6 Clicks" Daily Activity     Outcome Measure   Help from another person eating meals?: None Help from another person taking care of personal grooming?: A Little Help from another person toileting, which includes using toliet, bedpan, or urinal?: Total Help  from another person bathing (including washing, rinsing, drying)?: A Lot Help from another person to put on and taking off regular upper body clothing?: A Little Help from another person to put on and taking off regular lower body clothing?: Total 6 Click Score: 14    End of Session Equipment Utilized During Treatment: Gait belt;Rolling walker (2 wheels)  OT Visit Diagnosis: Unsteadiness on feet (R26.81);Pain;Muscle weakness (generalized) (M62.81) Pain - Right/Left: Right Pain - part of body: Knee   Activity Tolerance Patient tolerated treatment well   Patient Left in bed;with call bell/phone within reach;with bed alarm set;with family/visitor present   Nurse Communication Mobility status;Other (comment) (on bedpan)        Time: [id number] OT Time Calculation (min): 36 min  Charges: OT General Charges $OT Visit: 1 Visit OT Treatments $Self  Care/Home Management : 8-22 mins $Therapeutic Activity: 8-22 mins  Malachy Chamber, OTR/L Acute Rehab Services Office: [phone]   Layla Maw 07/23/2022, 10:12 AM

## 2022-07-23 NOTE — Progress Notes (Signed)
     Subjective: Patient doing well this morning.  Aspiration fluid growing group A strep.  Wife at bedside. Today reportedly was a good day. Pain much better in the R knee. Limited by weakness. Working on bed exercises. Was almost able to get up to standing at side of bed.  Objective:   VITALS:   Vitals:   07/22/22 1200 07/22/22 2151 07/23/22 0500 07/23/22 1323  BP:  (!) 143/80  (!) 146/84  Pulse:      Resp: 20   18  Temp: 98.2 F (36.8 C) 98.7 F (37.1 C)  98.5 F (36.9 C)  TempSrc: Oral Oral  Oral  SpO2:    95%  Weight:   114.3 kg   Height:        Sensation intact distally Intact pulses distally Dorsiflexion/Plantar flexion intact Incision: dressing C/D/I Compartment soft   Lab Results  Component Value Date   WBC 6.4 07/23/2022   HGB 12.5 (L) 07/23/2022   HCT 37.7 (L) 07/23/2022   MCV 97.2 07/23/2022   PLT 217 07/23/2022   BMET    Component Value Date/Time   NA 137 07/23/2022 0429   NA 143 07/27/2019 1513   K 3.5 07/23/2022 0429   CL 97 (L) 07/23/2022 0429   CO2 32 07/23/2022 0429   GLUCOSE 118 (H) 07/23/2022 0429   BUN 17 07/23/2022 0429   BUN 11 07/27/2019 1513   CREATININE 0.66 07/23/2022 0429   CALCIUM 8.1 (L) 07/23/2022 0429   GFRNONAA >60 07/23/2022 0429      Xray: X-rays right knee demonstrate no fracture description of the rash is a normalities  Assessment/Plan: 3 Days Post-Op   Principal Problem:   Sepsis due to group A Streptococcus without acute organ dysfunction Active Problems:   HTN (hypertension)   Stroke   Sleep apnea   Right knee DJD   Hypercholesteremia   Malignant neoplasm of prostate   Pneumonia   Lower extremity weakness   Lower back pain   Paroxysmal A-fib   Streptococcal bacteremia   Knee pain, right   Prosthetic joint infection  Status post right knee arthroplasty irrigation debridement and polyethylene liner exchange 07/20/2022    Post op recs: WB: WBAT Abx: Continue scheduled antibiotics per infection  disease for treatment of group A strep.  Follow-up IntraOp cultures. Imaging: PACU xrays DVT prophylaxis: Xarelto 10 mg postop day 1-2, resume full dose Xarelto postop day 3 Follow up: 2 weeks after surgery for a wound check with Dr. Zachery Dakins at [facility].  Address: [address] [place], [place], [place] [address]  Office Phone: [phone]   Willaim Sheng 07/23/2022, 4:40 PM   Charlies Constable, MD  Contact information:   [phone] 7am-5pm epic message Dr. Zachery Dakins, or call office for patient follow up: [phone] After hours and holidays please check [email or web address] for group call information for [facility]

## 2022-07-24 DIAGNOSIS — C61 Malignant neoplasm of prostate: Secondary | ICD-10-CM | POA: Diagnosis not present

## 2022-07-24 DIAGNOSIS — M25561 Pain in right knee: Secondary | ICD-10-CM | POA: Diagnosis not present

## 2022-07-24 DIAGNOSIS — I10 Essential (primary) hypertension: Secondary | ICD-10-CM | POA: Diagnosis not present

## 2022-07-24 DIAGNOSIS — A400 Sepsis due to streptococcus, group A: Secondary | ICD-10-CM | POA: Diagnosis not present

## 2022-07-24 LAB — CULTURE, BLOOD (ROUTINE X 2)
Culture: NO GROWTH
Culture: NO GROWTH
Special Requests: ADEQUATE
Special Requests: ADEQUATE

## 2022-07-24 LAB — GLUCOSE, CAPILLARY: Glucose-Capillary: 160 mg/dL — ABNORMAL HIGH (ref 70–99)

## 2022-07-24 MED ORDER — AMLODIPINE BESYLATE 10 MG PO TABS
5.0000 mg | ORAL_TABLET | Freq: Every day | ORAL | Status: DC
  Administered 2022-07-24 – 2022-07-28 (×5): 5 mg via ORAL
  Filled 2022-07-24 (×5): qty 1

## 2022-07-24 MED ORDER — PENICILLIN G POTASSIUM 20000000 UNITS IJ SOLR
12.0000 10*6.[IU] | Freq: Two times a day (BID) | INTRAVENOUS | Status: DC
  Administered 2022-07-24 – 2022-07-28 (×7): 12 10*6.[IU] via INTRAVENOUS
  Filled 2022-07-24 (×9): qty 12

## 2022-07-24 MED ORDER — PENICILLIN G POTASSIUM 20000000 UNITS IJ SOLR: 12.0000 10*6.[IU] | Freq: Two times a day (BID) | INTRAVENOUS | Status: DC

## 2022-07-24 MED ORDER — LOSARTAN POTASSIUM 50 MG PO TABS
100.0000 mg | ORAL_TABLET | Freq: Every day | ORAL | Status: DC
  Administered 2022-07-24 – 2022-07-27 (×4): 100 mg via ORAL
  Filled 2022-07-24 (×4): qty 2

## 2022-07-24 NOTE — TOC Progression Note (Addendum)
Transition of Care [facility]) - Progression Note    Patient Details  Name: Caleb Barton MRN: [id number] Date of Birth: [birth year]  [facility]) CM/SW Contact  Henrietta Dine, RN Phone Number: 07/24/2022, 11:02 AM  Clinical Narrative:    Pt and wife given list of bed offers'; contacted pt's wife Caleb Barton [phone]) regarding choice; she says she is enroute to visit facility; wife advised to contact TOC when she arrives on unit; awaiting choice; ins auth obtained by facility.  -1400- spoke w/ pt and family in room; they have selected [facility]; pt says her prefers a private room; called facility and spoke w/ Thurmond Butts; he says admissions at facility is now centralized; spoke w/ [place] at [facility] notified and she will start [place]; she says auth normally takes 24-28 hrs and she will contact this CM when Josem Kaufmann is received. Expected Discharge Plan: [place] Barriers to Discharge: Continued Medical Work up  Expected Discharge Plan and Services       Living arrangements for the past 2 months: Single Family Home                                       Social Determinants of Health (SDOH) Interventions SDOH Screenings   Food Insecurity: No Food Insecurity (07/18/2022)  Housing: Low Risk  (07/18/2022)  Transportation Needs: No Transportation Needs (07/18/2022)  Utilities: Not At Risk (07/18/2022)  Tobacco Use: Medium Risk (07/21/2022)    Readmission Risk Interventions     No data to display

## 2022-07-24 NOTE — Care Management Important Message (Signed)
Important Message  Patient Details IM Letter given. Name: Caleb Barton MRN: [id number] Date of Birth: Sep 28, 1940   Medicare Important Message Given:  Yes     Kerin Salen 07/24/2022, 3:42 PM

## 2022-07-24 NOTE — Progress Notes (Signed)
[facility] PROGRESS NOTE    Progress Note  Caleb Barton  [id number] DOB: 1940-07-17 DOA: 07/17/2022 PCP: Glenis Smoker, MD     Brief Narrative:   Caleb Barton is an 82 y.o. male past medical history of paroxysmal atrial fibrillation on anticoagulation, history of CVA, prostate cancer status post radiation treatment comes in with sudden onset of lower extremity weakness and worsening back pain.  He status post ablation 2 weeks ago by neurosurgery for back pain, he reports sudden onset of lower extremity weakness approximately 3 hours prior to admission.  MRI of the lumbar spine reviewed by neurosurgery who feels he does not need any surgical intervention at this time and feels patient's weakness is not due to the lumbar spine, he was noted to spike a fever in the ED chest x-ray concerning for bilateral infiltrates, blood cultures grew strep pyogenes, he was placed empirically on antibiotics, knee x-ray showed an effusion ID and orthopedic surgery was consulted as there was a concern of seeding of the prosthetic joint underwent aspiration which was positive for Streptococcus group A.  Surveillance blood cultures have been negative till date.  Assessment/Plan:   Sepsis due to group A Streptococcus without acute organ dysfunction Blood cultures were positive for strep pyogenes of unclear source. 2D echo showed no vegetation. Surveillance blood cultures remain negative till date. ID recommended IV penicillin previously on linezolid which was discontinued on 07/19/2022. ID recommended 6 weeks of IV antibiotics with start date on 07/21/2022, once antibiotics are completed IV ID will follow-up and will decide on suppressive therapy. PICC line was placed on 07/22/2022. Scheduled for ID follow-up on 08/12/2022.  Right prosthetic knee infection: Orthopedic surgery was consulted underwent aspiration cultures grew Streptococcus group A. Orthopedic surgery recommended a washout with liner  exchange done on 07/20/2022. Cultures and sensitivities are back, ID recommended IV penicillins for 6 weeks. Right knee is wrapped currently with wound VAC. Follow-up with orthopedic and ID as an outpatient. PT evaluated the patient, will need skilled nursing facility placement. Orthopedic surgery recommended Xarelto 10 mg postop. They also recommended to follow-up with them in 2 weeks as an outpatient.  Questionable pneumonia: Has completed course of antibiotics in house.  Mild rhabdomyolysis: Probably secondary to fall resolved with IV fluids.  Transaminitis: Likely due to rhabdomyolysis acute hepatitis panel has been nonreactive. LFTs are slowly trending down.  Paroxysmal atrial fibrillation: Rate controlled continue Xarelto.  Bilateral lower extremity weakness/low back pain present on admission: Probably secondary to sepsis. MRI of the L-spine was done that showed no acute findings EDP discussed with neurosurgery did not feel any surgical intervention was needed and also recommended PT OT. The case was discussed with Dr. Joaquim Lai who graciously agreed to see the patient and reviewed the film recommended no further intervention. Statins were held on admission. PT OT evaluated the patient awaiting skilled nursing facility placement.  BPH: Continue Flomax.  Hyperlipidemia: Holding statin due to rhabdomyolysis and lower extremity weakness can be resumed as an outpatient by PCP.  Dehydration: Appears euvolemic continue to hold hydrochlorothiazide.  Essential hypertension: Blood pressure has been slowly trending up.  He received several days of IV Lasix now is soft.. Blood pressure continues to be significantly elevated increase losartan and Norvasc.   DVT prophylaxis: lovenox Family Communication:none Status is: Inpatient Remains inpatient appropriate because: Strep infection will need skilled nursing facility placement.    Code Status:     Code Status Orders   (From admission, onward)  Start     Ordered   07/17/22 1805  Do not attempt resuscitation (DNR)  Continuous       Question Answer Comment  If patient has no pulse and is not breathing Do Not Attempt Resuscitation   If patient has a pulse and/or is breathing: Medical Treatment Goals LIMITED ADDITIONAL INTERVENTIONS: Use medication/IV fluids and cardiac monitoring as indicated; Do not use intubation or mechanical ventilation (DNI), also provide comfort medications.  Transfer to Progressive/Stepdown as indicated, avoid Intensive Care.   Consent: Discussion documented in EHR or advanced directives reviewed      07/17/22 1814           Code Status History     Date Active Date Inactive Code Status Order ID Comments User Context   07/01/2021 1417 07/02/2021 1646 Full Code [id number]  Willaim Sheng, MD Inpatient   07/29/2019 1211 07/29/2019 2032 Full Code [id number]  Wellington Hampshire, MD Inpatient   02/09/2012 1136 02/10/2012 1714 Full Code [id number]  Glenford Bayley., RN Inpatient      Advance Directive Documentation    Flowsheet Row Most Recent Value  Type of Advance Directive Living will  Pre-existing out of facility DNR order (yellow form or pink MOST form) --  "MOST" Form in Place? --         IV Access:   Peripheral IV   Procedures and diagnostic studies:   DG CHEST PORT 1 VIEW  Result Date: 07/22/2022 CLINICAL DATA:  PICC line placement EXAM: PORTABLE CHEST 1 VIEW COMPARISON:  Portable exam 1207 hours compared to 07/17/2022 FINDINGS: RIGHT arm PICC line tip projects over SVC. Upper normal size of cardiac silhouette. Mediastinal contours and pulmonary vascularity normal. Atherosclerotic calcification aorta. Minimal bibasilar atelectasis. Lungs otherwise clear. No pleural effusion or pneumothorax. IMPRESSION: Tip of RIGHT arm PICC line projects over SVC. Bibasilar atelectasis. 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 Electronically Signed   By: Lavonia Dana M.D.   On: 07/22/2022 12:38     Medical Consultants:   None.   Subjective:    Caleb Barton has had bowel movement he relates his abdominal pain is improved today.  Objective:    Vitals:   07/23/22 1323 07/23/22 2217 07/24/22 0500 07/24/22 0639  BP: (!) 146/84 (!) 155/80  133/88  Pulse:    76  Resp: 18     Temp: 98.5 F (36.9 C) 98.7 F (37.1 C)  99.3 F (37.4 C)  TempSrc: Oral   Oral  SpO2: 95% 94%    Weight:   110.2 kg   Height:       SpO2: 94 % O2 Flow Rate (L/min): 3 L/min   Intake/Output Summary (Last 24 hours) at 07/24/2022 0903 Last data filed at 07/24/2022 0626 Gross per 24 hour  Intake 1200 ml  Output 4100 ml  Net -2900 ml    Filed Weights   07/22/22 0525 07/23/22 0500 07/24/22 0500  Weight: 114.9 kg 114.3 kg 110.2 kg    Exam: General exam: In no acute distress. Respiratory system: Good air movement and clear to auscultation. Cardiovascular system: S1 & S2 heard, RRR. No JVD. Gastrointestinal system: Abdomen is nondistended, soft and nontender.  Extremities: Right knee wrapped with wound VAC in place Skin: No rashes, lesions or ulcers Psychiatry: Judgement and insight appear normal. Mood & affect appropriate. Data Reviewed:    Labs: Basic Metabolic Panel: Recent Labs  Lab 07/18/22 0340 07/18/22 0341 07/19/22 0451 07/20/22 0409 07/21/22 0415 07/22/22 0435 07/23/22 0429  NA  --    < >  131* 129* 135 137 137  K  --    < > 3.7 3.7 3.9 3.6 3.5  CL  --    < > 99 98 98 97* 97*  CO2  --    < > 24 24 30  33* 32  GLUCOSE  --    < > 127* 129* 158* 133* 118*  BUN  --    < > 21 21 23 23 17   CREATININE  --    < > 0.76 0.69 0.79 0.80 0.66  CALCIUM  --    < > 8.1* 8.3* 8.4* 8.3* 8.1*  MG 2.1  --  2.2 2.3 2.3 2.5*  --    < > = values in this interval not displayed.    GFR Estimated Creatinine Clearance: 91.6 mL/min (by C-G formula based on SCr of 0.66 mg/dL). Liver Function Tests: Recent Labs  Lab 07/19/22 0451 07/20/22 0409  07/21/22 0415 07/22/22 0435 07/23/22 0429  AST 111* 89* 56* 73* 61*  ALT 87* 83* 72* 97* 101*  ALKPHOS 66 92 93 107 125  BILITOT 3.4* 2.8* 1.7* 1.3* 1.6*  PROT 5.8* 5.8* 5.6* 5.6* 5.9*  ALBUMIN 3.3* 3.0* 2.5* 2.6* 2.5*    No results for input(s): "LIPASE", "AMYLASE" in the last 168 hours. No results for input(s): "AMMONIA" in the last 168 hours. Coagulation profile Recent Labs  Lab 07/18/22 0341 07/18/22 1759 07/19/22 0726 07/20/22 0409  INR 3.4* 2.2* 1.8* 1.2    COVID-19 Labs  No results for input(s): "DDIMER", "FERRITIN", "LDH", "CRP" in the last 72 hours.  Lab Results  Component Value Date   SARSCOV2NAA NEGATIVE 07/17/2022   [place] NEGATIVE 06/27/2021   [place] NEGATIVE 07/27/2019    CBC: Recent Labs  Lab 07/19/22 0451 07/20/22 0409 07/21/22 0415 07/22/22 0435 07/23/22 0429  WBC 11.8* 9.5 11.0* 7.8 6.4  NEUTROABS 10.3* 8.1* 9.4* 6.0 4.3  HGB 12.6* 12.3* 11.8* 12.0* 12.5*  HCT 38.2* 36.2* 35.5* 36.3* 37.7*  MCV 97.4 96.3 98.6 98.4 97.2  PLT 101* 108* 127* 160 217    Cardiac Enzymes: Recent Labs  Lab 07/17/22 1950 07/18/22 0341 07/19/22 0451 07/20/22 0409  CKTOTAL 3,833* 3,438* 1,931* 979*    BNP (last 3 results) No results for input(s): "PROBNP" in the last 8760 hours. CBG: Recent Labs  Lab 07/19/22 0751 07/20/22 0428 07/20/22 0735 07/21/22 0807  GLUCAP 123* 119* 121* 130*    D-Dimer: No results for input(s): "DDIMER" in the last 72 hours. Hgb A1c: No results for input(s): "HGBA1C" in the last 72 hours. Lipid Profile: No results for input(s): "CHOL", "HDL", "LDLCALC", "TRIG", "CHOLHDL", "LDLDIRECT" in the last 72 hours. Thyroid function studies: No results for input(s): "TSH", "T4TOTAL", "T3FREE", "THYROIDAB" in the last 72 hours.  Invalid input(s): "FREET3" Anemia work up: No results for input(s): "VITAMINB12", "FOLATE", "FERRITIN", "TIBC", "IRON", "RETICCTPCT" in the last 72 hours. Sepsis Labs: Recent Labs  Lab  07/17/22 1543 07/17/22 1730 07/18/22 0341 07/20/22 0409 07/21/22 0415 07/22/22 0435 07/23/22 0429  WBC  --   --    < > 9.5 11.0* 7.8 6.4  LATICACIDVEN 2.3* 1.8  --   --   --   --   --    < > = values in this interval not displayed.    Microbiology Recent Results (from the past 240 hour(s))  SARS Coronavirus 2 by RT PCR (hospital order, performed in [facility] hospital lab) *cepheid single result test* Anterior Nasal Swab     Status: None   Collection Time:  07/17/22  3:13 PM   Specimen: Anterior Nasal Swab  Result Value Ref Range Status   SARS Coronavirus 2 by RT PCR NEGATIVE NEGATIVE Final    Comment: (NOTE) SARS-CoV-2 target nucleic acids are NOT DETECTED.  The SARS-CoV-2 RNA is generally detectable in upper and lower respiratory specimens during the acute phase of infection. The lowest concentration of SARS-CoV-2 viral copies this assay can detect is 250 copies / mL. A negative result does not preclude SARS-CoV-2 infection and should not be used as the sole basis for treatment or other patient management decisions.  A negative result may occur with improper specimen collection / handling, submission of specimen other than nasopharyngeal swab, presence of viral mutation(s) within the areas targeted by this assay, and inadequate number of viral copies (<250 copies / mL). A negative result must be combined with clinical observations, patient history, and epidemiological information.  Fact Sheet for Patients:   https://www.patel.info/  Fact Sheet for Healthcare Providers: https://hall.com/  This test is not yet approved or  cleared by the [place] FDA and has been authorized for detection and/or diagnosis of SARS-CoV-2 by FDA under an Emergency Use Authorization (EUA).  This EUA will remain in effect (meaning this test can be used) for the duration of the COVID-19 declaration under Section 564(b)(1) of the Act, 21  U.S.C. section 360bbb-3(b)(1), unless the authorization is terminated or revoked sooner.  Performed at [facility], [place] [address] [place], [place] [address]   Culture, blood (Routine X 2) w Reflex to ID Panel     Status: Abnormal   Collection Time: 07/17/22  3:31 PM   Specimen: Left Antecubital; Blood  Result Value Ref Range Status   Specimen Description   Final    LEFT ANTECUBITAL BLOOD Performed at [facility], [place] [address], [place], [place] [address]    Special Requests   Final    BOTTLES DRAWN AEROBIC AND ANAEROBIC Blood Culture adequate volume Performed at [place] [address], [place], [place] [address]    Culture  Setup Time   Final    GRAM POSITIVE COCCI IN CHAINS IN BOTH AEROBIC AND ANAEROBIC BOTTLES CRITICAL RESULT CALLED TO, READ BACK BY AND VERIFIED WITH: PHARMD A Buford Dresser [id number] AT 78 BY CM    Culture (A)  Final    GROUP A STREP (S.PYOGENES) ISOLATED HEALTH DEPARTMENT NOTIFIED Performed at [facility], [place] [address], [place], [place] [address]    Report Status 07/21/2022 FINAL  Final   Organism ID, Bacteria GROUP A STREP (S.PYOGENES) ISOLATED  Final      Susceptibility   Group a strep (s.pyogenes) isolated - MIC*    PENICILLIN <=0.06 SENSITIVE Sensitive     CEFTRIAXONE <=0.12 SENSITIVE Sensitive     ERYTHROMYCIN <=0.12 SENSITIVE Sensitive     LEVOFLOXACIN 0.5 SENSITIVE Sensitive     VANCOMYCIN 0.25 SENSITIVE Sensitive     * GROUP A STREP (S.PYOGENES) ISOLATED  Blood Culture ID Panel (Reflexed)     Status: Abnormal   Collection Time: 07/17/22  3:31 PM  Result Value Ref Range Status   Enterococcus faecalis NOT DETECTED NOT DETECTED Final   Enterococcus Faecium NOT DETECTED NOT DETECTED Final   Listeria monocytogenes NOT DETECTED NOT DETECTED Final   Staphylococcus species NOT DETECTED NOT DETECTED Final   Staphylococcus aureus (BCID) NOT DETECTED NOT DETECTED Final   Staphylococcus  epidermidis NOT DETECTED NOT DETECTED Final   Staphylococcus lugdunensis NOT DETECTED NOT DETECTED Final  Streptococcus species DETECTED (A) NOT DETECTED Final    Comment: CRITICAL RESULT CALLED TO, READ BACK BY AND VERIFIED WITH: PHARMD A ELLINGTON [id number] AT 815 AM BY CM    Streptococcus agalactiae NOT DETECTED NOT DETECTED Final   Streptococcus pneumoniae NOT DETECTED NOT DETECTED Final   Streptococcus pyogenes DETECTED (A) NOT DETECTED Final    Comment: CRITICAL RESULT CALLED TO, READ BACK BY AND VERIFIED WITH: PHARMD A ELLINGTON [id number] AT 815 AM BY CM    A.calcoaceticus-baumannii NOT DETECTED NOT DETECTED Final   Bacteroides fragilis NOT DETECTED NOT DETECTED Final   Enterobacterales NOT DETECTED NOT DETECTED Final   Enterobacter cloacae complex NOT DETECTED NOT DETECTED Final   Escherichia coli NOT DETECTED NOT DETECTED Final   Klebsiella aerogenes NOT DETECTED NOT DETECTED Final   Klebsiella oxytoca NOT DETECTED NOT DETECTED Final   Klebsiella pneumoniae NOT DETECTED NOT DETECTED Final   Proteus species NOT DETECTED NOT DETECTED Final   Salmonella species NOT DETECTED NOT DETECTED Final   Serratia marcescens NOT DETECTED NOT DETECTED Final   Haemophilus influenzae NOT DETECTED NOT DETECTED Final   Neisseria meningitidis NOT DETECTED NOT DETECTED Final   Pseudomonas aeruginosa NOT DETECTED NOT DETECTED Final   Stenotrophomonas maltophilia NOT DETECTED NOT DETECTED Final   Candida albicans NOT DETECTED NOT DETECTED Final   Candida auris NOT DETECTED NOT DETECTED Final   Candida glabrata NOT DETECTED NOT DETECTED Final   Candida krusei NOT DETECTED NOT DETECTED Final   Candida parapsilosis NOT DETECTED NOT DETECTED Final   Candida tropicalis NOT DETECTED NOT DETECTED Final   Cryptococcus neoformans/gattii NOT DETECTED NOT DETECTED Final    Comment: Performed at [facility], [address] [place], [place] [address]  Culture, blood (Routine X 2) w Reflex to ID Panel      Status: Abnormal   Collection Time: 07/17/22  3:43 PM   Specimen: Right Antecubital; Blood  Result Value Ref Range Status   Specimen Description   Final    RIGHT ANTECUBITAL BLOOD Performed at [facility], [place] [address] [place], [place] [address]    Special Requests   Final    BOTTLES DRAWN AEROBIC AND ANAEROBIC Blood Culture adequate volume Performed at [place] [address], [place], [place] [address]    Culture  Setup Time   Final    GRAM POSITIVE COCCI IN CHAINS IN BOTH AEROBIC AND ANAEROBIC BOTTLES CRITICAL VALUE NOTED.  VALUE IS CONSISTENT WITH PREVIOUSLY REPORTED AND CALLED VALUE.    Culture (A)  Final    GROUP A STREP (S.PYOGENES) ISOLATED SUSCEPTIBILITIES PERFORMED ON PREVIOUS CULTURE WITHIN THE LAST 5 DAYS. Performed at [facility], [place] [address] [place], [place] [address]    Report Status 07/21/2022 FINAL  Final  Body fluid culture w Gram Stain     Status: None   Collection Time: 07/18/22  2:23 PM   Specimen: Synovium; Synovial Fluid  Result Value Ref Range Status   Specimen Description   Final    SYNOVIAL R KNEE Performed at [place] [address] [place], [place] [address]    Special Requests   Final    NONE Performed at [facility], [place] [address] [place], [place] [address]    Gram Stain   Final    MODERATE WBC PRESENT, PREDOMINANTLY PMN RARE GRAM POSITIVE COCCI IN CHAINS INTRACELLULAR CRITICAL RESULT CALLED TO, READ BACK BY AND VERIFIED WITH: RN REBECCA FEWELL ON 07/18/22 @  2152 BY DRT    Culture   Final    RARE GROUP A STREP (S.PYOGENES) ISOLATED SUSCEPTIBILITIES PERFORMED ON PREVIOUS CULTURE WITHIN THE LAST 5 DAYS. Performed at [facility], [place] [address] [place], [place] [address]    Report Status 07/22/2022 FINAL  Final  Anaerobic culture w Gram Stain     Status: None   Collection Time: 07/18/22  2:23 PM   Specimen: Synovium; Synovial Fluid  Result  Value Ref Range Status   Specimen Description   Final    SYNOVIAL R KNEE Performed at [place] [address] [place], [place] [address]    Special Requests   Final    NONE Performed at [facility], [place] [address], [place], [place] [address]    Gram Stain   Final    NO ANAEROBES ISOLATED Performed at [facility], [place] [address] [place], [place] [address]    Culture   Final    NO ANAEROBES ISOLATED; CULTURE IN PROGRESS FOR 5 DAYS   Report Status 07/23/2022 FINAL  Final  Culture, blood (Routine X 2) w Reflex to ID Panel     Status: None   Collection Time: 07/19/22  4:49 AM   Specimen: BLOOD LEFT HAND  Result Value Ref Range Status   Specimen Description   Final    BLOOD LEFT HAND Performed at [facility], [place] [address], [place], [place] [address]    Special Requests   Final    BOTTLES DRAWN AEROBIC ONLY Blood Culture adequate volume Performed at [place] [address], [place], [place] [address]    Culture   Final    NO GROWTH 5 DAYS Performed at [facility], [place] [address] [place], [place] [address]    Report Status 07/24/2022 FINAL  Final  Culture, blood (Routine X 2) w Reflex to ID Panel     Status: None   Collection Time: 07/19/22  4:51 AM   Specimen: BLOOD RIGHT HAND  Result Value Ref Range Status   Specimen Description   Final    BLOOD RIGHT HAND Performed at [facility], [place] [address] [place], [place] [address]    Special Requests   Final    BOTTLES DRAWN AEROBIC AND ANAEROBIC Blood Culture adequate volume Performed at [place] [address], [place], [place] [address]    Culture   Final    NO GROWTH 5 DAYS Performed at [facility], [place] [address], [place], [place] [address]    Report Status 07/24/2022 FINAL  Final  Aerobic/Anaerobic Culture w Gram Stain (surgical/deep wound)     Status: None (Preliminary result)   Collection  Time: 07/20/22  9:41 AM   Specimen: Synovial, Right Knee; Body Fluid  Result Value Ref Range Status   Specimen Description TISSUE  Final   Special Requests RIGHT KNEE 1  Final   Gram Stain NO ORGANISMS SEEN NO WBC SEEN   Final   Culture   Final    NO GROWTH 3 DAYS NO ANAEROBES ISOLATED; CULTURE IN PROGRESS FOR 5 DAYS Performed at [facility], [address] [place], [place] [address]    Report Status PENDING  Incomplete  Aerobic/Anaerobic Culture w Gram Stain (surgical/deep wound)     Status: None (Preliminary result)   Collection Time: 07/20/22  9:42 AM   Specimen: Synovial, Right Knee; Body Fluid  Result Value Ref Range  Status   Specimen Description TISSUE  Final   Special Requests RIGHT KNEE 2  Final   Gram Stain NO ORGANISMS SEEN NO WBC SEEN   Final   Culture   Final    NO GROWTH 3 DAYS NO ANAEROBES ISOLATED; CULTURE IN PROGRESS FOR 5 DAYS Performed at [facility], [place] [address], [place], [place] [address]    Report Status PENDING  Incomplete  Aerobic/Anaerobic Culture w Gram Stain (surgical/deep wound)     Status: None (Preliminary result)   Collection Time: 07/20/22  9:42 AM   Specimen: Synovial, Right Knee; Body Fluid  Result Value Ref Range Status   Specimen Description TISSUE  Final   Special Requests RIGHT KNEE 3  Final   Gram Stain NO ORGANISMS SEEN NO WBC SEEN   Final   Culture   Final    NO GROWTH 3 DAYS NO ANAEROBES ISOLATED; CULTURE IN PROGRESS FOR 5 DAYS Performed at [facility], [address] [place], [place] [address]    Report Status PENDING  Incomplete     Medications:    amLODipine  2.5 mg Oral Daily   vitamin C  1,000 mg Oral Daily   Chlorhexidine Gluconate Cloth  6 each Topical Q0600   dorzolamide-timolol  1 drop Both Eyes BID   latanoprost  1 drop Both Eyes QHS   losartan  50 mg Oral QHS   multivitamin with minerals  1 tablet Oral Daily   pantoprazole  40 mg Oral Q0600   polyethylene glycol  17 g Oral BID    rivaroxaban  20 mg Oral Daily   sodium chloride flush  3 mL Intravenous Q12H   tamsulosin  0.4 mg Oral Daily   Continuous Infusions:  penicillin G potassium 12 Million Units in dextrose 5 % 500 mL CONTINUOUS infusion 12 Million Units (07/24/22 0703)      LOS: 6 days   Charlynne Cousins  [facility]  07/24/2022, 9:03 AM

## 2022-07-24 NOTE — Progress Notes (Signed)
Physical Therapy Treatment Patient Details Name: Caleb Barton MRN: [id number] DOB: 1940-06-17 Today's Date: 07/24/2022   History of Present Illness      PT Comments    POD #4 Max Assist +3 getting pt out of recliner. Limited by fatigue and weakness. Assisted +3 ambulating in room. Recliner was close behind by family present. Ambulated 4 ft and then max assist +3 back into recliner. Educated on exercises, given strap to self perform.     Recommendations for follow up therapy are one component of a multi-disciplinary discharge planning process, led by the attending physician.  Recommendations may be updated based on patient status, additional functional criteria and insurance authorization.  Follow Up Recommendations  Can patient physically be transported by private vehicle: No    Assistance Recommended at Discharge Frequent or constant Supervision/Assistance  Patient can return home with the following Two people to help with walking and/or transfers;A lot of help with bathing/dressing/bathroom;Assist for transportation;Help with stairs or ramp for entrance;Assistance with cooking/housework   Equipment Recommendations  None recommended by PT    Recommendations for Other Services       Precautions / Restrictions Precautions Precautions: Fall Precaution Comments: R knee wound vac Restrictions Weight Bearing Restrictions: Yes RLE Weight Bearing: Weight bearing as tolerated LLE Weight Bearing: Weight bearing as tolerated     Mobility  Bed Mobility                    Transfers Overall transfer level: Needs assistance Equipment used: Rolling walker (2 wheels) Transfers: Sit to/from Stand Sit to Stand: Max assist (+3 assist)                Ambulation/Gait Ambulation/Gait assistance: Max assist (+3 assist with recliner close behind. Be aware of bilateral weakness. Very weak, short steps.) Gait Distance (Feet): 4 Feet Assistive device: Rolling walker (2 wheels)  (+3 Assist) Gait Pattern/deviations: Decreased stance time - right, Decreased step length - right, Decreased weight shift to right, Knees buckling, Antalgic, Step-to pattern (hips flexed) Gait velocity: descreased     General Gait Details: Very weak, small steps. Hips and knees buckling.   Stairs             Wheelchair Mobility    Modified Rankin (Stroke Patients Only)       Balance                                            Cognition Arousal/Alertness: Awake/alert Behavior During Therapy: WFL for tasks assessed/performed Overall Cognitive Status: Within Functional Limits for tasks assessed                                          Exercises Total Joint Exercises Long Arc Quad: AAROM, Right, 5 reps, Seated Marching in Standing: AAROM, Right, 5 reps, Seated    General Comments        Pertinent Vitals/Pain Pain Assessment Pain Assessment: 0-10 Pain Score: 5  Pain Location: right knee Pain Descriptors / Indicators: Operative site guarding, Discomfort Pain Intervention(s): Limited activity within patient's tolerance, Repositioned, Ice applied    Home Living                          Prior Function  PT Goals (current goals can now be found in the care plan section) Acute Rehab PT Goals PT Goal Formulation: With patient/family Time For Goal Achievement: 08/04/22 Potential to Achieve Goals: Good Progress towards PT goals: Progressing toward goals    Frequency           PT Plan Current plan remains appropriate    Co-evaluation              AM-PAC PT "6 Clicks" Mobility   Outcome Measure  Help needed turning from your back to your side while in a flat bed without using bedrails?: Total Help needed moving from lying on your back to sitting on the side of a flat bed without using bedrails?: Total Help needed moving to and from a bed to a chair (including a wheelchair)?: Total Help needed  standing up from a chair using your arms (e.g., wheelchair or bedside chair)?: Total Help needed to walk in hospital room?: Total Help needed climbing 3-5 steps with a railing? : Total 6 Click Score: 6    End of Session Equipment Utilized During Treatment: Gait belt Activity Tolerance: Patient limited by pain;Patient limited by fatigue Patient left: in chair;with family/visitor present Nurse Communication: Mobility status PT Visit Diagnosis: Difficulty in walking, not elsewhere classified (R26.2);Muscle weakness (generalized) (M62.81)     Time: [id number] PT Time Calculation (min) (ACUTE ONLY): 18 min  Charges:  $Therapeutic Activity: 8-22 mins                     Clayborne Artist, SPTA

## 2022-07-24 NOTE — Plan of Care (Signed)
  Problem: Clinical Measurements: Goal: Ability to maintain a body temperature in the normal range will improve Outcome: Completed/Met   Problem: Education: Goal: Knowledge of General Education information will improve Description: Including pain rating scale, medication(s)/side effects and non-pharmacologic comfort measures Outcome: Completed/Met   Problem: Nutrition: Goal: Adequate nutrition will be maintained Outcome: Progressing

## 2022-07-25 DIAGNOSIS — R7881 Bacteremia: Secondary | ICD-10-CM | POA: Diagnosis not present

## 2022-07-25 DIAGNOSIS — B955 Unspecified streptococcus as the cause of diseases classified elsewhere: Secondary | ICD-10-CM | POA: Diagnosis not present

## 2022-07-25 LAB — GLUCOSE, CAPILLARY: Glucose-Capillary: 116 mg/dL — ABNORMAL HIGH (ref 70–99)

## 2022-07-25 NOTE — Progress Notes (Signed)
[facility] PROGRESS NOTE    Progress Note  ELIAZAR Barton  IRC:[id number] DOB: 1941/01/01 DOA: 07/17/2022 PCP: Shon Hale, MD     Brief Narrative:   Caleb Barton is an 82 y.o. male past medical history of paroxysmal atrial fibrillation on anticoagulation, history of CVA, prostate cancer status post radiation treatment comes in with sudden onset of lower extremity weakness and worsening back pain.  He status post ablation 2 weeks ago by neurosurgery for back pain, he reports sudden onset of lower extremity weakness approximately 3 hours prior to admission.  MRI of the lumbar spine reviewed by neurosurgery who feels he does not need any surgical intervention at this time and feels patient's weakness is not due to the lumbar spine, he was noted to spike a fever in the ED chest x-ray concerning for bilateral infiltrates, blood cultures grew strep pyogenes, he was placed empirically on antibiotics, knee x-ray showed an effusion ID and orthopedic surgery was consulted as there was a concern of seeding of the prosthetic joint underwent aspiration which was positive for Streptococcus group A.  Surveillance blood cultures have been negative till date.  Assessment/Plan:   Sepsis due to group A Streptococcus without acute organ dysfunction Blood cultures were positive for strep pyogenes of unclear source. 2D echo showed no vegetation. Surveillance blood cultures remain negative till date. ID recommended IV penicillin. ID recommended 6 weeks of IV antibiotics with start date on 07/21/2022, once antibiotics are completed IV ID will follow-up and will decide on suppressive therapy. PICC line was placed on 07/22/2022. Scheduled for ID follow-up on 08/12/2022. PT evaluated the patient currently awaiting authorization approval.  Right prosthetic knee infection: Orthopedic surgery was consulted underwent aspiration cultures grew Streptococcus group A. Orthopedic surgery recommended a washout  with liner exchange done on 07/20/2022. Cultures and sensitivities are back, ID recommended IV penicillins for 6 weeks. Right knee is wrapped currently with wound VAC. Follow-up with orthopedic and ID as an outpatient. PT evaluated the patient, will need skilled nursing facility placement. Orthopedic surgery recommended Xarelto 10 mg postop. They also recommended to follow-up with them in 2 weeks as an outpatient.  Questionable pneumonia: Has completed course of antibiotics in house.  Mild rhabdomyolysis: Probably secondary to fall resolved with IV fluids.  Transaminitis: Likely due to rhabdomyolysis acute hepatitis panel has been nonreactive. LFTs are slowly trending down.  Paroxysmal atrial fibrillation: Rate controlled continue Xarelto.  Bilateral lower extremity weakness/low back pain present on admission: Probably secondary to sepsis. MRI of the L-spine was done that showed no acute findings EDP discussed with neurosurgery did not feel any surgical intervention was needed and also recommended PT OT. The case was discussed with Dr. Weber Cooks who graciously agreed to see the patient and reviewed the film recommended no further intervention. Statins were held on admission. PT OT evaluated the patient awaiting skilled nursing facility placement.  BPH: Continue Flomax.  Hyperlipidemia: Holding statin due to rhabdomyolysis and lower extremity weakness can be resumed as an outpatient by PCP.  Dehydration: Appears euvolemic continue to hold hydrochlorothiazide.  Essential hypertension: Blood pressure has been slowly trending up.  He received several days of IV Lasix now is soft.. Blood pressure continues to be significantly elevated increase losartan and Norvasc.   DVT prophylaxis: lovenox Family Communication:none Status is: Inpatient Remains inpatient appropriate because: Strep infection will need skilled nursing facility placement.    Code Status:     Code Status  Orders  (From admission, onward)  Start     Ordered   07/17/22 1805  Do not attempt resuscitation (DNR)  Continuous       Question Answer Comment  If patient has no pulse and is not breathing Do Not Attempt Resuscitation   If patient has a pulse and/or is breathing: Medical Treatment Goals LIMITED ADDITIONAL INTERVENTIONS: Use medication/IV fluids and cardiac monitoring as indicated; Do not use intubation or mechanical ventilation (DNI), also provide comfort medications.  Transfer to Progressive/Stepdown as indicated, avoid Intensive Care.   Consent: Discussion documented in EHR or advanced directives reviewed      07/17/22 1814           Code Status History     Date Active Date Inactive Code Status Order ID Comments User Context   07/01/2021 1417 07/02/2021 1646 Full Code [id number]  Joen Laura, MD Inpatient   07/29/2019 1211 07/29/2019 2032 Full Code [id number]  Iran Ouch, MD Inpatient   02/09/2012 1136 02/10/2012 1714 Full Code [id number]  Lavina Hamman., RN Inpatient      Advance Directive Documentation    Flowsheet Row Most Recent Value  Type of Advance Directive Living will  Pre-existing out of facility DNR order (yellow form or pink MOST form) --  "MOST" Form in Place? --         IV Access:   Peripheral IV   Procedures and diagnostic studies:   No results found.   Medical Consultants:   None.   Subjective:    Caleb Barton has had bowel movement he relates his abdominal pain is improved today.  Objective:    Vitals:   07/24/22 1600 07/24/22 1728 07/24/22 2121 07/25/22 0558  BP: (!) 164/86  (!) 146/127 (!) 142/98  Pulse: 76  87 71  Resp: Temp: 99 F (37.2 C) 99.1 F (37.3 C) 99.8 F (37.7 C) 98.2 F (36.8 C)  TempSrc: Oral Oral Oral Oral  SpO2: 98%  96% 97%  Weight:      Height:       SpO2: 97 % O2 Flow Rate (L/min): 3 L/min   Intake/Output Summary (Last 24 hours) at 07/25/2022 0759 Last  data filed at 07/25/2022 3086 Gross per 24 hour  Intake 1288.98 ml  Output 1650 ml  Net -361.02 ml    Filed Weights   07/22/22 0525 07/23/22 0500 07/24/22 0500  Weight: 114.9 kg 114.3 kg 110.2 kg    Exam: General exam: In no acute distress. Respiratory system: Good air movement and clear to auscultation. Cardiovascular system: S1 & S2 heard, RRR. No JVD. Gastrointestinal system: Abdomen is nondistended, soft and nontender.  Extremities: Right knee wrapped with wound VAC in place Skin: No rashes, lesions or ulcers Psychiatry: Judgement and insight appear normal. Mood & affect appropriate. Data Reviewed:    Labs: Basic Metabolic Panel: Recent Labs  Lab 07/19/22 0451 07/20/22 0409 07/21/22 0415 07/22/22 0435 07/23/22 0429  NA 131* 129* 135 137 137  K 3.7 3.7 3.9 3.6 3.5  CL 99 98 98 97* 97*  CO2 33* 32  GLUCOSE 127* 129* 158* 133* 118*  BUN CREATININE 0.76 0.69 0.79 0.80 0.66  CALCIUM 8.1* 8.3* 8.4* 8.3* 8.1*  MG 2.2 2.3 2.3 2.5*  --     GFR Estimated Creatinine Clearance: 91.6 mL/min (by C-G formula based on SCr of 0.66 mg/dL). Liver Function Tests: Recent Labs  Lab 07/19/22 0451 07/20/22 0409 07/21/22 0415 07/22/22  0435 07/23/22 0429  AST 111* 89* 56* 73* 61*  ALT 87* 83* 72* 97* 101*  ALKPHOS 66 92 93 107 125  BILITOT 3.4* 2.8* 1.7* 1.3* 1.6*  PROT 5.8* 5.8* 5.6* 5.6* 5.9*  ALBUMIN 3.3* 3.0* 2.5* 2.6* 2.5*    No results for input(s): "LIPASE", "AMYLASE" in the last 168 hours. No results for input(s): "AMMONIA" in the last 168 hours. Coagulation profile Recent Labs  Lab 07/18/22 1759 07/19/22 0726 07/20/22 0409  INR 2.2* 1.8* 1.2    COVID-19 Labs  No results for input(s): "DDIMER", "FERRITIN", "LDH", "CRP" in the last 72 hours.  Lab Results  Component Value Date   SARSCOV2NAA NEGATIVE 07/17/2022   SARSCOV2NAA NEGATIVE 06/27/2021   SARSCOV2NAA NEGATIVE 07/27/2019    CBC: Recent Labs  Lab 07/19/22 0451  07/20/22 0409 07/21/22 0415 07/22/22 0435 07/23/22 0429  WBC 11.8* 9.5 11.0* 7.8 6.4  NEUTROABS 10.3* 8.1* 9.4* 6.0 4.3  HGB 12.6* 12.3* 11.8* 12.0* 12.5*  HCT 38.2* 36.2* 35.5* 36.3* 37.7*  MCV 97.4 96.3 98.6 98.4 97.2  PLT 101* 108* 127* 160 217    Cardiac Enzymes: Recent Labs  Lab 07/19/22 0451 07/20/22 0409  CKTOTAL 1,931* 979*    BNP (last 3 results) No results for input(s): "PROBNP" in the last 8760 hours. CBG: Recent Labs  Lab 07/20/22 0428 07/20/22 0735 07/21/22 0807 07/24/22 1001 07/25/22 0744  GLUCAP 119* 121* 130* 160* 116*    D-Dimer: No results for input(s): "DDIMER" in the last 72 hours. Hgb A1c: No results for input(s): "HGBA1C" in the last 72 hours. Lipid Profile: No results for input(s): "CHOL", "HDL", "LDLCALC", "TRIG", "CHOLHDL", "LDLDIRECT" in the last 72 hours. Thyroid function studies: No results for input(s): "TSH", "T4TOTAL", "T3FREE", "THYROIDAB" in the last 72 hours.  Invalid input(s): "FREET3" Anemia work up: No results for input(s): "VITAMINB12", "FOLATE", "FERRITIN", "TIBC", "IRON", "RETICCTPCT" in the last 72 hours. Sepsis Labs: Recent Labs  Lab 07/20/22 0409 07/21/22 0415 07/22/22 0435 07/23/22 0429  WBC 9.5 11.0* 7.8 6.4    Microbiology Recent Results (from the past 240 hour(s))  SARS Coronavirus 2 by RT PCR (hospital order, performed in [facility] hospital lab) *cepheid single result test* Anterior Nasal Swab     Status: None   Collection Time: 07/17/22  3:13 PM   Specimen: Anterior Nasal Swab  Result Value Ref Range Status   SARS Coronavirus 2 by RT PCR NEGATIVE NEGATIVE Final    Comment: (NOTE) SARS-CoV-2 target nucleic acids are NOT DETECTED.  The SARS-CoV-2 RNA is generally detectable in upper and lower respiratory specimens during the acute phase of infection. The lowest concentration of SARS-CoV-2 viral copies this assay can detect is 250 copies / mL. A negative result does not preclude SARS-CoV-2  infection and should not be used as the sole basis for treatment or other patient management decisions.  A negative result may occur with improper specimen collection / handling, submission of specimen other than nasopharyngeal swab, presence of viral mutation(s) within the areas targeted by this assay, and inadequate number of viral copies (<250 copies / mL). A negative result must be combined with clinical observations, patient history, and epidemiological information.  Fact Sheet for Patients:   RoadLapTop.co.za  Fact Sheet for Healthcare Providers: http://kim-miller.com/  This test is not yet approved or  cleared by the [place] FDA and has been authorized for detection and/or diagnosis of SARS-CoV-2 by FDA under an Emergency Use Authorization (EUA).  This EUA will remain in effect (meaning this test can  be used) for the duration of the COVID-19 declaration under Section 564(b)(1) of the Act, 21 U.S.C. section 360bbb-3(b)(1), unless the authorization is terminated or revoked sooner.  Performed at [facility], [address], [place], [place] [address]   Culture, blood (Routine X 2) w Reflex to ID Panel     Status: Abnormal   Collection Time: 07/17/22  3:31 PM   Specimen: Left Antecubital; Blood  Result Value Ref Range Status   Specimen Description   Final    LEFT ANTECUBITAL BLOOD Performed at [facility], [address], [place], [place] [address]    Special Requests   Final    BOTTLES DRAWN AEROBIC AND ANAEROBIC Blood Culture adequate volume Performed at [facility], [address] [place], [place] [address]    Culture  Setup Time   Final    GRAM POSITIVE COCCI IN CHAINS IN BOTH AEROBIC AND ANAEROBIC BOTTLES CRITICAL RESULT CALLED TO, READ BACK BY AND VERIFIED WITH: PHARMD A Weston Anna [id number] AT 815 BY CM    Culture (A)  Final    GROUP A STREP (S.PYOGENES) ISOLATED HEALTH  DEPARTMENT NOTIFIED Performed at [facility], [address] [place], [place] [address]    Report Status 07/21/2022 FINAL  Final   Organism ID, Bacteria GROUP A STREP (S.PYOGENES) ISOLATED  Final      Susceptibility   Group a strep (s.pyogenes) isolated - MIC*    PENICILLIN <=0.06 SENSITIVE Sensitive     CEFTRIAXONE <=0.12 SENSITIVE Sensitive     ERYTHROMYCIN <=0.12 SENSITIVE Sensitive     LEVOFLOXACIN 0.5 SENSITIVE Sensitive     VANCOMYCIN 0.25 SENSITIVE Sensitive     * GROUP A STREP (S.PYOGENES) ISOLATED  Blood Culture ID Panel (Reflexed)     Status: Abnormal   Collection Time: 07/17/22  3:31 PM  Result Value Ref Range Status   Enterococcus faecalis NOT DETECTED NOT DETECTED Final   Enterococcus Faecium NOT DETECTED NOT DETECTED Final   Listeria monocytogenes NOT DETECTED NOT DETECTED Final   Staphylococcus species NOT DETECTED NOT DETECTED Final   Staphylococcus aureus (BCID) NOT DETECTED NOT DETECTED Final   Staphylococcus epidermidis NOT DETECTED NOT DETECTED Final   Staphylococcus lugdunensis NOT DETECTED NOT DETECTED Final   Streptococcus species DETECTED (A) NOT DETECTED Final    Comment: CRITICAL RESULT CALLED TO, READ BACK BY AND VERIFIED WITH: PHARMD A ELLINGTON [id number] AT 815 AM BY CM    Streptococcus agalactiae NOT DETECTED NOT DETECTED Final   Streptococcus pneumoniae NOT DETECTED NOT DETECTED Final   Streptococcus pyogenes DETECTED (A) NOT DETECTED Final    Comment: CRITICAL RESULT CALLED TO, READ BACK BY AND VERIFIED WITH: PHARMD A ELLINGTON [id number] AT 815 AM BY CM    A.calcoaceticus-baumannii NOT DETECTED NOT DETECTED Final   Bacteroides fragilis NOT DETECTED NOT DETECTED Final   Enterobacterales NOT DETECTED NOT DETECTED Final   Enterobacter cloacae complex NOT DETECTED NOT DETECTED Final   Escherichia coli NOT DETECTED NOT DETECTED Final   Klebsiella aerogenes NOT DETECTED NOT DETECTED Final   Klebsiella oxytoca NOT DETECTED NOT DETECTED Final    Klebsiella pneumoniae NOT DETECTED NOT DETECTED Final   Proteus species NOT DETECTED NOT DETECTED Final   Salmonella species NOT DETECTED NOT DETECTED Final   Serratia marcescens NOT DETECTED NOT DETECTED Final   Haemophilus influenzae NOT DETECTED NOT DETECTED Final   Neisseria meningitidis NOT DETECTED NOT DETECTED Final   Pseudomonas aeruginosa NOT DETECTED NOT DETECTED Final   Stenotrophomonas  maltophilia NOT DETECTED NOT DETECTED Final   Candida albicans NOT DETECTED NOT DETECTED Final   Candida auris NOT DETECTED NOT DETECTED Final   Candida glabrata NOT DETECTED NOT DETECTED Final   Candida krusei NOT DETECTED NOT DETECTED Final   Candida parapsilosis NOT DETECTED NOT DETECTED Final   Candida tropicalis NOT DETECTED NOT DETECTED Final   Cryptococcus neoformans/gattii NOT DETECTED NOT DETECTED Final    Comment: Performed at [facility], [address] [place], [place] [address]  Culture, blood (Routine X 2) w Reflex to ID Panel     Status: Abnormal   Collection Time: 07/17/22  3:43 PM   Specimen: Right Antecubital; Blood  Result Value Ref Range Status   Specimen Description   Final    RIGHT ANTECUBITAL BLOOD Performed at [facility], [address] [place], [place] [address]    Special Requests   Final    BOTTLES DRAWN AEROBIC AND ANAEROBIC Blood Culture adequate volume Performed at [facility], [address] [place], [place] [address]    Culture  Setup Time   Final    GRAM POSITIVE COCCI IN CHAINS IN BOTH AEROBIC AND ANAEROBIC BOTTLES CRITICAL VALUE NOTED.  VALUE IS CONSISTENT WITH PREVIOUSLY REPORTED AND CALLED VALUE.    Culture (A)  Final    GROUP A STREP (S.PYOGENES) ISOLATED SUSCEPTIBILITIES PERFORMED ON PREVIOUS CULTURE WITHIN THE LAST 5 DAYS. Performed at [facility], [address] [place], [place] [address]    Report Status 07/21/2022 FINAL  Final  Body fluid culture w Gram Stain     Status: None   Collection Time:  07/18/22  2:23 PM   Specimen: Synovium; Synovial Fluid  Result Value Ref Range Status   Specimen Description   Final    SYNOVIAL R KNEE Performed at [facility], [address] [place], [place] [address]    Special Requests   Final    NONE Performed at [facility], [address] [place], [place] [address]    Gram Stain   Final    MODERATE WBC PRESENT, PREDOMINANTLY PMN RARE GRAM POSITIVE COCCI IN CHAINS INTRACELLULAR CRITICAL RESULT CALLED TO, READ BACK BY AND VERIFIED WITH: RN REBECCA FEWELL ON 07/18/22 @ 2152 BY DRT    Culture   Final    RARE GROUP A STREP (S.PYOGENES) ISOLATED SUSCEPTIBILITIES PERFORMED ON PREVIOUS CULTURE WITHIN THE LAST 5 DAYS. Performed at [facility], [address] [place], [place] [address]    Report Status 07/22/2022 FINAL  Final  Anaerobic culture w Gram Stain     Status: None   Collection Time: 07/18/22  2:23 PM   Specimen: Synovium; Synovial Fluid  Result Value Ref Range Status   Specimen Description   Final    SYNOVIAL R KNEE Performed at [facility], [address] [place], [place] [address]    Special Requests   Final    NONE Performed at [facility], [address] [place], [place] [address]    Gram Stain   Final    NO ANAEROBES ISOLATED Performed at [facility], [address] [place], [place] [address]    Culture   Final    NO ANAEROBES ISOLATED; CULTURE IN PROGRESS FOR 5 DAYS   Report Status 07/23/2022 FINAL  Final  Culture, blood (Routine X 2) w Reflex to ID Panel     Status: None   Collection Time: 07/19/22  4:49 AM   Specimen: BLOOD LEFT HAND  Result Value Ref Range Status   Specimen Description   Final    BLOOD LEFT HAND Performed at [facility], [address], [place], [place] [address]    Special Requests   Final    BOTTLES DRAWN AEROBIC ONLY Blood Culture adequate volume Performed at [facility],  [address] [place], [place] [address]    Culture   Final    NO GROWTH 5 DAYS Performed at [facility], [address] [place], [place] [address]    Report Status 07/24/2022 FINAL  Final  Culture, blood (Routine X 2) w Reflex to ID Panel     Status: None   Collection Time: 07/19/22  4:51 AM   Specimen: BLOOD RIGHT HAND  Result Value Ref Range Status   Specimen Description   Final    BLOOD RIGHT HAND Performed at [facility], [address] [place], [place] [address]    Special Requests   Final    BOTTLES DRAWN AEROBIC AND ANAEROBIC Blood Culture adequate volume Performed at [facility], [address] [place], [place] [address]    Culture   Final    NO GROWTH 5 DAYS Performed at [facility], [address], [place], [place] [address]    Report Status 07/24/2022 FINAL  Final  Aerobic/Anaerobic Culture w Gram Stain (surgical/deep wound)     Status: None (Preliminary result)   Collection Time: 07/20/22  9:41 AM   Specimen: Synovial, Right Knee; Body Fluid  Result Value Ref Range Status   Specimen Description TISSUE  Final   Special Requests RIGHT KNEE 1  Final   Gram Stain NO ORGANISMS SEEN NO WBC SEEN   Final   Culture   Final    NO GROWTH 4 DAYS NO ANAEROBES ISOLATED; CULTURE IN PROGRESS FOR 5 DAYS Performed at [facility], [address], [place], [place] [address]    Report Status PENDING  Incomplete  Aerobic/Anaerobic Culture w Gram Stain (surgical/deep wound)     Status: None (Preliminary result)   Collection Time: 07/20/22  9:42 AM   Specimen: Synovial, Right Knee; Body Fluid  Result Value Ref Range Status   Specimen Description TISSUE  Final   Special Requests RIGHT KNEE 2  Final   Gram Stain NO ORGANISMS SEEN NO WBC SEEN   Final   Culture   Final    NO GROWTH 4 DAYS NO ANAEROBES ISOLATED; CULTURE IN PROGRESS FOR 5 DAYS Performed at [facility], [address], [place], [place] [address]    Report  Status PENDING  Incomplete  Aerobic/Anaerobic Culture w Gram Stain (surgical/deep wound)     Status: None (Preliminary result)   Collection Time: 07/20/22  9:42 AM   Specimen: Synovial, Right Knee; Body Fluid  Result Value Ref Range Status   Specimen Description TISSUE  Final   Special Requests RIGHT KNEE 3  Final   Gram Stain   Final    NO ORGANISMS SEEN NO WBC SEEN Performed at [facility], [address] [place], [place] [address]    Culture   Final    CULTURE REINCUBATED FOR BETTER GROWTH NO ANAEROBES ISOLATED; CULTURE IN PROGRESS FOR 5 DAYS    Report Status PENDING  Incomplete     Medications:    amLODipine  5 mg Oral Daily   vitamin C  1,000 mg Oral Daily  Chlorhexidine Gluconate Cloth  6 each Topical Q0600   dorzolamide-timolol  1 drop Both Eyes BID   latanoprost  1 drop Both Eyes QHS   losartan  100 mg Oral QHS   multivitamin with minerals  1 tablet Oral Daily   pantoprazole  40 mg Oral Q0600   polyethylene glycol  17 g Oral BID   rivaroxaban  20 mg Oral Daily   sodium chloride flush  3 mL Intravenous Q12H   tamsulosin  0.4 mg Oral Daily   Continuous Infusions:  penicillin G potassium 12 Million Units in dextrose 5 % 500 mL CONTINUOUS infusion 41.7 mL/hr at 07/25/22 0552      LOS: 7 days   Marinda Elk  [facility]  07/25/2022, 7:59 AM

## 2022-07-25 NOTE — Progress Notes (Signed)
Occupational Therapy Treatment Patient Details Name: Caleb Barton MRN: [id number] DOB: 06/28/1940 Today's Date: 07/25/2022   History of present illness Caleb Barton is a 82 y.o. male presenting to the ED on 07/17/2022 with sudden onset lower extremity weakness and worsening low back pain.  He did fall to ground where he stayed for 3 hours until spouse could contact son who arrived and called EMS.  Pt admitted 07/17/22 for Sepsis due to group A Streptococcus without acute organ dysfunction.  Pt s/p Right knee 1 component revision poly exchange with irrigation and debridement  by orthopedics on 07/20/22.   Pt with medical history significant of paroxysmal atrial fibrillation/LAFB/RBBB, hypertension, hyperlipidemia, OSA on CPAP, remote history of thalamic CVA, BPH, history of prostate cancer status post radiation treatment approximately a year ago being followed by [facility], Dr. Annabell HowellsWrenn, ablation of the nerves done in his lower back by neurosurgery, Dr.Eichman approximately 2 weeks prior to this adission.   OT comments  Patient progressing and showed improved tolerance for 1 sit to stand from low recliner to [place] and 2 additional stands from higher [place] seat, compared to previous session. Pt does still require Max As of 3 people to stand from lower surfaces and Mod-Max As of 2 to stand from higher surfaces on Stedy with need of cues for upright posture.   Patient remains limited by RT knee pain, prolonged hospitalization/illness, generalized weakness and decreased activity tolerance along with deficits noted below. Pt now able to demonstrate good rehab potential and would benefit from continued skilled OT to increase safety and independence with ADLs and functional transfers to allow pt to return home safely and reduce caregiver burden and fall risk.    Recommendations for follow up therapy are one component of a multi-disciplinary discharge planning process, led by the attending physician.   Recommendations may be updated based on patient status, additional functional criteria and insurance authorization.    Assistance Recommended at Discharge Frequent or constant Supervision/Assistance  Patient can return home with the following  Two people to help with walking and/or transfers;Assistance with cooking/housework;Assist for transportation;Two people to help with bathing/dressing/bathroom;A lot of help with walking and/or transfers;A lot of help with bathing/dressing/bathroom;Help with stairs or ramp for entrance   Equipment Recommendations   (RW, drop arm BSC, More TBD)    Recommendations for Other Services      Precautions / Restrictions Precautions Precautions: Fall Precaution Comments: R knee wound vac Restrictions Weight Bearing Restrictions: No RLE Weight Bearing: Weight bearing as tolerated LLE Weight Bearing: Weight bearing as tolerated       Mobility Bed Mobility Overal bed mobility: Needs Assistance Bed Mobility: Sit to Supine       Sit to supine: Max assist, +2 for safety/equipment        Transfers Overall transfer level: Needs assistance Equipment used:  Antony Salmon(Stedy) Transfers: Sit to/from Stand Sit to Stand: Max assist, +2 physical assistance, +2 safety/equipment (+3)           General transfer comment: Pt stood from recliner to [place] with Max As of 3 people.  Once flaps lowered on stedy, pt descended for seated rest.  Pt took 2 more stands from elevated sitting position with Mod-Max As of 2 people.  Pt then able to use BUEs to lower to EOB with Mod As of 2 people to control descent.     Balance Overall balance assessment: Needs assistance Sitting-balance support: Feet supported, Single extremity supported Sitting balance-Leahy Scale: Fair  Standing balance support: Bilateral upper extremity supported, During functional activity, Reliant on assistive device for balance Standing balance-Leahy Scale: Poor Standing balance comment: Worked on  tactile cuing for upright posture while standing at [place]                           ADL either performed or assessed with clinical judgement   ADL Overall ADL's :  (Pt declined ADLs. Has been up in chair hours and requesting return to bed, Worked on Functional Mobility in prep of ADLs and functional Bathroom mobility)                               Toileting - Clothing Manipulation Details (indicate cue type and reason): On external catheter            Extremity/Trunk Assessment     Lower Extremity Assessment RLE Deficits / Details: wound vac right knee, dressing in place, pt preferred knee extension, flexion too painful        Vision Baseline Vision/History: 1 Wears glasses Vision Assessment?: No apparent visual deficits   Perception     Praxis      Cognition Arousal/Alertness: Awake/alert Behavior During Therapy: WFL for tasks assessed/performed Overall Cognitive Status: Within Functional Limits for tasks assessed                                 General Comments: Much more conversational, using humor appropriately.        Exercises      Shoulder Instructions       General Comments      Pertinent Vitals/ Pain       Pain Assessment Pain Assessment: 0-10 Pain Score: 5  Pain Location: right knee Pain Descriptors / Indicators: Operative site guarding, Discomfort Pain Intervention(s): Limited activity within patient's tolerance, Monitored during session, Repositioned  Home Living                                          Prior Functioning/Environment              Frequency  Min 2X/week        Progress Toward Goals  OT Goals(current goals can now be found in the care plan section)  Progress towards OT goals: Progressing toward goals     Plan Discharge plan remains appropriate    Co-evaluation                 AM-PAC OT "6 Clicks" Daily Activity     Outcome Measure   Help from  another person eating meals?: None Help from another person taking care of personal grooming?: A Little Help from another person toileting, which includes using toliet, bedpan, or urinal?: Total Help from another person bathing (including washing, rinsing, drying)?: A Lot Help from another person to put on and taking off regular upper body clothing?: A Little Help from another person to put on and taking off regular lower body clothing?: Total 6 Click Score: 14    End of Session Equipment Utilized During Treatment: Gait belt;Oxygen Antony Salmon)  OT Visit Diagnosis: Unsteadiness on feet (R26.81);Pain;Muscle weakness (generalized) (M62.81) Pain - Right/Left: Right Pain - part of body: Knee   Activity Tolerance Patient limited by pain;Patient  limited by fatigue (No increased knee pain with standing)   Patient Left in bed;with call bell/phone within reach;with bed alarm set;with family/visitor present   Nurse Communication Other (comment) (RN, Morrie Sheldon into assist with transfer)        Time: 1340-1405 OT Time Calculation (min): 25 min  Charges: OT General Charges $OT Visit: 1 Visit OT Treatments $Therapeutic Activity: 23-37 mins  Victorino Dike, OT Acute Rehab Services Office: [phone] 07/25/2022  Theodoro Clock 07/25/2022, 2:18 PM

## 2022-07-25 NOTE — Progress Notes (Signed)
Pt refused cpap for the night.  

## 2022-07-25 NOTE — Progress Notes (Signed)
Physical Therapy Treatment Patient Details Name: JAXSEN NAFTZGER MRN: [id number] DOB: [birth year] Today's Date: 07/25/2022   History of Present Illness      PT Comments    POD #5 Assisted pt with bed mobility to sit EOB by holding R LE with strap. Max assist +3 for transfer to standing. Ambulated 4 ft in room with +3 assist. Assist +3 with transfer with family behind with recliner. Did seated exercises with AAROM, Vcs, and tactile cuing for quad sets, short arc quads, and heels slides, limited by fatigue. Addressed all mobility questions, discussed appropriate activity, educated on use of ICE.    Recommendations for follow up therapy are one component of a multi-disciplinary discharge planning process, led by the attending physician.  Recommendations may be updated based on patient status, additional functional criteria and insurance authorization.  Follow Up Recommendations  Can patient physically be transported by private vehicle: No    Assistance Recommended at Discharge Frequent or constant Supervision/Assistance  Patient can return home with the following Two people to help with walking and/or transfers;A lot of help with bathing/dressing/bathroom;Assist for transportation;Help with stairs or ramp for entrance;Assistance with cooking/housework   Equipment Recommendations  None recommended by PT    Recommendations for Other Services       Precautions / Restrictions Precautions Precautions: Fall Precaution Comments: R knee wound vac Restrictions Weight Bearing Restrictions: No RLE Weight Bearing: Weight bearing as tolerated LLE Weight Bearing: Weight bearing as tolerated     Mobility  Bed Mobility Overal bed mobility: Needs Assistance Bed Mobility: Supine to Sit Rolling: Mod assist   Supine to sit: Min assist, HOB elevated     General bed mobility comments: Min A for RLE assist in/out of bed. increased time, heavy use of bedrails to lift trunk    Transfers Overall  transfer level: Needs assistance Equipment used: Rolling walker (2 wheels) Transfers: Sit to/from Stand Sit to Stand: Max assist           General transfer comment: +3 Physical assistence. Good placement of hands on bed.    Ambulation/Gait Ambulation/Gait assistance: Max assist (+3 assist) Gait Distance (Feet): 4 Feet Assistive device: Rolling walker (2 wheels) Gait Pattern/deviations: Decreased stance time - right, Decreased step length - right, Decreased weight shift to right, Knees buckling, Antalgic, Step-to pattern Gait velocity: descreased     General Gait Details: Very weak, small steps. Hips and knees buckling.   Stairs             Wheelchair Mobility    Modified Rankin (Stroke Patients Only)       Balance                                            Cognition Arousal/Alertness: Awake/alert Behavior During Therapy: WFL for tasks assessed/performed Overall Cognitive Status: Within Functional Limits for tasks assessed                                          Exercises Total Joint Exercises Ankle Circles/Pumps: AROM, Both, 10 reps, Seated Quad Sets: AAROM, Right, 5 reps, Seated Towel Squeeze: AROM, Both, 5 reps, Seated Short Arc Quad: AAROM, Right, 5 reps, Seated Heel Slides: AAROM, Right, 5 reps, Seated    General Comments  Pertinent Vitals/Pain Pain Assessment Pain Assessment: 0-10 Pain Score: 5  Pain Location: right knee Pain Descriptors / Indicators: Operative site guarding, Discomfort Pain Intervention(s): Monitored during session, Ice applied, Repositioned    Home Living                          Prior Function            PT Goals (current goals can now be found in the care plan section) Acute Rehab PT Goals PT Goal Formulation: With patient/family Time For Goal Achievement: 08/04/22 Potential to Achieve Goals: Good Progress towards PT goals: Progressing toward goals     Frequency    Min 3X/week      PT Plan Current plan remains appropriate    Co-evaluation              AM-PAC PT "6 Clicks" Mobility   Outcome Measure  Help needed turning from your back to your side while in a flat bed without using bedrails?: Total Help needed moving from lying on your back to sitting on the side of a flat bed without using bedrails?: Total Help needed moving to and from a bed to a chair (including a wheelchair)?: Total Help needed standing up from a chair using your arms (e.g., wheelchair or bedside chair)?: Total Help needed to walk in hospital room?: Total Help needed climbing 3-5 steps with a railing? : Total 6 Click Score: 6    End of Session Equipment Utilized During Treatment: Gait belt Activity Tolerance: Patient limited by pain;Patient limited by fatigue Patient left: in chair;with family/visitor present;with call bell/phone within reach   PT Visit Diagnosis: Difficulty in walking, not elsewhere classified (R26.2);Muscle weakness (generalized) (M62.81)     Time: 4665-9935 PT Time Calculation (min) (ACUTE ONLY): 31 min  Charges:  $Gait Training: 8-22 mins $Therapeutic Activity: 8-22 mins                     Sharlene Motts, SPTA

## 2022-07-25 NOTE — Plan of Care (Signed)
  Problem: Activity: Goal: Ability to tolerate increased activity will improve Outcome: Completed/Met   Problem: Activity: Goal: Risk for activity intolerance will decrease Outcome: Progressing

## 2022-07-26 DIAGNOSIS — R7881 Bacteremia: Secondary | ICD-10-CM | POA: Diagnosis not present

## 2022-07-26 DIAGNOSIS — B955 Unspecified streptococcus as the cause of diseases classified elsewhere: Secondary | ICD-10-CM | POA: Diagnosis not present

## 2022-07-26 LAB — AEROBIC/ANAEROBIC CULTURE W GRAM STAIN (SURGICAL/DEEP WOUND)
Culture: NO GROWTH
Culture: NO GROWTH
Gram Stain: NONE SEEN
Gram Stain: NONE SEEN
Gram Stain: NONE SEEN

## 2022-07-26 LAB — GLUCOSE, CAPILLARY: Glucose-Capillary: 102 mg/dL — ABNORMAL HIGH (ref 70–99)

## 2022-07-26 MED ORDER — RIVAROXABAN 20 MG PO TABS
20.0000 mg | ORAL_TABLET | Freq: Every day | ORAL | Status: DC
  Administered 2022-07-26 – 2022-07-27 (×2): 20 mg via ORAL
  Filled 2022-07-26 (×2): qty 1

## 2022-07-26 NOTE — Plan of Care (Signed)
  Problem: Respiratory: Goal: Ability to maintain adequate ventilation will improve Outcome: Progressing Goal: Ability to maintain a clear airway will improve Outcome: Progressing   Problem: Nutrition: Goal: Adequate nutrition will be maintained Outcome: Progressing   Problem: Coping: Goal: Level of anxiety will decrease Outcome: Progressing   Problem: Elimination: Goal: Will not experience complications related to urinary retention Outcome: Progressing   Problem: Pain Managment: Goal: General experience of comfort will improve Outcome: Progressing   Problem: Safety: Goal: Ability to remain free from injury will improve Outcome: Progressing   Problem: Skin Integrity: Goal: Risk for impaired skin integrity will decrease Outcome: Progressing

## 2022-07-26 NOTE — Progress Notes (Signed)
Pt refused cpap said its too bulky and he would use his home one when his wife will bring it. Machine remained bedside if needed.

## 2022-07-26 NOTE — Progress Notes (Signed)
[facility] PROGRESS NOTE    Progress Note  Caleb Barton  ZOX:[id number] DOB: 10-Nov-1940 DOA: 07/17/2022 PCP: Shon Hale, MD     Brief Narrative:   Caleb Barton is an 82 y.o. male past medical history of paroxysmal atrial fibrillation on anticoagulation, history of CVA, prostate cancer status post radiation treatment comes in with sudden onset of lower extremity weakness and worsening back pain.  He status post ablation 2 weeks ago by neurosurgery for back pain, he reports sudden onset of lower extremity weakness approximately 3 hours prior to admission.  MRI of the lumbar spine reviewed by neurosurgery who feels he does not need any surgical intervention at this time and feels patient's weakness is not due to the lumbar spine, he was noted to spike a fever in the ED chest x-ray concerning for bilateral infiltrates, blood cultures grew strep pyogenes, he was placed empirically on antibiotics, knee x-ray showed an effusion ID and orthopedic surgery was consulted as there was a concern of seeding of the prosthetic joint underwent aspiration which was positive for Streptococcus group A.  Surveillance blood cultures have been negative till date.  Assessment/Plan:   Sepsis due to group A Streptococcus without acute organ dysfunction Blood cultures were positive for strep pyogenes of unclear source. 2D echo showed no vegetation. Surveillance blood cultures remain negative till date. ID recommended IV penicillin. ID recommended 6 weeks of IV antibiotics with start date on 07/21/2022, once antibiotics are completed IV ID will follow-up and will decide on suppressive therapy. PICC line was placed on 07/22/2022. Scheduled for ID follow-up on 08/12/2022. PT evaluated the patient currently awaiting authorization approval.  Right prosthetic knee infection: Orthopedic surgery was consulted underwent aspiration cultures grew Streptococcus group A. Orthopedic surgery recommended a washout  with liner exchange done on 07/20/2022. Cultures and sensitivities are back, ID recommended IV penicillins for 6 weeks. Right knee is wrapped currently with wound VAC. Follow-up with orthopedic and ID as an outpatient. PT evaluated the patient, will need skilled nursing facility placement. Orthopedic surgery recommended Xarelto 10 mg postop. They also recommended to follow-up with them in 2 weeks as an outpatient.  Questionable pneumonia: Has completed course of antibiotics in house.  Mild rhabdomyolysis: Probably secondary to fall resolved with IV fluids.  Transaminitis: Acute hepatitis panel B was reactive will need to follow-up with ID as an outpatient.  Paroxysmal atrial fibrillation: Rate controlled continue Xarelto.  Bilateral lower extremity weakness/low back pain present on admission: Probably secondary to sepsis. MRI of the L-spine was done that showed no acute findings EDP discussed with neurosurgery did not feel any surgical intervention was needed and also recommended PT OT. The case was discussed with Dr. Weber Cooks who graciously agreed to see the patient and reviewed the film recommended no further intervention. Statins were held on admission. PT OT evaluated the patient awaiting skilled nursing facility placement.  BPH: Continue Flomax.  Hyperlipidemia: Holding statin due to rhabdomyolysis and lower extremity weakness can be resumed as an outpatient by PCP.  Dehydration: Appears euvolemic continue to hold hydrochlorothiazide.  Essential hypertension: Blood pressure has been slowly trending up.  He received several days of IV Lasix now is soft.. Blood pressure continues to be significantly elevated increase losartan and Norvasc.   DVT prophylaxis: lovenox Family Communication:none Status is: Inpatient Remains inpatient appropriate because: Strep infection will need skilled nursing facility placement.    Code Status:     Code Status Orders  (From  admission, onward)  Start     Ordered   07/17/22 1805  Do not attempt resuscitation (DNR)  Continuous       Question Answer Comment  If patient has no pulse and is not breathing Do Not Attempt Resuscitation   If patient has a pulse and/or is breathing: Medical Treatment Goals LIMITED ADDITIONAL INTERVENTIONS: Use medication/IV fluids and cardiac monitoring as indicated; Do not use intubation or mechanical ventilation (DNI), also provide comfort medications.  Transfer to Progressive/Stepdown as indicated, avoid Intensive Care.   Consent: Discussion documented in EHR or advanced directives reviewed      07/17/22 1814           Code Status History     Date Active Date Inactive Code Status Order ID Comments User Context   07/01/2021 1417 07/02/2021 1646 Full Code [id number]  Joen Laura, MD Inpatient   07/29/2019 1211 07/29/2019 2032 Full Code [id number]  Iran Ouch, MD Inpatient   02/09/2012 1136 02/10/2012 1714 Full Code [id number]  Lavina Hamman., RN Inpatient      Advance Directive Documentation    Flowsheet Row Most Recent Value  Type of Advance Directive Living will  Pre-existing out of facility DNR order (yellow form or pink MOST form) --  "MOST" Form in Place? --         IV Access:   Peripheral IV   Procedures and diagnostic studies:   No results found.   Medical Consultants:   None.   Subjective:    Caleb Barton had a bowel movement pain is improved.  Objective:    Vitals:   07/25/22 1347 07/25/22 2043 07/26/22 0541 07/26/22 0845  BP: (!) 158/89 136/72 (!) 147/83 (!) 138/59  Pulse: 84 82 80 82  Resp: 18 20 20 19   Temp: 98.6 F (37 C) 98.5 F (36.9 C) 98.1 F (36.7 C) 98.3 F (36.8 C)  TempSrc: Oral Oral Oral Oral  SpO2: 96% 93% 94% 95%  Weight:   120.2 kg   Height:       SpO2: 95 % O2 Flow Rate (L/min): 3 L/min   Intake/Output Summary (Last 24 hours) at 07/26/2022 0909 Last data filed at 07/26/2022  0835 Gross per 24 hour  Intake 1894.09 ml  Output 3050 ml  Net -1155.91 ml    Filed Weights   07/24/22 0500 07/25/22 0704 07/26/22 0541  Weight: 110.2 kg 118.9 kg 120.2 kg    Exam: General exam: In no acute distress. Respiratory system: Good air movement and clear to auscultation. Cardiovascular system: S1 & S2 heard, RRR. No JVD. Gastrointestinal system: Abdomen is nondistended, soft and nontender.  Extremities: No pedal edema. Skin: No rashes, lesions or ulcers Psychiatry: Judgement and insight appear normal. Mood & affect appropriate. Data Reviewed:    Labs: Basic Metabolic Panel: Recent Labs  Lab 07/20/22 0409 07/21/22 0415 07/22/22 0435 07/23/22 0429  NA 129* 135 137 137  K 3.7 3.9 3.6 3.5  CL 98 98 97* 97*  CO2 24 30 33* 32  GLUCOSE 129* 158* 133* 118*  BUN 21 23 23 17   CREATININE 0.69 0.79 0.80 0.66  CALCIUM 8.3* 8.4* 8.3* 8.1*  MG 2.3 2.3 2.5*  --     GFR Estimated Creatinine Clearance: 95.8 mL/min (by C-G formula based on SCr of 0.66 mg/dL). Liver Function Tests: Recent Labs  Lab 07/20/22 0409 07/21/22 0415 07/22/22 0435 07/23/22 0429  AST 89* 56* 73* 61*  ALT 83* 72* 97* 101*  ALKPHOS 92 93 107 125  BILITOT 2.8* 1.7* 1.3* 1.6*  PROT 5.8* 5.6* 5.6* 5.9*  ALBUMIN 3.0* 2.5* 2.6* 2.5*    No results for input(s): "LIPASE", "AMYLASE" in the last 168 hours. No results for input(s): "AMMONIA" in the last 168 hours. Coagulation profile Recent Labs  Lab 07/20/22 0409  INR 1.2    COVID-19 Labs  No results for input(s): "DDIMER", "FERRITIN", "LDH", "CRP" in the last 72 hours.  Lab Results  Component Value Date   SARSCOV2NAA NEGATIVE 07/17/2022   SARSCOV2NAA NEGATIVE 06/27/2021   SARSCOV2NAA NEGATIVE 07/27/2019    CBC: Recent Labs  Lab 07/20/22 0409 07/21/22 0415 07/22/22 0435 07/23/22 0429  WBC 9.5 11.0* 7.8 6.4  NEUTROABS 8.1* 9.4* 6.0 4.3  HGB 12.3* 11.8* 12.0* 12.5*  HCT 36.2* 35.5* 36.3* 37.7*  MCV 96.3 98.6 98.4 97.2  PLT  108* 127* 160 217    Cardiac Enzymes: Recent Labs  Lab 07/20/22 0409  CKTOTAL 979*    BNP (last 3 results) No results for input(s): "PROBNP" in the last 8760 hours. CBG: Recent Labs  Lab 07/20/22 0735 07/21/22 0807 07/24/22 1001 07/25/22 0744 07/26/22 0742  GLUCAP 121* 130* 160* 116* 102*    D-Dimer: No results for input(s): "DDIMER" in the last 72 hours. Hgb A1c: No results for input(s): "HGBA1C" in the last 72 hours. Lipid Profile: No results for input(s): "CHOL", "HDL", "LDLCALC", "TRIG", "CHOLHDL", "LDLDIRECT" in the last 72 hours. Thyroid function studies: No results for input(s): "TSH", "T4TOTAL", "T3FREE", "THYROIDAB" in the last 72 hours.  Invalid input(s): "FREET3" Anemia work up: No results for input(s): "VITAMINB12", "FOLATE", "FERRITIN", "TIBC", "IRON", "RETICCTPCT" in the last 72 hours. Sepsis Labs: Recent Labs  Lab 07/20/22 0409 07/21/22 0415 07/22/22 0435 07/23/22 0429  WBC 9.5 11.0* 7.8 6.4    Microbiology Recent Results (from the past 240 hour(s))  SARS Coronavirus 2 by RT PCR (hospital order, performed in [facility] hospital lab) *cepheid single result test* Anterior Nasal Swab     Status: None   Collection Time: 07/17/22  3:13 PM   Specimen: Anterior Nasal Swab  Result Value Ref Range Status   SARS Coronavirus 2 by RT PCR NEGATIVE NEGATIVE Final    Comment: (NOTE) SARS-CoV-2 target nucleic acids are NOT DETECTED.  The SARS-CoV-2 RNA is generally detectable in upper and lower respiratory specimens during the acute phase of infection. The lowest concentration of SARS-CoV-2 viral copies this assay can detect is 250 copies / mL. A negative result does not preclude SARS-CoV-2 infection and should not be used as the sole basis for treatment or other patient management decisions.  A negative result may occur with improper specimen collection / handling, submission of specimen other than nasopharyngeal swab, presence of viral mutation(s)  within the areas targeted by this assay, and inadequate number of viral copies (<250 copies / mL). A negative result must be combined with clinical observations, patient history, and epidemiological information.  Fact Sheet for Patients:   RoadLapTop.co.za  Fact Sheet for Healthcare Providers: http://kim-miller.com/  This test is not yet approved or  cleared by the [place] FDA and has been authorized for detection and/or diagnosis of SARS-CoV-2 by FDA under an Emergency Use Authorization (EUA).  This EUA will remain in effect (meaning this test can be used) for the duration of the COVID-19 declaration under Section 564(b)(1) of the Act, 21 U.S.C. section 360bbb-3(b)(1), unless the authorization is terminated or revoked sooner.  Performed at [facility], [address] [place], [place] [address]   Culture, blood (  Routine X 2) w Reflex to ID Panel     Status: Abnormal   Collection Time: 07/17/22  3:31 PM   Specimen: Left Antecubital; Blood  Result Value Ref Range Status   Specimen Description   Final    LEFT ANTECUBITAL BLOOD Performed at [facility], [address] [place], [place] [address]    Special Requests   Final    BOTTLES DRAWN AEROBIC AND ANAEROBIC Blood Culture adequate volume Performed at [facility], [address] [place], [place] [address]    Culture  Setup Time   Final    GRAM POSITIVE COCCI IN CHAINS IN BOTH AEROBIC AND ANAEROBIC BOTTLES CRITICAL RESULT CALLED TO, READ BACK BY AND VERIFIED WITH: PHARMD A Weston AnnaELLINGTON [phone] AT 815 BY CM    Culture (A)  Final    GROUP A STREP (S.PYOGENES) ISOLATED HEALTH DEPARTMENT NOTIFIED Performed at [facility], [address] [place], [place] [address]    Report Status 07/21/2022 FINAL  Final   Organism ID, Bacteria GROUP A STREP (S.PYOGENES) ISOLATED  Final      Susceptibility   Group a strep (s.pyogenes) isolated  - MIC*    PENICILLIN <=0.06 SENSITIVE Sensitive     CEFTRIAXONE <=0.12 SENSITIVE Sensitive     ERYTHROMYCIN <=0.12 SENSITIVE Sensitive     LEVOFLOXACIN 0.5 SENSITIVE Sensitive     VANCOMYCIN 0.25 SENSITIVE Sensitive     * GROUP A STREP (S.PYOGENES) ISOLATED  Blood Culture ID Panel (Reflexed)     Status: Abnormal   Collection Time: 07/17/22  3:31 PM  Result Value Ref Range Status   Enterococcus faecalis NOT DETECTED NOT DETECTED Final   Enterococcus Faecium NOT DETECTED NOT DETECTED Final   Listeria monocytogenes NOT DETECTED NOT DETECTED Final   Staphylococcus species NOT DETECTED NOT DETECTED Final   Staphylococcus aureus (BCID) NOT DETECTED NOT DETECTED Final   Staphylococcus epidermidis NOT DETECTED NOT DETECTED Final   Staphylococcus lugdunensis NOT DETECTED NOT DETECTED Final   Streptococcus species DETECTED (A) NOT DETECTED Final    Comment: CRITICAL RESULT CALLED TO, READ BACK BY AND VERIFIED WITH: PHARMD A ELLINGTON [phone] AT 815 AM BY CM    Streptococcus agalactiae NOT DETECTED NOT DETECTED Final   Streptococcus pneumoniae NOT DETECTED NOT DETECTED Final   Streptococcus pyogenes DETECTED (A) NOT DETECTED Final    Comment: CRITICAL RESULT CALLED TO, READ BACK BY AND VERIFIED WITH: PHARMD A ELLINGTON [phone] AT 815 AM BY CM    A.calcoaceticus-baumannii NOT DETECTED NOT DETECTED Final   Bacteroides fragilis NOT DETECTED NOT DETECTED Final   Enterobacterales NOT DETECTED NOT DETECTED Final   Enterobacter cloacae complex NOT DETECTED NOT DETECTED Final   Escherichia coli NOT DETECTED NOT DETECTED Final   Klebsiella aerogenes NOT DETECTED NOT DETECTED Final   Klebsiella oxytoca NOT DETECTED NOT DETECTED Final   Klebsiella pneumoniae NOT DETECTED NOT DETECTED Final   Proteus species NOT DETECTED NOT DETECTED Final   Salmonella species NOT DETECTED NOT DETECTED Final   Serratia marcescens NOT DETECTED NOT DETECTED Final   Haemophilus influenzae NOT DETECTED NOT DETECTED Final    Neisseria meningitidis NOT DETECTED NOT DETECTED Final   Pseudomonas aeruginosa NOT DETECTED NOT DETECTED Final   Stenotrophomonas maltophilia NOT DETECTED NOT DETECTED Final   Candida albicans NOT DETECTED NOT DETECTED Final   Candida auris NOT DETECTED NOT DETECTED Final   Candida glabrata NOT DETECTED NOT DETECTED Final   Candida krusei NOT DETECTED NOT DETECTED Final   Candida  parapsilosis NOT DETECTED NOT DETECTED Final   Candida tropicalis NOT DETECTED NOT DETECTED Final   Cryptococcus neoformans/gattii NOT DETECTED NOT DETECTED Final    Comment: Performed at [facility], [address] [place], [place] [address]  Culture, blood (Routine X 2) w Reflex to ID Panel     Status: Abnormal   Collection Time: 07/17/22  3:43 PM   Specimen: Right Antecubital; Blood  Result Value Ref Range Status   Specimen Description   Final    RIGHT ANTECUBITAL BLOOD Performed at [facility], [address] [place], [place] [address]    Special Requests   Final    BOTTLES DRAWN AEROBIC AND ANAEROBIC Blood Culture adequate volume Performed at [facility], [address] [place], [place] [address]    Culture  Setup Time   Final    GRAM POSITIVE COCCI IN CHAINS IN BOTH AEROBIC AND ANAEROBIC BOTTLES CRITICAL VALUE NOTED.  VALUE IS CONSISTENT WITH PREVIOUSLY REPORTED AND CALLED VALUE.    Culture (A)  Final    GROUP A STREP (S.PYOGENES) ISOLATED SUSCEPTIBILITIES PERFORMED ON PREVIOUS CULTURE WITHIN THE LAST 5 DAYS. Performed at [place] [facility], [address], [place], [place] [address]    Report Status 07/21/2022 FINAL  Final  Body fluid culture w Gram Stain     Status: None   Collection Time: 07/18/22  2:23 PM   Specimen: Synovium; Synovial Fluid  Result Value Ref Range Status   Specimen Description   Final    SYNOVIAL R KNEE Performed at [facility], [address] [place], [place] [address]    Special Requests   Final     NONE Performed at [facility], [address], [place], [place] [address]    Gram Stain   Final    MODERATE WBC PRESENT, PREDOMINANTLY PMN RARE GRAM POSITIVE COCCI IN CHAINS INTRACELLULAR CRITICAL RESULT CALLED TO, READ BACK BY AND VERIFIED WITH: RN REBECCA FEWELL ON 07/18/22 @ 2152 BY DRT    Culture   Final    RARE GROUP A STREP (S.PYOGENES) ISOLATED SUSCEPTIBILITIES PERFORMED ON PREVIOUS CULTURE WITHIN THE LAST 5 DAYS. Performed at [facility], [address] [place], [place] [address]    Report Status 07/22/2022 FINAL  Final  Anaerobic culture w Gram Stain     Status: None   Collection Time: 07/18/22  2:23 PM   Specimen: Synovium; Synovial Fluid  Result Value Ref Range Status   Specimen Description   Final    SYNOVIAL R KNEE Performed at [facility], [address] [place], [place] [address]    Special Requests   Final    NONE Performed at [facility], [address], [place], [place] [address]    Gram Stain   Final    NO ANAEROBES ISOLATED Performed at [facility], [address] [place], [place] [address]    Culture   Final    NO ANAEROBES ISOLATED; CULTURE IN PROGRESS FOR 5 DAYS   Report Status 07/23/2022 FINAL  Final  Culture, blood (Routine X 2) w Reflex to ID Panel     Status: None   Collection Time: 07/19/22  4:49 AM   Specimen: BLOOD LEFT HAND  Result Value Ref Range Status   Specimen Description   Final    BLOOD LEFT HAND Performed at [facility], [address] [place], [place] [address]    Special  Requests   Final    BOTTLES DRAWN AEROBIC ONLY Blood Culture adequate volume Performed at [facility], [address] [place], [place] [address]    Culture   Final    NO GROWTH 5 DAYS Performed at [facility], [address], [place], [place] [address]    Report Status 07/24/2022 FINAL  Final  Culture, blood (Routine X 2) w Reflex to ID Panel      Status: None   Collection Time: 07/19/22  4:51 AM   Specimen: BLOOD RIGHT HAND  Result Value Ref Range Status   Specimen Description   Final    BLOOD RIGHT HAND Performed at [facility], [address] [place], [place] [address]    Special Requests   Final    BOTTLES DRAWN AEROBIC AND ANAEROBIC Blood Culture adequate volume Performed at [facility], [address] [place], [place] [address]    Culture   Final    NO GROWTH 5 DAYS Performed at [facility], [address], [place], [place] [address]    Report Status 07/24/2022 FINAL  Final  Aerobic/Anaerobic Culture w Gram Stain (surgical/deep wound)     Status: None (Preliminary result)   Collection Time: 07/20/22  9:41 AM   Specimen: Synovial, Right Knee; Body Fluid  Result Value Ref Range Status   Specimen Description TISSUE  Final   Special Requests RIGHT KNEE 1  Final   Gram Stain NO ORGANISMS SEEN NO WBC SEEN   Final   Culture   Final    NO GROWTH 5 DAYS NO ANAEROBES ISOLATED; CULTURE IN PROGRESS FOR 5 DAYS Performed at [facility], [address] [place], [place] [address]    Report Status PENDING  Incomplete  Aerobic/Anaerobic Culture w Gram Stain (surgical/deep wound)     Status: None (Preliminary result)   Collection Time: 07/20/22  9:42 AM   Specimen: Synovial, Right Knee; Body Fluid  Result Value Ref Range Status   Specimen Description TISSUE  Final   Special Requests RIGHT KNEE 2  Final   Gram Stain NO ORGANISMS SEEN NO WBC SEEN   Final   Culture   Final    NO GROWTH 5 DAYS NO ANAEROBES ISOLATED; CULTURE IN PROGRESS FOR 5 DAYS Performed at [facility], [address], [place], [place] [address]    Report Status PENDING  Incomplete  Aerobic/Anaerobic Culture w Gram Stain (surgical/deep wound)     Status: None (Preliminary result)   Collection Time: 07/20/22  9:42 AM   Specimen: Synovial, Right Knee; Body Fluid  Result Value Ref Range Status   Specimen  Description TISSUE  Final   Special Requests RIGHT KNEE 3  Final   Gram Stain   Final    NO ORGANISMS SEEN NO WBC SEEN Performed at [facility], [address] [place], [place] [address]    Culture   Final    RARE GROUP A STREP (S.PYOGENES) ISOLATED Beta hemolytic streptococci are predictably susceptible to penicillin and other beta lactams. Susceptibility testing not routinely performed. NO ANAEROBES ISOLATED; CULTURE IN PROGRESS FOR 5 DAYS    Report Status PENDING  Incomplete     Medications:    amLODipine  5 mg Oral Daily   vitamin C  1,000 mg Oral Daily   Chlorhexidine Gluconate Cloth  6 each Topical Q0600   dorzolamide-timolol  1 drop Both Eyes BID   latanoprost  1 drop Both  Eyes QHS   losartan  100 mg Oral QHS   multivitamin with minerals  1 tablet Oral Daily   pantoprazole  40 mg Oral Q0600   rivaroxaban  20 mg Oral Daily   sodium chloride flush  3 mL Intravenous Q12H   tamsulosin  0.4 mg Oral Daily   Continuous Infusions:  penicillin G potassium 12 Million Units in dextrose 5 % 500 mL CONTINUOUS infusion 12 Million Units (07/25/22 2331)      LOS: 8 days   Marinda Elk  [facility]  07/26/2022, 9:09 AM

## 2022-07-26 NOTE — TOC Progression Note (Signed)
Transition of Care [facility]) - Progression Note    Patient Details  Name: Caleb Barton MRN: [id number] Date of Birth: 1940-12-09  [facility] of [facility] CM/SW Contact  Georgie Chard, [place] Phone Number: 07/26/2022, 3:29 PM  Clinical Narrative:    At this time [name] is still pending.   Expected Discharge Plan: Skilled Nursing Facility Barriers to Discharge: Continued Medical Work up  Expected Discharge Plan and Services       Living arrangements for the past 2 months: Single Family Home                                       Social Determinants of Health (SDOH) Interventions SDOH Screenings   Food Insecurity: No Food Insecurity (07/18/2022)  Housing: Low Risk  (07/18/2022)  Transportation Needs: No Transportation Needs (07/18/2022)  Utilities: Not At Risk (07/18/2022)  Tobacco Use: Medium Risk (07/21/2022)    Readmission Risk Interventions     No data to display

## 2022-07-27 DIAGNOSIS — R7881 Bacteremia: Secondary | ICD-10-CM | POA: Diagnosis not present

## 2022-07-27 DIAGNOSIS — B955 Unspecified streptococcus as the cause of diseases classified elsewhere: Secondary | ICD-10-CM | POA: Diagnosis not present

## 2022-07-27 LAB — GLUCOSE, CAPILLARY: Glucose-Capillary: 103 mg/dL — ABNORMAL HIGH (ref 70–99)

## 2022-07-27 MED ORDER — SODIUM CHLORIDE 0.9% FLUSH: 10.0000 mL | INTRAVENOUS | Status: DC | PRN

## 2022-07-27 NOTE — Progress Notes (Signed)
[facility] PROGRESS NOTE    Progress Note  Caleb Barton  ZOX:[id number] DOB: 02/17/1941 DOA: 07/17/2022 PCP: Caleb Barton, Caleb S, MD     Brief Narrative:   Caleb Barton is an 82 y.o. male past medical history of paroxysmal atrial fibrillation on anticoagulation, history of CVA, prostate cancer status post radiation treatment comes in with sudden onset of lower extremity weakness and worsening back pain.  He status post ablation 2 weeks ago by neurosurgery for back pain, he reports sudden onset of lower extremity weakness approximately 3 hours prior to admission.  MRI of the lumbar spine reviewed by neurosurgery who feels he does not need any surgical intervention at this time and feels patient's weakness is not due to the lumbar spine, he was noted to spike a fever in the ED chest x-ray concerning for bilateral infiltrates, blood cultures grew strep pyogenes, he was placed empirically on antibiotics, knee x-ray showed an effusion ID and orthopedic surgery was consulted as there was a concern of seeding of the prosthetic joint underwent aspiration which was positive for Streptococcus group A.  Surveillance blood cultures have been negative till date.  Assessment/Plan:   Sepsis due to group A Streptococcus without acute organ dysfunction ID recommended 6 weeks of IV antibiotics with start date on 07/21/2022, once antibiotics are completed IV ID will follow-up and will decide on suppressive therapy. PICC line was placed on 07/22/2022. Scheduled for ID follow-up on 08/12/2022. PT evaluated the patient currently awaiting authorization approval.  Right prosthetic knee infection: Cultures and sensitivities are back, ID recommended IV penicillins for 6 weeks. Right knee is wrapped currently with wound VAC. Follow-up with orthopedic and ID as an outpatient. PT evaluated the patient, will need skilled nursing facility placement. Orthopedic surgery recommended Xarelto 10 mg postop. They also  recommended to follow-up with them in 2 weeks as an outpatient.  Questionable pneumonia: Has completed course of antibiotics in house.  Mild rhabdomyolysis: Probably secondary to fall resolved with IV fluids.  Transaminitis: Acute hepatitis panel B was reactive will need to follow-up with ID as an outpatient.  Paroxysmal atrial fibrillation: Rate controlled continue Xarelto.  Bilateral lower extremity weakness/low back pain present on admission: Probably secondary to sepsis. The case was discussed with Caleb Barton who graciously agreed to see the patient and reviewed the film recommended no further intervention. Statins were held on admission. PT OT evaluated the patient awaiting skilled nursing facility placement.  BPH: Continue Flomax.  Hyperlipidemia: Holding statin due to rhabdomyolysis and lower extremity weakness can be resumed as an outpatient by PCP.  Dehydration: Appears euvolemic continue to hold hydrochlorothiazide.  Essential hypertension: Blood pressure has been slowly trending up.  He received several days of IV Lasix now is soft.. Blood pressure continues to be significantly elevated increase losartan and Norvasc.   DVT prophylaxis: lovenox Family Communication:none Status is: Inpatient Remains inpatient appropriate because: Strep infection will need skilled nursing facility placement.    Code Status:     Code Status Orders  (From admission, onward)           Start     Ordered   07/17/22 1805  Do not attempt resuscitation (DNR)  Continuous       Question Answer Comment  If patient has no pulse and is not breathing Do Not Attempt Resuscitation   If patient has a pulse and/or is breathing: Medical Treatment Goals LIMITED ADDITIONAL INTERVENTIONS: Use medication/IV fluids and cardiac monitoring as indicated; Do not use intubation or mechanical  ventilation (DNI), also provide comfort medications.  Transfer to Progressive/Stepdown as indicated, avoid  Intensive Care.   Consent: Discussion documented in EHR or advanced directives reviewed      07/17/22 1814           Code Status History     Date Active Date Inactive Code Status Order ID Comments User Context   07/01/2021 1417 07/02/2021 1646 Full Code [id number]  Joen Laura, MD Inpatient   07/29/2019 1211 07/29/2019 2032 Full Code [id number]  Iran Ouch, MD Inpatient   02/09/2012 1136 02/10/2012 1714 Full Code [id number]  Lavina Hamman., RN Inpatient      Advance Directive Documentation    Flowsheet Row Most Recent Value  Type of Advance Directive Living will  Pre-existing out of facility DNR order (yellow form or pink MOST form) --  "MOST" Form in Place? --         IV Access:   Peripheral IV   Procedures and diagnostic studies:   No results found.   Medical Consultants:   None.   Subjective:    Caleb Barton no complaints had a bowel movement.  Objective:    Vitals:   07/26/22 0541 07/26/22 0845 07/26/22 1200 07/26/22 2058  BP: (!) 147/83 (!) 138/59 (!) 153/75 (!) 142/83  Pulse: 80 82 72 81  Resp: 20 19 18 19   Temp: 98.1 F (36.7 C) 98.3 F (36.8 C) (!) 97.5 F (36.4 C) 98.9 F (37.2 C)  TempSrc: Oral Oral Oral Oral  SpO2: 94% 95% 97% 94%  Weight: 120.2 kg     Height:       SpO2: 94 % O2 Flow Rate (L/min): 3 L/min   Intake/Output Summary (Last 24 hours) at 07/27/2022 0842 Last data filed at 07/27/2022 0800 Gross per 24 hour  Intake 1764.66 ml  Output 4300 ml  Net -2535.34 ml    Filed Weights   07/24/22 0500 07/25/22 0704 07/26/22 0541  Weight: 110.2 kg 118.9 kg 120.2 kg    Exam: General exam: In no acute distress. Respiratory system: Good air movement and clear to auscultation. Cardiovascular system: S1 & S2 heard, RRR. No JVD. Gastrointestinal system: Abdomen is nondistended, soft and nontender.  Extremities: No pedal edema. Skin: No rashes, lesions or ulcers Psychiatry: Judgement and insight appear  normal. Mood & affect appropriate. Data Reviewed:    Labs: Basic Metabolic Panel: Recent Labs  Lab 07/21/22 0415 07/22/22 0435 07/23/22 0429  NA 135 137 137  K 3.9 3.6 3.5  CL 98 97* 97*  CO2 30 33* 32  GLUCOSE 158* 133* 118*  BUN 23 23 17   CREATININE 0.79 0.80 0.66  CALCIUM 8.4* 8.3* 8.1*  MG 2.3 2.5*  --     GFR Estimated Creatinine Clearance: 95.8 mL/min (by C-G formula based on SCr of 0.66 mg/dL). Liver Function Tests: Recent Labs  Lab 07/21/22 0415 07/22/22 0435 07/23/22 0429  AST 56* 73* 61*  ALT 72* 97* 101*  ALKPHOS 93 107 125  BILITOT 1.7* 1.3* 1.6*  PROT 5.6* 5.6* 5.9*  ALBUMIN 2.5* 2.6* 2.5*    No results for input(s): "LIPASE", "AMYLASE" in the last 168 hours. No results for input(s): "AMMONIA" in the last 168 hours. Coagulation profile No results for input(s): "INR", "PROTIME" in the last 168 hours.  COVID-19 Labs  No results for input(s): "DDIMER", "FERRITIN", "LDH", "CRP" in the last 72 hours.  Lab Results  Component Value Date   SARSCOV2NAA NEGATIVE 07/17/2022   SARSCOV2NAA NEGATIVE 06/27/2021  SARSCOV2NAA NEGATIVE 07/27/2019    CBC: Recent Labs  Lab 07/21/22 0415 07/22/22 0435 07/23/22 0429  WBC 11.0* 7.8 6.4  NEUTROABS 9.4* 6.0 4.3  HGB 11.8* 12.0* 12.5*  HCT 35.5* 36.3* 37.7*  MCV 98.6 98.4 97.2  PLT 127* 160 217    Cardiac Enzymes: No results for input(s): "CKTOTAL", "CKMB", "CKMBINDEX", "TROPONINI" in the last 168 hours.  BNP (last 3 results) No results for input(s): "PROBNP" in the last 8760 hours. CBG: Recent Labs  Lab 07/21/22 0807 07/24/22 1001 07/25/22 0744 07/26/22 0742 07/27/22 0744  GLUCAP 130* 160* 116* 102* 103*    D-Dimer: No results for input(s): "DDIMER" in the last 72 hours. Hgb A1c: No results for input(s): "HGBA1C" in the last 72 hours. Lipid Profile: No results for input(s): "CHOL", "HDL", "LDLCALC", "TRIG", "CHOLHDL", "LDLDIRECT" in the last 72 hours. Thyroid function studies: No results  for input(s): "TSH", "T4TOTAL", "T3FREE", "THYROIDAB" in the last 72 hours.  Invalid input(s): "FREET3" Anemia work up: No results for input(s): "VITAMINB12", "FOLATE", "FERRITIN", "TIBC", "IRON", "RETICCTPCT" in the last 72 hours. Sepsis Labs: Recent Labs  Lab 07/21/22 0415 07/22/22 0435 07/23/22 0429  WBC 11.0* 7.8 6.4    Microbiology Recent Results (from the past 240 hour(s))  SARS Coronavirus 2 by RT PCR (hospital order, performed in [facility] hospital lab) *cepheid single result test* Anterior Nasal Swab     Status: None   Collection Time: 07/17/22  3:13 PM   Specimen: Anterior Nasal Swab  Result Value Ref Range Status   SARS Coronavirus 2 by RT PCR NEGATIVE NEGATIVE Final    Comment: (NOTE) SARS-CoV-2 target nucleic acids are NOT DETECTED.  The SARS-CoV-2 RNA is generally detectable in upper and lower respiratory specimens during the acute phase of infection. The lowest concentration of SARS-CoV-2 viral copies this assay can detect is 250 copies / mL. A negative result does not preclude SARS-CoV-2 infection and should not be used as the sole basis for treatment or other patient management decisions.  A negative result may occur with improper specimen collection / handling, submission of specimen other than nasopharyngeal swab, presence of viral mutation(s) within the areas targeted by this assay, and inadequate number of viral copies (<250 copies / mL). A negative result must be combined with clinical observations, patient history, and epidemiological information.  Fact Sheet for Patients:   RoadLapTop.co.za  Fact Sheet for Healthcare Providers: http://kim-miller.com/  This test is not yet approved or  cleared by the [place] FDA and has been authorized for detection and/or diagnosis of SARS-CoV-2 by FDA under an Emergency Use Authorization (EUA).  This EUA will remain in effect (meaning this test can be used) for  the duration of the COVID-19 declaration under Section 564(b)(1) of the Act, 21 U.S.C. section 360bbb-3(b)(1), unless the authorization is terminated or revoked sooner.  Performed at [facility], [address], [place], [place] [address]   Culture, blood (Routine X 2) w Reflex to ID Panel     Status: Abnormal   Collection Time: 07/17/22  3:31 PM   Specimen: Left Antecubital; Blood  Result Value Ref Range Status   Specimen Description   Final    LEFT ANTECUBITAL BLOOD Performed at [facility], [address] [place], [place] [address]    Special Requests   Final    BOTTLES DRAWN AEROBIC AND ANAEROBIC Blood Culture adequate volume Performed at [facility], [address] [place], [place] [address]    Culture  Setup  Time   Final    GRAM POSITIVE COCCI IN CHAINS IN BOTH AEROBIC AND ANAEROBIC BOTTLES CRITICAL RESULT CALLED TO, READ BACK BY AND VERIFIED WITH: PHARMD A ELLINGTON [id number] AT 815 BY CM    Culture (A)  Final    GROUP A STREP (S.PYOGENES) ISOLATED HEALTH DEPARTMENT NOTIFIED Performed at [facility], [address] [place], [place] [address]    Report Status 07/21/2022 FINAL  Final   Organism ID, Bacteria GROUP A STREP (S.PYOGENES) ISOLATED  Final      Susceptibility   Group a strep (s.pyogenes) isolated - MIC*    PENICILLIN <=0.06 SENSITIVE Sensitive     CEFTRIAXONE <=0.12 SENSITIVE Sensitive     ERYTHROMYCIN <=0.12 SENSITIVE Sensitive     LEVOFLOXACIN 0.5 SENSITIVE Sensitive     VANCOMYCIN 0.25 SENSITIVE Sensitive     * GROUP A STREP (S.PYOGENES) ISOLATED  Blood Culture ID Panel (Reflexed)     Status: Abnormal   Collection Time: 07/17/22  3:31 PM  Result Value Ref Range Status   Enterococcus faecalis NOT DETECTED NOT DETECTED Final   Enterococcus Faecium NOT DETECTED NOT DETECTED Final   Listeria monocytogenes NOT DETECTED NOT DETECTED Final   Staphylococcus species NOT DETECTED NOT DETECTED Final    Staphylococcus aureus (BCID) NOT DETECTED NOT DETECTED Final   Staphylococcus epidermidis NOT DETECTED NOT DETECTED Final   Staphylococcus lugdunensis NOT DETECTED NOT DETECTED Final   Streptococcus species DETECTED (A) NOT DETECTED Final    Comment: CRITICAL RESULT CALLED TO, READ BACK BY AND VERIFIED WITH: PHARMD A ELLINGTON [id number] AT 815 AM BY CM    Streptococcus agalactiae NOT DETECTED NOT DETECTED Final   Streptococcus pneumoniae NOT DETECTED NOT DETECTED Final   Streptococcus pyogenes DETECTED (A) NOT DETECTED Final    Comment: CRITICAL RESULT CALLED TO, READ BACK BY AND VERIFIED WITH: PHARMD A ELLINGTON [id number] AT 815 AM BY CM    A.calcoaceticus-baumannii NOT DETECTED NOT DETECTED Final   Bacteroides fragilis NOT DETECTED NOT DETECTED Final   Enterobacterales NOT DETECTED NOT DETECTED Final   Enterobacter cloacae complex NOT DETECTED NOT DETECTED Final   Escherichia coli NOT DETECTED NOT DETECTED Final   Klebsiella aerogenes NOT DETECTED NOT DETECTED Final   Klebsiella oxytoca NOT DETECTED NOT DETECTED Final   Klebsiella pneumoniae NOT DETECTED NOT DETECTED Final   Proteus species NOT DETECTED NOT DETECTED Final   Salmonella species NOT DETECTED NOT DETECTED Final   Serratia marcescens NOT DETECTED NOT DETECTED Final   Haemophilus influenzae NOT DETECTED NOT DETECTED Final   Neisseria meningitidis NOT DETECTED NOT DETECTED Final   Pseudomonas aeruginosa NOT DETECTED NOT DETECTED Final   Stenotrophomonas maltophilia NOT DETECTED NOT DETECTED Final   Candida albicans NOT DETECTED NOT DETECTED Final   Candida auris NOT DETECTED NOT DETECTED Final   Candida glabrata NOT DETECTED NOT DETECTED Final   Candida krusei NOT DETECTED NOT DETECTED Final   Candida parapsilosis NOT DETECTED NOT DETECTED Final   Candida tropicalis NOT DETECTED NOT DETECTED Final   Cryptococcus neoformans/gattii NOT DETECTED NOT DETECTED Final    Comment: Performed at [facility], [address] [place], [place] [address]  Culture, blood (Routine X 2) w Reflex to ID Panel     Status: Abnormal   Collection Time: 07/17/22  3:43 PM   Specimen: Right Antecubital; Blood  Result Value Ref Range Status   Specimen Description   Final    RIGHT ANTECUBITAL BLOOD Performed at [facility], [address] [place], [place] [address]    Special Requests   Final    BOTTLES DRAWN AEROBIC AND ANAEROBIC Blood Culture adequate volume Performed at [facility], [address] [place], [place] [address]    Culture  Setup Time   Final    GRAM POSITIVE COCCI IN CHAINS IN BOTH AEROBIC AND ANAEROBIC BOTTLES CRITICAL VALUE NOTED.  VALUE IS CONSISTENT WITH PREVIOUSLY REPORTED AND CALLED VALUE.    Culture (A)  Final    GROUP A STREP (S.PYOGENES) ISOLATED SUSCEPTIBILITIES PERFORMED ON PREVIOUS CULTURE WITHIN THE LAST 5 DAYS. Performed at [facility], [address] [place] [address], [place], [place] [address]    Report Status 07/21/2022 FINAL  Final  Body fluid culture w Gram Stain     Status: None   Collection Time: 07/18/22  2:23 PM   Specimen: Synovium; Synovial Fluid  Result Value Ref Range Status   Specimen Description   Final    SYNOVIAL R KNEE Performed at [facility], [address] [place], [place] [address]    Special Requests   Final    NONE Performed at [facility], [address], [place], [place] [address]    Gram Stain   Final    MODERATE WBC PRESENT, PREDOMINANTLY PMN RARE GRAM POSITIVE COCCI IN CHAINS INTRACELLULAR CRITICAL RESULT CALLED TO, READ BACK BY AND VERIFIED WITH: RN REBECCA FEWELL ON 07/18/22 @ 2152 BY DRT    Culture   Final    RARE GROUP A STREP (S.PYOGENES) ISOLATED SUSCEPTIBILITIES PERFORMED ON PREVIOUS CULTURE WITHIN THE LAST 5 DAYS. Performed at [facility], [address], [place], [place] [address]    Report Status 07/22/2022 FINAL  Final  Anaerobic culture w Gram Stain     Status: None    Collection Time: 07/18/22  2:23 PM   Specimen: Synovium; Synovial Fluid  Result Value Ref Range Status   Specimen Description   Final    SYNOVIAL R KNEE Performed at [facility], [address], [place], [place] [address]    Special Requests   Final    NONE Performed at [facility], [address], [place], [place] [address]    Gram Stain   Final    NO ANAEROBES ISOLATED Performed at [facility], [address], [place], [place] [address]    Culture   Final    NO ANAEROBES ISOLATED; CULTURE IN PROGRESS FOR 5 DAYS   Report Status 07/23/2022 FINAL  Final  Culture, blood (Routine X 2) w Reflex to ID Panel     Status: None   Collection Time: 07/19/22  4:49 AM   Specimen: BLOOD LEFT HAND  Result Value Ref Range Status   Specimen Description   Final    BLOOD LEFT HAND Performed at [facility], [address] [place], [place] [address]    Special Requests   Final    BOTTLES DRAWN AEROBIC ONLY Blood Culture adequate volume Performed at [facility], [address] [place], [place] [address]    Culture   Final    NO GROWTH 5 DAYS Performed at [facility], [address] [place], [place] [address]    Report Status 07/24/2022 FINAL  Final  Culture, blood (Routine X 2) w Reflex to ID Panel     Status: None   Collection Time: 07/19/22  4:51 AM   Specimen: BLOOD RIGHT HAND  Result Value Ref  Range Status   Specimen Description   Final    BLOOD RIGHT HAND Performed at [facility], [address] [place], [place] [address]    Special Requests   Final    BOTTLES DRAWN AEROBIC AND ANAEROBIC Blood Culture adequate volume Performed at [facility], [address] [place], [place] [address]    Culture   Final    NO GROWTH 5 DAYS Performed at [facility], [address], [place], [place] [address]    Report Status 07/24/2022 FINAL  Final  Aerobic/Anaerobic Culture w Gram  Stain (surgical/deep wound)     Status: None   Collection Time: 07/20/22  9:41 AM   Specimen: Synovial, Right Knee; Body Fluid  Result Value Ref Range Status   Specimen Description TISSUE  Final   Special Requests RIGHT KNEE 1  Final   Gram Stain NO ORGANISMS SEEN NO WBC SEEN   Final   Culture   Final    No growth aerobically or anaerobically. Performed at [facility], [address], [place], [place] [address]    Report Status 07/26/2022 FINAL  Final  Aerobic/Anaerobic Culture w Gram Stain (surgical/deep wound)     Status: None   Collection Time: 07/20/22  9:42 AM   Specimen: Synovial, Right Knee; Body Fluid  Result Value Ref Range Status   Specimen Description TISSUE  Final   Special Requests RIGHT KNEE 2  Final   Gram Stain NO ORGANISMS SEEN NO WBC SEEN   Final   Culture   Final    No growth aerobically or anaerobically. Performed at [facility], [address] [place], [place] [address]    Report Status 07/26/2022 FINAL  Final  Aerobic/Anaerobic Culture w Gram Stain (surgical/deep wound)     Status: None   Collection Time: 07/20/22  9:42 AM   Specimen: Synovial, Right Knee; Body Fluid  Result Value Ref Range Status   Specimen Description TISSUE  Final   Special Requests RIGHT KNEE 3  Final   Gram Stain NO ORGANISMS SEEN NO WBC SEEN   Final   Culture   Final    RARE GROUP A STREP (S.PYOGENES) ISOLATED Beta hemolytic streptococci are predictably susceptible to penicillin and other beta lactams. Susceptibility testing not routinely performed. NO ANAEROBES ISOLATED Performed at [facility], [address] [place], [place] [address]    Report Status 07/26/2022 FINAL  Final     Medications:    amLODipine  5 mg Oral Daily   vitamin C  1,000 mg Oral Daily   Chlorhexidine Gluconate Cloth  6 each Topical Q0600   dorzolamide-timolol  1 drop Both Eyes BID   latanoprost  1 drop Both Eyes QHS   losartan  100 mg Oral QHS   multivitamin with minerals   1 tablet Oral Daily   pantoprazole  40 mg Oral Q0600   rivaroxaban  20 mg Oral Q lunch   sodium chloride flush  3 mL Intravenous Q12H   tamsulosin  0.4 mg Oral Daily   Continuous Infusions:  penicillin G potassium 12 Million Units in dextrose 5 % 500 mL CONTINUOUS infusion 12 Million Units (07/27/22 0056)      LOS: 9 days   Marinda Elk  [facility]  07/27/2022, 8:42 AM

## 2022-07-27 NOTE — Progress Notes (Signed)
Physical Therapy Treatment Patient Details Name: Caleb Barton MRN: [id number] DOB: 01/29/1941 Today's Date: 07/27/2022   History of Present Illness Caleb Barton is a 82 y.o. male presenting to the ED on 07/17/2022 with sudden onset lower extremity weakness and worsening low back pain.  He did fall to ground where he stayed for 3 hours until spouse could contact son who arrived and called EMS.  Pt admitted 07/17/22 for Sepsis due to group A Streptococcus without acute organ dysfunction.  Pt s/p Right knee 1 component revision poly exchange with irrigation and debridement  by orthopedics on 07/20/22.   Pt with medical history significant of paroxysmal atrial fibrillation/LAFB/RBBB, hypertension, hyperlipidemia, OSA on CPAP, remote history of thalamic CVA, BPH, history of prostate cancer status post radiation treatment approximately a year ago being followed by [facility], Dr. Annabell Howells, ablation of the nerves done in his lower back by neurosurgery, Dr.Eichman approximately 2 weeks prior to this adission.    PT Comments    Pt OOB sitting in recliner with family visiting. Worked on sit to stands using STEDY. Pt was able to stand x 2 for ~30 seconds each time. He is generally weak in addition to the R knee pain. Pain rated 5/10 during session on today. Performed a few ROM exercises in the chair. Family reports they have been working on APs, quad sets, and heel slides for R LE when in bed. Encouraged pt to sit as tolerated. Pt will benefit from post acute rehab after hospital stay.    Recommendations for follow up therapy are one component of a multi-disciplinary discharge planning process, led by the attending physician.  Recommendations may be updated based on patient status, additional functional criteria and insurance authorization.  Follow Up Recommendations  Can patient physically be transported by private vehicle: No    Assistance Recommended at Discharge    Patient can return home with the  following Two people to help with walking and/or transfers;A lot of help with bathing/dressing/bathroom;Assist for transportation;Help with stairs or ramp for entrance;Assistance with cooking/housework   Equipment Recommendations  None recommended by PT    Recommendations for Other Services       Precautions / Restrictions Precautions Precautions: Fall Precaution Comments: R knee wound vac Restrictions Weight Bearing Restrictions: No RLE Weight Bearing: Weight bearing as tolerated LLE Weight Bearing: Weight bearing as tolerated     Mobility  Bed Mobility               General bed mobility comments: oob in recliner    Transfers Overall transfer level: Needs assistance   Transfers: Sit to/from Stand Sit to Stand: Max assist, +2 physical assistance, +2 safety/equipment           General transfer comment: Max Assist +2 to rise from recliner using STEDY. Utilized pad in chair to help raise hips. Pt stood for ~30 seconds each time. Once standing, Min guard A for static standing    Ambulation/Gait                   Stairs             Wheelchair Mobility    Modified Rankin (Stroke Patients Only)       Balance Overall balance assessment: Needs assistance         Standing balance support: Bilateral upper extremity supported, During functional activity, Reliant on assistive device for balance Standing balance-Leahy Scale: Poor  Cognition Arousal/Alertness: Awake/alert Behavior During Therapy: WFL for tasks assessed/performed Overall Cognitive Status: Within Functional Limits for tasks assessed                                          Exercises General Exercises - Lower Extremity Long Arc Quad: AROM, Left, 15 reps, Seated (R knee, 5 reps, limited range-unable to fully extend against gravity) Hip ABduction/ADduction: AAROM, Right, 10 reps, Seated Hip Flexion/Marching: AROM, Left, 10  reps, Seated    General Comments        Pertinent Vitals/Pain Pain Assessment Pain Assessment: 0-10 Pain Score: 5  Pain Location: right knee Pain Descriptors / Indicators: Operative site guarding, Discomfort Pain Intervention(s): Limited activity within patient's tolerance, Monitored during session, Repositioned    Home Living                          Prior Function            PT Goals (current goals can now be found in the care plan section) Progress towards PT goals: Progressing toward goals    Frequency    Min 3X/week      PT Plan Current plan remains appropriate    Co-evaluation              AM-PAC PT "6 Clicks" Mobility   Outcome Measure  Help needed turning from your back to your side while in a flat bed without using bedrails?: A Lot Help needed moving from lying on your back to sitting on the side of a flat bed without using bedrails?: A Lot Help needed moving to and from a bed to a chair (including a wheelchair)?: Total Help needed standing up from a chair using your arms (e.g., wheelchair or bedside chair)?: Total Help needed to walk in hospital room?: Total Help needed climbing 3-5 steps with a railing? : Total 6 Click Score: 8    End of Session   Activity Tolerance: Patient limited by fatigue;Patient tolerated treatment well Patient left: in chair;with call bell/phone within reach;with family/visitor present   PT Visit Diagnosis: Difficulty in walking, not elsewhere classified (R26.2);Pain;Muscle weakness (generalized) (M62.81) Pain - Right/Left: Right Pain - part of body: Knee     Time: 5732-2025 PT Time Calculation (min) (ACUTE ONLY): 18 min  Charges:  $Therapeutic Activity: 8-22 mins              Faye Ramsay, PT Acute Rehabilitation  Office: [phone]

## 2022-07-28 DIAGNOSIS — E559 Vitamin D deficiency, unspecified: Secondary | ICD-10-CM | POA: Diagnosis not present

## 2022-07-28 DIAGNOSIS — A400 Sepsis due to streptococcus, group A: Secondary | ICD-10-CM | POA: Diagnosis not present

## 2022-07-28 DIAGNOSIS — R69 Illness, unspecified: Secondary | ICD-10-CM | POA: Diagnosis not present

## 2022-07-28 DIAGNOSIS — Z7401 Bed confinement status: Secondary | ICD-10-CM | POA: Diagnosis not present

## 2022-07-28 DIAGNOSIS — R609 Edema, unspecified: Secondary | ICD-10-CM | POA: Diagnosis not present

## 2022-07-28 DIAGNOSIS — M545 Low back pain, unspecified: Secondary | ICD-10-CM | POA: Diagnosis not present

## 2022-07-28 DIAGNOSIS — E119 Type 2 diabetes mellitus without complications: Secondary | ICD-10-CM | POA: Diagnosis not present

## 2022-07-28 DIAGNOSIS — Z743 Need for continuous supervision: Secondary | ICD-10-CM | POA: Diagnosis not present

## 2022-07-28 DIAGNOSIS — R29898 Other symptoms and signs involving the musculoskeletal system: Secondary | ICD-10-CM | POA: Diagnosis not present

## 2022-07-28 DIAGNOSIS — B181 Chronic viral hepatitis B without delta-agent: Secondary | ICD-10-CM | POA: Diagnosis not present

## 2022-07-28 DIAGNOSIS — F4321 Adjustment disorder with depressed mood: Secondary | ICD-10-CM | POA: Diagnosis not present

## 2022-07-28 DIAGNOSIS — M25561 Pain in right knee: Secondary | ICD-10-CM | POA: Diagnosis not present

## 2022-07-28 DIAGNOSIS — T8450XA Infection and inflammatory reaction due to unspecified internal joint prosthesis, initial encounter: Secondary | ICD-10-CM | POA: Diagnosis not present

## 2022-07-28 DIAGNOSIS — R531 Weakness: Secondary | ICD-10-CM | POA: Diagnosis not present

## 2022-07-28 DIAGNOSIS — I480 Paroxysmal atrial fibrillation: Secondary | ICD-10-CM | POA: Diagnosis not present

## 2022-07-28 DIAGNOSIS — R41841 Cognitive communication deficit: Secondary | ICD-10-CM | POA: Diagnosis not present

## 2022-07-28 DIAGNOSIS — M6281 Muscle weakness (generalized): Secondary | ICD-10-CM | POA: Diagnosis not present

## 2022-07-28 DIAGNOSIS — E785 Hyperlipidemia, unspecified: Secondary | ICD-10-CM | POA: Diagnosis not present

## 2022-07-28 DIAGNOSIS — I10 Essential (primary) hypertension: Secondary | ICD-10-CM | POA: Diagnosis not present

## 2022-07-28 DIAGNOSIS — C61 Malignant neoplasm of prostate: Secondary | ICD-10-CM | POA: Diagnosis not present

## 2022-07-28 DIAGNOSIS — R7881 Bacteremia: Secondary | ICD-10-CM | POA: Diagnosis not present

## 2022-07-28 DIAGNOSIS — B955 Unspecified streptococcus as the cause of diseases classified elsewhere: Secondary | ICD-10-CM | POA: Diagnosis not present

## 2022-07-28 DIAGNOSIS — N400 Enlarged prostate without lower urinary tract symptoms: Secondary | ICD-10-CM | POA: Diagnosis not present

## 2022-07-28 DIAGNOSIS — E7800 Pure hypercholesterolemia, unspecified: Secondary | ICD-10-CM | POA: Diagnosis not present

## 2022-07-28 DIAGNOSIS — G4733 Obstructive sleep apnea (adult) (pediatric): Secondary | ICD-10-CM | POA: Diagnosis not present

## 2022-07-28 DIAGNOSIS — D649 Anemia, unspecified: Secondary | ICD-10-CM | POA: Diagnosis not present

## 2022-07-28 DIAGNOSIS — T8453XD Infection and inflammatory reaction due to internal right knee prosthesis, subsequent encounter: Secondary | ICD-10-CM | POA: Diagnosis not present

## 2022-07-28 DIAGNOSIS — R2681 Unsteadiness on feet: Secondary | ICD-10-CM | POA: Diagnosis not present

## 2022-07-28 DIAGNOSIS — T8450XD Infection and inflammatory reaction due to unspecified internal joint prosthesis, subsequent encounter: Secondary | ICD-10-CM | POA: Diagnosis not present

## 2022-07-28 MED ORDER — OXYCODONE HCL 5 MG PO TABS: 5.0000 mg | ORAL_TABLET | ORAL | 0 refills | Status: DC | PRN

## 2022-07-28 MED ORDER — PENICILLIN G POTASSIUM IV (FOR PTA / DISCHARGE USE ONLY)
24.0000 10*6.[IU] | INTRAVENOUS | 0 refills | Status: DC
Start: 2022-07-28 — End: 2022-08-29

## 2022-07-28 MED ORDER — HEPARIN SOD (PORK) LOCK FLUSH 100 UNIT/ML IV SOLN
250.0000 [IU] | INTRAVENOUS | Status: AC | PRN
  Administered 2022-07-28: 250 [IU]

## 2022-07-28 MED ORDER — POLYETHYLENE GLYCOL 3350 17 G PO PACK: 17.0000 g | PACK | Freq: Every day | ORAL | 0 refills | Status: DC | PRN

## 2022-07-28 NOTE — Care Management Important Message (Signed)
Important Message  Patient Details IM Letter placed in Patient's room. Name: Caleb Barton MRN: [id number] Date of Birth: 1941-02-06   Medicare Important Message Given:  Yes     Caren Macadam 07/28/2022, 12:05 PM

## 2022-07-28 NOTE — Progress Notes (Signed)
[facility] PROGRESS NOTE    Progress Note  Caleb Barton  XQJ:[id number] DOB: 03-27-1941 DOA: 07/17/2022 PCP: Shon Hale, MD     Brief Narrative:   Caleb Barton is an 82 y.o. male past medical history of paroxysmal atrial fibrillation on anticoagulation, history of CVA, prostate cancer status post radiation treatment comes in with sudden onset of lower extremity weakness and worsening back pain.  He status post ablation 2 weeks ago by neurosurgery for back pain, he reports sudden onset of lower extremity weakness approximately 3 hours prior to admission.  MRI of the lumbar spine reviewed by neurosurgery who feels he does not need any surgical intervention at this time and feels patient's weakness is not due to the lumbar spine, he was noted to spike a fever in the ED chest x-ray concerning for bilateral infiltrates, blood cultures grew strep pyogenes, he was placed empirically on antibiotics, knee x-ray showed an effusion ID and orthopedic surgery was consulted as there was a concern of seeding of the prosthetic joint underwent aspiration which was positive for Streptococcus group A.  Surveillance blood cultures have been negative till date.  Assessment/Plan:   Sepsis due to group A Streptococcus without acute organ dysfunction PICC line was placed on 07/22/2022. Scheduled for ID follow-up on 08/12/2022. PT evaluated the patient currently awaiting authorization approval.  Right prosthetic knee infection: Cultures and sensitivities are back, ID recommended IV penicillins for 6 weeks. Wound VAC removed Follow-up with orthopedic and ID as an outpatient. PT evaluated the patient, will need skilled nursing facility placement. Orthopedic surgery recommended Xarelto 10 mg postop. They also recommended to follow-up with them in 2 weeks as an outpatient.  Questionable pneumonia: Has completed course of antibiotics in house.  Mild rhabdomyolysis: Probably secondary to fall  resolved with IV fluids.  Transaminitis: Acute hepatitis panel B was reactive will need to follow-up with ID as an outpatient.  Paroxysmal atrial fibrillation: Rate controlled continue Xarelto.  Bilateral lower extremity weakness/low back pain present on admission: Probably secondary to sepsis. The case was discussed with Dr. Franky Macho who graciously agreed to see the patient and reviewed the film recommended no further intervention. Statins were held on admission. PT OT evaluated the patient awaiting skilled nursing facility placement.  BPH: Continue Flomax.  Hyperlipidemia: Holding statin due to rhabdomyolysis and lower extremity weakness can be resumed as an outpatient by PCP.  Dehydration: Appears euvolemic continue to hold hydrochlorothiazide.  Essential hypertension: Blood pressure has been slowly trending up.  He received several days of IV Lasix now is soft.. Blood pressure continues to be significantly elevated increase losartan and Norvasc.   DVT prophylaxis: lovenox Family Communication:none Status is: Inpatient Remains inpatient appropriate because: Strep infection will need skilled nursing facility placement.    Code Status:     Code Status Orders  (From admission, onward)           Start     Ordered   07/17/22 1805  Do not attempt resuscitation (DNR)  Continuous       Question Answer Comment  If patient has no pulse and is not breathing Do Not Attempt Resuscitation   If patient has a pulse and/or is breathing: Medical Treatment Goals LIMITED ADDITIONAL INTERVENTIONS: Use medication/IV fluids and cardiac monitoring as indicated; Do not use intubation or mechanical ventilation (DNI), also provide comfort medications.  Transfer to Progressive/Stepdown as indicated, avoid Intensive Care.   Consent: Discussion documented in EHR or advanced directives reviewed  07/17/22 1814           Code Status History     Date Active Date Inactive Code Status  Order ID Comments User Context   07/01/2021 1417 07/02/2021 1646 Full Code [id number]  Joen LauraMarchwiany, Daniel A, MD Inpatient   07/29/2019 1211 07/29/2019 2032 Full Code [id number]  Iran OuchArida, Muhammad A, MD Inpatient   02/09/2012 1136 02/10/2012 1714 Full Code [id number]  Lavina Hammanollins, Willie Lowell Jr., RN Inpatient      Advance Directive Documentation    Flowsheet Row Most Recent Value  Type of Advance Directive Living will  Pre-existing out of facility DNR order (yellow form or pink MOST form) --  "MOST" Form in Place? --         IV Access:   Peripheral IV   Procedures and diagnostic studies:   No results found.   Medical Consultants:   None.   Subjective:    Caleb Barton no complaints had a bowel movement.  Objective:    Vitals:   07/27/22 1250 07/27/22 2109 07/27/22 2133 07/28/22 0206  BP: 131/71 93/79 (!) 145/77 (!) 143/76  Pulse: 65 94 72 71  Resp: 20 20  20   Temp: 98.2 F (36.8 C) 97.6 F (36.4 C)  98.7 F (37.1 C)  TempSrc: Oral Oral  Oral  SpO2: 93% 94%  97%  Weight:      Height:       SpO2: 97 % O2 Flow Rate (L/min): 3 L/min FiO2 (%): 21 %   Intake/Output Summary (Last 24 hours) at 07/28/2022 0809 Last data filed at 07/28/2022 0500 Gross per 24 hour  Intake 1273.93 ml  Output 2600 ml  Net -1326.07 ml    Filed Weights   07/24/22 0500 07/25/22 0704 07/26/22 0541  Weight: 110.2 kg 118.9 kg 120.2 kg    Exam: General exam: In no acute distress. Respiratory system: Good air movement and clear to auscultation. Cardiovascular system: S1 & S2 heard, RRR. No JVD. Gastrointestinal system: Abdomen is nondistended, soft and nontender.  Extremities: No pedal edema. Skin: No rashes, lesions or ulcers Psychiatry: Judgement and insight appear normal. Mood & affect appropriate. Data Reviewed:    Labs: Basic Metabolic Panel: Recent Labs  Lab 07/22/22 0435 07/23/22 0429  NA 137 137  K 3.6 3.5  CL 97* 97*  CO2 33* 32  GLUCOSE 133* 118*  BUN 23 17   CREATININE 0.80 0.66  CALCIUM 8.3* 8.1*  MG 2.5*  --     GFR Estimated Creatinine Clearance: 95.8 mL/min (by C-G formula based on SCr of 0.66 mg/dL). Liver Function Tests: Recent Labs  Lab 07/22/22 0435 07/23/22 0429  AST 73* 61*  ALT 97* 101*  ALKPHOS 107 125  BILITOT 1.3* 1.6*  PROT 5.6* 5.9*  ALBUMIN 2.6* 2.5*    No results for input(s): "LIPASE", "AMYLASE" in the last 168 hours. No results for input(s): "AMMONIA" in the last 168 hours. Coagulation profile No results for input(s): "INR", "PROTIME" in the last 168 hours.  COVID-19 Labs  No results for input(s): "DDIMER", "FERRITIN", "LDH", "CRP" in the last 72 hours.  Lab Results  Component Value Date   SARSCOV2NAA NEGATIVE 07/17/2022   SARSCOV2NAA NEGATIVE 06/27/2021   SARSCOV2NAA NEGATIVE 07/27/2019    CBC: Recent Labs  Lab 07/22/22 0435 07/23/22 0429  WBC 7.8 6.4  NEUTROABS 6.0 4.3  HGB 12.0* 12.5*  HCT 36.3* 37.7*  MCV 98.4 97.2  PLT 160 217    Cardiac Enzymes: No results for input(s): "CKTOTAL", "CKMB", "CKMBINDEX", "  TROPONINI" in the last 168 hours.  BNP (last 3 results) No results for input(s): "PROBNP" in the last 8760 hours. CBG: Recent Labs  Lab 07/24/22 1001 07/25/22 0744 07/26/22 0742 07/27/22 0744  GLUCAP 160* 116* 102* 103*    D-Dimer: No results for input(s): "DDIMER" in the last 72 hours. Hgb A1c: No results for input(s): "HGBA1C" in the last 72 hours. Lipid Profile: No results for input(s): "CHOL", "HDL", "LDLCALC", "TRIG", "CHOLHDL", "LDLDIRECT" in the last 72 hours. Thyroid function studies: No results for input(s): "TSH", "T4TOTAL", "T3FREE", "THYROIDAB" in the last 72 hours.  Invalid input(s): "FREET3" Anemia work up: No results for input(s): "VITAMINB12", "FOLATE", "FERRITIN", "TIBC", "IRON", "RETICCTPCT" in the last 72 hours. Sepsis Labs: Recent Labs  Lab 07/22/22 0435 07/23/22 0429  WBC 7.8 6.4    Microbiology Recent Results (from the past 240 hour(s))   Body fluid culture w Gram Stain     Status: None   Collection Time: 07/18/22  2:23 PM   Specimen: Synovium; Synovial Fluid  Result Value Ref Range Status   Specimen Description   Final    SYNOVIAL R KNEE Performed at [facility], [address], [place], [place] [address]    Special Requests   Final    NONE Performed at [facility], [address] [place], [place] [address]    Gram Stain   Final    MODERATE WBC PRESENT, PREDOMINANTLY PMN RARE GRAM POSITIVE COCCI IN CHAINS INTRACELLULAR CRITICAL RESULT CALLED TO, READ BACK BY AND VERIFIED WITH: RN REBECCA FEWELL ON 07/18/22 @ 2152 BY DRT    Culture   Final    RARE GROUP A STREP (S.PYOGENES) ISOLATED SUSCEPTIBILITIES PERFORMED ON PREVIOUS CULTURE WITHIN THE LAST 5 DAYS. Performed at [facility], [address], [place], [place] [address]    Report Status 07/22/2022 FINAL  Final  Anaerobic culture w Gram Stain     Status: None   Collection Time: 07/18/22  2:23 PM   Specimen: Synovium; Synovial Fluid  Result Value Ref Range Status   Specimen Description   Final    SYNOVIAL R KNEE Performed at [facility], [address] [place], [place] [address]    Special Requests   Final    NONE Performed at [facility], [address] [place], [place] [address]    Gram Stain   Final    NO ANAEROBES ISOLATED Performed at [facility], [address] [place], [place] [address]    Culture   Final    NO ANAEROBES ISOLATED; CULTURE IN PROGRESS FOR 5 DAYS   Report Status 07/23/2022 FINAL  Final  Culture, blood (Routine X 2) w Reflex to ID Panel     Status: None   Collection Time: 07/19/22  4:49 AM   Specimen: BLOOD LEFT HAND  Result Value Ref Range Status   Specimen Description   Final    BLOOD LEFT HAND Performed at [facility], [address], [place], [place] [address]    Special Requests   Final    BOTTLES DRAWN AEROBIC ONLY Blood  Culture adequate volume Performed at [facility], [address], [place], [place] [address]    Culture   Final    NO GROWTH 5 DAYS Performed at [facility], [address], [place], [place] [address]    Report Status 07/24/2022 FINAL  Final  Culture, blood (Routine X 2) w Reflex to ID  Panel     Status: None   Collection Time: 07/19/22  4:51 AM   Specimen: BLOOD RIGHT HAND  Result Value Ref Range Status   Specimen Description   Final    BLOOD RIGHT HAND Performed at [facility], [address] [place], [place] [address]    Special Requests   Final    BOTTLES DRAWN AEROBIC AND ANAEROBIC Blood Culture adequate volume Performed at [facility], [address], [place], [place] [address]    Culture   Final    NO GROWTH 5 DAYS Performed at [facility], [address] [place], [place] [address]    Report Status 07/24/2022 FINAL  Final  Aerobic/Anaerobic Culture w Gram Stain (surgical/deep wound)     Status: None   Collection Time: 07/20/22  9:41 AM   Specimen: Synovial, Right Knee; Body Fluid  Result Value Ref Range Status   Specimen Description TISSUE  Final   Special Requests RIGHT KNEE 1  Final   Gram Stain NO ORGANISMS SEEN NO WBC SEEN   Final   Culture   Final    No growth aerobically or anaerobically. Performed at [facility], [address] [place], [place] [address]    Report Status 07/26/2022 FINAL  Final  Aerobic/Anaerobic Culture w Gram Stain (surgical/deep wound)     Status: None   Collection Time: 07/20/22  9:42 AM   Specimen: Synovial, Right Knee; Body Fluid  Result Value Ref Range Status   Specimen Description TISSUE  Final   Special Requests RIGHT KNEE 2  Final   Gram Stain NO ORGANISMS SEEN NO WBC SEEN   Final   Culture   Final    No growth aerobically or anaerobically. Performed at [facility], [address], [place], [place] [address]    Report Status 07/26/2022 FINAL  Final   Aerobic/Anaerobic Culture w Gram Stain (surgical/deep wound)     Status: None   Collection Time: 07/20/22  9:42 AM   Specimen: Synovial, Right Knee; Body Fluid  Result Value Ref Range Status   Specimen Description TISSUE  Final   Special Requests RIGHT KNEE 3  Final   Gram Stain NO ORGANISMS SEEN NO WBC SEEN   Final   Culture   Final    RARE GROUP A STREP (S.PYOGENES) ISOLATED Beta hemolytic streptococci are predictably susceptible to penicillin and other beta lactams. Susceptibility testing not routinely performed. NO ANAEROBES ISOLATED Performed at [facility], [address], [place], [place] [address]    Report Status 07/26/2022 FINAL  Final     Medications:    amLODipine  5 mg Oral Daily   vitamin C  1,000 mg Oral Daily   Chlorhexidine Gluconate Cloth  6 each Topical Q0600   dorzolamide-timolol  1 drop Both Eyes BID   latanoprost  1 drop Both Eyes QHS   losartan  100 mg Oral QHS   multivitamin with minerals  1 tablet Oral Daily   pantoprazole  40 mg Oral Q0600   rivaroxaban  20 mg Oral Q lunch   sodium chloride flush  3 mL Intravenous Q12H   tamsulosin  0.4 mg Oral Daily   Continuous Infusions:  penicillin G potassium 12 Million Units in dextrose 5 % 500 mL CONTINUOUS infusion 12 Million Units (07/28/22 0257)      LOS: 10 days   Marinda Elk  [facility]  07/28/2022, 8:09 AM

## 2022-07-28 NOTE — Discharge Summary (Addendum)
Physician Discharge Summary  Caleb Barton JXB:[id number] DOB: [birth year] DOA: 07/17/2022  PCP: Shon Hale, MD  Admit date: 07/17/2022 Discharge date: 07/28/2022  Admitted From: Home Disposition:  SNF  Recommendations for Outpatient Follow-up:  Follow up with orthopedic surgery in 1-2 weeks Please obtain BMP/CBC in one week Follow-up with ID for IV antibiotics and also follow-up on hepatitis B positive serology.  Home Health:No Equipment/Devices:None  Discharge Condition:Stable CODE STATUS:Full Diet recommendation: Heart Healthy   Brief/Interim Summary: 82 y.o. male past medical history of paroxysmal atrial fibrillation on anticoagulation, history of CVA, prostate cancer status post radiation treatment comes in with sudden onset of lower extremity weakness and worsening back pain.  He status post ablation 2 weeks ago by neurosurgery for back pain, he reports sudden onset of lower extremity weakness approximately 3 hours prior to admission.  MRI of the lumbar spine reviewed by neurosurgery who feels he does not need any surgical intervention at this time and feels patient's weakness is not due to the lumbar spine, he was noted to spike a fever in the ED chest x-ray concerning for bilateral infiltrates, blood cultures grew strep pyogenes, he was placed empirically on antibiotics, knee x-ray showed an effusion ID and orthopedic surgery was consulted as there was a concern of seeding of the prosthetic joint underwent aspiration which was positive for Streptococcus group A.  Surveillance blood cultures have been negative till date.   Discharge Diagnoses:  Principal Problem:   Sepsis due to group A Streptococcus without acute organ dysfunction Active Problems:   Streptococcal bacteremia   HTN (hypertension)   Stroke   Sleep apnea   Right knee DJD   Hypercholesteremia   Malignant neoplasm of prostate   Pneumonia   Lower extremity weakness   Lower back pain   Paroxysmal A-fib    Knee pain, right   Prosthetic joint infection Sepsis due to group a streptococcus without acute organ dysfunction: Blood cultures were ordered which were positive for strep pyogenes of unclear source. ID was consulted. 2D echo showed no vegetation surveillance blood cultures remain negative till date. ID was consulted recommended change antibiotics to IV penicillins after sensitivities came back. With a start date on 07/21/2022 once antibiotics are completed IV the patient will probably need to go on suppressive therapy. PICC line was placed on 07/22/2022. He is scheduled for follow-up appointment with ID on 08/12/2022. Physical therapy evaluated the patient, will need to go to skilled nursing facility.  Right prosthetic knee infection: Orthopedic surgery was consulted and aspirate grew Streptococcus group a, orthopedic surgery recommended washout of needed which was done on 07/20/2022. Cultures and sensitivities came back he was transition to IV penicillin G for 6 weeks. Wound VAC was placed which was eventually removed before discharge. Physical therapy evaluated the patient, will need to go to skilled. Orthopedics to be recommended Xarelto for postop DVT prophylaxis they also recommended to follow-up with them in 2 weeks.  Questionable pneumonia: He completed his course of antibiotics in house.  Mild rhabdomyolysis: Resolved with IV fluid resuscitation.  Elevated LFTs: Acute hepatitis panel was reactive will need to follow-up with ID as an outpatient.  Paroxysmal atrial fibrillation: Continue Xarelto no changes made.  Bilateral lower extremity weakness/low back pain present on admission: MRI of the L-spine done that showed no acute findings neurosurgery was curb sided by the ED who recommended no surgical intervention close PT OT.  BPH: Continue Flomax.  Hyperlipidemia: Statin statins were held inpatient due to rhabdomyolysis they will be  resumed as an outpatient.  Essential  hypertension: No change made to his medication.   Discharge Instructions  Discharge Instructions     [facility] pharmacist to adjust dose for Vancomycin, Aminoglycosides and other anti-infective therapies as requested by physician.   Complete by: As directed    [facility] to provide Cath Flo 2mg    Complete by: As directed    Administer for PICC line occlusion and as ordered by physician for other access device issues.   Anaphylaxis Kit: Provided to treat any anaphylactic reaction to the medication being provided to the patient if First Dose or when requested by physician   Complete by: As directed    Epinephrine 1mg /ml vial / amp: Administer 0.3mg  (0.26ml) subcutaneously once for moderate to severe anaphylaxis, nurse to call physician and pharmacy when reaction occurs and call 911 if needed for immediate care   Diphenhydramine 50mg /ml IV vial: Administer 25-50mg  IV/IM PRN for first dose reaction, rash, itching, mild reaction, nurse to call physician and pharmacy when reaction occurs   Sodium Chloride 0.9% NS IV: Administer if needed for hypovolemic blood pressure drop or as ordered by physician after call to physician with anaphylactic reaction   Change dressing on IV access line weekly and PRN   Complete by: As directed    Diet - low sodium heart healthy   Complete by: As directed    Flush IV access with Sodium Chloride 0.9% and Heparin 10 units/ml or 100 units/ml   Complete by: As directed    Home infusion instructions - [facility]   Complete by: As directed    Instructions: Flush IV access with Sodium Chloride 0.9% and Heparin 10units/ml or 100units/ml   Change dressing on IV access line: Weekly and PRN   Instructions Cath Flo 2mg : Administer for PICC Line occlusion and as ordered by physician for other access device   [facility] pharmacist to adjust dose for: Vancomycin, Aminoglycosides and other anti-infective therapies as  requested by physician   Increase activity slowly   Complete by: As directed    Method of administration may be changed at the discretion of home infusion pharmacist based upon assessment of the patient and/or caregiver's ability to self-administer the medication ordered   Complete by: As directed    No wound care   Complete by: As directed       Allergies as of 07/28/2022       Reactions   Lisinopril Cough   Sulfa Antibiotics Rash        Medication List     STOP taking these medications    amoxicillin 500 MG capsule Commonly known as: AMOXIL   Potassium 99 MG Tabs       TAKE these medications    atorvastatin 40 MG tablet Commonly known as: LIPITOR Take 40 mg by mouth at bedtime.   Centrum Silver 50+Men Tabs Take 1 tablet by mouth at bedtime.   dorzolamide-timolol 2-0.5 % ophthalmic solution Commonly known as: COSOPT Place 1 drop into both eyes 2 (two) times daily.   hydrochlorothiazide 25 MG tablet Commonly known as: HYDRODIURIL Take 37.5 mg by mouth at bedtime.   latanoprost 0.005 % ophthalmic solution Commonly known as: XALATAN Place 1 drop into both eyes at bedtime.   losartan 50 MG tablet Commonly known as: COZAAR TAKE ONE AND ONE-HALF TABLET BY MOUTH DAILY What changed:  how much to take when to take this   methocarbamol 500 MG tablet Commonly known as: ROBAXIN  Take 500 mg by mouth See admin instructions. Take 500 mg by mouth every 4-6 hours as needed for muscle spasms   oxyCODONE 5 MG immediate release tablet Commonly known as: Oxy IR/ROXICODONE Take 1 tablet (5 mg total) by mouth every 4 (four) hours as needed for moderate pain.   penicillin G  IVPB Inject 24 Million Units into the vein daily. Administer as continuous infusion Indication:  Group A strep bacteremia/R knee PJI First Dose: Yes Last Day of Therapy:  08/31/22 Labs - Once weekly:  CBC/D and BMP, Labs - Every other week:  ESR and CRP Method of administration: Elastomeric  (Continuous infusion) Method of administration may be changed at the discretion of home infusion pharmacist based upon assessment of the patient and/or caregiver's ability to self-administer the medication ordered.   polyethylene glycol 17 g packet Commonly known as: MIRALAX / GLYCOLAX Take 17 g by mouth daily as needed for mild constipation.   rivaroxaban 20 MG Tabs tablet Commonly known as: XARELTO Take 1 tablet (20 mg total) by mouth daily with supper. What changed: when to take this   tamsulosin 0.4 MG Caps capsule Commonly known as: FLOMAX Take 0.4 mg by mouth at bedtime.   TYLENOL 500 MG tablet Generic drug: acetaminophen Take 500-1,000 mg by mouth every 6 (six) hours as needed for mild pain or headache.   vitamin C 1000 MG tablet Take 1,000 mg by mouth daily.               Discharge Care Instructions  (From admission, onward)           Start     Ordered   07/28/22 0000  Change dressing on IV access line weekly and PRN  (Home infusion instructions - [facility] )        07/28/22 1002            Contact information for follow-up providers     Joen Laura, MD Follow up in 2 week(s).   Specialty: Orthopedic Surgery Contact information: [address] [place] [address] [phone]              Contact information for after-discharge care     Destination     [facility] .   Service: Skilled Nursing Contact information: [address] [place] [address] [phone]                    Allergies  Allergen Reactions   Lisinopril Cough   Sulfa Antibiotics Rash    Consultations: Orthopedic surgery Infectious disease   Procedures/Studies: DG CHEST PORT 1 VIEW  Result Date: 07/22/2022 CLINICAL DATA:  PICC line placement EXAM: PORTABLE CHEST 1 VIEW COMPARISON:  Portable exam 1207 hours compared to 07/17/2022 FINDINGS: RIGHT arm PICC line tip projects over SVC. Upper  normal size of cardiac silhouette. Mediastinal contours and pulmonary vascularity normal. Atherosclerotic calcification aorta. Minimal bibasilar atelectasis. Lungs otherwise clear. No pleural effusion or pneumothorax. IMPRESSION: Tip of RIGHT arm PICC line projects over SVC. Bibasilar atelectasis. 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 Electronically Signed   By: Ulyses Southward M.D.   On: 07/22/2022 12:38   [place] EKG SITE RITE  Result Date: 07/21/2022 If Site Rite image not attached, placement could not be confirmed due to current cardiac rhythm.  DG Knee Right Port  Result Date: 07/20/2022 CLINICAL DATA:  Postop. EXAM: PORTABLE RIGHT KNEE - 1-2 VIEW COMPARISON:  07/18/2022 FINDINGS: Right knee arthroplasty in expected alignment.  No periprosthetic lucency or fracture. Prior patellar resurfacing. Postsurgical air in the subcutaneous tissues. Diminished knee joint effusion with air in the joint space. Wound VAC overlies the anterior knee. IMPRESSION: Diminished knee joint effusion postop.  Total arthroplasty in place. Electronically Signed   By: Narda Rutherford M.D.   On: 07/20/2022 12:11   ECHOCARDIOGRAM COMPLETE  Result Date: 07/18/2022    ECHOCARDIOGRAM REPORT   Patient Name:   Caleb Barton Date of Exam: 07/18/2022 Medical Rec #:  [id number]      Height:       69.0 in Accession #:    [id number]     Weight:       242.0 lb Date of Birth:  1940-04-26       BSA:          2.240 m Patient Age:    81 years       BP:           107/69 mmHg Patient Gender: M              HR:           103 bpm. Exam Location:  Inpatient Procedure: 2D Echo, Color Doppler and Cardiac Doppler Indications:    Bacteremia  History:        Patient has prior history of Echocardiogram examinations, most                 recent 06/30/2019. Stroke, Arrythmias:RBBB,                 Signs/Symptoms:Dyspnea; Risk Factors:Sleep Apnea and                 Hypertension. Cancer.  Sonographer:    Milda Smart Referring Phys: [id number] DANI[address] V THOMPSON   Sonographer Comments: Technically difficult study due to poor echo windows. Image acquisition challenging due to patient body habitus and Image acquisition challenging due to respiratory motion. Patient unable to be positioned for optimal imaging. IMPRESSIONS  1. Left ventricular ejection fraction, by estimation, is 60 to 65%. The left ventricle has normal function. The left ventricle has no regional wall motion abnormalities. Left ventricular diastolic parameters are indeterminate.  2. Right ventricular systolic function is normal. The right ventricular size is normal. Tricuspid regurgitation signal is inadequate for assessing PA pressure.  3. No evidence of mitral valve regurgitation.  4. The aortic valve was not well visualized. Aortic valve regurgitation is not visualized.  5. Aneurysm of the aortic root, measuring 42 mm.  6. The inferior vena cava is dilated in size with <50% respiratory variability, suggesting right atrial pressure of 15 mmHg. Conclusion(s)/Recommendation(s): No large vegetations seen visualized. aortic valve not well visualized. FINDINGS  Left Ventricle: Left ventricular ejection fraction, by estimation, is 60 to 65%. The left ventricle has normal function. The left ventricle has no regional wall motion abnormalities. The left ventricular internal cavity size was normal in size. There is  no left ventricular hypertrophy. Left ventricular diastolic parameters are indeterminate. Right Ventricle: The right ventricular size is normal. Right ventricular systolic function is normal. Tricuspid regurgitation signal is inadequate for assessing PA pressure. Left Atrium: Left atrial size was normal in size. Right Atrium: Right atrial size was normal in size. Pericardium: There is no evidence of pericardial effusion. Mitral Valve: No evidence of mitral valve regurgitation. MV peak gradient, 7.7 mmHg. The mean mitral valve gradient is 4.0 mmHg. Tricuspid Valve: Tricuspid valve regurgitation is not  demonstrated. Aortic Valve: The aortic valve was not well  visualized. Aortic valve regurgitation is not visualized. Pulmonic Valve: Pulmonic valve regurgitation is not visualized. Aorta: There is an aneurysm involving the aortic root measuring 42 mm. Venous: The inferior vena cava is dilated in size with less than 50% respiratory variability, suggesting right atrial pressure of 15 mmHg. IAS/Shunts: The interatrial septum was not well visualized.  LEFT VENTRICLE PLAX 2D LVIDd:         5.70 cm      Diastology LVIDs:         4.50 cm      LV e' medial:    5.87 cm/s LV PW:         0.90 cm      LV E/e' medial:  22.0 LV IVS:        0.80 cm      LV e' lateral:   9.57 cm/s LVOT diam:     2.30 cm      LV E/e' lateral: 13.5 LV SV:         73 LV SV Index:   33 LVOT Area:     4.15 cm  LV Volumes (MOD) LV vol d, MOD A2C: 104.0 ml LV vol d, MOD A4C: 63.5 ml LV vol s, MOD A2C: 54.0 ml LV vol s, MOD A4C: 38.3 ml LV SV MOD A2C:     50.0 ml LV SV MOD A4C:     63.5 ml LV SV MOD BP:      37.3 ml RIGHT VENTRICLE            IVC RV S prime:     9.57 cm/s  IVC diam: 2.50 cm TAPSE (M-mode): 0.9 cm LEFT ATRIUM             Index        RIGHT ATRIUM           Index LA diam:        3.80 cm 1.70 cm/m   RA Area:     21.30 cm LA Vol (A2C):   75.7 ml 33.79 ml/m  RA Volume:   56.10 ml  25.04 ml/m LA Vol (A4C):   58.6 ml 26.16 ml/m LA Biplane Vol: 72.2 ml 32.23 ml/m  AORTIC VALVE LVOT Vmax:   103.00 cm/s LVOT Vmean:  80.900 cm/s LVOT VTI:    0.176 m  AORTA Ao Root diam: 4.20 cm Ao Asc diam:  4.40 cm MITRAL VALVE MV Area (PHT): 5.16 cm     SHUNTS MV Area VTI:   3.18 cm     Systemic VTI:  0.18 m MV Peak grad:  7.7 mmHg     Systemic Diam: 2.30 cm MV Mean grad:  4.0 mmHg MV Vmax:       1.39 m/s MV Vmean:      93.7 cm/s MV Decel Time: 147 msec MV E velocity: 129.00 cm/s Carolan Clines Electronically signed by Carolan Clines Signature Date/Time: 07/18/2022/4:37:12 PM    Final    DG Knee Complete 4 Views Right  Result Date: 07/18/2022 CLINICAL DATA:   Right knee pain EXAM: RIGHT KNEE - COMPLETE 4 VIEW COMPARISON:  None Available. FINDINGS: Prior total right knee replacement. Moderate-to-large joint effusion. Hardware is intact. No perihardware lucency or fracture. Soft tissue swelling of the anterior knee. IMPRESSION: 1. Prior total right knee replacement without evidence of hardware complication. 2. Moderate-to-large joint effusion. Electronically Signed   By: Allegra Lai M.D.   On: 07/18/2022 11:19   MR Lumbar Spine W Wo Contrast  Addendum Date:  07/17/2022   ADDENDUM REPORT: 07/17/2022 17:13 IMPRESSION: 6. 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 Recommend follow-up ultrasound every 3 years. This recommendation follows ACR consensus guidelines: White Paper of the ACR Incidental Findings Committee II on Vascular Findings. J Am Coll Radiol 2013; 10:789-794. Electronically Signed   By: Wiliam Ke M.D.   On: 07/17/2022 17:13   Result Date: 07/17/2022 CLINICAL DATA:  Bilateral leg weakness, difficulty walking EXAM: MRI LUMBAR SPINE WITHOUT AND WITH CONTRAST TECHNIQUE: Multiplanar and multiecho pulse sequences of the lumbar spine were obtained without and with intravenous contrast. CONTRAST:  10mL GADAVIST GADOBUTROL 1 MMOL/ML IV SOLN COMPARISON:  08/07/2021 MRI lumbar spine FINDINGS: Segmentation:  5 lumbar type vertebral bodies. Alignment: Trace retrolisthesis of T12 on L1 and L1 on L2. 4 mm retrolisthesis of L3 on L4 and L4 on L5. Mild dextrocurvature. Vertebrae: No acute fracture, suspicious osseous lesion, or evidence of discitis. No abnormal osseous enhancement. Conus medullaris and cauda equina: Conus extends to the L1 level. Conus and cauda equina appear normal. No abnormal enhancement. Paraspinal and other soft tissues: Infrarenal abdominal aorta measures up to 3.1 cm. No lymphadenopathy. Atrophy of the right greater than left inferior paraspinous muscles. Disc levels: T12-L1: Trace retrolisthesis and minimal disc bulge. Mild facet  arthropathy. No spinal canal stenosis or neural foraminal narrowing. L1-L2: Trace retrolisthesis and minimal disc bulge. Mild facet arthropathy. No spinal canal stenosis or neural foraminal narrowing. L2-L3: Mild disc bulge. Moderate facet arthropathy. Narrowing of the lateral recesses. Mild to moderate spinal canal stenosis and mild left neural foraminal narrowing, which have progressed from the prior exam. L3-L4: Mild retrolisthesis and mild disc bulge. Moderate facet arthropathy. No spinal canal stenosis. Severe left and mild-to-moderate right neural foraminal narrowing, which has progressed on the right. L4-L5: Mild retrolisthesis and mild disc bulge. Moderate facet arthropathy. Narrowing of the lateral recesses. Mild spinal canal stenosis and moderate bilateral neural foraminal narrowing, unchanged. L5-S1: Mild disc bulge with right foraminal and extreme lateral protrusion. Moderate to severe facet arthropathy. Narrowing of the lateral recesses. No spinal canal stenosis. Mild right neural foraminal narrowing, unchanged. IMPRESSION: 1. 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 2. L3-L4 severe left and mild-to-moderate right neural foraminal narrowing, which has progressed on the right. 3. L4-L5 mild spinal canal stenosis and moderate bilateral neural foraminal narrowing. 4. L5-S1 mild right neural foraminal narrowing. 5. Narrowing of the lateral recesses at at L2-L3, L3-L4, L4-L5, and L5-S1 could affect the descending L3, L4, L5, and S1 nerve roots, respectively. Electronically Signed: By: Wiliam Ke M.D. On: 07/17/2022 14:53   DG Chest Port 1 View  Result Date: 07/17/2022 CLINICAL DATA:  Shortness of breath. EXAM: PORTABLE CHEST 1 VIEW COMPARISON:  Chest radiograph 04/05/2021, 03/07/2020 FINDINGS: The heart size and mediastinal contours are within normal limits. Aortic calcifications. Bibasilar patchy airspace  opacities. No pleural effusion or pneumothorax. Multilevel degenerative changes in the mid to distal thoracic spine. IMPRESSION: Bibasilar patchy airspace opacities may reflect atelectasis versus multifocal pneumonia in the appropriate clinical context. Electronically Signed   By: Sherron Ales M.D.   On: 07/17/2022 16:14   (Echo, Carotid, EGD, Colonoscopy, ERCP)    Subjective: No complains  Discharge Exam: Vitals:   07/27/22 2133 07/28/22 0206  BP: (!) 145/77 (!) 143/76  Pulse: 72 71  Resp:  20  Temp:  98.7 F (37.1 C)  SpO2:  97%   Vitals:   07/27/22 1250 07/27/22 2109 07/27/22 2133 07/28/22  0206  BP: 131/71 93/79 (!) 145/77 (!) 143/76  Pulse: 65 94 72 71  Resp: 20 20  20   Temp: 98.2 F (36.8 C) 97.6 F (36.4 C)  98.7 F (37.1 C)  TempSrc: Oral Oral  Oral  SpO2: 93% 94%  97%  Weight:      Height:        General: Pt is alert, awake, not in acute distress Cardiovascular: RRR, S1/S2 +, no rubs, no gallops Respiratory: CTA bilaterally, no wheezing, no rhonchi Abdominal: Soft, NT, ND, bowel sounds + Extremities: no edema, no cyanosis    The results of significant diagnostics from this hospitalization (including imaging, microbiology, ancillary and laboratory) are listed below for reference.     Microbiology: Recent Results (from the past 240 hour(s))  Body fluid culture w Gram Stain     Status: None   Collection Time: 07/18/22  2:23 PM   Specimen: Synovium; Synovial Fluid  Result Value Ref Range Status   Specimen Description   Final    SYNOVIAL R KNEE Performed at [facility], [address] [place], [place] [address]    Special Requests   Final    NONE Performed at [facility], [address] [place], [place] [address]    Gram Stain   Final    MODERATE WBC PRESENT, PREDOMINANTLY PMN RARE GRAM POSITIVE COCCI IN CHAINS INTRACELLULAR CRITICAL RESULT CALLED TO, READ BACK BY AND VERIFIED WITH: RN REBECCA FEWELL ON 07/18/22 @  2152 BY DRT    Culture   Final    RARE GROUP A STREP (S.PYOGENES) ISOLATED SUSCEPTIBILITIES PERFORMED ON PREVIOUS CULTURE WITHIN THE LAST 5 DAYS. Performed at [facility], [address] [place], [place] [address]    Report Status 07/22/2022 FINAL  Final  Anaerobic culture w Gram Stain     Status: None   Collection Time: 07/18/22  2:23 PM   Specimen: Synovium; Synovial Fluid  Result Value Ref Range Status   Specimen Description   Final    SYNOVIAL R KNEE Performed at [facility], [address] [place], [place] [address]    Special Requests   Final    NONE Performed at [facility], [address] [place], [place] [address]    Gram Stain   Final    NO ANAEROBES ISOLATED Performed at [facility], [address] [place], [place] [address]    Culture   Final    NO ANAEROBES ISOLATED; CULTURE IN PROGRESS FOR 5 DAYS   Report Status 07/23/2022 FINAL  Final  Culture, blood (Routine X 2) w Reflex to ID Panel     Status: None   Collection Time: 07/19/22  4:49 AM   Specimen: BLOOD LEFT HAND  Result Value Ref Range Status   Specimen Description   Final    BLOOD LEFT HAND Performed at [facility], [address] [place], [place] [address]    Special Requests   Final    BOTTLES DRAWN AEROBIC ONLY Blood Culture adequate volume Performed at [facility], [address] [place], [place] [address]    Culture   Final    NO GROWTH 5 DAYS Performed at [facility], [address] [place], [place] [address]    Report Status 07/24/2022 FINAL  Final  Culture, blood (Routine X 2) w Reflex to ID Panel     Status: None   Collection Time: 07/19/22  4:51  AM   Specimen: BLOOD RIGHT HAND  Result Value Ref Range Status   Specimen Description   Final    BLOOD RIGHT HAND Performed at [facility], [address], [place], [place] [address]    Special Requests   Final    BOTTLES DRAWN AEROBIC AND  ANAEROBIC Blood Culture adequate volume Performed at [facility], [address] [place], [place] [address]    Culture   Final    NO GROWTH 5 DAYS Performed at [facility], [address] [place], [place] [address]    Report Status 07/24/2022 FINAL  Final  Aerobic/Anaerobic Culture w Gram Stain (surgical/deep wound)     Status: None   Collection Time: 07/20/22  9:41 AM   Specimen: Synovial, Right Knee; Body Fluid  Result Value Ref Range Status   Specimen Description TISSUE  Final   Special Requests RIGHT KNEE 1  Final   Gram Stain NO ORGANISMS SEEN NO WBC SEEN   Final   Culture   Final    No growth aerobically or anaerobically. Performed at [facility], [address], [place], [place] [address]    Report Status 07/26/2022 FINAL  Final  Aerobic/Anaerobic Culture w Gram Stain (surgical/deep wound)     Status: None   Collection Time: 07/20/22  9:42 AM   Specimen: Synovial, Right Knee; Body Fluid  Result Value Ref Range Status   Specimen Description TISSUE  Final   Special Requests RIGHT KNEE 2  Final   Gram Stain NO ORGANISMS SEEN NO WBC SEEN   Final   Culture   Final    No growth aerobically or anaerobically. Performed at [facility], [address], [place], [place] [address]    Report Status 07/26/2022 FINAL  Final  Aerobic/Anaerobic Culture w Gram Stain (surgical/deep wound)     Status: None   Collection Time: 07/20/22  9:42 AM   Specimen: Synovial, Right Knee; Body Fluid  Result Value Ref Range Status   Specimen Description TISSUE  Final   Special Requests RIGHT KNEE 3  Final   Gram Stain NO ORGANISMS SEEN NO WBC SEEN   Final   Culture   Final    RARE GROUP A STREP (S.PYOGENES) ISOLATED Beta hemolytic streptococci are predictably susceptible to penicillin and other beta lactams. Susceptibility testing not routinely performed. NO ANAEROBES ISOLATED Performed at [facility], [address], [place], [place]  [address]    Report Status 07/26/2022 FINAL  Final     Labs: BNP (last 3 results) No results for input(s): "BNP" in the last 8760 hours. Basic Metabolic Panel: Recent Labs  Lab 07/22/22 0435 07/23/22 0429  NA 137 137  K 3.6 3.5  CL 97* 97*  CO2 33* 32  GLUCOSE 133* 118*  BUN 23 17  CREATININE 0.80 0.66  CALCIUM 8.3* 8.1*  MG 2.5*  --    Liver Function Tests: Recent Labs  Lab 07/22/22 0435 07/23/22 0429  AST 73* 61*  ALT 97* 101*  ALKPHOS 107 125  BILITOT 1.3* 1.6*  PROT 5.6* 5.9*  ALBUMIN 2.6* 2.5*   No results for input(s): "LIPASE", "AMYLASE" in the last 168 hours. No results for input(s): "AMMONIA" in the last 168 hours. CBC: Recent Labs  Lab 07/22/22 0435 07/23/22 0429  WBC 7.8 6.4  NEUTROABS 6.0 4.3  HGB 12.0* 12.5*  HCT 36.3* 37.7*  MCV 98.4 97.2  PLT 160 217   Cardiac Enzymes: No  results for input(s): "CKTOTAL", "CKMB", "CKMBINDEX", "TROPONINI" in the last 168 hours. BNP: Invalid input(s): "POCBNP" CBG: Recent Labs  Lab 07/24/22 1001 07/25/22 0744 07/26/22 0742 07/27/22 0744  GLUCAP 160* 116* 102* 103*   D-Dimer No results for input(s): "DDIMER" in the last 72 hours. Hgb A1c No results for input(s): "HGBA1C" in the last 72 hours. Lipid Profile No results for input(s): "CHOL", "HDL", "LDLCALC", "TRIG", "CHOLHDL", "LDLDIRECT" in the last 72 hours. Thyroid function studies No results for input(s): "TSH", "T4TOTAL", "T3FREE", "THYROIDAB" in the last 72 hours.  Invalid input(s): "FREET3" Anemia work up No results for input(s): "VITAMINB12", "FOLATE", "FERRITIN", "TIBC", "IRON", "RETICCTPCT" in the last 72 hours. Urinalysis    Component Value Date/Time   COLORURINE YELLOW 07/17/2022 1539   APPEARANCEUR CLEAR 07/17/2022 1539   LABSPEC 1.028 07/17/2022 1539   PHURINE 5.0 07/17/2022 1539   GLUCOSEU NEGATIVE 07/17/2022 1539   HGBUR MODERATE (A) 07/17/2022 1539   BILIRUBINUR NEGATIVE 07/17/2022 1539   KETONESUR NEGATIVE 07/17/2022 1539    PROTEINUR 100 (A) 07/17/2022 1539   UROBILINOGEN 0.2 02/04/2012 1108   NITRITE NEGATIVE 07/17/2022 1539   LEUKOCYTESUR NEGATIVE 07/17/2022 1539   Sepsis Labs Recent Labs  Lab 07/22/22 0435 07/23/22 0429  WBC 7.8 6.4   Microbiology Recent Results (from the past 240 hour(s))  Body fluid culture w Gram Stain     Status: None   Collection Time: 07/18/22  2:23 PM   Specimen: Synovium; Synovial Fluid  Result Value Ref Range Status   Specimen Description   Final    SYNOVIAL R KNEE Performed at [facility], [address] [place], [place] [address]    Special Requests   Final    NONE Performed at [facility], [address], [place], [place] [address]    Gram Stain   Final    MODERATE WBC PRESENT, PREDOMINANTLY PMN RARE GRAM POSITIVE COCCI IN CHAINS INTRACELLULAR CRITICAL RESULT CALLED TO, READ BACK BY AND VERIFIED WITH: RN REBECCA FEWELL ON 07/18/22 @ 2152 BY DRT    Culture   Final    RARE GROUP A STREP (S.PYOGENES) ISOLATED SUSCEPTIBILITIES PERFORMED ON PREVIOUS CULTURE WITHIN THE LAST 5 DAYS. Performed at [facility], [address] [place], [place] [address]    Report Status 07/22/2022 FINAL  Final  Anaerobic culture w Gram Stain     Status: None   Collection Time: 07/18/22  2:23 PM   Specimen: Synovium; Synovial Fluid  Result Value Ref Range Status   Specimen Description   Final    SYNOVIAL R KNEE Performed at [facility], [address] [place], [place] [address]    Special Requests   Final    NONE Performed at [facility], [address], [place], [place] [address]    Gram Stain   Final    NO ANAEROBES ISOLATED Performed at [facility], [address], [place], [place] [address]    Culture   Final    NO ANAEROBES ISOLATED; CULTURE IN PROGRESS FOR 5 DAYS   Report Status 07/23/2022 FINAL  Final  Culture, blood (Routine X 2) w Reflex to ID Panel     Status: None    Collection Time: 07/19/22  4:49 AM   Specimen: BLOOD LEFT HAND  Result Value Ref Range Status   Specimen Description   Final    BLOOD LEFT HAND Performed at [facility], [address], [place], [place] [address]  Special Requests   Final    BOTTLES DRAWN AEROBIC ONLY Blood Culture adequate volume Performed at [facility], [address] [place], [place] [address]    Culture   Final    NO GROWTH 5 DAYS Performed at [facility], [address], [place], [place] [address]    Report Status 07/24/2022 FINAL  Final  Culture, blood (Routine X 2) w Reflex to ID Panel     Status: None   Collection Time: 07/19/22  4:51 AM   Specimen: BLOOD RIGHT HAND  Result Value Ref Range Status   Specimen Description   Final    BLOOD RIGHT HAND Performed at [facility], [address] [place], [place] [address]    Special Requests   Final    BOTTLES DRAWN AEROBIC AND ANAEROBIC Blood Culture adequate volume Performed at [facility], [address], [place], [place] [address]    Culture   Final    NO GROWTH 5 DAYS Performed at [facility], [address] [place], [place] [address]    Report Status 07/24/2022 FINAL  Final  Aerobic/Anaerobic Culture w Gram Stain (surgical/deep wound)     Status: None   Collection Time: 07/20/22  9:41 AM   Specimen: Synovial, Right Knee; Body Fluid  Result Value Ref Range Status   Specimen Description TISSUE  Final   Special Requests RIGHT KNEE 1  Final   Gram Stain NO ORGANISMS SEEN NO WBC SEEN   Final   Culture   Final    No growth aerobically or anaerobically. Performed at [facility], [address] [place], [place] [address]    Report Status 07/26/2022 FINAL  Final  Aerobic/Anaerobic Culture w Gram Stain (surgical/deep wound)     Status: None   Collection Time: 07/20/22  9:42 AM   Specimen: Synovial, Right Knee; Body Fluid  Result Value Ref Range Status   Specimen Description  TISSUE  Final   Special Requests RIGHT KNEE 2  Final   Gram Stain NO ORGANISMS SEEN NO WBC SEEN   Final   Culture   Final    No growth aerobically or anaerobically. Performed at [facility], [address] [place], [place] [address]    Report Status 07/26/2022 FINAL  Final  Aerobic/Anaerobic Culture w Gram Stain (surgical/deep wound)     Status: None   Collection Time: 07/20/22  9:42 AM   Specimen: Synovial, Right Knee; Body Fluid  Result Value Ref Range Status   Specimen Description TISSUE  Final   Special Requests RIGHT KNEE 3  Final   Gram Stain NO ORGANISMS SEEN NO WBC SEEN   Final   Culture   Final    RARE GROUP A STREP (S.PYOGENES) ISOLATED Beta hemolytic streptococci are predictably susceptible to penicillin and other beta lactams. Susceptibility testing not routinely performed. NO ANAEROBES ISOLATED Performed at [facility], [address] [place], [place] [address]    Report Status 07/26/2022 FINAL  Final     SIGNED:   Marinda Elk, MD  [facility] 07/28/2022, 10:02 AM Pager   If 7PM-7AM, please contact night-coverage [email or web address] Password [email or web address]

## 2022-07-28 NOTE — Discharge Instructions (Signed)
INSTRUCTIONS AFTER JOINT REPLACEMENT   Remove items at home which could result in a fall. This includes throw rugs or furniture in walking pathways ICE to the affected joint every three hours while awake for 30 minutes at a time, for at least the first 3-5 days, and then as needed for pain and swelling.  Continue to use ice for pain and swelling. You may notice swelling that will progress down to the foot and ankle.  This is normal after surgery.  Elevate your leg when you are not up walking on it.   Continue to use the breathing machine you got in the hospital (incentive spirometer) which will help keep your temperature down.  It is common for your temperature to cycle up and down following surgery, especially at night when you are not up moving around and exerting yourself.  The breathing machine keeps your lungs expanded and your temperature down.   DIET:  As you were doing prior to hospitalization, we recommend a well-balanced diet.  DRESSING / WOUND CARE / SHOWERING  Keep the surgical dressing until follow up.  The dressing is water proof, so you can shower without any extra covering.  IF THE DRESSING FALLS OFF or the wound gets wet inside, change the dressing with sterile gauze.  Please use good hand washing techniques before changing the dressing.  Do not use any lotions or creams on the incision until instructed by your surgeon.    ACTIVITY  Increase activity slowly as tolerated, but follow the weight bearing instructions below.   No driving for 6 weeks or until further direction given by your physician.  You cannot drive while taking narcotics.  No lifting or carrying greater than 10 lbs. until further directed by your surgeon. Avoid periods of inactivity such as sitting longer than an hour when not asleep. This helps prevent blood clots.  You may return to work once you are authorized by your doctor.     WEIGHT BEARING   Weight bearing as tolerated with assist device (walker, cane,  etc) as directed, use it as long as suggested by your surgeon or therapist, typically at least 4-6 weeks.   EXERCISES  Results after joint replacement surgery are often greatly improved when you follow the exercise, range of motion and muscle strengthening exercises prescribed by your doctor. Safety measures are also important to protect the joint from further injury. Any time any of these exercises cause you to have increased pain or swelling, decrease what you are doing until you are comfortable again and then slowly increase them. If you have problems or questions, call your caregiver or physical therapist for advice.   Rehabilitation is important following a joint replacement. After just a few days of immobilization, the muscles of the leg can become weakened and shrink (atrophy).  These exercises are designed to build up the tone and strength of the thigh and leg muscles and to improve motion. Often times heat used for twenty to thirty minutes before working out will loosen up your tissues and help with improving the range of motion but do not use heat for the first two weeks following surgery (sometimes heat can increase post-operative swelling).   These exercises can be done on a training (exercise) mat, on the floor, on a table or on a bed. Use whatever works the best and is most comfortable for you.    Use music or television while you are exercising so that the exercises are a pleasant break in your   day. This will make your life better with the exercises acting as a break in your routine that you can look forward to.   Perform all exercises about fifteen times, three times per day or as directed.  You should exercise both the operative leg and the other leg as well.  Exercises include:   Quad Sets - Tighten up the muscle on the front of the thigh (Quad) and hold for 5-10 seconds.   Straight Leg Raises - With your knee straight (if you were given a brace, keep it on), lift the leg to 60  degrees, hold for 3 seconds, and slowly lower the leg.  Perform this exercise against resistance later as your leg gets stronger.  Leg Slides: Lying on your back, slowly slide your foot toward your buttocks, bending your knee up off the floor (only go as far as is comfortable). Then slowly slide your foot back down until your leg is flat on the floor again.  Angel Wings: Lying on your back spread your legs to the side as far apart as you can without causing discomfort.  Hamstring Strength:  Lying on your back, push your heel against the floor with your leg straight by tightening up the muscles of your buttocks.  Repeat, but this time bend your knee to a comfortable angle, and push your heel against the floor.  You may put a pillow under the heel to make it more comfortable if necessary.   A rehabilitation program following joint replacement surgery can speed recovery and prevent re-injury in the future due to weakened muscles. Contact your doctor or a physical therapist for more information on knee rehabilitation.    CONSTIPATION  Constipation is defined medically as fewer than three stools per week and severe constipation as less than one stool per week.  Even if you have a regular bowel pattern at home, your normal regimen is likely to be disrupted due to multiple reasons following surgery.  Combination of anesthesia, postoperative narcotics, change in appetite and fluid intake all can affect your bowels.   YOU MUST use at least one of the following options; they are listed in order of increasing strength to get the job done.  They are all available over the counter, and you may need to use some, POSSIBLY even all of these options:    Drink plenty of fluids (prune juice may be helpful) and high fiber foods Colace 100 mg by mouth twice a day  Senokot for constipation as directed and as needed Dulcolax (bisacodyl), take with full glass of water  Miralax (polyethylene glycol) once or twice a day as  needed.  If you have tried all these things and are unable to have a bowel movement in the first 3-4 days after surgery call either your surgeon or your primary doctor.    If you experience loose stools or diarrhea, hold the medications until you stool forms back up.  If your symptoms do not get better within 1 week or if they get worse, check with your doctor.  If you experience "the worst abdominal pain ever" or develop nausea or vomiting, please contact the office immediately for further recommendations for treatment.   ITCHING:  If you experience itching with your medications, try taking only a single pain pill, or even half a pain pill at a time.  You can also use Benadryl over the counter for itching or also to help with sleep.   TED HOSE STOCKINGS:  Use stockings on both   legs until for at least 2 weeks or as directed by physician office. They may be removed at night for sleeping.  MEDICATIONS:  See your medication summary on the "After Visit Summary" that nursing will review with you.  You may have some home medications which will be placed on hold until you complete the course of blood thinner medication.  It is important for you to complete the blood thinner medication as prescribed.   Blood clot prevention (DVT Prophylaxis): After surgery you are at an increased risk for a blood clot. You were restarted on your Xarelto to help reduce your risk of getting a blood clot. This will help prevent a blood clot. Signs of a pulmonary embolus (blood clot in the lungs) include sudden short of breath, feeling lightheaded or dizzy, chest pain with a deep breath, rapid pulse rapid breathing. Signs of a blood clot in your arms or legs include new unexplained swelling and cramping, warm, red or darkened skin around the painful area. Please call the office or 911 right away if these signs or symptoms develop.  PRECAUTIONS:  If you experience chest pain or shortness of breath - call 911 immediately for  transfer to the hospital emergency department.   If you develop a fever greater that 101 F, purulent drainage from wound, increased redness or drainage from wound, foul odor from the wound/dressing, or calf pain - CONTACT YOUR SURGEON.                                                   FOLLOW-UP APPOINTMENTS:  If you do not already have a post-op appointment, please call the office for an appointment to be seen by your surgeon.  Guidelines for how soon to be seen are listed in your "After Visit Summary", but are typically between 2-3 weeks after surgery.  OTHER INSTRUCTIONS:   POST-OPERATIVE OPIOID TAPER INSTRUCTIONS: It is important to wean off of your opioid medication as soon as possible. If you do not need pain medication after your surgery it is ok to stop day one. Opioids include: Codeine, Hydrocodone(Norco, Vicodin), Oxycodone(Percocet, oxycontin) and hydromorphone amongst others.  Long term and even short term use of opiods can cause: Increased pain response Dependence Constipation Depression Respiratory depression And more.  Withdrawal symptoms can include Flu like symptoms Nausea, vomiting And more Techniques to manage these symptoms Hydrate well Eat regular healthy meals Stay active Use relaxation techniques(deep breathing, meditating, yoga) Do Not substitute Alcohol to help with tapering If you have been on opioids for less than two weeks and do not have pain than it is ok to stop all together.  Plan to wean off of opioids This plan should start within one week post op of your joint replacement. Maintain the same interval or time between taking each dose and first decrease the dose.  Cut the total daily intake of opioids by one tablet each day Next start to increase the time between doses. The last dose that should be eliminated is the evening dose.   MAKE SURE YOU:  Understand these instructions.  Get help right away if you are not doing well or get worse.     Thank you for letting us be a part of your medical care team.  It is a privilege we respect greatly.  We hope these instructions will help you stay  on track for a fast and full recovery!

## 2022-07-28 NOTE — Progress Notes (Signed)
     Subjective: Patient continues to be limited by significant weakness.  Knees overall are feeling better.  Endorses minimal pain.  Good mobility and strength on the left side weak hip and quad function on the right.  Objective:   VITALS:   Vitals:   07/27/22 1250 07/27/22 2109 07/27/22 2133 07/28/22 0206  BP: 131/71 93/79 (!) 145/77 (!) 143/76  Pulse: 65 94 72 71  Resp: 20 20  20   Temp: 98.2 F (36.8 C) 97.6 F (36.4 C)  98.7 F (37.1 C)  TempSrc: Oral Oral  Oral  SpO2: 93% 94%  97%  Weight:      Height:        Sensation intact distally Intact pulses distally Dorsiflexion/Plantar flexion intact Incision: dressing C/D/I Compartment soft   Lab Results  Component Value Date   WBC 6.4 07/23/2022   HGB 12.5 (L) 07/23/2022   HCT 37.7 (L) 07/23/2022   MCV 97.2 07/23/2022   PLT 217 07/23/2022   BMET    Component Value Date/Time   NA 137 07/23/2022 0429   NA 143 07/27/2019 1513   K 3.5 07/23/2022 0429   CL 97 (L) 07/23/2022 0429   CO2 32 07/23/2022 0429   GLUCOSE 118 (H) 07/23/2022 0429   BUN 17 07/23/2022 0429   BUN 11 07/27/2019 1513   CREATININE 0.66 07/23/2022 0429   CALCIUM 8.1 (L) 07/23/2022 0429   GFRNONAA >60 07/23/2022 0429      Xray: X-rays right knee demonstrate no fracture description of the rash is a normalities  Assessment/Plan: 8 Days Post-Op   Principal Problem:   Sepsis due to group A Streptococcus without acute organ dysfunction Active Problems:   HTN (hypertension)   Stroke   Sleep apnea   Right knee DJD   Hypercholesteremia   Malignant neoplasm of prostate   Pneumonia   Lower extremity weakness   Lower back pain   Paroxysmal A-fib   Streptococcal bacteremia   Knee pain, right   Prosthetic joint infection  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   Follow-up IntraOp cultures. Imaging: PACU xrays Dressing: Incisional wound VAC removed today, incision is healing well. Applied an Aquacel dressing to remain on until follow-up. DVT prophylaxis: Xarelto 10 mg postop day 1-2, resume full dose Xarelto postop day 3 Follow up: Follow-up 4/16 for suture removal Address: [address], [place], [place] [address]  Office Phone: [phone]   Joen Laura 07/28/2022, 9:26 AM   Weber Cooks, MD  Contact information:   [phone] 7am-5pm epic message Dr. Blanchie Dessert, or call office for patient follow up: [phone] After hours and holidays please check [email or web address] for group call information for [facility]

## 2022-07-28 NOTE — TOC Progression Note (Signed)
Transition of Care [facility]) - Progression Note    Patient Details  Name: Caleb Barton MRN: [id number] Date of Birth: 1940/09/29  [facility]) CM/SW Contact  Larrie Kass, LCSW Phone Number: 07/28/2022, 9:55 AM  Clinical Narrative:    Pt's insurance [name] was approved for [facility], MD and RN made aware. TOC to follow   Expected Discharge Plan: Skilled Nursing Facility Barriers to Discharge: Continued Medical Work up  Expected Discharge Plan and Services       Living arrangements for the past 2 months: Single Family Home                                       Social Determinants of Health (SDOH) Interventions SDOH Screenings   Food Insecurity: No Food Insecurity (07/18/2022)  Housing: Low Risk  (07/18/2022)  Transportation Needs: No Transportation Needs (07/18/2022)  Utilities: Not At Risk (07/18/2022)  Tobacco Use: Medium Risk (07/21/2022)    Readmission Risk Interventions     No data to display

## 2022-07-28 NOTE — Progress Notes (Signed)
AVS given to patient and explained at the bedside and placed in the transportation packet for [facility]. Medications and follow up appointments have been explained with pt verbalizing understanding. Report has been called to [facility], the receiving facility.

## 2022-07-28 NOTE — TOC Transition Note (Signed)
Transition of Care [facility]) - CM/SW Discharge Note   Patient Details  Name: Caleb Barton MRN: [id number] Date of Birth: 09/02/1940  [facility] CM/SW Contact:  Larrie Kass, LCSW Phone Number: 07/28/2022, 10:44 AM   Clinical Narrative:    CSW spoke with pt's wife informed her pt's insurance auth was approved and will d/c today to facility. Pt's wife is requesting transportation assistance , she would like it scheduled for 3pm. Pt's room assignment [address] , call report [phone]. TOC sign off.        Barriers to Discharge: Continued Medical Work up   Patient Goals and CMS Choice CMS Medicare.gov Compare Post Acute Care list provided to:: Patient Choice offered to / list presented to : Patient  Discharge Placement                         Discharge Plan and Services Additional resources added to the After Visit Summary for                                       Social Determinants of Health (SDOH) Interventions SDOH Screenings   Food Insecurity: No Food Insecurity (07/18/2022)  Housing: Low Risk  (07/18/2022)  Transportation Needs: No Transportation Needs (07/18/2022)  Utilities: Not At Risk (07/18/2022)  Tobacco Use: Medium Risk (07/21/2022)     Readmission Risk Interventions     No data to display

## 2022-07-30 DIAGNOSIS — E559 Vitamin D deficiency, unspecified: Secondary | ICD-10-CM | POA: Diagnosis not present

## 2022-07-30 DIAGNOSIS — E7800 Pure hypercholesterolemia, unspecified: Secondary | ICD-10-CM | POA: Diagnosis not present

## 2022-07-30 DIAGNOSIS — G4733 Obstructive sleep apnea (adult) (pediatric): Secondary | ICD-10-CM | POA: Diagnosis not present

## 2022-07-30 DIAGNOSIS — T8450XA Infection and inflammatory reaction due to unspecified internal joint prosthesis, initial encounter: Secondary | ICD-10-CM | POA: Diagnosis not present

## 2022-07-30 DIAGNOSIS — N400 Enlarged prostate without lower urinary tract symptoms: Secondary | ICD-10-CM | POA: Diagnosis not present

## 2022-07-30 DIAGNOSIS — E119 Type 2 diabetes mellitus without complications: Secondary | ICD-10-CM | POA: Diagnosis not present

## 2022-07-30 DIAGNOSIS — I10 Essential (primary) hypertension: Secondary | ICD-10-CM | POA: Diagnosis not present

## 2022-07-30 DIAGNOSIS — D649 Anemia, unspecified: Secondary | ICD-10-CM | POA: Diagnosis not present

## 2022-07-30 DIAGNOSIS — A400 Sepsis due to streptococcus, group A: Secondary | ICD-10-CM | POA: Diagnosis not present

## 2022-07-30 DIAGNOSIS — I480 Paroxysmal atrial fibrillation: Secondary | ICD-10-CM | POA: Diagnosis not present

## 2022-07-30 DIAGNOSIS — E785 Hyperlipidemia, unspecified: Secondary | ICD-10-CM | POA: Diagnosis not present

## 2022-07-30 DIAGNOSIS — R7881 Bacteremia: Secondary | ICD-10-CM | POA: Diagnosis not present

## 2022-08-01 DIAGNOSIS — I10 Essential (primary) hypertension: Secondary | ICD-10-CM | POA: Diagnosis not present

## 2022-08-01 DIAGNOSIS — R69 Illness, unspecified: Secondary | ICD-10-CM | POA: Diagnosis not present

## 2022-08-02 DIAGNOSIS — G4733 Obstructive sleep apnea (adult) (pediatric): Secondary | ICD-10-CM | POA: Diagnosis not present

## 2022-08-05 DIAGNOSIS — T8453XD Infection and inflammatory reaction due to internal right knee prosthesis, subsequent encounter: Secondary | ICD-10-CM | POA: Diagnosis not present

## 2022-08-06 DIAGNOSIS — I10 Essential (primary) hypertension: Secondary | ICD-10-CM | POA: Diagnosis not present

## 2022-08-06 DIAGNOSIS — D649 Anemia, unspecified: Secondary | ICD-10-CM | POA: Diagnosis not present

## 2022-08-06 DIAGNOSIS — G4733 Obstructive sleep apnea (adult) (pediatric): Secondary | ICD-10-CM | POA: Diagnosis not present

## 2022-08-06 DIAGNOSIS — N400 Enlarged prostate without lower urinary tract symptoms: Secondary | ICD-10-CM | POA: Diagnosis not present

## 2022-08-06 DIAGNOSIS — E785 Hyperlipidemia, unspecified: Secondary | ICD-10-CM | POA: Diagnosis not present

## 2022-08-06 DIAGNOSIS — A400 Sepsis due to streptococcus, group A: Secondary | ICD-10-CM | POA: Diagnosis not present

## 2022-08-12 ENCOUNTER — Ambulatory Visit: Payer: Medicare HMO | Admitting: Internal Medicine

## 2022-08-12 ENCOUNTER — Encounter: Payer: Self-pay | Admitting: Internal Medicine

## 2022-08-12 ENCOUNTER — Other Ambulatory Visit: Payer: Self-pay

## 2022-08-12 VITALS — BP 127/77 | HR 76 | Resp 16 | Ht 69.0 in | Wt 233.6 lb

## 2022-08-12 DIAGNOSIS — B181 Chronic viral hepatitis B without delta-agent: Secondary | ICD-10-CM | POA: Diagnosis not present

## 2022-08-12 DIAGNOSIS — I480 Paroxysmal atrial fibrillation: Secondary | ICD-10-CM | POA: Diagnosis not present

## 2022-08-12 DIAGNOSIS — T8450XD Infection and inflammatory reaction due to unspecified internal joint prosthesis, subsequent encounter: Secondary | ICD-10-CM

## 2022-08-12 DIAGNOSIS — I10 Essential (primary) hypertension: Secondary | ICD-10-CM | POA: Diagnosis not present

## 2022-08-12 DIAGNOSIS — T8453XD Infection and inflammatory reaction due to internal right knee prosthesis, subsequent encounter: Secondary | ICD-10-CM | POA: Diagnosis not present

## 2022-08-12 NOTE — Patient Instructions (Signed)
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  Status post DAIR on 07/20/22 and completing 6 weeks of IV therapy on 08/31/22.  Will continue penicillin G 24 million units IV q24h as continuous infusion through 5/12 and then have PICC line removed.  Will then place on suppressive Amoxicillin 1gm PO TID for at least 6 more weeks to complete 12 weeks of total treatment and likely plan for further extended suppression for at least 6 months.  Plan for RTC prior to May 12. 

## 2022-08-12 NOTE — Progress Notes (Unsigned)
Patient on continuous IV penicillin. Infusion paused, line flushed with 10 mL normal saline. 5 mL blood drawn through PICC line and wasted.  Labs drawn via PICC line per Dr. Earlene Plater . Line flushed with 10 mL normal saline and continuous infusion resumed. Patient tolerated well.   Sandie Ano, RN

## 2022-08-12 NOTE — Assessment & Plan Note (Signed)
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  Status post DAIR on 07/20/22 and completing 6 weeks of IV therapy on 08/31/22.  Will continue penicillin G 24 million units IV q24h as continuous infusion through 5/12 and then have PICC line removed.  Will then place on suppressive Amoxicillin 1gm PO TID for at least 6 more weeks to complete 12 weeks of total treatment and likely plan for further extended suppression for at least 6 months.  Plan for RTC prior to May 12.

## 2022-08-12 NOTE — Assessment & Plan Note (Signed)
Will repeat all his hepatitis serologies today given positive hepatitis B surface Ag with prior normal LFTs in March 2023. Unclear where he may have acquired infection given lack of risk factors.

## 2022-08-12 NOTE — Progress Notes (Unsigned)
[facility]  CHIEF COMPLAINT:    Follow up for knee infection  SUBJECTIVE:    Caleb Barton is a 82 y.o. male with PMHx as below who presents to the clinic for knee infection.   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  Status post DAIR with orthopedic surgery 07/20/22 with OR cultures that isolated GAS as well.  He was discharged on IV penicillin via PICC line through 08/31/22 to complete 6 weeks of IV therapy.  He was discharged to SNF and reports doing well.  He is planning to go home on Thursday.    Additionally, as part of his admission patient was found to have elevated LFTs and found to have positive hepatitis B surface Ag.  He reports no prior hepatitis B history.  LFTs in March 2023 were normalized.   Please see A&P for the details of today's visit and status of the patient's medical problems.   Patient's Medications  New Prescriptions   No medications on file  Previous Medications   ASCORBIC ACID (VITAMIN C) 1000 MG TABLET    Take 1,000 mg by mouth daily.   ATORVASTATIN (LIPITOR) 40 MG TABLET    Take 40 mg by mouth at bedtime.   DORZOLAMIDE-TIMOLOL (COSOPT) 22.3-6.8 MG/ML OPHTHALMIC SOLUTION    Place 1 drop into both eyes 2 (two) times daily.    HYDROCHLOROTHIAZIDE (HYDRODIURIL) 25 MG TABLET    Take 37.5 mg by mouth at bedtime.   LATANOPROST (XALATAN) 0.005 % OPHTHALMIC SOLUTION    Place 1 drop into both eyes at bedtime.    LOSARTAN (COZAAR) 50 MG TABLET    TAKE ONE AND ONE-HALF TABLET BY MOUTH DAILY   METHOCARBAMOL (ROBAXIN) 500 MG TABLET    Take 500 mg by mouth See admin instructions. Take 500 mg by mouth every 4-6 hours as needed for muscle spasms   MULTIPLE VITAMINS-MINERALS (CENTRUM SILVER 50+MEN) TABS    Take 1 tablet by mouth at bedtime.   OXYCODONE (OXY IR/ROXICODONE) 5 MG IMMEDIATE RELEASE TABLET    Take 1 tablet (5 mg total) by mouth every 4  (four) hours as needed for moderate pain.   OXYCODONE (OXY IR/ROXICODONE) 5 MG IMMEDIATE RELEASE TABLET    Take 1 tablet (5 mg total) by mouth every 4 (four) hours as needed for moderate pain.   PENICILLIN G IVPB    Inject 24 Million Units into the vein daily. Administer as continuous infusion Indication:  Group A strep bacteremia/R knee PJI First Dose: Yes Last Day of Therapy:  08/31/22 Labs - Once weekly:  CBC/D and BMP, Labs - Every other week:  ESR and CRP Method of administration: Elastomeric (Continuous infusion) Method of administration may be changed at the discretion of home infusion pharmacist based upon assessment of the patient and/or caregiver's ability to self-administer the medication ordered.   POLYETHYLENE GLYCOL (MIRALAX / GLYCOLAX) 17 G PACKET    Take 17 g by mouth daily as needed for mild constipation.   RIVAROXABAN (XARELTO) 20 MG TABS TABLET    Take 1 tablet (20 mg total) by mouth daily with supper.   TAMSULOSIN (FLOMAX) 0.4 MG CAPS CAPSULE    Take 0.4 mg by mouth at bedtime.   TYLENOL 500 MG TABLET    Take 500-1,000 mg by mouth every 6 (six) hours as needed for mild pain or headache.  Modified Medications  No medications on file  Discontinued Medications   No medications on file      Past Medical History:  Diagnosis Date   Cancer    precancerous skin cells reomved from face   Dyspnea    Dysrhythmia    RBBB   Hypercholesteremia    takes Lipitor daily and niacin daily   Hypertension    takes HCTZ daily and losartan daily   Prostate cancer    Right knee DJD    Sleep apnea    cpap;sleep study done at home;request report from dr.gates   Stroke    TIA 15 yrs ago    Social History   Tobacco Use   Smoking status: Former    Packs/day: 1.00    Years: 20.00    Additional pack years: 0.00    Total pack years: 20.00    Types: Cigarettes   Smokeless tobacco: Never   Tobacco comments:    quit 35 yrs ago  Vaping Use   Vaping Use: Never used  Substance Use  Topics   Alcohol use: Yes    Comment: social   Drug use: No    Family History  Problem Relation Age of Onset   Stroke Mother    Breast cancer Mother    Bladder Cancer Brother    Breast cancer Other    Prostate cancer Neg Hx    Pancreatic cancer Neg Hx     Allergies  Allergen Reactions   Lisinopril Cough   Sulfa Antibiotics Rash    Review of Systems  All other systems reviewed and are negative.    OBJECTIVE:    Vitals:   08/12/22 1551  BP: 127/77  Pulse: 76  Resp: 16  SpO2: 97%  Weight: 233 lb 9.6 oz (106 kg)  Height: 5\' 9"  (1.753 m)   Body mass index is 34.5 kg/m.  Physical Exam Constitutional:      Appearance: Normal appearance.  Eyes:     Extraocular Movements: Extraocular movements intact.     Conjunctiva/sclera: Conjunctivae normal.  Pulmonary:     Effort: Pulmonary effort is normal. No respiratory distress.  Abdominal:     General: There is no distension.     Palpations: Abdomen is soft.  Musculoskeletal:        General: Normal range of motion.     Cervical back: Normal range of motion and neck supple.  Skin:    General: Skin is warm and dry.  Neurological:     General: No focal deficit present.     Mental Status: He is alert and oriented to person, place, and time.  Psychiatric:        Mood and Affect: Mood normal.        Behavior: Behavior normal.      Labs and Microbiology:    Latest Ref Rng & Units 07/23/2022    4:29 AM 07/22/2022    4:35 AM 07/21/2022    4:15 AM  CBC  WBC 4.0 - 10.5 K/uL 6.4  7.8  11.0   Hemoglobin 13.0 - 17.0 g/dL 30.8  65.7  84.6   Hematocrit 39.0 - 52.0 % 37.7  36.3  35.5   Platelets 150 - 400 K/uL 217  160  127       Latest Ref Rng & Units 07/23/2022    4:29 AM 07/22/2022    4:35 AM 07/21/2022    4:15 AM  CMP  Glucose 70 - 99 mg/dL 962  952  841   BUN 8 -  23 mg/dL Creatinine 0.61 - 1.24 mg/dL 4.54  0.98  1.19   Sodium 135 - 145 mmol/L 137  137  135   Potassium 3.5 - 5.1 mmol/L 3.5  3.6  3.9    Chloride 98 - 111 mmol/L 97  97  98   CO2 22 - 32 mmol/L 32  33  30   Calcium 8.9 - 10.3 mg/dL 8.1  8.3  8.4   Total Protein 6.5 - 8.1 g/dL 5.9  5.6  5.6   Total Bilirubin 0.3 - 1.2 mg/dL 1.6  1.3  1.7   Alkaline Phos 38 - 126 U/L 125  107  93   AST 15 - 41 U/L 61  73  56   ALT 0 - 44 U/L 101  97  72      No results found for this or any previous visit (from the past 240 hour(s)).    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  Status post DAIR on 07/20/22 and completing 6 weeks of IV therapy on 08/31/22.  Will continue penicillin G 24 million units IV q24h as continuous infusion through 5/12 and then have PICC line removed.  Will then place on suppressive Amoxicillin 1gm PO TID for at least 6 more weeks to complete 12 weeks of total treatment and likely plan for further extended suppression for at least 6 months.  Plan for RTC prior to May 12.  Chronic viral hepatitis B without delta agent and without coma Will repeat all his hepatitis serologies today given positive hepatitis B surface Ag with prior normal LFTs in March 2023. Unclear where he may have acquired infection given lack of risk factors.     Orders Placed This Encounter  Procedures   Hepatitis B core antibody, total   Hepatitis B DNA, ultraquantitative, PCR   Hepatitis B e antigen   Hepatitis B surface antibody,qualitative   Hepatitis B surface antigen   Liver Fibrosis, FibroTest-ActiTest         Vedia Coffer for Infectious Disease [facility] 08/12/2022, 4:29 PM  I have personally spent 30 minutes involved in face-to-face and non-face-to-face activities for this patient on the day of the visit. Professional time spent includes the following activities: Preparing to see the patient (review of tests), Obtaining and/or reviewing separately obtained history (admission/discharge record), Performing  a medically appropriate examination and/or evaluation , Ordering medications/tests/procedures, referring and communicating with other health care professionals, Documenting clinical information in the EMR, Independently interpreting results (not separately reported), Communicating results to the patient/family/caregiver, Counseling and educating the patient/family/caregiver and Care coordination (not separately reported).

## 2022-08-13 DIAGNOSIS — I480 Paroxysmal atrial fibrillation: Secondary | ICD-10-CM | POA: Diagnosis not present

## 2022-08-13 DIAGNOSIS — E785 Hyperlipidemia, unspecified: Secondary | ICD-10-CM | POA: Diagnosis not present

## 2022-08-13 DIAGNOSIS — I10 Essential (primary) hypertension: Secondary | ICD-10-CM | POA: Diagnosis not present

## 2022-08-13 DIAGNOSIS — N400 Enlarged prostate without lower urinary tract symptoms: Secondary | ICD-10-CM | POA: Diagnosis not present

## 2022-08-13 DIAGNOSIS — R7881 Bacteremia: Secondary | ICD-10-CM | POA: Diagnosis not present

## 2022-08-13 LAB — HEPATIC FUNCTION PANEL
AG Ratio: 1.4 (calc) (ref 1.0–2.5)
ALT: 20 U/L (ref 9–46)
Alkaline phosphatase (APISO): 101 U/L (ref 35–144)
Bilirubin, Direct: 0.2 mg/dL (ref 0.0–0.2)
Indirect Bilirubin: 0.5 mg/dL (calc) (ref 0.2–1.2)

## 2022-08-14 DIAGNOSIS — T8450XA Infection and inflammatory reaction due to unspecified internal joint prosthesis, initial encounter: Secondary | ICD-10-CM | POA: Diagnosis not present

## 2022-08-15 ENCOUNTER — Telehealth: Payer: Self-pay

## 2022-08-15 DIAGNOSIS — I480 Paroxysmal atrial fibrillation: Secondary | ICD-10-CM | POA: Diagnosis not present

## 2022-08-15 DIAGNOSIS — B181 Chronic viral hepatitis B without delta-agent: Secondary | ICD-10-CM | POA: Diagnosis not present

## 2022-08-15 DIAGNOSIS — J189 Pneumonia, unspecified organism: Secondary | ICD-10-CM | POA: Diagnosis not present

## 2022-08-15 DIAGNOSIS — C61 Malignant neoplasm of prostate: Secondary | ICD-10-CM | POA: Diagnosis not present

## 2022-08-15 DIAGNOSIS — I10 Essential (primary) hypertension: Secondary | ICD-10-CM | POA: Diagnosis not present

## 2022-08-15 DIAGNOSIS — N400 Enlarged prostate without lower urinary tract symptoms: Secondary | ICD-10-CM | POA: Diagnosis not present

## 2022-08-15 DIAGNOSIS — T8453XA Infection and inflammatory reaction due to internal right knee prosthesis, initial encounter: Secondary | ICD-10-CM | POA: Diagnosis not present

## 2022-08-15 DIAGNOSIS — Z452 Encounter for adjustment and management of vascular access device: Secondary | ICD-10-CM | POA: Diagnosis not present

## 2022-08-15 DIAGNOSIS — E860 Dehydration: Secondary | ICD-10-CM | POA: Diagnosis not present

## 2022-08-15 DIAGNOSIS — A400 Sepsis due to streptococcus, group A: Secondary | ICD-10-CM | POA: Diagnosis not present

## 2022-08-15 LAB — HEPATITIS B DNA, ULTRAQUANTITATIVE, PCR
Hepatitis B DNA: NOT DETECTED IU/mL
Hepatitis B virus DNA: NOT DETECTED {Log_IU}/mL

## 2022-08-15 NOTE — Telephone Encounter (Signed)
Per Pam with [facility]  Just making you aware we are taking Caleb Barton home from [facility] on 4/25 to continue IVABX at home.  I know he saw Dr. Earlene Plater yesterday and seems to be no change in treatment plan.  He will have [facility]. We have shared the fax with the [facility] team so you should continue to receive labs weekly.  Let us know if you need further.

## 2022-08-15 NOTE — Telephone Encounter (Signed)
Got it thank you

## 2022-08-18 ENCOUNTER — Telehealth: Payer: Self-pay | Admitting: Pharmacist

## 2022-08-18 DIAGNOSIS — E860 Dehydration: Secondary | ICD-10-CM | POA: Diagnosis not present

## 2022-08-18 DIAGNOSIS — I10 Essential (primary) hypertension: Secondary | ICD-10-CM | POA: Diagnosis not present

## 2022-08-18 DIAGNOSIS — B181 Chronic viral hepatitis B without delta-agent: Secondary | ICD-10-CM | POA: Diagnosis not present

## 2022-08-18 DIAGNOSIS — J189 Pneumonia, unspecified organism: Secondary | ICD-10-CM | POA: Diagnosis not present

## 2022-08-18 DIAGNOSIS — T8453XA Infection and inflammatory reaction due to internal right knee prosthesis, initial encounter: Secondary | ICD-10-CM | POA: Diagnosis not present

## 2022-08-18 DIAGNOSIS — Z452 Encounter for adjustment and management of vascular access device: Secondary | ICD-10-CM | POA: Diagnosis not present

## 2022-08-18 DIAGNOSIS — C61 Malignant neoplasm of prostate: Secondary | ICD-10-CM | POA: Diagnosis not present

## 2022-08-18 DIAGNOSIS — A400 Sepsis due to streptococcus, group A: Secondary | ICD-10-CM | POA: Diagnosis not present

## 2022-08-18 DIAGNOSIS — I480 Paroxysmal atrial fibrillation: Secondary | ICD-10-CM | POA: Diagnosis not present

## 2022-08-18 DIAGNOSIS — N400 Enlarged prostate without lower urinary tract symptoms: Secondary | ICD-10-CM | POA: Diagnosis not present

## 2022-08-18 NOTE — Telephone Encounter (Signed)
Home health nurse called asking about drawing patient labs. She asked how we drew them and if they were from his PICC. I reviewed Caleb Peru, RN, note from when patient saw Dr. Earlene Plater. Told nurse that we briefly paused the infusion, flushed it, drew labs, flushed the line again, and restarted the infusion. Advised that there was no period of time that that infusion needs to be paused before labs are drawn. She asked what color tubes are used for the labs to which I told her that I had no idea. Asked her to check with [facility] to see if they could answer her questions. She verbalized understanding.  Nthony Lefferts L. Amaia Lavallie, PharmD, BCIDP, AAHIVP, CPP Clinical Pharmacist Practitioner Infectious Diseases Clinical Pharmacist [facility] 08/18/2022, 10:20 AM

## 2022-08-18 NOTE — Telephone Encounter (Signed)
Received message from Dr. Luciana Axe stating he received a call from patient's home health nurse that she was having difficulty disconnecting blood draw supplies from the PICC line.   Called HH RN, Vikki Ports, she states the issue has been resolved.   Vikki Ports: [phone]   Sandie Ano, RN

## 2022-08-19 DIAGNOSIS — J189 Pneumonia, unspecified organism: Secondary | ICD-10-CM | POA: Diagnosis not present

## 2022-08-19 DIAGNOSIS — Z452 Encounter for adjustment and management of vascular access device: Secondary | ICD-10-CM | POA: Diagnosis not present

## 2022-08-19 DIAGNOSIS — A400 Sepsis due to streptococcus, group A: Secondary | ICD-10-CM | POA: Diagnosis not present

## 2022-08-19 DIAGNOSIS — C61 Malignant neoplasm of prostate: Secondary | ICD-10-CM | POA: Diagnosis not present

## 2022-08-19 DIAGNOSIS — T8453XA Infection and inflammatory reaction due to internal right knee prosthesis, initial encounter: Secondary | ICD-10-CM | POA: Diagnosis not present

## 2022-08-19 DIAGNOSIS — I480 Paroxysmal atrial fibrillation: Secondary | ICD-10-CM | POA: Diagnosis not present

## 2022-08-19 DIAGNOSIS — B181 Chronic viral hepatitis B without delta-agent: Secondary | ICD-10-CM | POA: Diagnosis not present

## 2022-08-19 DIAGNOSIS — I10 Essential (primary) hypertension: Secondary | ICD-10-CM | POA: Diagnosis not present

## 2022-08-19 DIAGNOSIS — N400 Enlarged prostate without lower urinary tract symptoms: Secondary | ICD-10-CM | POA: Diagnosis not present

## 2022-08-19 DIAGNOSIS — E860 Dehydration: Secondary | ICD-10-CM | POA: Diagnosis not present

## 2022-08-19 LAB — LIVER FIBROSIS, FIBROTEST-ACTITEST
ALT: 22 U/L (ref 9–46)
Alpha-2-Macroglobulin: 142 mg/dL (ref 106–279)
Apolipoprotein A1: 123 mg/dL (ref 94–176)
Bilirubin: 0.7 mg/dL (ref 0.2–1.2)
Fibrosis Score: 0.31
GGT: 37 U/L (ref 3–70)
Haptoglobin: 241 mg/dL — ABNORMAL HIGH (ref 43–212)
Necroinflammat ACT Score: 0.09
Reference ID: 4892493

## 2022-08-19 LAB — HEPATIC FUNCTION PANEL
AST: 18 U/L (ref 10–35)
Albumin: 3.4 g/dL — ABNORMAL LOW (ref 3.6–5.1)
Globulin: 2.5 g/dL (calc) (ref 1.9–3.7)
Total Bilirubin: 0.7 mg/dL (ref 0.2–1.2)
Total Protein: 5.9 g/dL — ABNORMAL LOW (ref 6.1–8.1)

## 2022-08-19 LAB — HEPATITIS B SURFACE ANTIGEN: Hepatitis B Surface Ag: NONREACTIVE

## 2022-08-19 LAB — HEPATITIS B SURFACE ANTIBODY,QUALITATIVE: Hep B S Ab: NONREACTIVE

## 2022-08-19 LAB — HEPATITIS B E ANTIGEN: Hep B E Ag: NONREACTIVE

## 2022-08-19 LAB — HEPATITIS B CORE ANTIBODY, TOTAL: Hep B Core Total Ab: NONREACTIVE

## 2022-08-20 DIAGNOSIS — T8450XA Infection and inflammatory reaction due to unspecified internal joint prosthesis, initial encounter: Secondary | ICD-10-CM | POA: Diagnosis not present

## 2022-08-21 DIAGNOSIS — T8450XA Infection and inflammatory reaction due to unspecified internal joint prosthesis, initial encounter: Secondary | ICD-10-CM | POA: Diagnosis not present

## 2022-08-22 DIAGNOSIS — I480 Paroxysmal atrial fibrillation: Secondary | ICD-10-CM | POA: Diagnosis not present

## 2022-08-22 DIAGNOSIS — J189 Pneumonia, unspecified organism: Secondary | ICD-10-CM | POA: Diagnosis not present

## 2022-08-22 DIAGNOSIS — C61 Malignant neoplasm of prostate: Secondary | ICD-10-CM | POA: Diagnosis not present

## 2022-08-22 DIAGNOSIS — N400 Enlarged prostate without lower urinary tract symptoms: Secondary | ICD-10-CM | POA: Diagnosis not present

## 2022-08-22 DIAGNOSIS — B181 Chronic viral hepatitis B without delta-agent: Secondary | ICD-10-CM | POA: Diagnosis not present

## 2022-08-22 DIAGNOSIS — Z452 Encounter for adjustment and management of vascular access device: Secondary | ICD-10-CM | POA: Diagnosis not present

## 2022-08-22 DIAGNOSIS — I10 Essential (primary) hypertension: Secondary | ICD-10-CM | POA: Diagnosis not present

## 2022-08-22 DIAGNOSIS — A400 Sepsis due to streptococcus, group A: Secondary | ICD-10-CM | POA: Diagnosis not present

## 2022-08-22 DIAGNOSIS — T8453XA Infection and inflammatory reaction due to internal right knee prosthesis, initial encounter: Secondary | ICD-10-CM | POA: Diagnosis not present

## 2022-08-22 DIAGNOSIS — E860 Dehydration: Secondary | ICD-10-CM | POA: Diagnosis not present

## 2022-08-25 DIAGNOSIS — B950 Streptococcus, group A, as the cause of diseases classified elsewhere: Secondary | ICD-10-CM | POA: Diagnosis not present

## 2022-08-25 DIAGNOSIS — Z452 Encounter for adjustment and management of vascular access device: Secondary | ICD-10-CM | POA: Diagnosis not present

## 2022-08-25 DIAGNOSIS — A400 Sepsis due to streptococcus, group A: Secondary | ICD-10-CM | POA: Diagnosis not present

## 2022-08-25 DIAGNOSIS — R634 Abnormal weight loss: Secondary | ICD-10-CM | POA: Diagnosis not present

## 2022-08-25 DIAGNOSIS — T8453XA Infection and inflammatory reaction due to internal right knee prosthesis, initial encounter: Secondary | ICD-10-CM | POA: Diagnosis not present

## 2022-08-25 DIAGNOSIS — C61 Malignant neoplasm of prostate: Secondary | ICD-10-CM | POA: Diagnosis not present

## 2022-08-25 DIAGNOSIS — Z9989 Dependence on other enabling machines and devices: Secondary | ICD-10-CM | POA: Diagnosis not present

## 2022-08-25 DIAGNOSIS — E860 Dehydration: Secondary | ICD-10-CM | POA: Diagnosis not present

## 2022-08-25 DIAGNOSIS — I10 Essential (primary) hypertension: Secondary | ICD-10-CM | POA: Diagnosis not present

## 2022-08-25 DIAGNOSIS — J189 Pneumonia, unspecified organism: Secondary | ICD-10-CM | POA: Diagnosis not present

## 2022-08-25 DIAGNOSIS — N400 Enlarged prostate without lower urinary tract symptoms: Secondary | ICD-10-CM | POA: Diagnosis not present

## 2022-08-25 DIAGNOSIS — I480 Paroxysmal atrial fibrillation: Secondary | ICD-10-CM | POA: Diagnosis not present

## 2022-08-25 DIAGNOSIS — B181 Chronic viral hepatitis B without delta-agent: Secondary | ICD-10-CM | POA: Diagnosis not present

## 2022-08-26 DIAGNOSIS — I480 Paroxysmal atrial fibrillation: Secondary | ICD-10-CM | POA: Diagnosis not present

## 2022-08-26 DIAGNOSIS — T8453XA Infection and inflammatory reaction due to internal right knee prosthesis, initial encounter: Secondary | ICD-10-CM | POA: Diagnosis not present

## 2022-08-26 DIAGNOSIS — C61 Malignant neoplasm of prostate: Secondary | ICD-10-CM | POA: Diagnosis not present

## 2022-08-26 DIAGNOSIS — E860 Dehydration: Secondary | ICD-10-CM | POA: Diagnosis not present

## 2022-08-26 DIAGNOSIS — A400 Sepsis due to streptococcus, group A: Secondary | ICD-10-CM | POA: Diagnosis not present

## 2022-08-26 DIAGNOSIS — B181 Chronic viral hepatitis B without delta-agent: Secondary | ICD-10-CM | POA: Diagnosis not present

## 2022-08-26 DIAGNOSIS — J189 Pneumonia, unspecified organism: Secondary | ICD-10-CM | POA: Diagnosis not present

## 2022-08-26 DIAGNOSIS — Z452 Encounter for adjustment and management of vascular access device: Secondary | ICD-10-CM | POA: Diagnosis not present

## 2022-08-26 DIAGNOSIS — I10 Essential (primary) hypertension: Secondary | ICD-10-CM | POA: Diagnosis not present

## 2022-08-26 DIAGNOSIS — N400 Enlarged prostate without lower urinary tract symptoms: Secondary | ICD-10-CM | POA: Diagnosis not present

## 2022-08-28 DIAGNOSIS — I10 Essential (primary) hypertension: Secondary | ICD-10-CM | POA: Diagnosis not present

## 2022-08-28 DIAGNOSIS — B181 Chronic viral hepatitis B without delta-agent: Secondary | ICD-10-CM | POA: Diagnosis not present

## 2022-08-28 DIAGNOSIS — Z452 Encounter for adjustment and management of vascular access device: Secondary | ICD-10-CM | POA: Diagnosis not present

## 2022-08-28 DIAGNOSIS — I480 Paroxysmal atrial fibrillation: Secondary | ICD-10-CM | POA: Diagnosis not present

## 2022-08-28 DIAGNOSIS — C61 Malignant neoplasm of prostate: Secondary | ICD-10-CM | POA: Diagnosis not present

## 2022-08-28 DIAGNOSIS — A400 Sepsis due to streptococcus, group A: Secondary | ICD-10-CM | POA: Diagnosis not present

## 2022-08-28 DIAGNOSIS — T8453XA Infection and inflammatory reaction due to internal right knee prosthesis, initial encounter: Secondary | ICD-10-CM | POA: Diagnosis not present

## 2022-08-28 DIAGNOSIS — E860 Dehydration: Secondary | ICD-10-CM | POA: Diagnosis not present

## 2022-08-28 DIAGNOSIS — N400 Enlarged prostate without lower urinary tract symptoms: Secondary | ICD-10-CM | POA: Diagnosis not present

## 2022-08-28 DIAGNOSIS — J189 Pneumonia, unspecified organism: Secondary | ICD-10-CM | POA: Diagnosis not present

## 2022-08-28 DIAGNOSIS — T8450XA Infection and inflammatory reaction due to unspecified internal joint prosthesis, initial encounter: Secondary | ICD-10-CM | POA: Diagnosis not present

## 2022-08-29 ENCOUNTER — Other Ambulatory Visit: Payer: Self-pay

## 2022-08-29 ENCOUNTER — Telehealth: Payer: Self-pay

## 2022-08-29 ENCOUNTER — Encounter: Payer: Self-pay | Admitting: Internal Medicine

## 2022-08-29 ENCOUNTER — Ambulatory Visit: Payer: Medicare HMO | Admitting: Internal Medicine

## 2022-08-29 VITALS — BP 131/78 | HR 65 | Resp 16 | Ht 69.0 in | Wt 230.0 lb

## 2022-08-29 DIAGNOSIS — T8453XD Infection and inflammatory reaction due to internal right knee prosthesis, subsequent encounter: Secondary | ICD-10-CM | POA: Diagnosis not present

## 2022-08-29 DIAGNOSIS — B950 Streptococcus, group A, as the cause of diseases classified elsewhere: Secondary | ICD-10-CM

## 2022-08-29 DIAGNOSIS — T8450XD Infection and inflammatory reaction due to unspecified internal joint prosthesis, subsequent encounter: Secondary | ICD-10-CM

## 2022-08-29 MED ORDER — CEFADROXIL 500 MG PO CAPS: 1000.0000 mg | ORAL_CAPSULE | Freq: Two times a day (BID) | ORAL | 11 refills | Status: DC

## 2022-08-29 NOTE — Progress Notes (Signed)
[facility]  CHIEF COMPLAINT:    Follow up for knee infection  SUBJECTIVE:    Caleb Barton is a 82 y.o. male with PMHx as below who presents to the clinic for knee infection.   Patient was admitted at [facility] 3/28 - 07/28/22 after presenting with sepsis and Group A strep bacteremia with right knee PJI.  Status post DAIR with orthopedic surgery 07/20/22 with OR cultures that isolated GAS as well.  He was discharged on IV penicillin via PICC line through 08/31/22 to complete 6 weeks of IV therapy.  He was discharged to SNF and reports doing well.  He is planning to go home on Thursday.    Additionally, as part of his admission patient was found to have elevated LFTs and found to have positive hepatitis B surface Ag.  He reports no prior hepatitis B history.  LFTs in March 2023 were normalized.   Please see A&P for the details of today's visit and status of the patient's medical problems.   ------------------ 08/29/22 id clinic f/u Doing well Intermittent pain right knee 2/10 at most Doesn't like iv med No n/v/diarrhea No f/c No rash Hb a little low but no blood per rectum or exertional dyspnea  Patient's Medications  New Prescriptions   No medications on file  Previous Medications   ASCORBIC ACID (VITAMIN C) 1000 MG TABLET    Take 1,000 mg by mouth daily.   ATORVASTATIN (LIPITOR) 40 MG TABLET    Take 40 mg by mouth at bedtime.   DORZOLAMIDE-TIMOLOL (COSOPT) 22.3-6.8 MG/ML OPHTHALMIC SOLUTION    Place 1 drop into both eyes 2 (two) times daily.    HYDROCHLOROTHIAZIDE (HYDRODIURIL) 25 MG TABLET    Take 37.5 mg by mouth at bedtime.   LATANOPROST (XALATAN) 0.005 % OPHTHALMIC SOLUTION    Place 1 drop into both eyes at bedtime.    LOSARTAN (COZAAR) 50 MG TABLET    TAKE ONE AND ONE-HALF TABLET BY MOUTH DAILY   METHOCARBAMOL (ROBAXIN) 500 MG TABLET    Take 500 mg by mouth See admin instructions. Take 500 mg by mouth every 4-6 hours as needed for  muscle spasms   MULTIPLE VITAMINS-MINERALS (CENTRUM SILVER 50+MEN) TABS    Take 1 tablet by mouth at bedtime.   OXYCODONE (OXY IR/ROXICODONE) 5 MG IMMEDIATE RELEASE TABLET    Take 1 tablet (5 mg total) by mouth every 4 (four) hours as needed for moderate pain.   OXYCODONE (OXY IR/ROXICODONE) 5 MG IMMEDIATE RELEASE TABLET    Take 1 tablet (5 mg total) by mouth every 4 (four) hours as needed for moderate pain.   PENICILLIN G IVPB    Inject 24 Million Units into the vein daily. Administer as continuous infusion Indication:  Group A strep bacteremia/R knee PJI First Dose: Yes Last Day of Therapy:  08/31/22 Labs - Once weekly:  CBC/D and BMP, Labs - Every other week:  ESR and CRP Method of administration: Elastomeric (Continuous infusion) Method of administration may be changed at the discretion of home infusion pharmacist based upon assessment of the patient and/or caregiver's ability to self-administer the medication ordered.   POLYETHYLENE GLYCOL (MIRALAX / GLYCOLAX) 17 G PACKET    Take 17 g by mouth daily as needed for mild constipation.   RIVAROXABAN (XARELTO) 20 MG TABS TABLET    Take 1 tablet (20 mg total) by mouth daily with supper.   TAMSULOSIN (FLOMAX) 0.4 MG CAPS CAPSULE  Take 0.4 mg by mouth at bedtime.   TYLENOL 500 MG TABLET    Take 500-1,000 mg by mouth every 6 (six) hours as needed for mild pain or headache.  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)    TIA 15 yrs ago    Social History   Tobacco Use   Smoking status: Former     Packs/day: 1.00    Years: 20.00    Additional pack years: 0.00    Total pack years: 20.00    Types: Cigarettes   Smokeless tobacco: Never   Tobacco comments:    quit 35 yrs ago  Vaping Use   Vaping Use: Never used  Substance Use Topics   Alcohol use: Yes    Comment: social   Drug use: No    Family History  Problem Relation Age of Onset   Stroke Mother    Breast cancer Mother    Bladder Cancer Brother    Breast cancer Other    Prostate cancer Neg Hx    Pancreatic cancer Neg Hx     Allergies  Allergen Reactions   Lisinopril Cough   Sulfa Antibiotics Rash    Review of Systems  All other systems reviewed and are negative.    OBJECTIVE:    There were no vitals filed for this visit.  There is no height or weight on file to calculate BMI.  Physical Exam Constitutional:      Appearance: Normal appearance.  Eyes:     Extraocular Movements: Extraocular movements intact.     Conjunctiva/sclera: Conjunctivae normal.  Pulmonary:     Effort: Pulmonary effort is normal. No respiratory distress.  Abdominal:     General: There is no distension.     Palpations: Abdomen is soft.  Musculoskeletal:        General: Normal range of motion.     Cervical back: Normal range of motion and neck supple.  Skin:    General: Skin is warm and dry.  Neurological:     General: No focal deficit present.     Mental Status: He is alert and oriented to person, place, and time.  Psychiatric:        Mood and Affect: Mood normal.        Behavior: Behavior normal.    Central line: rue picc site no erythema/purulence; removed today   Labs and Microbiology:    Latest Ref Rng & Units 07/23/2022    4:29 AM 07/22/2022    4:35 AM 07/21/2022    4:15 AM  CBC  WBC 4.0 - 10.5 K/uL 6.4  7.8  11.0   Hemoglobin 13.0 - 17.0 g/dL 32.4  40.1  02.7   Hematocrit 39.0 - 52.0 % 37.7  36.3  35.5   Platelets 150 - 400 K/uL 217  160  127       Latest Ref Rng & Units 08/12/2022    4:47  PM 07/23/2022    4:29  AM 07/22/2022    4:35 AM  CMP  Glucose 70 - 99 mg/dL  454  098   BUN 8 - 23 mg/dL  17  23   Creatinine 1.19 - 1.24 mg/dL  1.47  8.29   Sodium 562 - 145 mmol/L  137  137   Potassium 3.5 - 5.1 mmol/L  3.5  3.6   Chloride 98 - 111 mmol/L  97  97   CO2 22 - 32 mmol/L  32  33   Calcium 8.9 - 10.3 mg/dL  8.1  8.3   Total Protein 6.1 - 8.1 g/dL 5.9  5.9  5.6   Total Bilirubin 0.2 - 1.2 mg/dL 0.7  1.6  1.3   Alkaline Phos 38 - 126 U/L  125  107   AST 10 - 35 U/L 18  61  73   ALT 9 - 46 U/L 9 - 46 U/L 22    20  101  97      No results found for this or any previous visit (from the past 240 hour(s)).    ASSESSMENT & PLAN:    No problem-specific Assessment & Plan notes found for this encounter.   No orders of the defined types were placed in this encounter.    S/p dair for GAS bsi/prosthetic right knee S/p 6 weeks pcn g iv  5/6 esr normal; crp pending; cr 0.7; cbc 4/8.4/211   It is reasonable to transition to cefadroxil today for chronic suppression or at least 6-12 months; tx dose 1000 mg bid for 3 months then potentially 500 mg bid there after if the knee looks good  Picc removed today    Providence Lanius for Infectious Disease [facility] 08/29/2022, 10:03 AM

## 2022-08-29 NOTE — Patient Instructions (Signed)
Please start cefadroxil 1000 mg twice a day, starting today    See me in 6 weeks    In 3 months potentially we can go down to 500 mg twice a day with cefadroxil

## 2022-08-29 NOTE — Telephone Encounter (Signed)
Kendal Hymen and [facility] Team that Picc Line is pulled.

## 2022-09-01 DIAGNOSIS — N400 Enlarged prostate without lower urinary tract symptoms: Secondary | ICD-10-CM | POA: Diagnosis not present

## 2022-09-01 DIAGNOSIS — T8453XA Infection and inflammatory reaction due to internal right knee prosthesis, initial encounter: Secondary | ICD-10-CM | POA: Diagnosis not present

## 2022-09-01 DIAGNOSIS — Z452 Encounter for adjustment and management of vascular access device: Secondary | ICD-10-CM | POA: Diagnosis not present

## 2022-09-01 DIAGNOSIS — I10 Essential (primary) hypertension: Secondary | ICD-10-CM | POA: Diagnosis not present

## 2022-09-01 DIAGNOSIS — Z6833 Body mass index (BMI) 33.0-33.9, adult: Secondary | ICD-10-CM | POA: Diagnosis not present

## 2022-09-01 DIAGNOSIS — C61 Malignant neoplasm of prostate: Secondary | ICD-10-CM | POA: Diagnosis not present

## 2022-09-01 DIAGNOSIS — B181 Chronic viral hepatitis B without delta-agent: Secondary | ICD-10-CM | POA: Diagnosis not present

## 2022-09-01 DIAGNOSIS — A400 Sepsis due to streptococcus, group A: Secondary | ICD-10-CM | POA: Diagnosis not present

## 2022-09-01 DIAGNOSIS — I480 Paroxysmal atrial fibrillation: Secondary | ICD-10-CM | POA: Diagnosis not present

## 2022-09-01 DIAGNOSIS — M47816 Spondylosis without myelopathy or radiculopathy, lumbar region: Secondary | ICD-10-CM | POA: Diagnosis not present

## 2022-09-01 DIAGNOSIS — E860 Dehydration: Secondary | ICD-10-CM | POA: Diagnosis not present

## 2022-09-01 DIAGNOSIS — J189 Pneumonia, unspecified organism: Secondary | ICD-10-CM | POA: Diagnosis not present

## 2022-09-02 DIAGNOSIS — E860 Dehydration: Secondary | ICD-10-CM | POA: Diagnosis not present

## 2022-09-02 DIAGNOSIS — T8453XD Infection and inflammatory reaction due to internal right knee prosthesis, subsequent encounter: Secondary | ICD-10-CM | POA: Diagnosis not present

## 2022-09-02 DIAGNOSIS — C61 Malignant neoplasm of prostate: Secondary | ICD-10-CM | POA: Diagnosis not present

## 2022-09-02 DIAGNOSIS — T8453XA Infection and inflammatory reaction due to internal right knee prosthesis, initial encounter: Secondary | ICD-10-CM | POA: Diagnosis not present

## 2022-09-02 DIAGNOSIS — I480 Paroxysmal atrial fibrillation: Secondary | ICD-10-CM | POA: Diagnosis not present

## 2022-09-02 DIAGNOSIS — Z452 Encounter for adjustment and management of vascular access device: Secondary | ICD-10-CM | POA: Diagnosis not present

## 2022-09-02 DIAGNOSIS — I10 Essential (primary) hypertension: Secondary | ICD-10-CM | POA: Diagnosis not present

## 2022-09-02 DIAGNOSIS — J189 Pneumonia, unspecified organism: Secondary | ICD-10-CM | POA: Diagnosis not present

## 2022-09-02 DIAGNOSIS — A400 Sepsis due to streptococcus, group A: Secondary | ICD-10-CM | POA: Diagnosis not present

## 2022-09-02 DIAGNOSIS — N400 Enlarged prostate without lower urinary tract symptoms: Secondary | ICD-10-CM | POA: Diagnosis not present

## 2022-09-02 DIAGNOSIS — B181 Chronic viral hepatitis B without delta-agent: Secondary | ICD-10-CM | POA: Diagnosis not present

## 2022-09-05 ENCOUNTER — Other Ambulatory Visit: Payer: Self-pay | Admitting: Cardiology

## 2022-09-05 DIAGNOSIS — I10 Essential (primary) hypertension: Secondary | ICD-10-CM | POA: Diagnosis not present

## 2022-09-05 DIAGNOSIS — J189 Pneumonia, unspecified organism: Secondary | ICD-10-CM | POA: Diagnosis not present

## 2022-09-05 DIAGNOSIS — T8453XA Infection and inflammatory reaction due to internal right knee prosthesis, initial encounter: Secondary | ICD-10-CM | POA: Diagnosis not present

## 2022-09-05 DIAGNOSIS — Z452 Encounter for adjustment and management of vascular access device: Secondary | ICD-10-CM | POA: Diagnosis not present

## 2022-09-05 DIAGNOSIS — I480 Paroxysmal atrial fibrillation: Secondary | ICD-10-CM | POA: Diagnosis not present

## 2022-09-05 DIAGNOSIS — C61 Malignant neoplasm of prostate: Secondary | ICD-10-CM | POA: Diagnosis not present

## 2022-09-05 DIAGNOSIS — B181 Chronic viral hepatitis B without delta-agent: Secondary | ICD-10-CM | POA: Diagnosis not present

## 2022-09-05 DIAGNOSIS — A400 Sepsis due to streptococcus, group A: Secondary | ICD-10-CM | POA: Diagnosis not present

## 2022-09-05 DIAGNOSIS — E860 Dehydration: Secondary | ICD-10-CM | POA: Diagnosis not present

## 2022-09-05 DIAGNOSIS — N400 Enlarged prostate without lower urinary tract symptoms: Secondary | ICD-10-CM | POA: Diagnosis not present

## 2022-09-05 MED ORDER — RIVAROXABAN 20 MG PO TABS: 20.0000 mg | ORAL_TABLET | Freq: Every day | ORAL | 6 refills | Status: DC

## 2022-09-05 NOTE — Telephone Encounter (Signed)
Prescription refill request for Xarelto received.  Indication:afib Last office visit:2/24 Weight:104.3  kg Age:82 Scr:0.6  4/24 CrCl:147.28  ml/min  Prescription refilled

## 2022-09-05 NOTE — Telephone Encounter (Signed)
*  STAT* If patient is at the pharmacy, call can be transferred to refill team.   1. Which medications need to be refilled? (please list name of each medication and dose if known) rivaroxaban (XARELTO) 20 MG TABS tablet   2. Which pharmacy/location (including street and city if local pharmacy) is medication to be sent to?  [id number] - [place], [place] - [address]    3. Do they need a 30 day or 90 day supply? 30 day

## 2022-09-09 DIAGNOSIS — J219 Acute bronchiolitis, unspecified: Secondary | ICD-10-CM | POA: Diagnosis not present

## 2022-09-09 DIAGNOSIS — R062 Wheezing: Secondary | ICD-10-CM | POA: Diagnosis not present

## 2022-09-11 DIAGNOSIS — T8453XA Infection and inflammatory reaction due to internal right knee prosthesis, initial encounter: Secondary | ICD-10-CM | POA: Diagnosis not present

## 2022-09-11 DIAGNOSIS — J189 Pneumonia, unspecified organism: Secondary | ICD-10-CM | POA: Diagnosis not present

## 2022-09-11 DIAGNOSIS — N400 Enlarged prostate without lower urinary tract symptoms: Secondary | ICD-10-CM | POA: Diagnosis not present

## 2022-09-11 DIAGNOSIS — I480 Paroxysmal atrial fibrillation: Secondary | ICD-10-CM | POA: Diagnosis not present

## 2022-09-11 DIAGNOSIS — Z452 Encounter for adjustment and management of vascular access device: Secondary | ICD-10-CM | POA: Diagnosis not present

## 2022-09-11 DIAGNOSIS — B181 Chronic viral hepatitis B without delta-agent: Secondary | ICD-10-CM | POA: Diagnosis not present

## 2022-09-11 DIAGNOSIS — A400 Sepsis due to streptococcus, group A: Secondary | ICD-10-CM | POA: Diagnosis not present

## 2022-09-11 DIAGNOSIS — E860 Dehydration: Secondary | ICD-10-CM | POA: Diagnosis not present

## 2022-09-11 DIAGNOSIS — C61 Malignant neoplasm of prostate: Secondary | ICD-10-CM | POA: Diagnosis not present

## 2022-09-11 DIAGNOSIS — I10 Essential (primary) hypertension: Secondary | ICD-10-CM | POA: Diagnosis not present

## 2022-09-12 DIAGNOSIS — Z452 Encounter for adjustment and management of vascular access device: Secondary | ICD-10-CM | POA: Diagnosis not present

## 2022-09-12 DIAGNOSIS — B181 Chronic viral hepatitis B without delta-agent: Secondary | ICD-10-CM | POA: Diagnosis not present

## 2022-09-12 DIAGNOSIS — I10 Essential (primary) hypertension: Secondary | ICD-10-CM | POA: Diagnosis not present

## 2022-09-12 DIAGNOSIS — E860 Dehydration: Secondary | ICD-10-CM | POA: Diagnosis not present

## 2022-09-12 DIAGNOSIS — N400 Enlarged prostate without lower urinary tract symptoms: Secondary | ICD-10-CM | POA: Diagnosis not present

## 2022-09-12 DIAGNOSIS — I480 Paroxysmal atrial fibrillation: Secondary | ICD-10-CM | POA: Diagnosis not present

## 2022-09-12 DIAGNOSIS — T8453XA Infection and inflammatory reaction due to internal right knee prosthesis, initial encounter: Secondary | ICD-10-CM | POA: Diagnosis not present

## 2022-09-12 DIAGNOSIS — C61 Malignant neoplasm of prostate: Secondary | ICD-10-CM | POA: Diagnosis not present

## 2022-09-12 DIAGNOSIS — A400 Sepsis due to streptococcus, group A: Secondary | ICD-10-CM | POA: Diagnosis not present

## 2022-09-12 DIAGNOSIS — J189 Pneumonia, unspecified organism: Secondary | ICD-10-CM | POA: Diagnosis not present

## 2022-09-14 DIAGNOSIS — I10 Essential (primary) hypertension: Secondary | ICD-10-CM | POA: Diagnosis not present

## 2022-09-14 DIAGNOSIS — C61 Malignant neoplasm of prostate: Secondary | ICD-10-CM | POA: Diagnosis not present

## 2022-09-14 DIAGNOSIS — Z7901 Long term (current) use of anticoagulants: Secondary | ICD-10-CM | POA: Diagnosis not present

## 2022-09-14 DIAGNOSIS — N400 Enlarged prostate without lower urinary tract symptoms: Secondary | ICD-10-CM | POA: Diagnosis not present

## 2022-09-14 DIAGNOSIS — Z452 Encounter for adjustment and management of vascular access device: Secondary | ICD-10-CM | POA: Diagnosis not present

## 2022-09-14 DIAGNOSIS — J189 Pneumonia, unspecified organism: Secondary | ICD-10-CM | POA: Diagnosis not present

## 2022-09-14 DIAGNOSIS — E860 Dehydration: Secondary | ICD-10-CM | POA: Diagnosis not present

## 2022-09-14 DIAGNOSIS — B181 Chronic viral hepatitis B without delta-agent: Secondary | ICD-10-CM | POA: Diagnosis not present

## 2022-09-14 DIAGNOSIS — I480 Paroxysmal atrial fibrillation: Secondary | ICD-10-CM | POA: Diagnosis not present

## 2022-09-14 DIAGNOSIS — Z87891 Personal history of nicotine dependence: Secondary | ICD-10-CM | POA: Diagnosis not present

## 2022-09-14 DIAGNOSIS — A400 Sepsis due to streptococcus, group A: Secondary | ICD-10-CM | POA: Diagnosis not present

## 2022-09-14 DIAGNOSIS — T8453XA Infection and inflammatory reaction due to internal right knee prosthesis, initial encounter: Secondary | ICD-10-CM | POA: Diagnosis not present

## 2022-09-16 ENCOUNTER — Ambulatory Visit
Admission: RE | Admit: 2022-09-16 | Discharge: 2022-09-16 | Disposition: A | Discharge status: Home / Self Care | Payer: Medicare HMO | Source: Ambulatory Visit | Attending: Family Medicine | Admitting: Family Medicine

## 2022-09-16 ENCOUNTER — Other Ambulatory Visit: Payer: Self-pay | Admitting: Family Medicine

## 2022-09-16 DIAGNOSIS — R053 Chronic cough: Secondary | ICD-10-CM

## 2022-09-16 DIAGNOSIS — R059 Cough, unspecified: Secondary | ICD-10-CM | POA: Diagnosis not present

## 2022-09-17 ENCOUNTER — Encounter: Payer: Self-pay | Admitting: Internal Medicine

## 2022-09-18 DIAGNOSIS — Z7901 Long term (current) use of anticoagulants: Secondary | ICD-10-CM | POA: Diagnosis not present

## 2022-09-18 DIAGNOSIS — E860 Dehydration: Secondary | ICD-10-CM | POA: Diagnosis not present

## 2022-09-18 DIAGNOSIS — Z87891 Personal history of nicotine dependence: Secondary | ICD-10-CM | POA: Diagnosis not present

## 2022-09-18 DIAGNOSIS — C61 Malignant neoplasm of prostate: Secondary | ICD-10-CM | POA: Diagnosis not present

## 2022-09-18 DIAGNOSIS — I10 Essential (primary) hypertension: Secondary | ICD-10-CM | POA: Diagnosis not present

## 2022-09-18 DIAGNOSIS — B181 Chronic viral hepatitis B without delta-agent: Secondary | ICD-10-CM | POA: Diagnosis not present

## 2022-09-18 DIAGNOSIS — N400 Enlarged prostate without lower urinary tract symptoms: Secondary | ICD-10-CM | POA: Diagnosis not present

## 2022-09-18 DIAGNOSIS — T8453XA Infection and inflammatory reaction due to internal right knee prosthesis, initial encounter: Secondary | ICD-10-CM | POA: Diagnosis not present

## 2022-09-18 DIAGNOSIS — I480 Paroxysmal atrial fibrillation: Secondary | ICD-10-CM | POA: Diagnosis not present

## 2022-09-18 DIAGNOSIS — J189 Pneumonia, unspecified organism: Secondary | ICD-10-CM | POA: Diagnosis not present

## 2022-09-18 DIAGNOSIS — Z452 Encounter for adjustment and management of vascular access device: Secondary | ICD-10-CM | POA: Diagnosis not present

## 2022-09-18 DIAGNOSIS — A400 Sepsis due to streptococcus, group A: Secondary | ICD-10-CM | POA: Diagnosis not present

## 2022-09-23 DIAGNOSIS — Z7901 Long term (current) use of anticoagulants: Secondary | ICD-10-CM | POA: Diagnosis not present

## 2022-09-23 DIAGNOSIS — I480 Paroxysmal atrial fibrillation: Secondary | ICD-10-CM | POA: Diagnosis not present

## 2022-09-23 DIAGNOSIS — T8453XA Infection and inflammatory reaction due to internal right knee prosthesis, initial encounter: Secondary | ICD-10-CM | POA: Diagnosis not present

## 2022-09-23 DIAGNOSIS — I10 Essential (primary) hypertension: Secondary | ICD-10-CM | POA: Diagnosis not present

## 2022-09-23 DIAGNOSIS — Z452 Encounter for adjustment and management of vascular access device: Secondary | ICD-10-CM | POA: Diagnosis not present

## 2022-09-23 DIAGNOSIS — A400 Sepsis due to streptococcus, group A: Secondary | ICD-10-CM | POA: Diagnosis not present

## 2022-09-23 DIAGNOSIS — J189 Pneumonia, unspecified organism: Secondary | ICD-10-CM | POA: Diagnosis not present

## 2022-09-23 DIAGNOSIS — Z87891 Personal history of nicotine dependence: Secondary | ICD-10-CM | POA: Diagnosis not present

## 2022-09-23 DIAGNOSIS — E860 Dehydration: Secondary | ICD-10-CM | POA: Diagnosis not present

## 2022-09-23 DIAGNOSIS — C61 Malignant neoplasm of prostate: Secondary | ICD-10-CM | POA: Diagnosis not present

## 2022-09-23 DIAGNOSIS — B181 Chronic viral hepatitis B without delta-agent: Secondary | ICD-10-CM | POA: Diagnosis not present

## 2022-09-23 DIAGNOSIS — N400 Enlarged prostate without lower urinary tract symptoms: Secondary | ICD-10-CM | POA: Diagnosis not present

## 2022-09-24 DIAGNOSIS — Z8546 Personal history of malignant neoplasm of prostate: Secondary | ICD-10-CM | POA: Diagnosis not present

## 2022-10-01 DIAGNOSIS — N403 Nodular prostate with lower urinary tract symptoms: Secondary | ICD-10-CM | POA: Diagnosis not present

## 2022-10-01 DIAGNOSIS — R351 Nocturia: Secondary | ICD-10-CM | POA: Diagnosis not present

## 2022-10-01 DIAGNOSIS — Z8546 Personal history of malignant neoplasm of prostate: Secondary | ICD-10-CM | POA: Diagnosis not present

## 2022-10-02 ENCOUNTER — Encounter: Payer: Self-pay | Admitting: Internal Medicine

## 2022-10-02 ENCOUNTER — Other Ambulatory Visit: Payer: Self-pay

## 2022-10-02 ENCOUNTER — Ambulatory Visit: Payer: Medicare HMO | Admitting: Internal Medicine

## 2022-10-02 VITALS — BP 138/78 | HR 78 | Resp 16 | Ht 69.0 in | Wt 236.0 lb

## 2022-10-02 DIAGNOSIS — T8459XD Infection and inflammatory reaction due to other internal joint prosthesis, subsequent encounter: Secondary | ICD-10-CM | POA: Diagnosis not present

## 2022-10-02 DIAGNOSIS — T8453XD Infection and inflammatory reaction due to internal right knee prosthesis, subsequent encounter: Secondary | ICD-10-CM

## 2022-10-02 DIAGNOSIS — Z96659 Presence of unspecified artificial knee joint: Secondary | ICD-10-CM

## 2022-10-02 LAB — CBC
Hemoglobin: 13.1 g/dL — ABNORMAL LOW (ref 13.2–17.1)
MCHC: 32.8 g/dL (ref 32.0–36.0)
MPV: 10.2 fL (ref 7.5–12.5)
Platelets: 215 10*3/uL (ref 140–400)
RDW: 13.4 % (ref 11.0–15.0)
WBC: 4.1 10*3/uL (ref 3.8–10.8)

## 2022-10-02 NOTE — Patient Instructions (Signed)
You are doing good. Continue pt/ot to strengthen your knee  Definitely let your orthopedics doc know if the knee continues to get progressively more painful   I do feel it's looking pretty good today though   Will trend blood tests, starting today --> if we have normal inflammation markers over the next 2-3 months will decrease your antibiotics dose to 1 tablet twice a day Keep at 2 twice a day now  See me in around 6 weeks

## 2022-10-02 NOTE — Progress Notes (Signed)
[facility]  CHIEF COMPLAINT:    Follow up for knee infection  SUBJECTIVE:    Caleb Barton is a 82 y.o. male with PMHx as below who presents to the clinic for knee infection.   Patient was admitted at 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  Status post DAIR with orthopedic surgery 07/20/22 with OR cultures that isolated GAS as well.  He was discharged on IV penicillin via PICC line through 08/31/22 to complete 6 weeks of IV therapy.  He was discharged to SNF and reports doing well.  He is planning to go home on Thursday.    Additionally, as part of his admission patient was found to have elevated LFTs and found to have positive hepatitis B surface Ag.  He reports no prior hepatitis B history.  LFTs in March 2023 were normalized.   Please see A&P for the details of today's visit and status of the patient's medical problems.   ------------------ 08/29/22 id clinic f/u Doing well Intermittent pain right knee 2/10 at most Doesn't like iv med No n/v/diarrhea No f/c No rash Hb a little low but no blood per rectum or exertional dyspnea  ------------- 09/29/22 id clinic f/u See below in h&P for current utd history/progress  Patient's Medications  New Prescriptions   No medications on file  Previous Medications   ASCORBIC ACID (VITAMIN C) 1000 MG TABLET    Take 1,000 mg by mouth daily.   ATORVASTATIN (LIPITOR) 40 MG TABLET    Take 40 mg by mouth at bedtime.   CEFADROXIL (DURICEF) 500 MG CAPSULE    Take 2 capsules (1,000 mg total) by mouth 2 (two) times daily.   DORZOLAMIDE-TIMOLOL (COSOPT) 22.3-6.8 MG/ML OPHTHALMIC SOLUTION    Place 1 drop into both eyes 2 (two) times daily.    HYDROCHLOROTHIAZIDE (HYDRODIURIL) 25 MG TABLET    Take 37.5 mg by mouth at bedtime.   LATANOPROST (XALATAN) 0.005 % OPHTHALMIC SOLUTION    Place 1 drop into both eyes at bedtime.    LOSARTAN (COZAAR) 50 MG  TABLET    TAKE ONE AND ONE-HALF TABLET BY MOUTH DAILY   METHOCARBAMOL (ROBAXIN) 500 MG TABLET    Take 500 mg by mouth See admin instructions. Take 500 mg by mouth every 4-6 hours as needed for muscle spasms   MULTIPLE VITAMINS-MINERALS (CENTRUM SILVER 50+MEN) TABS    Take 1 tablet by mouth at bedtime.   OXYCODONE (OXY IR/ROXICODONE) 5 MG IMMEDIATE RELEASE TABLET    Take 1 tablet (5 mg total) by mouth every 4 (four) hours as needed for moderate pain.   OXYCODONE (OXY IR/ROXICODONE) 5 MG IMMEDIATE RELEASE TABLET    Take 1 tablet (5 mg total) by mouth every 4 (four) hours as needed for moderate pain.   POLYETHYLENE GLYCOL (MIRALAX / GLYCOLAX) 17 G PACKET    Take 17 g by mouth daily as needed for mild constipation.   RIVAROXABAN (XARELTO) 20 MG TABS TABLET    Take 1 tablet (20 mg total) by mouth daily with supper.   TAMSULOSIN (FLOMAX) 0.4 MG CAPS CAPSULE    Take 0.4 mg by mouth at bedtime.   TYLENOL 500 MG TABLET    Take 500-1,000 mg by mouth every 6 (six) hours as needed for mild pain or headache.  Modified Medications   No medications on file  Discontinued Medications   No medications on  file      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   Dyspnea    Dysrhythmia    RBBB   Hypercholesteremia    takes Lipitor daily and niacin daily   Hypertension    takes HCTZ daily and losartan daily   Prostate cancer (HCC)    Right knee DJD    Sleep apnea    cpap;sleep study done at home;request report from dr.gates   Stroke [facility])    TIA 15 yrs ago    Social History   Tobacco Use   Smoking status: Former    Packs/day: 1.00    Years: 20.00    Additional pack years: 0.00    Total pack years: 20.00    Types: Cigarettes    Passive exposure: Never   Smokeless tobacco: Never   Tobacco comments:    quit 35 yrs ago  Vaping Use   Vaping Use: Never used  Substance Use Topics   Alcohol use: Yes    Comment: social   Drug use: No     Family History  Problem Relation Age of Onset   Stroke Mother    Breast cancer Mother    Bladder Cancer Brother    Breast cancer Other    Prostate cancer Neg Hx    Pancreatic cancer Neg Hx     Allergies  Allergen Reactions   Lisinopril Cough   Sulfa Antibiotics Rash    Review of Systems  All other systems reviewed and are negative.    OBJECTIVE:    Vitals:   10/02/22 0934  Resp: 16  Weight: 236 lb (107 kg)  Height: 5\' 9"  (1.753 m)    Body mass index is 34.85 kg/m.  Physical exam: General/constitutional: no distress, pleasant HEENT: Normocephalic, PER, Conj Clear, EOMI, Oropharynx clear Neck supple CV: rrr no mrg Lungs: clear to auscultation, normal respiratory effort Abd: Soft, Nontender Ext: no edema Skin: No Rash Neuro: nonfocal MSK: bilateral knee replacement scar -- right knee no effusion and full rom but slightly warmer to touch     Labs and Microbiology:    Latest Ref Rng & Units 07/23/2022    4:29 AM 07/22/2022    4:35 AM 07/21/2022    4:15 AM  CBC  WBC 4.0 - 10.5 K/uL 6.4  7.8  11.0   Hemoglobin 13.0 - 17.0 g/dL 09.8  11.9  14.7   Hematocrit 39.0 - 52.0 % 37.7  36.3  35.5   Platelets 150 - 400 K/uL 217  160  127       Latest Ref Rng & Units 08/12/2022    4:47 PM 07/23/2022    4:29 AM 07/22/2022    4:35 AM  CMP  Glucose 70 - 99 mg/dL  829  562   BUN 8 - 23 mg/dL  17  23   Creatinine 1.30 - 1.24 mg/dL  8.65  7.84   Sodium 696 - 145 mmol/L  137  137   Potassium 3.5 - 5.1 mmol/L  3.5  3.6   Chloride 98 - 111 mmol/L  97  97   CO2 22 - 32 mmol/L  32  33   Calcium 8.9 - 10.3 mg/dL  8.1  8.3   Total Protein 6.1 - 8.1 g/dL 5.9  5.9  5.6   Total Bilirubin 0.2 - 1.2 mg/dL 0.7  1.6  1.3   Alkaline Phos 38 - 126 U/L  125  107   AST 10 - 35 U/L 18  61  73   ALT 9 - 46 U/L 9 - 46 U/L 22    20  101  97      No results found for this or any previous visit (from the past 240 hour(s)).    ASSESSMENT & PLAN:      S/p dair for GAS  bsi/prosthetic right knee S/p 6 weeks pcn g iv  08/25/22 esr normal; crp pending; cr 0.7; cbc 4/8.4/211   It is reasonable to transition to cefadroxil today for chronic suppression or at least 6-12 months; tx dose 1000 mg bid for 3 months then potentially 500 mg bid there after if the knee looks good  Picc removed today   ------------- 10/02/22 id clinic assessment Right knee bothers him getting in and out of truck/up-down stairs. He is doing pt/ot. Hurts when he goes up and down stairs can be up to 5/10. It's been the same since last visit He hasn't seen ortho in follow since post-op follow up Tolerating cefadroxil 1000 -- no side effect Labs today  If crp is normal x3, will decrease dose to 500 mg twice a day with cefadroxil See me mid to end of July    Verline Kong T [facility] 10/02/2022, 9:39 AM

## 2022-10-03 LAB — COMPLETE METABOLIC PANEL WITH GFR
AG Ratio: 2.2 (calc) (ref 1.0–2.5)
ALT: 20 U/L (ref 9–46)
AST: 20 U/L (ref 10–35)
Albumin: 4.1 g/dL (ref 3.6–5.1)
Alkaline phosphatase (APISO): 87 U/L (ref 35–144)
BUN: 12 mg/dL (ref 7–25)
CO2: 30 mmol/L (ref 20–32)
Calcium: 9.3 mg/dL (ref 8.6–10.3)
Chloride: 104 mmol/L (ref 98–110)
Creat: 0.72 mg/dL (ref 0.70–1.28)
Globulin: 1.9 g/dL (calc) (ref 1.9–3.7)
Glucose, Bld: 89 mg/dL (ref 65–99)
Potassium: 3.9 mmol/L (ref 3.5–5.3)
Sodium: 141 mmol/L (ref 135–146)
Total Bilirubin: 1 mg/dL (ref 0.2–1.2)
Total Protein: 6 g/dL — ABNORMAL LOW (ref 6.1–8.1)
eGFR: 93 mL/min/{1.73_m2} (ref >=60)

## 2022-10-03 LAB — CBC
HCT: 39.9 % (ref 38.5–50.0)
MCH: 31 pg (ref 27.0–33.0)
MCV: 94.3 fL (ref 80.0–100.0)
RBC: 4.23 10*6/uL (ref 4.20–5.80)

## 2022-10-03 LAB — C-REACTIVE PROTEIN: CRP: <3 mg/L (ref <8.0)

## 2022-10-13 DIAGNOSIS — H401131 Primary open-angle glaucoma, bilateral, mild stage: Secondary | ICD-10-CM | POA: Diagnosis not present

## 2022-10-13 DIAGNOSIS — H0288B Meibomian gland dysfunction left eye, upper and lower eyelids: Secondary | ICD-10-CM | POA: Diagnosis not present

## 2022-10-13 DIAGNOSIS — H0288A Meibomian gland dysfunction right eye, upper and lower eyelids: Secondary | ICD-10-CM | POA: Diagnosis not present

## 2022-10-14 DIAGNOSIS — T8453XD Infection and inflammatory reaction due to internal right knee prosthesis, subsequent encounter: Secondary | ICD-10-CM | POA: Diagnosis not present

## 2022-10-14 NOTE — Progress Notes (Unsigned)
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  Last seen by Dr. Swaziland on 03/03/2022, at which time the decision was made to continue anticoagulation as he is in permanent atrial fibrillation although he is asymptomatic.  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  Past Medical History    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   Dyspnea    Dysrhythmia    RBBB   Hypercholesteremia    takes Lipitor daily and niacin daily   Hypertension    takes HCTZ daily and losartan daily   Prostate cancer (HCC)    Right knee DJD    Sleep apnea    cpap;sleep study done at home;request report from dr.gates   Stroke [facility])    TIA 15 yrs ago   Past Surgical History:  Procedure Laterality Date   CARDIOVERSION N/A 10/14/2016   Procedure: CARDIOVERSION;  Surgeon: Laurey Morale, MD;  Location: [facility] ENDOSCOPY;  Service: Cardiovascular;  Laterality: N/A;   cataracts both eyes     ioc lens   COLONOSCOPY     with polyps   COLONOSCOPY  08/20/2011   Procedure: COLONOSCOPY;  Surgeon: Vertell Novak., MD;  Location: [facility] ENDOSCOPY;  Service: Endoscopy;  Laterality: N/A;   COLONOSCOPY WITH PROPOFOL N/A 12/17/2016   Procedure: COLONOSCOPY WITH PROPOFOL;  Surgeon: Carman Ching, MD;  Location: [facility] ENDOSCOPY;  Service: Endoscopy;  Laterality: N/A;   COLONOSCOPY WITH PROPOFOL N/A 07/08/2022   Procedure: COLONOSCOPY WITH PROPOFOL;  Surgeon: Kathi Der, MD;  Location: [facility] ENDOSCOPY;   Service: Gastroenterology;  Laterality: N/A;   EYE SURGERY     laser bilateral eyes   I & D KNEE WITH POLY EXCHANGE Right 07/20/2022   Procedure: IRRIGATION AND DEBRIDEMENT RIGHT KNEE WITH POLY EXCHANGE;  Surgeon: Joen Laura, MD;  Location: [facility] ORS;  Service: Orthopedics;  Laterality: Right;   KNEE ARTHROSCOPY  x 2 right knee   LEFT HEART CATH AND CORONARY ANGIOGRAPHY N/A 07/29/2019   Procedure: LEFT HEART CATH AND CORONARY ANGIOGRAPHY;  Surgeon: Iran Ouch, MD;  Location: [facility] INVASIVE CV LAB;  Service: Cardiovascular;  Laterality: N/A;   LUMBAR LAMINECTOMY/DECOMPRESSION MICRODISCECTOMY Right 09/09/2012   Procedure: LUMBAR LAMINECTOMY/DECOMPRESSION MICRODISCECTOMY 1 LEVEL;  Surgeon: Tia Alert, MD;  Location: [facility] NEURO ORS;  Service: Neurosurgery;  Laterality: Right;  LUMBAR LAMINECTOMY/DECOMPRESSION MICRODISCECTOMY 1 LEVEL   POLYPECTOMY  07/08/2022   Procedure: POLYPECTOMY;  Surgeon: Kathi Der, MD;  Location: [facility] ENDOSCOPY;  Service: Gastroenterology;;   TONSILLECTOMY  yrs ago   TOTAL KNEE ARTHROPLASTY  02/09/2012   Procedure: TOTAL KNEE ARTHROPLASTY;  Surgeon: Nilda Simmer, MD;  Location: [facility] OR;  Service: Orthopedics;  Laterality: Right;   TOTAL KNEE ARTHROPLASTY Left 07/01/2021   Procedure: TOTAL KNEE ARTHROPLASTY;  Surgeon: Joen Laura, MD;  Location: [facility] ORS;  Service: Orthopedics;  Laterality: Left;    Allergies  Allergies  Allergen Reactions   Lisinopril Cough   Sulfa Antibiotics Rash    History of Present Illness  [name] comes today with complaints of lower extremity edema.  He states that it has been worsening over the last few months, he denies any significant weight gain, but admits to salt intake.  He has been taking the HCTZ which has not been helpful in keeping his edema down.  The patient also has been seen by his dentist who did some films of his jaw and did pick up some calcium in his vessels which made [place] aware of by a form which she  sent with the patient.  The patient denies any chest pain, dizziness, shortness of breath, or fatigue.  He has been medically compliant.  Home Medications    Current Outpatient Medications  Medication Sig Dispense Refill   Ascorbic Acid (VITAMIN C) 1000 MG tablet Take 1,000 mg by mouth daily.     atorvastatin (LIPITOR) 40 MG tablet Take 40 mg by mouth at bedtime.     cefadroxil (DURICEF) 500 MG capsule Take 2 capsules (1,000 mg total) by mouth 2 (two) times daily. 120 capsule 11   dorzolamide-timolol (COSOPT) 22.3-6.8 MG/ML ophthalmic solution Place 1 drop into both eyes 2 (two) times daily.      furosemide (LASIX) 20 MG tablet Take 1 tablet (20 mg total) by mouth as needed for fluid or edema. 30 tablet 3   latanoprost (XALATAN) 0.005 % ophthalmic solution Place 1 drop into both eyes at bedtime.      losartan (COZAAR) 50 MG tablet TAKE ONE AND ONE-HALF TABLET BY MOUTH DAILY (Patient taking differently: Take 50 mg by mouth at bedtime.) 145 tablet 3   methocarbamol (ROBAXIN) 500 MG tablet Take 500 mg by mouth See admin instructions. Take 500 mg by mouth every 4-6 hours as needed for muscle spasms     Multiple Vitamins-Minerals (CENTRUM SILVER 50+MEN) TABS Take 1 tablet by mouth at bedtime.     polyethylene glycol (MIRALAX / GLYCOLAX) 17 g packet Take 17 g by mouth daily as needed for mild constipation. 14 each 0   rivaroxaban (XARELTO) 20 MG TABS tablet Take 1 tablet (20 mg total) by mouth daily with supper. 30 tablet 6   tamsulosin (FLOMAX) 0.4 MG CAPS capsule Take 0.4 mg by mouth at bedtime.     TYLENOL 500 MG tablet Take 500-1,000 mg by mouth every 6 (six) hours as needed for mild pain or headache.     oxyCODONE (OXY IR/ROXICODONE) 5 MG immediate release tablet Take 1 tablet (5 mg total) by mouth every 4 (four) hours as needed for moderate pain. 7 tablet 0   oxyCODONE (OXY IR/ROXICODONE) 5 MG immediate release tablet Take 1 tablet (5 mg total) by mouth every 4 (four) hours as needed for moderate  pain. 7 tablet 0   No current facility-administered medications for this visit.     Family History    Family History  Problem Relation Age of Onset   Stroke Mother    Breast cancer Mother    Bladder Cancer Brother    Breast cancer Other    Prostate cancer Neg Hx    Pancreatic cancer Neg Hx    He indicated that his mother is deceased. He indicated that his father is deceased. He indicated that the status of his brother is unknown. He indicated that his maternal grandmother is deceased. He indicated that his maternal grandfather is deceased. He indicated that his paternal grandmother is deceased. He indicated that his paternal grandfather is deceased. He indicated that the status of his neg hx is unknown. He  indicated that his other is alive.  Social History    Social History   Socioeconomic History   Marital status: Married    Spouse name: Claris Che   Number of children: 2   Years of education: Not on file   Highest education level: Not on file  Occupational History    Comment: retired  Tobacco Use   Smoking status: Former    Packs/day: 1.00    Years: 20.00    Additional pack years: 0.00    Total pack years: 20.00    Types: Cigarettes    Passive exposure: Never   Smokeless tobacco: Never   Tobacco comments:    quit 35 yrs ago  Vaping Use   Vaping Use: Never used  Substance and Sexual Activity   Alcohol use: Yes    Comment: social   Drug use: No   Sexual activity: Yes  Other Topics Concern   Not on file  Social History Narrative   Not on file   Social Determinants of Health   Financial Resource Strain: Not on file  Food Insecurity: No Food Insecurity (07/18/2022)   Hunger Vital Sign    Worried About Running Out of Food in the Last Year: Never true    Ran Out of Food in the Last Year: Never true  Transportation Needs: No Transportation Needs (07/18/2022)   PRAPARE - Administrator, Civil Service (Medical): No    Lack of Transportation (Non-Medical):  No  Physical Activity: Not on file  Stress: Not on file  Social Connections: Not on file  Intimate Partner Violence: Not At Risk (07/18/2022)   Humiliation, Afraid, Rape, and Kick questionnaire    Fear of Current or Ex-Partner: No    Emotionally Abused: No    Physically Abused: No    Sexually Abused: No     Review of Systems    General:  No chills, fever, night sweats or weight changes.  Cardiovascular:  No chest pain, dyspnea on exertion, positive for bilateral pretibial and ankle edema, orthopnea, palpitations, paroxysmal nocturnal dyspnea. Dermatological: No rash, lesions/masses Respiratory: No cough, dyspnea Urologic: No hematuria, dysuria Abdominal:   No nausea, vomiting, diarrhea, bright red blood per rectum, melena, or hematemesis Neurologic:  No visual changes, wkns, changes in mental status. All other systems reviewed and are otherwise negative except as noted above.     Physical Exam    VS:  BP 120/70 (BP Location: Left Arm, Patient Position: Sitting, Cuff Size: Normal)   Pulse 87   Ht 5\' 9"  (1.753 m)   Wt 238 lb 3.2 oz (108 kg)   SpO2 97%   BMI 35.18 kg/m  , BMI Body mass index is 35.18 kg/m.     GEN: Well nourished, well developed, in no acute distress. HEENT: normal. Neck: Supple, no JVD, carotid bruits, or masses. Cardiac: IRRR, no murmurs, rubs, or gallops. No clubbing, cyanosis, 1-2+ pitting edema pretibial  Radials/DP/PT 2+ and equal bilaterally.  Respiratory:  Respirations regular and unlabored, clear to auscultation bilaterally. GI: Soft, nontender, nondistended, BS + x 4. MS: no deformity or atrophy. Skin: warm and dry, no rash. Neuro:  Strength and sensation are intact. Psych: Normal affect.  Accessory Clinical Findings      Lab Results  Component Value Date   WBC 4.1 10/02/2022   HGB 13.1 (L) 10/02/2022   HCT 39.9 10/02/2022   MCV 94.3 10/02/2022   PLT 215 10/02/2022   Lab Results  Component Value Date   CREATININE 0.72  10/02/2022    BUN 12 10/02/2022   NA 141 10/02/2022   K 3.9 10/02/2022   CL 104 10/02/2022   CO2 30 10/02/2022   Lab Results  Component Value Date   ALT 20 10/02/2022   AST 20 10/02/2022   GGT 37 08/12/2022   ALKPHOS 125 07/23/2022   BILITOT 1.0 10/02/2022   No results found for: "CHOL", "HDL", "LDLCALC", "LDLDIRECT", "TRIG", "CHOLHDL"  No results found for: "HGBA1C"  Review of Prior Studies  Echocardiogram 07/18/2022 1. Left ventricular ejection fraction, by estimation, is 60 to 65%. The  left ventricle has normal function. The left ventricle has no regional  wall motion abnormalities. Left ventricular diastolic parameters are  indeterminate.   2. Right ventricular systolic function is normal. The right ventricular  size is normal. Tricuspid regurgitation signal is inadequate for assessing  PA pressure.   3. No evidence of mitral valve regurgitation.   4. The aortic valve was not well visualized. Aortic valve regurgitation  is not visualized.   5. Aneurysm of the aortic root, measuring 42 mm.   6. The inferior vena cava is dilated in size with <50% respiratory  variability, suggesting right atrial pressure of 15 mmHg.      LHC 07/29/2019  Mid RCA lesion is 30% stenosed. Ost RCA lesion is 20% stenosed. Ost LAD lesion is 20% stenosed. Ramus lesion is 60% stenosed.   1.  Mild nonobstructive coronary artery disease.  Borderline stenosis in ostial ramus. 2.  Left ventricular angiography was not performed.  EF was normal by echo. 3.  Mildly elevated left ventricular end-diastolic pressure at 15 mmHg.  Assessment & Plan   1.  Lower extremity edema: May be related to salt intake and atrial fibrillation.  No evidence of CHF at this time.  The patient has been on HCTZ but this is not working well concerning helping him with lower extremity edema and diuresis.  I will stop the HCTZ and change him to Lasix 20 mg daily.  He will take it every day for the next 3 days to help with the edema, and then  take it again as needed only.  If he finds that he is continuing to have to take the Lasix more often, he will need to take it daily.  Will also started on potassium replacement.  A BMET will be ordered to be sent followed in 1 week.  He is advised on a low-sodium diet.  2.  Hypertension: Blood pressure is well-controlled currently.  He remains on losartan 75 mg daily.  May need to reduce to 50 mg if blood pressure becomes lower on Lasix.  Will follow-up and evaluate on next office visit.  3.  Hypercholesterolemia: Remains on atorvastatin 40 mg daily.  Will need to have fasting lipids and LFTs on next office visit if not completed by PCP.  Goal of LDL 100.  4.  Permanent atrial fibrillation: CHA2DS2-VASc score 2.  Heart rate is currently controlled without AV nodal blocking agents at this time.  Continue Xarelto 20 mg daily.  5.  Abnormal x-ray of lower jaw: Completed by dentist noticing calcium at the C3-C4 anterior segment of his neck.  I will go ahead and order carotid ultrasound to be thorough.  I do not hear any carotid bruits, but the patient is concerned and therefore we will evaluate further.  May need to be seen by orthopedics to evaluate for cervical disc disease is normal.          Signed,  Bettey Mare. Liborio Nixon, ANP, AACC   10/16/2022 5:08 PM      Office [phone] Fax [phone]  Notice: This dictation was prepared with Dragon dictation along with smaller phrase technology. Any transcriptional errors that result from this process are unintentional and may not be corrected upon review.

## 2022-10-16 ENCOUNTER — Encounter: Payer: Self-pay | Admitting: Adult Health

## 2022-10-16 ENCOUNTER — Ambulatory Visit: Payer: Medicare HMO | Attending: Adult Health | Admitting: Adult Health

## 2022-10-16 VITALS — BP 120/70 | HR 87 | Ht 69.0 in | Wt 238.2 lb

## 2022-10-16 DIAGNOSIS — I10 Essential (primary) hypertension: Secondary | ICD-10-CM

## 2022-10-16 MED ORDER — FUROSEMIDE 20 MG PO TABS: 20.0000 mg | ORAL_TABLET | ORAL | 3 refills | Status: DC | PRN

## 2022-10-16 NOTE — Patient Instructions (Signed)
Medication Instructions:  Stop hydrochlorothiazide. Start Lasix 20 mg ( Take 1 Tablet for (3) Three Days, Then Take As Needed for Swelling and Edema)> *If you need a refill on your cardiac medications before your next appointment, please call your pharmacy*   Lab Work: BMEt : To Be Done 1 week. If you have labs (blood work) drawn today and your tests are completely normal, you will receive your results only by: MyChart Message (if you have MyChart) OR A paper copy in the mail If you have any lab test that is abnormal or we need to change your treatment, we will call you to review the results.   Testing/Procedures: [address], [address]. Your physician has requested that you have a carotid duplex. This test is an ultrasound of the carotid arteries in your neck. It looks at blood flow through these arteries that supply the brain with blood. Allow one hour for this exam. There are no restrictions or special instructions.    Follow-Up: At [facility], you and your health needs are our priority.  As part of our continuing mission to provide you with exceptional heart care, we have created designated Provider Care Teams.  These Care Teams include your primary Cardiologist (physician) and Advanced Practice Providers (APPs -  Physician Assistants and Nurse Practitioners) who all work together to provide you with the care you need, when you need it.  We recommend signing up for the patient portal called "MyChart".  Sign up information is provided on this After Visit Summary.  MyChart is used to connect with patients for Virtual Visits (Telemedicine).  Patients are able to view lab/test results, encounter notes, upcoming appointments, etc.  Non-urgent messages can be sent to your provider as well.   To learn more about what you can do with MyChart, go to [email or web address].    Your next appointment:   6 month(s)  Provider:   Joni Reining, DNP, ANP  or , Peter Swaziland,  MD

## 2022-10-27 ENCOUNTER — Ambulatory Visit (HOSPITAL_COMMUNITY)
Admission: RE | Admit: 2022-10-27 | Discharge: 2022-10-27 | Disposition: A | Discharge status: Home / Self Care | Payer: Medicare HMO | Source: Ambulatory Visit | Attending: Adult Health | Admitting: Adult Health

## 2022-10-27 ENCOUNTER — Telehealth: Payer: Self-pay

## 2022-10-27 DIAGNOSIS — I10 Essential (primary) hypertension: Secondary | ICD-10-CM | POA: Insufficient documentation

## 2022-10-27 LAB — BASIC METABOLIC PANEL
BUN/Creatinine Ratio: 11 (ref 10–24)
BUN: 9 mg/dL (ref 8–27)
CO2: 27 mmol/L (ref 20–29)
Calcium: 9.3 mg/dL (ref 8.6–10.2)
Chloride: 104 mmol/L (ref 96–106)
Creatinine, Ser: 0.79 mg/dL (ref 0.76–1.27)
Glucose: 81 mg/dL (ref 70–99)
Potassium: 4 mmol/L (ref 3.5–5.2)
Sodium: 145 mmol/L — ABNORMAL HIGH (ref 134–144)
eGFR: 90 mL/min/{1.73_m2} (ref >59)

## 2022-10-27 NOTE — Telephone Encounter (Addendum)
Called patient regarding results. Patient had understanding of results.----- Message from Jodelle Gross, NP sent at 10/27/2022  2:27 PM EDT ----- I have reviewed the carotid study  He has no blockages in his carotid arteries. This is a good report!  KL

## 2022-10-28 DIAGNOSIS — G4733 Obstructive sleep apnea (adult) (pediatric): Secondary | ICD-10-CM | POA: Diagnosis not present

## 2022-10-29 ENCOUNTER — Telehealth: Payer: Self-pay

## 2022-10-29 DIAGNOSIS — L821 Other seborrheic keratosis: Secondary | ICD-10-CM | POA: Diagnosis not present

## 2022-10-29 DIAGNOSIS — D225 Melanocytic nevi of trunk: Secondary | ICD-10-CM | POA: Diagnosis not present

## 2022-10-29 DIAGNOSIS — L812 Freckles: Secondary | ICD-10-CM | POA: Diagnosis not present

## 2022-10-29 DIAGNOSIS — D224 Melanocytic nevi of scalp and neck: Secondary | ICD-10-CM | POA: Diagnosis not present

## 2022-10-29 DIAGNOSIS — L570 Actinic keratosis: Secondary | ICD-10-CM | POA: Diagnosis not present

## 2022-10-29 DIAGNOSIS — Z85828 Personal history of other malignant neoplasm of skin: Secondary | ICD-10-CM | POA: Diagnosis not present

## 2022-10-29 DIAGNOSIS — D1801 Hemangioma of skin and subcutaneous tissue: Secondary | ICD-10-CM | POA: Diagnosis not present

## 2022-10-29 NOTE — Telephone Encounter (Addendum)
Results viewed by patient via MyChart.----- Message from Jodelle Gross, NP sent at 10/28/2022  2:24 PM EDT ----- I have reviewed labs. Essentially normal. Continue current regime.  KL

## 2022-10-31 DIAGNOSIS — G4733 Obstructive sleep apnea (adult) (pediatric): Secondary | ICD-10-CM | POA: Diagnosis not present

## 2022-11-06 ENCOUNTER — Other Ambulatory Visit: Payer: Self-pay

## 2022-11-06 ENCOUNTER — Encounter: Payer: Self-pay | Admitting: Internal Medicine

## 2022-11-06 ENCOUNTER — Ambulatory Visit: Payer: Medicare HMO | Admitting: Internal Medicine

## 2022-11-06 VITALS — BP 172/96 | HR 88 | Resp 16 | Ht 69.0 in | Wt 242.0 lb

## 2022-11-06 DIAGNOSIS — T8450XD Infection and inflammatory reaction due to unspecified internal joint prosthesis, subsequent encounter: Secondary | ICD-10-CM | POA: Diagnosis not present

## 2022-11-06 DIAGNOSIS — T8453XD Infection and inflammatory reaction due to internal right knee prosthesis, subsequent encounter: Secondary | ICD-10-CM | POA: Diagnosis not present

## 2022-11-06 NOTE — Progress Notes (Addendum)
[facility]  CHIEF COMPLAINT:    Follow up for knee infection  SUBJECTIVE:    Caleb Barton is a 82 y.o. male with PMHx as below who presents to the clinic for knee infection.   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  Status post DAIR with orthopedic surgery 07/20/22 with OR cultures that isolated GAS as well.  He was discharged on IV penicillin via PICC line through 08/31/22 to complete 6 weeks of IV therapy.  He was discharged to SNF and reports doing well.  He is planning to go home on Thursday.    Additionally, as part of his admission patient was found to have elevated LFTs and found to have positive hepatitis B surface Ag.  He reports no prior hepatitis B history.  LFTs in March 2023 were normalized.   Please see A&P for the details of today's visit and status of the patient's medical problems.   ------------------ 08/29/22 id clinic f/u Doing well Intermittent pain right knee 2/10 at most Doesn't like iv med No n/v/diarrhea No f/c No rash Hb a little low but no blood per rectum or exertional dyspnea  ------------- 11/06/22 See below in A&P for current utd history/progress  Patient's Medications  New Prescriptions   No medications on file  Previous Medications   ASCORBIC ACID (VITAMIN C) 1000 MG TABLET    Take 1,000 mg by mouth daily.   ATORVASTATIN (LIPITOR) 40 MG TABLET    Take 40 mg by mouth at bedtime.   CEFADROXIL (DURICEF) 500 MG CAPSULE    Take 2 capsules (1,000 mg total) by mouth 2 (two) times daily.   DORZOLAMIDE-TIMOLOL (COSOPT) 22.3-6.8 MG/ML OPHTHALMIC SOLUTION    Place 1 drop into both eyes 2 (two) times daily.    FUROSEMIDE (LASIX) 20 MG TABLET    Take 1 tablet (20 mg total) by mouth as needed for fluid or edema.   LATANOPROST (XALATAN) 0.005 % OPHTHALMIC SOLUTION    Place 1 drop into both eyes at bedtime.    LOSARTAN (COZAAR) 50 MG  TABLET    TAKE ONE AND ONE-HALF TABLET BY MOUTH DAILY   METHOCARBAMOL (ROBAXIN) 500 MG TABLET    Take 500 mg by mouth See admin instructions. Take 500 mg by mouth every 4-6 hours as needed for muscle spasms   MULTIPLE VITAMINS-MINERALS (CENTRUM SILVER 50+MEN) TABS    Take 1 tablet by mouth at bedtime.   OXYCODONE (OXY IR/ROXICODONE) 5 MG IMMEDIATE RELEASE TABLET    Take 1 tablet (5 mg total) by mouth every 4 (four) hours as needed for moderate pain.   OXYCODONE (OXY IR/ROXICODONE) 5 MG IMMEDIATE RELEASE TABLET    Take 1 tablet (5 mg total) by mouth every 4 (four) hours as needed for moderate pain.   POLYETHYLENE GLYCOL (MIRALAX / GLYCOLAX) 17 G PACKET    Take 17 g by mouth daily as needed for mild constipation.   RIVAROXABAN (XARELTO) 20 MG TABS TABLET    Take 1 tablet (20 mg total) by mouth daily with supper.   TAMSULOSIN (FLOMAX) 0.4 MG CAPS CAPSULE    Take 0.4 mg by mouth at bedtime.   TYLENOL 500 MG TABLET    Take 500-1,000 mg by mouth every 6 (six) hours as needed for mild pain or headache.  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   Dyspnea    Dysrhythmia    RBBB   Hypercholesteremia    takes Lipitor daily and niacin daily   Hypertension    takes HCTZ daily and losartan daily   Prostate cancer (HCC)    Right knee DJD    Sleep apnea    cpap;sleep study done at home;request report from dr.gates   Stroke [facility])    TIA 15 yrs ago    Social History   Tobacco Use   Smoking status: Former    Current packs/day: 1.00    Average packs/day: 1 pack/day for 20.0 years (20.0 ttl pk-yrs)    Types: Cigarettes    Passive exposure: Never   Smokeless tobacco: Never   Tobacco comments:    quit 35 yrs ago  Vaping Use   Vaping status: Never Used  Substance Use Topics   Alcohol use: Yes    Comment: social   Drug use: No     Family History  Problem Relation Age of Onset   Stroke Mother    Breast cancer Mother    Bladder Cancer Brother    Breast cancer Other    Prostate cancer Neg Hx    Pancreatic cancer Neg Hx     Allergies  Allergen Reactions   Lisinopril Cough   Sulfa Antibiotics Rash    Review of Systems  All other systems reviewed and are negative.    OBJECTIVE:    Vitals:   11/06/22 0847  BP: (!) 179/101  Pulse: 88  Resp: 16  SpO2: 95%  Weight: 242 lb (109.8 kg)  Height: 5\' 9"  (1.753 m)    Body mass index is 35.74 kg/m.  Physical exam: General/constitutional: no distress, pleasant HEENT: Normocephalic, PER, Conj Clear, EOMI, Oropharynx clear Neck supple CV: rrr no mrg Lungs: clear to auscultation, normal respiratory effort Abd: Soft, Nontender Ext: no edema Skin: No Rash Neuro: nonfocal MSK: bilateral knee replacement scar -- right knee no effusion and full rom but slightly warmer to touch     Labs and Microbiology:    Component Value Date/Time   CRP <3.0 10/02/2022 1000       Latest Ref Rng & Units 10/02/2022   10:00 AM 07/23/2022    4:29 AM 07/22/2022    4:35 AM  CBC  WBC 3.8 - 10.8 Thousand/uL 4.1  6.4  7.8   Hemoglobin 13.2 - 17.1 g/dL 78.2  95.6  21.3   Hematocrit 38.5 - 50.0 % 39.9  37.7  36.3   Platelets 140 - 400 Thousand/uL 215  217  160       Latest Ref Rng & Units 10/27/2022    9:45 AM 10/02/2022   10:00 AM 08/12/2022    4:47 PM  CMP  Glucose 70 - 99 mg/dL 81  89    BUN 8 - 27 mg/dL 9  12    Creatinine 0.86 - 1.27 mg/dL 5.78  4.69    Sodium 629 - 144 mmol/L 145  141    Potassium 3.5 - 5.2 mmol/L 4.0  3.9    Chloride 96 - 106 mmol/L 104  104    CO2 20 - 29 mmol/L 27  30    Calcium 8.6 - 10.2 mg/dL 9.3  9.3    Total Protein 6.1 - 8.1 g/dL  6.0  5.9   Total Bilirubin  0.2 - 1.2 mg/dL  1.0  0.7   AST 10 - 35 U/L  20  18   ALT 9 - 46 U/L  20  22    20       No results found for this or any previous visit (from the past 240  hour(s)).    ASSESSMENT & PLAN:      S/p dair for GAS bsi/prosthetic right knee S/p 6 weeks pcn g iv On cefadroxil chronic suppression since 08/31/22   Planned at least 6-12 months; tx dose 1000 mg bid for 3 months then potentially 500 mg bid there after if the knee looks good    ------------- 10/02/22 id clinic assessment Right knee bothers him getting in and out of truck/up-down stairs. He is doing pt/ot. Hurts when he goes up and down stairs can be up to 5/10. It's been the same since last visit He hasn't seen ortho in follow since post-op follow up Tolerating cefadroxil 1000 -- no side effect Labs today  If crp is normal x3, will decrease dose to 500 mg twice a day with cefadroxil See me mid to end of July  11/06/22 id clinic assessment Similar clinical status with the right knee as previous visit.  Tolerating cefadroxil 1 gram bid Crp normal 10/02/22 -- will get another crp today and if clinical status remains the same by another 6-8 weeks visit will transition to 500 mg bid cefadroxil for chronic suppression  Elevated bp. Advise monitoring at home and discussing with pcp if continue to be >140s/90s  Venous stasis. Said lasix doesn't seem to work any more. Advise him to try compression stocking and using lasix prn only for acute worsening. No hx chf. F/u pcp  [facility] 11/06/2022, 8:53 AM

## 2022-11-06 NOTE — Patient Instructions (Signed)
We are checking your crp today, by early September if clinical status remains the same and crp remains normal, will decrease your cefadroxil to 500 mg twice a day, and we can plan another 3 months at least of that prior to discussion potentially stopping antibiotics   See me again in early september

## 2022-11-07 LAB — COMPLETE METABOLIC PANEL WITH GFR
AG Ratio: 2.2 (calc) (ref 1.0–2.5)
ALT: 20 U/L (ref 9–46)
AST: 22 U/L (ref 10–35)
Albumin: 4.2 g/dL (ref 3.6–5.1)
Alkaline phosphatase (APISO): 85 U/L (ref 35–144)
BUN: 11 mg/dL (ref 7–25)
CO2: 26 mmol/L (ref 20–32)
Calcium: 9.3 mg/dL (ref 8.6–10.3)
Chloride: 109 mmol/L (ref 98–110)
Creat: 0.87 mg/dL (ref 0.70–1.22)
Globulin: 1.9 g/dL (calc) (ref 1.9–3.7)
Glucose, Bld: 78 mg/dL (ref 65–99)
Potassium: 3.8 mmol/L (ref 3.5–5.3)
Sodium: 142 mmol/L (ref 135–146)
Total Bilirubin: 1 mg/dL (ref 0.2–1.2)
Total Protein: 6.1 g/dL (ref 6.1–8.1)
eGFR: 87 mL/min/{1.73_m2} (ref >=60)

## 2022-11-07 LAB — CBC
HCT: 41.9 % (ref 38.5–50.0)
Hemoglobin: 13.6 g/dL (ref 13.2–17.1)
MCH: 31 pg (ref 27.0–33.0)
MCHC: 32.5 g/dL (ref 32.0–36.0)
MCV: 95.4 fL (ref 80.0–100.0)
MPV: 11.3 fL (ref 7.5–12.5)
Platelets: 179 10*3/uL (ref 140–400)
RBC: 4.39 10*6/uL (ref 4.20–5.80)
RDW: 13.5 % (ref 11.0–15.0)
WBC: 5.1 10*3/uL (ref 3.8–10.8)

## 2022-11-07 LAB — C-REACTIVE PROTEIN: CRP: <3 mg/L (ref <8.0)

## 2022-11-26 ENCOUNTER — Telehealth: Payer: Self-pay | Admitting: Cardiology

## 2022-11-26 NOTE — Progress Notes (Unsigned)
Cardiology Office Note:    Date:  11/27/2022   ID:  Caleb Barton, DOB 1940/04/25, MRN [id number]  PCP:  Shon Hale, MD   [facility] Providers Cardiologist:  Peter Swaziland, MD     Referring MD: Shon Hale, *   Chief Complaint  Patient presents with   Follow-up    HTN    History of Present Illness:    Caleb Barton is a 82 y.o. male with a hx of HTN, PAF diagnosed at colonoscopy s/p DCCV 10/14/16, echo unremarkable. Unfortunately, he converted back to rate controlled Afib after only one day - has not required rate controlling medications. Decision was made to leave him in Afib since he was rate controlled, asymptomatic, and tolerating anticoagulation. Heart monitor 03/2017 to evaluate bradycardia showed no significant symptomatic bradycardia or pauses. He has a history of normal coronaries on heart cath in 2007 (Dr. Katrinka Blazing) in response to abnormal myoview. Hx of LAFB/RBBB, HTN, OSA on CPAP, remote thalamic CVA in 2006, and HLD. I saw him in 2021 for dizziness and DOE.  Nuclear stress test revealed a large inferior defect leading to cardiac catheterization in 2021 that revealed nonobstructive CAD and an LVEDP of 15 mmHg.  He was last seen by Joni Reining, NP 10/16/2022 and reported worsening lower extremity edema in the setting of dietary indiscretion not improved with HCTZ.  She opted to stop HCTZ and started him on Lasix 20 mg x 3 days then as needed.  She did not think he was volume up due to heart failure.  BP was controlled at that visit on losartan 75 mg daily.  Dentist also sent films revealing calcium in the C3-C4 anterior segment of his neck.  Carotid ultrasound was ordered and showed no obstructive disease in his carotid arteries.  He called our office reporting elevated blood pressure and was placed on my schedule.  He reports SBP in the 170 range. Today 120/74. He is getting headaches when he has elevated BP. Headache is dull. No vision changes.      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   Dyspnea    Dysrhythmia    RBBB   Hypercholesteremia    takes Lipitor daily and niacin daily   Hypertension    takes HCTZ daily and losartan daily   Prostate cancer (HCC)    Right knee DJD    Sleep apnea    cpap;sleep study done at home;request report from dr.gates   Stroke [facility])    TIA 15 yrs ago    Past Surgical History:  Procedure Laterality Date   CARDIOVERSION N/A 10/14/2016   Procedure: CARDIOVERSION;  Surgeon: Laurey Morale, MD;  Location: [facility] ENDOSCOPY;  Service: Cardiovascular;  Laterality: N/A;   cataracts both eyes     ioc lens   COLONOSCOPY     with polyps   COLONOSCOPY  08/20/2011   Procedure: COLONOSCOPY;  Surgeon: Vertell Novak., MD;  Location: [facility] ENDOSCOPY;  Service: Endoscopy;  Laterality: N/A;   COLONOSCOPY WITH PROPOFOL N/A 12/17/2016   Procedure: COLONOSCOPY WITH PROPOFOL;  Surgeon: Carman Ching, MD;  Location: [facility] ENDOSCOPY;  Service: Endoscopy;  Laterality: N/A;   COLONOSCOPY WITH PROPOFOL N/A 07/08/2022   Procedure: COLONOSCOPY WITH PROPOFOL;  Surgeon: Kathi Der, MD;  Location: [facility] ENDOSCOPY;  Service: Gastroenterology;  Laterality: N/A;   EYE SURGERY     laser bilateral eyes   I & D  KNEE WITH POLY EXCHANGE Right 07/20/2022   Procedure: IRRIGATION AND DEBRIDEMENT RIGHT KNEE WITH POLY EXCHANGE;  Surgeon: Joen Laura, MD;  Location: [facility] ORS;  Service: Orthopedics;  Laterality: Right;   KNEE ARTHROSCOPY  x 2 right knee   LEFT HEART CATH AND CORONARY ANGIOGRAPHY N/A 07/29/2019   Procedure: LEFT HEART CATH AND CORONARY ANGIOGRAPHY;  Surgeon: Iran Ouch, MD;  Location: [facility] INVASIVE CV LAB;  Service: Cardiovascular;  Laterality: N/A;   LUMBAR LAMINECTOMY/DECOMPRESSION MICRODISCECTOMY Right 09/09/2012   Procedure: LUMBAR LAMINECTOMY/DECOMPRESSION MICRODISCECTOMY 1 LEVEL;  Surgeon: Tia Alert, MD;  Location: [facility] NEURO ORS;  Service:  Neurosurgery;  Laterality: Right;  LUMBAR LAMINECTOMY/DECOMPRESSION MICRODISCECTOMY 1 LEVEL   POLYPECTOMY  07/08/2022   Procedure: POLYPECTOMY;  Surgeon: Kathi Der, MD;  Location: [facility] ENDOSCOPY;  Service: Gastroenterology;;   TONSILLECTOMY  yrs ago   TOTAL KNEE ARTHROPLASTY  02/09/2012   Procedure: TOTAL KNEE ARTHROPLASTY;  Surgeon: Nilda Simmer, MD;  Location: [facility] OR;  Service: Orthopedics;  Laterality: Right;   TOTAL KNEE ARTHROPLASTY Left 07/01/2021   Procedure: TOTAL KNEE ARTHROPLASTY;  Surgeon: Joen Laura, MD;  Location: [facility] ORS;  Service: Orthopedics;  Laterality: Left;    Current Medications: Current Meds  Medication Sig   Ascorbic Acid (VITAMIN C) 1000 MG tablet Take 1,000 mg by mouth daily.   atorvastatin (LIPITOR) 40 MG tablet Take 40 mg by mouth at bedtime.   cefadroxil (DURICEF) 500 MG capsule Take 2 capsules (1,000 mg total) by mouth 2 (two) times daily.   dorzolamide-timolol (COSOPT) 22.3-6.8 MG/ML ophthalmic solution Place 1 drop into both eyes 2 (two) times daily.    furosemide (LASIX) 20 MG tablet Take 1 tablet (20 mg total) by mouth as needed for fluid or edema.   latanoprost (XALATAN) 0.005 % ophthalmic solution Place 1 drop into both eyes at bedtime.    methocarbamol (ROBAXIN) 500 MG tablet Take 500 mg by mouth See admin instructions. Take 500 mg by mouth every 4-6 hours as needed for muscle spasms   Multiple Vitamins-Minerals (CENTRUM SILVER 50+MEN) TABS Take 1 tablet by mouth at bedtime.   olmesartan (BENICAR) 40 MG tablet Take 1 tablet (40 mg total) by mouth daily.   oxyCODONE (OXY IR/ROXICODONE) 5 MG immediate release tablet Take 1 tablet (5 mg total) by mouth every 4 (four) hours as needed for moderate pain.   oxyCODONE (OXY IR/ROXICODONE) 5 MG immediate release tablet Take 1 tablet (5 mg total) by mouth every 4 (four) hours as needed for moderate pain.   polyethylene glycol (MIRALAX / GLYCOLAX) 17 g packet Take 17 g by mouth daily as needed for mild  constipation.   rivaroxaban (XARELTO) 20 MG TABS tablet Take 1 tablet (20 mg total) by mouth daily with supper.   tamsulosin (FLOMAX) 0.4 MG CAPS capsule Take 0.4 mg by mouth at bedtime.   TYLENOL 500 MG tablet Take 500-1,000 mg by mouth every 6 (six) hours as needed for mild pain or headache.   [DISCONTINUED] losartan (COZAAR) 50 MG tablet TAKE ONE AND ONE-HALF TABLET BY MOUTH DAILY (Patient taking differently: Take 50 mg by mouth at bedtime.)     Allergies:   Lisinopril and Sulfa antibiotics   Social History   Socioeconomic History   Marital status: Married    Spouse name: Claris Che   Number of children: 2   Years of education: Not on file   Highest education level: Not on file  Occupational History    Comment: retired  Tobacco Use  Smoking status: Former    Current packs/day: 1.00    Average packs/day: 1 pack/day for 20.0 years (20.0 ttl pk-yrs)    Types: Cigarettes    Passive exposure: Never   Smokeless tobacco: Never   Tobacco comments:    quit 35 yrs ago  Vaping Use   Vaping status: Never Used  Substance and Sexual Activity   Alcohol use: Yes    Comment: social   Drug use: No   Sexual activity: Yes  Other Topics Concern   Not on file  Social History Narrative   Not on file   Social Determinants of Health   Financial Resource Strain: Not on file  Food Insecurity: No Food Insecurity (07/18/2022)   Hunger Vital Sign    Worried About Running Out of Food in the Last Year: Never true    Ran Out of Food in the Last Year: Never true  Transportation Needs: No Transportation Needs (07/18/2022)   PRAPARE - Administrator, Civil Service (Medical): No    Lack of Transportation (Non-Medical): No  Physical Activity: Not on file  Stress: Not on file  Social Connections: Not on file     Family History: The patient's family history includes Bladder Cancer in his brother; Breast cancer in his mother and another family member; Stroke in his mother. There is no  history of Prostate cancer or Pancreatic cancer.  ROS:   Please see the history of present illness.     All other systems reviewed and are negative.  EKGs/Labs/Other Studies Reviewed:    The following studies were reviewed today:  Cardiac Studies & Procedures   CARDIAC CATHETERIZATION  CARDIAC CATHETERIZATION 07/29/2019  Narrative  Mid RCA lesion is 30% stenosed.  Ost RCA lesion is 20% stenosed.  Ost LAD lesion is 20% stenosed.  Ramus lesion is 60% stenosed.  1.  Mild nonobstructive coronary artery disease.  Borderline stenosis in ostial ramus. 2.  Left ventricular angiography was not performed.  EF was normal by echo. 3.  Mildly elevated left ventricular end-diastolic pressure at 15 mmHg.  Recommendations: Suspect false positive nuclear stress test. Continue medical therapy.  Findings Coronary Findings Diagnostic  Dominance: Right  Left Anterior Descending Ost LAD lesion is 20% stenosed.  Ramus Intermedius Ramus lesion is 60% stenosed.  Left Circumflex Vessel is angiographically normal.  Right Coronary Artery Vessel is large. Ost RCA lesion is 20% stenosed. Mid RCA lesion is 30% stenosed.  Right Posterior Descending Artery Vessel is angiographically normal.  Right Posterior Atrioventricular Artery Vessel is angiographically normal.  Intervention  No interventions have been documented.   STRESS TESTS  MYOCARDIAL PERFUSION IMAGING 07/19/2019  Narrative  The left ventricular ejection fraction is mildly decreased (45-54%).  Nuclear stress EF: 53%.  There was no ST segment deviation noted during stress.  Defect 1: There is a large defect of moderate severity present in the basal anteroseptal, basal inferoseptal, basal inferior, basal inferolateral, mid inferoseptal, mid inferior and apical inferior location.  This is an intermediate risk study.  No prior study for comparison.  EF low normal without specific wall motion abnormalities seen, but  there is a large fixed defect in the inferior/inferoseptal walls from base to apex, extending to anteroseptal/inferolateral areas at the base. This does not change significantly with stress imaging. There is no clear diaphragmatic attenuation to explain findings. There are notes re: abnormal myoview with normal cath in 2007, but I cannot see this study to compare. Intermediate risk given size of area, but  no ischemia seen.   ECHOCARDIOGRAM  ECHOCARDIOGRAM COMPLETE 07/18/2022  Narrative ECHOCARDIOGRAM REPORT    Patient Name:   ALEXIUS BOONSTRA Date of Exam: 07/18/2022 Medical Rec #:  [id number]      Height:       69.0 in Accession #:    [id number]     Weight:       242.0 lb Date of Birth:  August 07, 1940       BSA:          2.240 m Patient Age:    81 years       BP:           107/69 mmHg Patient Gender: M              HR:           103 bpm. Exam Location:  Inpatient  Procedure: 2D Echo, Color Doppler and Cardiac Doppler  Indications:    Bacteremia  History:        Patient has prior history of Echocardiogram examinations, most recent 06/30/2019. Stroke, Arrythmias:RBBB, Signs/Symptoms:Dyspnea; Risk Factors:Sleep Apnea and Hypertension. Cancer.  Sonographer:    Milda Smart Referring Phys: [id number] THOMPSON   Sonographer Comments: Technically difficult study due to poor echo windows. Image acquisition challenging due to patient body habitus and Image acquisition challenging due to respiratory motion. Patient unable to be positioned for optimal imaging. IMPRESSIONS   1. Left ventricular ejection fraction, by estimation, is 60 to 65%. The left ventricle has normal function. The left ventricle has no regional wall motion abnormalities. Left ventricular diastolic parameters are indeterminate. 2. Right ventricular systolic function is normal. The right ventricular size is normal. Tricuspid regurgitation signal is inadequate for assessing PA pressure. 3. No evidence of mitral valve  regurgitation. 4. The aortic valve was not well visualized. Aortic valve regurgitation is not visualized. 5. Aneurysm of the aortic root, measuring 42 mm. 6. The inferior vena cava is dilated in size with <50% respiratory variability, suggesting right atrial pressure of 15 mmHg.  Conclusion(s)/Recommendation(s): No large vegetations seen visualized. aortic valve not well visualized.  FINDINGS Left Ventricle: Left ventricular ejection fraction, by estimation, is 60 to 65%. The left ventricle has normal function. The left ventricle has no regional wall motion abnormalities. The left ventricular internal cavity size was normal in size. There is no left ventricular hypertrophy. Left ventricular diastolic parameters are indeterminate.  Right Ventricle: The right ventricular size is normal. Right ventricular systolic function is normal. Tricuspid regurgitation signal is inadequate for assessing PA pressure.  Left Atrium: Left atrial size was normal in size.  Right Atrium: Right atrial size was normal in size.  Pericardium: There is no evidence of pericardial effusion.  Mitral Valve: No evidence of mitral valve regurgitation. MV peak gradient, 7.7 mmHg. The mean mitral valve gradient is 4.0 mmHg.  Tricuspid Valve: Tricuspid valve regurgitation is not demonstrated.  Aortic Valve: The aortic valve was not well visualized. Aortic valve regurgitation is not visualized.  Pulmonic Valve: Pulmonic valve regurgitation is not visualized.  Aorta: There is an aneurysm involving the aortic root measuring 42 mm.  Venous: The inferior vena cava is dilated in size with less than 50% respiratory variability, suggesting right atrial pressure of 15 mmHg.  IAS/Shunts: The interatrial septum was not well visualized.   LEFT VENTRICLE PLAX 2D LVIDd:         5.70 cm      Diastology LVIDs:         4.50 cm  LV e' medial:    5.87 cm/s LV PW:         0.90 cm      LV E/e' medial:  22.0 LV IVS:        0.80 cm       LV e' lateral:   9.57 cm/s LVOT diam:     2.30 cm      LV E/e' lateral: 13.5 LV SV:         73 LV SV Index:   33 LVOT Area:     4.15 cm  LV Volumes (MOD) LV vol d, MOD A2C: 104.0 ml LV vol d, MOD A4C: 63.5 ml LV vol s, MOD A2C: 54.0 ml LV vol s, MOD A4C: 38.3 ml LV SV MOD A2C:     50.0 ml LV SV MOD A4C:     63.5 ml LV SV MOD BP:      37.3 ml  RIGHT VENTRICLE            IVC RV S prime:     9.57 cm/s  IVC diam: 2.50 cm TAPSE (M-mode): 0.9 cm  LEFT ATRIUM             Index        RIGHT ATRIUM           Index LA diam:        3.80 cm 1.70 cm/m   RA Area:     21.30 cm LA Vol (A2C):   75.7 ml 33.79 ml/m  RA Volume:   56.10 ml  25.04 ml/m LA Vol (A4C):   58.6 ml 26.16 ml/m LA Biplane Vol: 72.2 ml 32.23 ml/m AORTIC VALVE LVOT Vmax:   103.00 cm/s LVOT Vmean:  80.900 cm/s LVOT VTI:    0.176 m  AORTA Ao Root diam: 4.20 cm Ao Asc diam:  4.40 cm  MITRAL VALVE MV Area (PHT): 5.16 cm     SHUNTS MV Area VTI:   3.18 cm     Systemic VTI:  0.18 m MV Peak grad:  7.7 mmHg     Systemic Diam: 2.30 cm MV Mean grad:  4.0 mmHg MV Vmax:       1.39 m/s MV Vmean:      93.7 cm/s MV Decel Time: 147 msec MV E velocity: 129.00 cm/s  Carolan Clines Electronically signed by Carolan Clines Signature Date/Time: 07/18/2022/4:37:12 PM    Final                  Recent Labs: 07/22/2022: Magnesium 2.5 11/06/2022: ALT 20; BUN 11; Creat 0.87; Hemoglobin 13.6; Platelets 179; Potassium 3.8; Sodium 142  Recent Lipid Panel No results found for: "CHOL", "TRIG", "HDL", "CHOLHDL", "VLDL", "LDLCALC", "LDLDIRECT"   Risk Assessment/Calculations:    CHA2DS2-VASc Score = 5   This indicates a 7.2% annual risk of stroke. The patient's score is based upon: CHF History: 0 HTN History: 1 Diabetes History: 0 Stroke History: 2 Vascular Disease History: 0 Age Score: 2 Gender Score: 0             Physical Exam:    VS:  BP 120/74   Pulse 71   Ht 5\' 9"  (1.753 m)   Wt 243 lb (110.2 kg)   SpO2 95%    BMI 35.88 kg/m     Wt Readings from Last 3 Encounters:  11/27/22 243 lb (110.2 kg)  11/06/22 242 lb (109.8 kg)  10/16/22 238 lb 3.2 oz (108 kg)     GEN:  Well  nourished, well developed in no acute distress HEENT: Normal NECK: No JVD; No carotid bruits LYMPHATICS: No lymphadenopathy CARDIAC: irregular rhythm, regular rate, 2/6 systolic murmur RESPIRATORY:  Clear to auscultation without rales, wheezing or rhonchi  ABDOMEN: Soft, non-tender, non-distended MUSCULOSKELETAL:  No edema; No deformity  SKIN: Warm and dry NEUROLOGIC:  Alert and oriented x 3 PSYCHIATRIC:  Normal affect   ASSESSMENT:    1. Localized swelling of left lower extremity   2. Permanent atrial fibrillation (HCC)   3. Chronic anticoagulation   4. Hyperlipidemia with target LDL less than 70   5. Primary hypertension   6. Cardiac murmur    PLAN:    In order of problems listed above:  Lower extremity swelling - unilateral - 20 mg Lasix as needed  - he states he is not taking lasix as he was told not to - unilateral swelling since knee replacement on that side - swelling better in the morning and worsens throughout the day - given permanent Afib and TIA, will check venous US to rule out DVT   Permanent atrial fibrillation Chronic anticoagulation - rate controlled without BB or CCB - xarelto 20 mg daily - This patients CHA2DS2-VASc Score and unadjusted Ischemic Stroke Rate (% per year) is equal to 7.2 % stroke rate/year from a score of 5 (2age, HTN, 2stroke)   Hypertension - losartan 75 mg - recently stopped hydrochlorothiazide  - will switch losartan to 40 mg olmesartan - keep BP log, bring log and cuff to Sept appt - unclaar BP trend   Hyperlipidemia with LDL goal less than 70 - continue statin   Heart murmur - he does have a systolic murmur on exam - stable mild SOB, no chest pain - I reviewed recent echo 06/2022 - SOB has been happening since before March - since no progression of SOB,  will hold off on repeating echo, consider repeat March 2025   Follow up with me in Sept.          Medication Adjustments/Labs and Tests Ordered: Current medicines are reviewed at length with the patient today.  Concerns regarding medicines are outlined above.  Orders Placed This Encounter  Procedures   VAS [place] LOWER EXTREMITY VENOUS (DVT)   Meds ordered this encounter  Medications   olmesartan (BENICAR) 40 MG tablet    Sig: Take 1 tablet (40 mg total) by mouth daily.    Dispense:  90 tablet    Refill:  3    There are no Patient Instructions on file for this visit.   Signed, Marcelino Duster, [place]  11/27/2022 8:36 AM    [facility]

## 2022-11-26 NOTE — Telephone Encounter (Signed)
Error

## 2022-11-27 ENCOUNTER — Ambulatory Visit: Payer: Medicare HMO | Attending: Physician Assistant | Admitting: Physician Assistant

## 2022-11-27 ENCOUNTER — Encounter: Payer: Self-pay | Admitting: Physician Assistant

## 2022-11-27 VITALS — BP 120/74 | HR 71 | Ht 69.0 in | Wt 243.0 lb

## 2022-11-27 DIAGNOSIS — I10 Essential (primary) hypertension: Secondary | ICD-10-CM | POA: Diagnosis not present

## 2022-11-27 DIAGNOSIS — R011 Cardiac murmur, unspecified: Secondary | ICD-10-CM | POA: Diagnosis not present

## 2022-11-27 DIAGNOSIS — Z7901 Long term (current) use of anticoagulants: Secondary | ICD-10-CM | POA: Diagnosis not present

## 2022-11-27 DIAGNOSIS — R2242 Localized swelling, mass and lump, left lower limb: Secondary | ICD-10-CM | POA: Diagnosis not present

## 2022-11-27 DIAGNOSIS — E785 Hyperlipidemia, unspecified: Secondary | ICD-10-CM

## 2022-11-27 DIAGNOSIS — I4821 Permanent atrial fibrillation: Secondary | ICD-10-CM

## 2022-11-27 MED ORDER — OLMESARTAN MEDOXOMIL 40 MG PO TABS: 40.0000 mg | ORAL_TABLET | Freq: Every day | ORAL | 3 refills | Status: DC

## 2022-11-27 NOTE — Patient Instructions (Signed)
Medication Instructions:  Stop Losartan. Start Olmesartan 40 mg ( Take 1 Tablet Daily). *If you need a refill on your cardiac medications before your next appointment, please call your pharmacy*   Lab Work: No Labs If you have labs (blood work) drawn today and your tests are completely normal, you will receive your results only by: MyChart Message (if you have MyChart) OR A paper copy in the mail If you have any lab test that is abnormal or we need to change your treatment, we will call you to review the results.   Testing/Procedures: [address], [address]. Your physician has requested that you have a lower or upper extremity venous duplex. This test is an ultrasound of the veins in the legs or arms. It looks at venous blood flow that carries blood from the heart to the legs or arms. Allow one hour for a Lower Venous exam. Allow thirty minutes for an Upper Venous exam. There are no restrictions or special instructions.     Follow-Up: At [facility], you and your health needs are our priority.  As part of our continuing mission to provide you with exceptional heart care, we have created designated Provider Care Teams.  These Care Teams include your primary Cardiologist (physician) and Advanced Practice Providers (APPs -  Physician Assistants and Nurse Practitioners) who all work together to provide you with the care you need, when you need it.  We recommend signing up for the patient portal called "MyChart".  Sign up information is provided on this After Visit Summary.  MyChart is used to connect with patients for Virtual Visits (Telemedicine).  Patients are able to view lab/test results, encounter notes, upcoming appointments, etc.  Non-urgent messages can be sent to your provider as well.   To learn more about what you can do with MyChart, go to [email or web address].    Your next appointment:   September 2024  Provider:   [name], PA-C     Other  Instructions Bring Blood Pressure Log and Home Blood Pressure cuff to Follow up Visit.

## 2022-11-28 DIAGNOSIS — C61 Malignant neoplasm of prostate: Secondary | ICD-10-CM | POA: Diagnosis not present

## 2022-11-28 DIAGNOSIS — R102 Pelvic and perineal pain: Secondary | ICD-10-CM | POA: Diagnosis not present

## 2022-12-08 DIAGNOSIS — I10 Essential (primary) hypertension: Secondary | ICD-10-CM | POA: Diagnosis not present

## 2022-12-08 DIAGNOSIS — I482 Chronic atrial fibrillation, unspecified: Secondary | ICD-10-CM | POA: Diagnosis not present

## 2022-12-08 DIAGNOSIS — R519 Headache, unspecified: Secondary | ICD-10-CM | POA: Diagnosis not present

## 2022-12-09 ENCOUNTER — Ambulatory Visit (HOSPITAL_COMMUNITY)
Admission: RE | Admit: 2022-12-09 | Discharge: 2022-12-09 | Disposition: A | Discharge status: Home / Self Care | Payer: Medicare HMO | Source: Ambulatory Visit | Attending: Physician Assistant | Admitting: Physician Assistant

## 2022-12-09 DIAGNOSIS — I4821 Permanent atrial fibrillation: Secondary | ICD-10-CM | POA: Insufficient documentation

## 2022-12-09 DIAGNOSIS — R011 Cardiac murmur, unspecified: Secondary | ICD-10-CM | POA: Insufficient documentation

## 2022-12-09 DIAGNOSIS — I10 Essential (primary) hypertension: Secondary | ICD-10-CM | POA: Diagnosis not present

## 2022-12-09 DIAGNOSIS — Z7901 Long term (current) use of anticoagulants: Secondary | ICD-10-CM | POA: Diagnosis not present

## 2022-12-09 DIAGNOSIS — R2242 Localized swelling, mass and lump, left lower limb: Secondary | ICD-10-CM | POA: Insufficient documentation

## 2022-12-09 DIAGNOSIS — E785 Hyperlipidemia, unspecified: Secondary | ICD-10-CM | POA: Diagnosis not present

## 2022-12-10 ENCOUNTER — Telehealth: Payer: Self-pay

## 2022-12-10 NOTE — Telephone Encounter (Signed)
Spoke with pt. Pt was notified of VAS [place] results. Pt will f/u as planned.

## 2022-12-16 DIAGNOSIS — M1711 Unilateral primary osteoarthritis, right knee: Secondary | ICD-10-CM | POA: Diagnosis not present

## 2022-12-17 NOTE — Progress Notes (Signed)
Cardiology Office Note:    Date:  12/31/2022   ID:  Caleb Barton, DOB 02-Nov-1940, MRN [id number]  PCP:  Shon Hale, MD   [facility] Providers Cardiologist:  Peter Swaziland, MD Cardiology APP:  Marcelino Duster, [place]     Referring MD: Shon Hale, *   Chief Complaint  Patient presents with   Follow-up  HTN  History of Present Illness:    Caleb Barton is a 82 y.o. male with a HTN, PAF diagnosed at colonoscopy s/p DCCV 10/14/16, echo unremarkable. Unfortunately, he converted back to rate controlled Afib after only one day - has not required rate controlling medications. Decision was made to leave him in Afib since he was rate controlled, asymptomatic, and tolerating anticoagulation. Heart monitor 03/2017 to evaluate bradycardia showed no significant symptomatic bradycardia or pauses. He has a history of normal coronaries on heart cath in 2007 (Dr. Katrinka Blazing) in response to abnormal myoview. Hx of LAFB/RBBB, HTN, OSA on CPAP, remote thalamic CVA in 2006, and HLD. I saw him in 2021 for dizziness and DOE.  Nuclear stress test revealed a large inferior defect leading to cardiac catheterization in 2021 that revealed nonobstructive CAD and an LVEDP of 15 mmHg.  He was last seen by Joni Reining, NP 10/16/2022 and reported worsening lower extremity edema in the setting of dietary indiscretion not improved with HCTZ.  She opted to stop HCTZ and started him on Lasix 20 mg x 3 days then as needed.  She did not think he was volume up due to heart failure.  BP was controlled at that visit on losartan 75 mg daily.  Dentist also sent films revealing calcium in the C3-C4 anterior segment of his neck.  Carotid ultrasound was ordered and showed no obstructive disease in his carotid arteries.  He called our office reporting elevated blood pressure and was placed on my schedule on 11/27/22 - reportedly BP was 170, was 120/74 in the office. He also had unilateral swelling for which I  obtained a duplex and ruled out DVT. I switched losartan to olmesartan. He noted he recently stopped hydrochlorothiazide. He was instructed to return with BP log given unclear BP trend.   He presents today for follow up. He did not bring his log, but states his BP is 141/70 at home. He continues to have headaches.  Headaches occur when he wakes up in he morning.  After further discussion I suspect he is dehydrated. Recent brain MRI reassuring. BP well controlled in office. Given age and xarelto, will not add any agents at this time. No longer taking lasix or hydrochlorothiazide - has been elevating legs at night and lower extremity swelling has resolved.    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   Dyspnea    Dysrhythmia    RBBB   Hypercholesteremia    takes Lipitor daily and niacin daily   Hypertension    takes HCTZ daily and losartan daily   Prostate cancer (HCC)    Right knee DJD    Sleep apnea    cpap;sleep study done at home;request report from dr.gates   Stroke [facility])    TIA 15 yrs ago    Past Surgical History:  Procedure Laterality Date   CARDIOVERSION N/A 10/14/2016   Procedure: CARDIOVERSION;  Surgeon: Laurey Morale, MD;  Location: [facility] ENDOSCOPY;  Service: Cardiovascular;  Laterality: N/A;   cataracts both eyes  ioc lens   COLONOSCOPY     with polyps   COLONOSCOPY  08/20/2011   Procedure: COLONOSCOPY;  Surgeon: Vertell Novak., MD;  Location: [facility] ENDOSCOPY;  Service: Endoscopy;  Laterality: N/A;   COLONOSCOPY WITH PROPOFOL N/A 12/17/2016   Procedure: COLONOSCOPY WITH PROPOFOL;  Surgeon: Carman Ching, MD;  Location: [facility] ENDOSCOPY;  Service: Endoscopy;  Laterality: N/A;   COLONOSCOPY WITH PROPOFOL N/A 07/08/2022   Procedure: COLONOSCOPY WITH PROPOFOL;  Surgeon: Kathi Der, MD;  Location: [facility] ENDOSCOPY;  Service: Gastroenterology;  Laterality: N/A;   EYE SURGERY     laser bilateral eyes   I & D KNEE WITH  POLY EXCHANGE Right 07/20/2022   Procedure: IRRIGATION AND DEBRIDEMENT RIGHT KNEE WITH POLY EXCHANGE;  Surgeon: Joen Laura, MD;  Location: [facility] ORS;  Service: Orthopedics;  Laterality: Right;   KNEE ARTHROSCOPY  x 2 right knee   LEFT HEART CATH AND CORONARY ANGIOGRAPHY N/A 07/29/2019   Procedure: LEFT HEART CATH AND CORONARY ANGIOGRAPHY;  Surgeon: Iran Ouch, MD;  Location: [facility] INVASIVE CV LAB;  Service: Cardiovascular;  Laterality: N/A;   LUMBAR LAMINECTOMY/DECOMPRESSION MICRODISCECTOMY Right 09/09/2012   Procedure: LUMBAR LAMINECTOMY/DECOMPRESSION MICRODISCECTOMY 1 LEVEL;  Surgeon: Tia Alert, MD;  Location: [facility] NEURO ORS;  Service: Neurosurgery;  Laterality: Right;  LUMBAR LAMINECTOMY/DECOMPRESSION MICRODISCECTOMY 1 LEVEL   POLYPECTOMY  07/08/2022   Procedure: POLYPECTOMY;  Surgeon: Kathi Der, MD;  Location: [facility] ENDOSCOPY;  Service: Gastroenterology;;   TONSILLECTOMY  yrs ago   TOTAL KNEE ARTHROPLASTY  02/09/2012   Procedure: TOTAL KNEE ARTHROPLASTY;  Surgeon: Nilda Simmer, MD;  Location: [facility] OR;  Service: Orthopedics;  Laterality: Right;   TOTAL KNEE ARTHROPLASTY Left 07/01/2021   Procedure: TOTAL KNEE ARTHROPLASTY;  Surgeon: Joen Laura, MD;  Location: [facility] ORS;  Service: Orthopedics;  Laterality: Left;    Current Medications: Current Meds  Medication Sig   Ascorbic Acid (VITAMIN C) 1000 MG tablet Take 1,000 mg by mouth daily.   atorvastatin (LIPITOR) 40 MG tablet Take 40 mg by mouth at bedtime.   cefadroxil (DURICEF) 500 MG capsule Take 2 capsules (1,000 mg total) by mouth 2 (two) times daily.   dorzolamide-timolol (COSOPT) 22.3-6.8 MG/ML ophthalmic solution Place 1 drop into both eyes 2 (two) times daily.    furosemide (LASIX) 20 MG tablet Take 1 tablet (20 mg total) by mouth as needed for fluid or edema.   hydrochlorothiazide (HYDRODIURIL) 25 MG tablet Take 25 mg by mouth daily.   latanoprost (XALATAN) 0.005 % ophthalmic solution Place 1 drop into both eyes  at bedtime.    methocarbamol (ROBAXIN) 500 MG tablet Take 500 mg by mouth See admin instructions. Take 500 mg by mouth every 4-6 hours as needed for muscle spasms   Multiple Vitamins-Minerals (CENTRUM SILVER 50+MEN) TABS Take 1 tablet by mouth at bedtime.   olmesartan (BENICAR) 40 MG tablet Take 1 tablet (40 mg total) by mouth daily.   rivaroxaban (XARELTO) 20 MG TABS tablet Take 1 tablet (20 mg total) by mouth daily with supper.   tamsulosin (FLOMAX) 0.4 MG CAPS capsule Take 0.4 mg by mouth at bedtime.   TYLENOL 500 MG tablet Take 500-1,000 mg by mouth every 6 (six) hours as needed for mild pain or headache.     Allergies:   Lisinopril and Sulfa antibiotics   Social History   Socioeconomic History   Marital status: Married    Spouse name: Claris Che   Number of children: 2   Years of education: Not on file  Highest education level: Not on file  Occupational History    Comment: retired  Tobacco Use   Smoking status: Former    Current packs/day: 1.00    Average packs/day: 1 pack/day for 20.0 years (20.0 ttl pk-yrs)    Types: Cigarettes    Passive exposure: Never   Smokeless tobacco: Never   Tobacco comments:    quit 35 yrs ago  Vaping Use   Vaping status: Never Used  Substance and Sexual Activity   Alcohol use: Yes    Comment: social   Drug use: No   Sexual activity: Yes  Other Topics Concern   Not on file  Social History Narrative   Not on file   Social Determinants of Health   Financial Resource Strain: Not on file  Food Insecurity: No Food Insecurity (07/18/2022)   Hunger Vital Sign    Worried About Running Out of Food in the Last Year: Never true    Ran Out of Food in the Last Year: Never true  Transportation Needs: No Transportation Needs (07/18/2022)   PRAPARE - Administrator, Civil Service (Medical): No    Lack of Transportation (Non-Medical): No  Physical Activity: Not on file  Stress: Not on file  Social Connections: Not on file     Family  History: The patient's family history includes Bladder Cancer in his brother; Breast cancer in his mother and another family member; Stroke in his mother. There is no history of Prostate cancer or Pancreatic cancer.  ROS:   Please see the history of present illness.     All other systems reviewed and are negative.  EKGs/Labs/Other Studies Reviewed:    The following studies were reviewed today:  Echo 06/2027 1. Left ventricular ejection fraction, by estimation, is 60 to 65%. The  left ventricle has normal function. The left ventricle has no regional  wall motion abnormalities. Left ventricular diastolic parameters are  indeterminate.   2. Right ventricular systolic function is normal. The right ventricular  size is normal. Tricuspid regurgitation signal is inadequate for assessing  PA pressure.   3. No evidence of mitral valve regurgitation.   4. The aortic valve was not well visualized. Aortic valve regurgitation  is not visualized.   5. Aneurysm of the aortic root, measuring 42 mm.   6. The inferior vena cava is dilated in size with <50% respiratory  variability, suggesting right atrial pressure of 15 mmHg.        Recent Labs: 07/22/2022: Magnesium 2.5 11/06/2022: ALT 20; BUN 11; Creat 0.87; Hemoglobin 13.6; Platelets 179; Potassium 3.8; Sodium 142  Recent Lipid Panel No results found for: "CHOL", "TRIG", "HDL", "CHOLHDL", "VLDL", "LDLCALC", "LDLDIRECT"   Risk Assessment/Calculations:    CHA2DS2-VASc Score = 5   This indicates a 7.2% annual risk of stroke. The patient's score is based upon: CHF History: 0 HTN History: 1 Diabetes History: 0 Stroke History: 2 Vascular Disease History: 0 Age Score: 2 Gender Score: 0             Physical Exam:    VS:  BP 130/70 (BP Location: Left Arm, Patient Position: Sitting, Cuff Size: Normal)   Pulse 66   Ht 5\' 9"  (1.753 m)   Wt 234 lb 6.4 oz (106.3 kg)   SpO2 95%   BMI 34.61 kg/m     Wt Readings from Last 3 Encounters:   12/31/22 234 lb 6.4 oz (106.3 kg)  11/27/22 243 lb (110.2 kg)  11/06/22 242 lb (109.8 kg)  GEN:  Well nourished, well developed in no acute distress HEENT: Normal NECK: No JVD; No carotid bruits LYMPHATICS: No lymphadenopathy CARDIAC: irregular rhythm, regular rate RESPIRATORY:  Clear to auscultation without rales, wheezing or rhonchi  ABDOMEN: Soft, non-tender, non-distended MUSCULOSKELETAL:  No edema; No deformity  SKIN: Warm and dry NEUROLOGIC:  Alert and oriented x 3 PSYCHIATRIC:  Normal affect   ASSESSMENT:    1. Primary hypertension    PLAN:    In order of problems listed above:  Lower extremity swelling - 20 mg Lasix as needed - ruled out DVT - largely resolved   Permanent atrial fibrillation Chronic anticoagulation - rate controlled without BB or CCB - xarelto 20 mg daily - This patients CHA2DS2-VASc Score and unadjusted Ischemic Stroke Rate (% per year) is equal to 7.2 % stroke rate/year from a score of 5 (2age, HTN, 2stroke) - no bleeding issues   Hypertension - losartan 75 mg switched to 40 mg olmesartan - BMP today   Hyperlipidemia with LDL goal less than 70 - continue statin   Heart mumur on exam Echo reviewed 06/2022 - since no progression in SOB, held off on repeating an echo - repeat in march 2025   Headache - reassuring brain MRI this month - not significantly hypertensive per his report - suspect dehydration with some beer consumption   Follow up in 6 months.        Medication Adjustments/Labs and Tests Ordered: Current medicines are reviewed at length with the patient today.  Concerns regarding medicines are outlined above.  Orders Placed This Encounter  Procedures   Basic metabolic panel   No orders of the defined types were placed in this encounter.   Patient Instructions  Medication Instructions:  No Changes *If you need a refill on your cardiac medications before your next appointment, please call your  pharmacy*   Lab Work: BMET Today If you have labs (blood work) drawn today and your tests are completely normal, you will receive your results only by: MyChart Message (if you have MyChart) OR A paper copy in the mail If you have any lab test that is abnormal or we need to change your treatment, we will call you to review the results.   Testing/Procedures: No Testing   Follow-Up: At [facility], you and your health needs are our priority.  As part of our continuing mission to provide you with exceptional heart care, we have created designated Provider Care Teams.  These Care Teams include your primary Cardiologist (physician) and Advanced Practice Providers (APPs -  Physician Assistants and Nurse Practitioners) who all work together to provide you with the care you need, when you need it.  We recommend signing up for the patient portal called "MyChart".  Sign up information is provided on this After Visit Summary.  MyChart is used to connect with patients for Virtual Visits (Telemedicine).  Patients are able to view lab/test results, encounter notes, upcoming appointments, etc.  Non-urgent messages can be sent to your provider as well.   To learn more about what you can do with MyChart, go to [email or web address].    Your next appointment:   6 month(s)  Provider:   Peter Swaziland, MD     Signed, Roe Rutherford Orin, [place]  12/31/2022 9:14 AM    [facility]

## 2022-12-18 DIAGNOSIS — M1711 Unilateral primary osteoarthritis, right knee: Secondary | ICD-10-CM | POA: Diagnosis not present

## 2022-12-19 DIAGNOSIS — Z23 Encounter for immunization: Secondary | ICD-10-CM | POA: Diagnosis not present

## 2022-12-19 DIAGNOSIS — Z9989 Dependence on other enabling machines and devices: Secondary | ICD-10-CM | POA: Diagnosis not present

## 2022-12-19 DIAGNOSIS — G44229 Chronic tension-type headache, not intractable: Secondary | ICD-10-CM | POA: Diagnosis not present

## 2022-12-23 DIAGNOSIS — M1711 Unilateral primary osteoarthritis, right knee: Secondary | ICD-10-CM | POA: Diagnosis not present

## 2022-12-24 ENCOUNTER — Ambulatory Visit (HOSPITAL_COMMUNITY)
Admission: RE | Admit: 2022-12-24 | Discharge: 2022-12-24 | Disposition: A | Discharge status: Home / Self Care | Payer: Medicare HMO | Source: Ambulatory Visit | Attending: Family Medicine | Admitting: Family Medicine

## 2022-12-24 ENCOUNTER — Telehealth: Payer: Self-pay

## 2022-12-24 ENCOUNTER — Other Ambulatory Visit (HOSPITAL_COMMUNITY): Payer: Self-pay | Admitting: Family Medicine

## 2022-12-24 DIAGNOSIS — G459 Transient cerebral ischemic attack, unspecified: Secondary | ICD-10-CM | POA: Diagnosis not present

## 2022-12-24 DIAGNOSIS — Z8673 Personal history of transient ischemic attack (TIA), and cerebral infarction without residual deficits: Secondary | ICD-10-CM

## 2022-12-24 DIAGNOSIS — R519 Headache, unspecified: Secondary | ICD-10-CM

## 2022-12-24 DIAGNOSIS — R9089 Other abnormal findings on diagnostic imaging of central nervous system: Secondary | ICD-10-CM | POA: Diagnosis not present

## 2022-12-24 DIAGNOSIS — I6782 Cerebral ischemia: Secondary | ICD-10-CM | POA: Diagnosis not present

## 2022-12-24 DIAGNOSIS — I4891 Unspecified atrial fibrillation: Secondary | ICD-10-CM | POA: Diagnosis not present

## 2022-12-24 MED ORDER — GADOBUTROL 1 MMOL/ML IV SOLN
7.5000 mL | Freq: Once | INTRAVENOUS | Status: AC | PRN
  Administered 2022-12-24: 7.5 mL via INTRAVENOUS

## 2022-12-24 NOTE — Telephone Encounter (Signed)
I spoke Caleb Barton and advised her ok for patient to take prednisone per Dr. Renold Don

## 2022-12-24 NOTE — Telephone Encounter (Signed)
Caleb Barton with Dr. Christena Flake office patient pcp called stating patient has been coming to see them for muscle spasms and muscle relaxers are not helping.  Dr. Chanetta Marshall is arranging to get an MRI done for the patient,but in the meantime she would like to start him on prednisone.  She wanted to follow up with you make sure it would be ok with the patient starting prednisone. Please advise. [name] [name], CMA   Phone # [phone]

## 2022-12-30 DIAGNOSIS — M1711 Unilateral primary osteoarthritis, right knee: Secondary | ICD-10-CM | POA: Diagnosis not present

## 2022-12-31 ENCOUNTER — Ambulatory Visit: Payer: Medicare HMO | Attending: Physician Assistant | Admitting: Physician Assistant

## 2022-12-31 ENCOUNTER — Encounter: Payer: Self-pay | Admitting: Physician Assistant

## 2022-12-31 VITALS — BP 130/70 | HR 66 | Ht 69.0 in | Wt 234.4 lb

## 2022-12-31 DIAGNOSIS — E785 Hyperlipidemia, unspecified: Secondary | ICD-10-CM

## 2022-12-31 DIAGNOSIS — Z7901 Long term (current) use of anticoagulants: Secondary | ICD-10-CM | POA: Diagnosis not present

## 2022-12-31 DIAGNOSIS — M7989 Other specified soft tissue disorders: Secondary | ICD-10-CM | POA: Diagnosis not present

## 2022-12-31 DIAGNOSIS — I4821 Permanent atrial fibrillation: Secondary | ICD-10-CM | POA: Diagnosis not present

## 2022-12-31 DIAGNOSIS — I10 Essential (primary) hypertension: Secondary | ICD-10-CM

## 2022-12-31 LAB — BASIC METABOLIC PANEL
BUN/Creatinine Ratio: 20 (ref 10–24)
BUN: 15 mg/dL (ref 8–27)
CO2: 29 mmol/L (ref 20–29)
Calcium: 9.5 mg/dL (ref 8.6–10.2)
Chloride: 102 mmol/L (ref 96–106)
Creatinine, Ser: 0.74 mg/dL — ABNORMAL LOW (ref 0.76–1.27)
Glucose: 86 mg/dL (ref 70–99)
Potassium: 3.5 mmol/L (ref 3.5–5.2)
Sodium: 142 mmol/L (ref 134–144)
eGFR: 92 mL/min/{1.73_m2} (ref >59)

## 2022-12-31 NOTE — Patient Instructions (Signed)
Medication Instructions:  No Changes *If you need a refill on your cardiac medications before your next appointment, please call your pharmacy*   Lab Work: BMET Today If you have labs (blood work) drawn today and your tests are completely normal, you will receive your results only by: MyChart Message (if you have MyChart) OR A paper copy in the mail If you have any lab test that is abnormal or we need to change your treatment, we will call you to review the results.   Testing/Procedures: No Testing   Follow-Up: At [facility], you and your health needs are our priority.  As part of our continuing mission to provide you with exceptional heart care, we have created designated Provider Care Teams.  These Care Teams include your primary Cardiologist (physician) and Advanced Practice Providers (APPs -  Physician Assistants and Nurse Practitioners) who all work together to provide you with the care you need, when you need it.  We recommend signing up for the patient portal called "MyChart".  Sign up information is provided on this After Visit Summary.  MyChart is used to connect with patients for Virtual Visits (Telemedicine).  Patients are able to view lab/test results, encounter notes, upcoming appointments, etc.  Non-urgent messages can be sent to your provider as well.   To learn more about what you can do with MyChart, go to [email or web address].    Your next appointment:   6 month(s)  Provider:   Peter Swaziland, MD

## 2023-01-06 DIAGNOSIS — G4489 Other headache syndrome: Secondary | ICD-10-CM | POA: Diagnosis not present

## 2023-01-06 DIAGNOSIS — J449 Chronic obstructive pulmonary disease, unspecified: Secondary | ICD-10-CM | POA: Diagnosis not present

## 2023-01-08 DIAGNOSIS — M1711 Unilateral primary osteoarthritis, right knee: Secondary | ICD-10-CM | POA: Diagnosis not present

## 2023-01-10 ENCOUNTER — Other Ambulatory Visit: Payer: Self-pay | Admitting: Urology

## 2023-01-13 DIAGNOSIS — M1711 Unilateral primary osteoarthritis, right knee: Secondary | ICD-10-CM | POA: Diagnosis not present

## 2023-01-14 DIAGNOSIS — M1711 Unilateral primary osteoarthritis, right knee: Secondary | ICD-10-CM | POA: Diagnosis not present

## 2023-01-16 ENCOUNTER — Encounter: Payer: Self-pay | Admitting: Internal Medicine

## 2023-01-16 ENCOUNTER — Other Ambulatory Visit: Payer: Self-pay

## 2023-01-16 ENCOUNTER — Ambulatory Visit: Payer: Medicare HMO | Admitting: Internal Medicine

## 2023-01-16 VITALS — BP 138/75 | HR 78 | Resp 16 | Ht 69.0 in | Wt 234.4 lb

## 2023-01-16 DIAGNOSIS — T8450XD Infection and inflammatory reaction due to unspecified internal joint prosthesis, subsequent encounter: Secondary | ICD-10-CM

## 2023-01-16 DIAGNOSIS — T8453XD Infection and inflammatory reaction due to internal right knee prosthesis, subsequent encounter: Secondary | ICD-10-CM | POA: Diagnosis not present

## 2023-01-16 NOTE — Patient Instructions (Addendum)
Let's go down to cefadroxil 500 mg one tablet twice a day   I'll see you in 3 months in follow up (ok to do after the new year)

## 2023-01-16 NOTE — Progress Notes (Signed)
[facility]  CHIEF COMPLAINT:    Follow up for knee infection  SUBJECTIVE:    Caleb Barton is a 82 y.o. male with PMHx as below who presents to the clinic for knee infection.   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  Status post DAIR with orthopedic surgery 07/20/22 with OR cultures that isolated GAS as well.  He was discharged on IV penicillin via PICC line through 08/31/22 to complete 6 weeks of IV therapy.  He was discharged to SNF and reports doing well.  He is planning to go home on Thursday.    Additionally, as part of his admission patient was found to have elevated LFTs and found to have positive hepatitis B surface Ag.  He reports no prior hepatitis B history.  LFTs in March 2023 were normalized.   Please see A&P for the details of today's visit and status of the patient's medical problems.   ------------------ 08/29/22 id clinic f/u Doing well Intermittent pain right knee 2/10 at most Doesn't like iv med No n/v/diarrhea No f/c No rash Hb a little low but no blood per rectum or exertional dyspnea  ------------- 01/16/23 See below in A&P for current utd history/progress  Patient's Medications  New Prescriptions   No medications on file  Previous Medications   ASCORBIC ACID (VITAMIN C) 1000 MG TABLET    Take 1,000 mg by mouth daily.   ATORVASTATIN (LIPITOR) 40 MG TABLET    Take 40 mg by mouth at bedtime.   CEFADROXIL (DURICEF) 500 MG CAPSULE    Take 2 capsules (1,000 mg total) by mouth 2 (two) times daily.   DORZOLAMIDE-TIMOLOL (COSOPT) 22.3-6.8 MG/ML OPHTHALMIC SOLUTION    Place 1 drop into both eyes 2 (two) times daily.    FUROSEMIDE (LASIX) 20 MG TABLET    Take 1 tablet (20 mg total) by mouth as needed for fluid or edema.   HYDROCHLOROTHIAZIDE (HYDRODIURIL) 25 MG TABLET    Take 25 mg by mouth daily.   LATANOPROST (XALATAN) 0.005 % OPHTHALMIC  SOLUTION    Place 1 drop into both eyes at bedtime.    METHOCARBAMOL (ROBAXIN) 500 MG TABLET    Take 500 mg by mouth See admin instructions. Take 500 mg by mouth every 4-6 hours as needed for muscle spasms   MULTIPLE VITAMINS-MINERALS (CENTRUM SILVER 50+MEN) TABS    Take 1 tablet by mouth at bedtime.   OLMESARTAN (BENICAR) 40 MG TABLET    Take 1 tablet (40 mg total) by mouth daily.   OXYCODONE (OXY IR/ROXICODONE) 5 MG IMMEDIATE RELEASE TABLET    Take 1 tablet (5 mg total) by mouth every 4 (four) hours as needed for moderate pain.   OXYCODONE (OXY IR/ROXICODONE) 5 MG IMMEDIATE RELEASE TABLET    Take 1 tablet (5 mg total) by mouth every 4 (four) hours as needed for moderate pain.   POLYETHYLENE GLYCOL (MIRALAX / GLYCOLAX) 17 G PACKET    Take 17 g by mouth daily as needed for mild constipation.   RIVAROXABAN (XARELTO) 20 MG TABS TABLET    Take 1 tablet (20 mg total) by mouth daily with supper.   TAMSULOSIN (FLOMAX) 0.4 MG CAPS CAPSULE    Take 0.4 mg by mouth at bedtime.   TYLENOL 500 MG TABLET    Take 500-1,000 mg by mouth every 6 (six) hours as needed for  mild pain or headache.  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   Dyspnea    Dysrhythmia    RBBB   Hypercholesteremia    takes Lipitor daily and niacin daily   Hypertension    takes HCTZ daily and losartan daily   Prostate cancer (HCC)    Right knee DJD    Sleep apnea    cpap;sleep study done at home;request report from dr.gates   Stroke [facility])    TIA 15 yrs ago    Social History   Tobacco Use   Smoking status: Former    Current packs/day: 1.00    Average packs/day: 1 pack/day for 20.0 years (20.0 ttl pk-yrs)    Types: Cigarettes    Passive exposure: Never   Smokeless tobacco: Never   Tobacco comments:    quit 35 yrs ago  Vaping Use   Vaping status: Never Used   Substance Use Topics   Alcohol use: Yes    Comment: social   Drug use: No    Family History  Problem Relation Age of Onset   Stroke Mother    Breast cancer Mother    Bladder Cancer Brother    Breast cancer Other    Prostate cancer Neg Hx    Pancreatic cancer Neg Hx     Allergies  Allergen Reactions   Lisinopril Cough   Sulfa Antibiotics Rash    Review of Systems  All other systems reviewed and are negative.    OBJECTIVE:    There were no vitals filed for this visit.   There is no height or weight on file to calculate BMI.  Physical exam: General/constitutional: no distress, pleasant HEENT: Normocephalic, PER, Conj Clear, EOMI, Oropharynx clear Neck supple CV: rrr no mrg Lungs: clear to auscultation, normal respiratory effort Abd: Soft, Nontender Ext: no edema Skin: No Rash Neuro: nonfocal MSK: bilateral knee replacement scar -- full rom without tenderness; no swelling    Labs and Microbiology:    Component Value Date/Time   CRP <3.0 11/06/2022 0908   CRP <3.0 10/02/2022 1000       Latest Ref Rng & Units 11/06/2022    9:08 AM 10/02/2022   10:00 AM 07/23/2022    4:29 AM  CBC  WBC 3.8 - 10.8 Thousand/uL 5.1  4.1  6.4   Hemoglobin 13.2 - 17.1 g/dL 72.5  36.6  44.0   Hematocrit 38.5 - 50.0 % 41.9  39.9  37.7   Platelets 140 - 400 Thousand/uL 179  215  217       Latest Ref Rng & Units 12/31/2022    9:08 AM 11/06/2022    9:08 AM 10/27/2022    9:45 AM  CMP  Glucose 70 - 99 mg/dL 86  78  81   BUN 8 - 27 mg/dL 15  11  9    Creatinine 0.76 - 1.27 mg/dL 3.47  4.25  9.56   Sodium 134 - 144 mmol/L 142  142  145   Potassium 3.5 - 5.2 mmol/L 3.5  3.8  4.0   Chloride 96 - 106 mmol/L 102  109  104   CO2 20 - 29 mmol/L 29  26  27    Calcium 8.6 - 10.2 mg/dL 9.5  9.3  9.3   Total Protein 6.1 - 8.1 g/dL  6.1    Total Bilirubin 0.2 - 1.2 mg/dL  1.0    AST 10 - 35 U/L  22    ALT 9 - 46 U/L  20       No results found for this or any previous visit (from the past  240 hour(s)).    ASSESSMENT & PLAN:      S/p dair for GAS bsi/prosthetic right knee S/p 6 weeks pcn g iv On cefadroxil chronic suppression since 08/31/22   Planned at least 6-12 months; tx dose 1000 mg bid for 3 months then potentially 500 mg bid there after if the knee looks good    ------------- 10/02/22 id clinic assessment Right knee bothers him getting in and out of truck/up-down stairs. He is doing pt/ot. Hurts when he goes up and down stairs can be up to 5/10. It's been the same since last visit He hasn't seen ortho in follow since post-op follow up Tolerating cefadroxil 1000 -- no side effect Labs today  If crp is normal x3, will decrease dose to 500 mg twice a day with cefadroxil See me mid to end of July  11/06/22 id clinic assessment Similar clinical status with the right knee as previous visit.  Tolerating cefadroxil 1 gram bid Crp normal 10/02/22 -- will get another crp today and if clinical status remains the same by another 6-8 weeks visit will transition to 500 mg bid cefadroxil for chronic suppression  Elevated bp. Advise monitoring at home and discussing with pcp if continue to be >140s/90s  Venous stasis. Said lasix doesn't seem to work any more. Advise him to try compression stocking and using lasix prn only for acute worsening. No hx chf. F/u pcp   01/16/23 id assessment Reviewed crp all normal Clinically stable without increased pain/swelling and appeared infection well suppressed  Will go down to cefadroxil 500 mg po bid starting today  F/u 3 months and recheck crp at that time    [facility] 01/16/2023, 8:52 AM

## 2023-01-18 DIAGNOSIS — H6062 Unspecified chronic otitis externa, left ear: Secondary | ICD-10-CM | POA: Diagnosis not present

## 2023-01-21 DIAGNOSIS — H6502 Acute serous otitis media, left ear: Secondary | ICD-10-CM | POA: Diagnosis not present

## 2023-01-21 DIAGNOSIS — M1711 Unilateral primary osteoarthritis, right knee: Secondary | ICD-10-CM | POA: Diagnosis not present

## 2023-01-23 DIAGNOSIS — M1711 Unilateral primary osteoarthritis, right knee: Secondary | ICD-10-CM | POA: Diagnosis not present

## 2023-01-26 IMAGING — CR DG CHEST 2V
2 series · 2 of 2 positions shown · non-contrast
Comparison: Chest x-ray 03/07/2020.

CLINICAL DATA: Chronic cough and shortness of breath.

EXAM:
CHEST - 2 VIEW

[w chest pa]
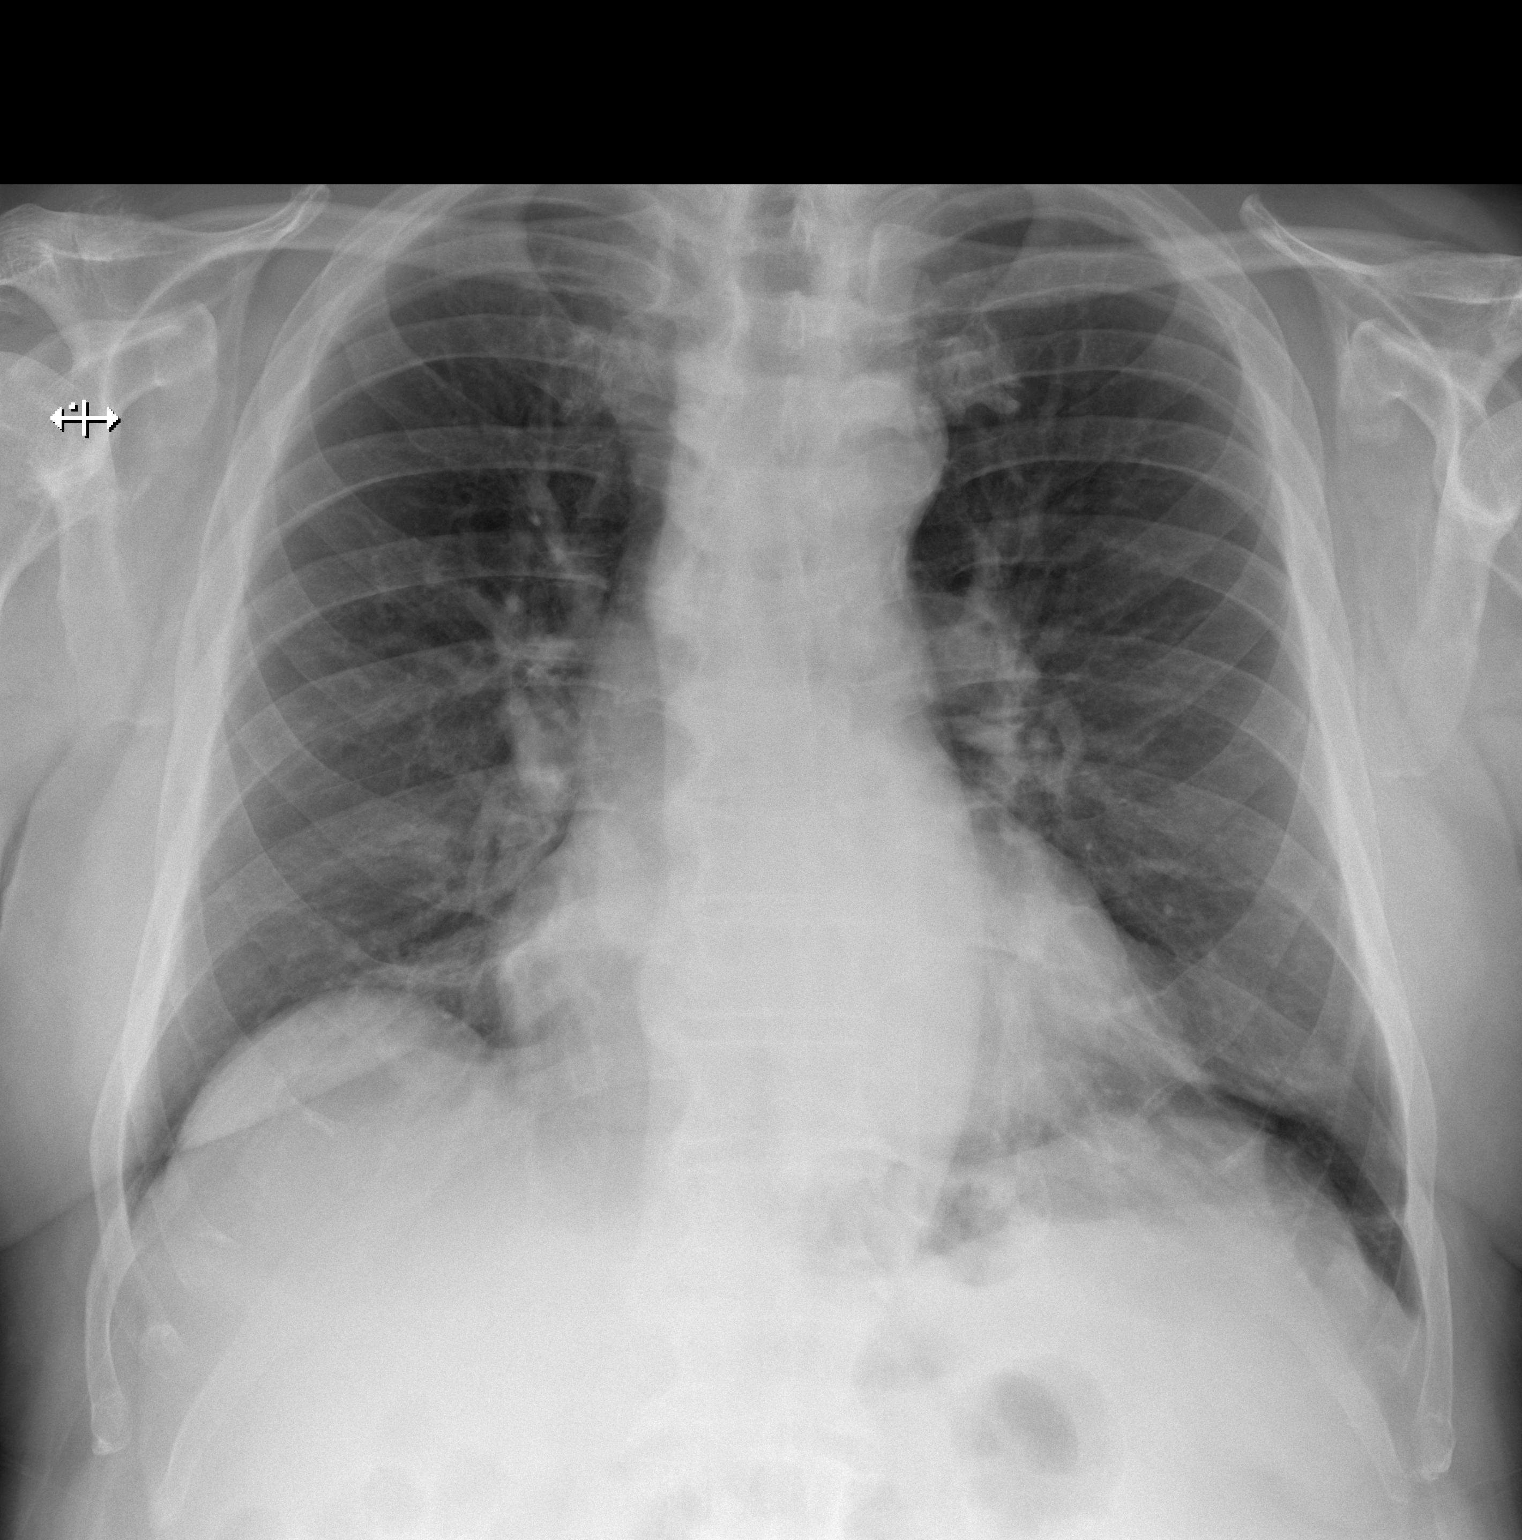

[w chest lat]
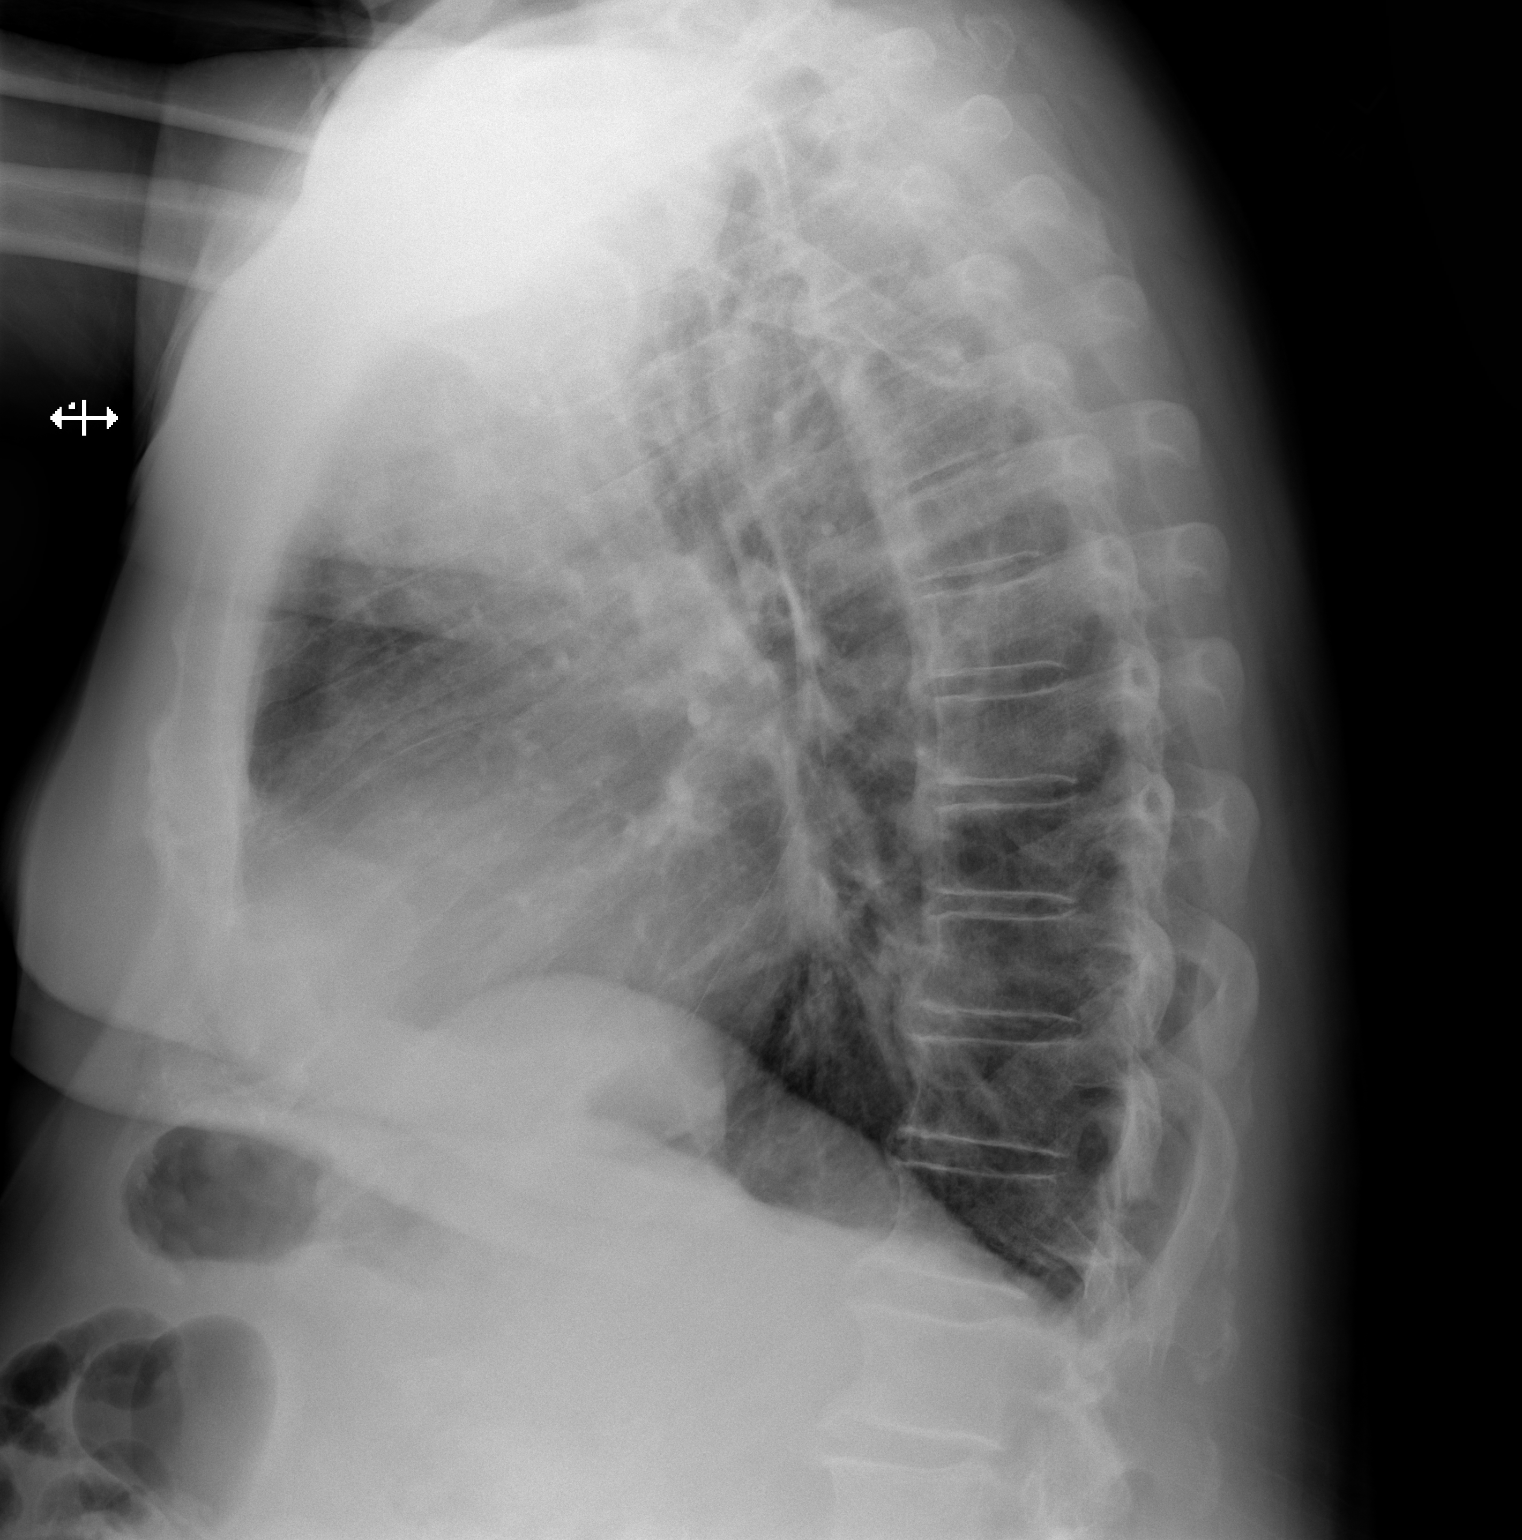

[2 of 2 positions shown; findings below may reference images not displayed]

FINDINGS: The heart size and mediastinal contours are within normal limits.
Both lungs are clear. The visualized skeletal structures are
unremarkable.
IMPRESSION: No active cardiopulmonary disease.

## 2023-02-10 DIAGNOSIS — G4733 Obstructive sleep apnea (adult) (pediatric): Secondary | ICD-10-CM | POA: Diagnosis not present

## 2023-02-11 ENCOUNTER — Other Ambulatory Visit: Payer: Self-pay

## 2023-02-11 DIAGNOSIS — I4821 Permanent atrial fibrillation: Secondary | ICD-10-CM

## 2023-02-11 MED ORDER — RIVAROXABAN 20 MG PO TABS
20.0000 mg | ORAL_TABLET | Freq: Every day | ORAL | 5 refills | Status: DC
Start: 2023-02-11 — End: 2023-08-14

## 2023-02-11 NOTE — Telephone Encounter (Signed)
Prescription refill request for Xarelto received.  Indication: Afib  Last office visit: 12/31/22 ([facility])  Weight: 106.3kg Age:82 Scr: 0.74 (12/31/22)  CrCl: 119.12ml/min  Appropriate dose. Refill sent.

## 2023-03-22 NOTE — Progress Notes (Unsigned)
  Cardiology Office Note:  .   Date:  03/22/2023  ID:  Caleb Barton, DOB [birth year], MRN [id number] PCP: Caleb Hale, MD  [facility] Providers Cardiologist:  Caleb Swaziland, MD   }   History of Present Illness: .   Caleb Barton is a 82 y.o. male  HTN, PAF diagnosed at colonoscopy s/p DCCV 10/14/16, echo unremarkable. Unfortunately, he converted back to rate controlled Afib after only one day - has not required rate controlling medications. Decision was made to leave him in Afib since he was rate controlled, asymptomatic, and tolerating anticoagulation on Xarelto 20 mg daily.  When last seen in the office on 12/31/2022 by Caleb Flesher, PA, the patient was hypertensive and he was switched from losartan 75 mg to 40 mg of olmesartan daily.  Studies Reviewed: .   Echocardiogram 07/18/2022 1. Left ventricular ejection fraction, by estimation, is 60 to 65%. The  left ventricle has normal function. The left ventricle has no regional  wall motion abnormalities. Left ventricular diastolic parameters are  indeterminate.   2. Right ventricular systolic function is normal. The right ventricular  size is normal. Tricuspid regurgitation signal is inadequate for assessing  PA pressure.   3. No evidence of mitral valve regurgitation.   4. The aortic valve was not well visualized. Aortic valve regurgitation  is not visualized.   5. Aneurysm of the aortic root, measuring 42 mm.   6. The inferior vena cava is dilated in size with <50% respiratory  variability, suggesting right atrial pressure of 15 mmHg.   *** EKG Interpretation Date/Time:    Ventricular Rate:    PR Interval:    QRS Duration:    QT Interval:    QTC Calculation:   R Axis:      Text Interpretation:      Physical Exam:   VS:  There were no vitals taken for this visit.   Wt Readings from Last 3 Encounters:  01/16/23 234 lb 6.4 oz (106.3 kg)  12/31/22 234 lb 6.4 oz (106.3 kg)  11/27/22 243 lb (110.2 kg)    GEN:  Well nourished, well developed in no acute distress NECK: No JVD; No carotid bruits CARDIAC: ***RRR, no murmurs, rubs, gallops RESPIRATORY:  Clear to auscultation without rales, wheezing or rhonchi  ABDOMEN: Soft, non-tender, non-distended EXTREMITIES:  No edema; No deformity   ASSESSMENT AND PLAN: .   ***    {Are you ordering a CV Procedure (e.g. stress test, cath, DCCV, TEE, etc)?   Press F2        :[id number]}    Signed, Caleb Barton. Liborio Nixon, ANP, AACC

## 2023-03-24 ENCOUNTER — Ambulatory Visit: Payer: Medicare HMO | Admitting: Adult Health

## 2023-03-24 DIAGNOSIS — J441 Chronic obstructive pulmonary disease with (acute) exacerbation: Secondary | ICD-10-CM | POA: Diagnosis not present

## 2023-03-24 DIAGNOSIS — R051 Acute cough: Secondary | ICD-10-CM | POA: Diagnosis not present

## 2023-04-01 DIAGNOSIS — I10 Essential (primary) hypertension: Secondary | ICD-10-CM | POA: Diagnosis not present

## 2023-04-01 DIAGNOSIS — R053 Chronic cough: Secondary | ICD-10-CM | POA: Diagnosis not present

## 2023-04-09 DIAGNOSIS — M1711 Unilateral primary osteoarthritis, right knee: Secondary | ICD-10-CM | POA: Diagnosis not present

## 2023-04-17 DIAGNOSIS — N5082 Scrotal pain: Secondary | ICD-10-CM | POA: Diagnosis not present

## 2023-04-17 DIAGNOSIS — J42 Unspecified chronic bronchitis: Secondary | ICD-10-CM | POA: Diagnosis not present

## 2023-04-17 DIAGNOSIS — I77810 Thoracic aortic ectasia: Secondary | ICD-10-CM | POA: Diagnosis not present

## 2023-04-21 NOTE — Progress Notes (Signed)
 Cardiology Office Note:  .   Date: 04/24/2023 ID:  Caleb Barton, DOB 1940-08-23, MRN [id number] PCP: Caleb Lamarr RAMAN, MD  [facility] Providers Cardiologist:  Caleb Jordan, MD   }   History of Present Illness: .   Caleb Barton is a 82 y.o. male  HTN, PAF diagnosed at colonoscopy s/p DCCV 10/14/16, echo unremarkable. Unfortunately, he converted back to rate controlled Afib after only one day - has not required rate controlling medications. Decision was made to leave him in Afib since he was rate controlled, asymptomatic, and tolerating anticoagulation on Xarelto  20 mg daily.  When last seen in the office on 12/31/2022 by Caleb Hails, PA, the patient was hypertensive and he was switched from losartan  75 mg to 40 mg of olmesartan  daily.   He comes today without any cardiac complaints.  He is cardiac unaware concerning his A-fib.  He denies any bleeding or excessive bruising on the Xarelto .  Blood pressure is much better controlled on changed to olmesartan .  Studies Reviewed: .   Echocardiogram 07/18/2022 1. Left ventricular ejection fraction, by estimation, is 60 to 65%. The  left ventricle has normal function. The left ventricle has no regional  wall motion abnormalities. Left ventricular diastolic parameters are  indeterminate.   2. Right ventricular systolic function is normal. The right ventricular  size is normal. Tricuspid regurgitation signal is inadequate for assessing  PA pressure.   3. No evidence of mitral valve regurgitation.   4. The aortic valve was not well visualized. Aortic valve regurgitation  is not visualized.   5. Aneurysm of the aortic root, measuring 42 mm.   6. The inferior vena cava is dilated in size with <50% respiratory  variability, suggesting right atrial pressure of 15 mmHg.    EKG Interpretation Date/Time:  Friday April 24 2023 08:12:16 EST Ventricular Rate:  77 PR Interval:    QRS Duration:  142 QT Interval:  436 QTC Calculation: 493 R  Axis:   -71  Text Interpretation: Atrial fibrillation with premature ventricular or aberrantly conducted complexes Left axis deviation Right bundle branch block When compared with ECG of 18-Jul-2022 16:18, QT has lengthened Confirmed by Jerilynn Lamarr [phone]) on 04/24/2023 8:33:55 AM    Physical Exam:   VS:  BP 122/80 (BP Location: Left Arm, Patient Position: Sitting)   Pulse 77   Ht 5' 9 (1.753 m)   Wt 247 lb (112 kg)   SpO2 96%   BMI 36.48 kg/m    Wt Readings from Last 3 Encounters:  04/24/23 247 lb (112 kg)  01/16/23 234 lb 6.4 oz (106.3 kg)  12/31/22 234 lb 6.4 oz (106.3 kg)    GEN: Well nourished, well developed in no acute distress NECK: No JVD; No carotid bruits CARDIAC: IRRR, no murmurs, rubs, gallops RESPIRATORY:  Clear to auscultation without rales, wheezing or rhonchi  ABDOMEN: Soft, non-tender, non-distended EXTREMITIES:  No edema; No deformity mild skin discoloration from venous stasis good pulses bilaterally.  ASSESSMENT AND PLAN: .    Permanent atrial fibrillation: Heart rate is very well-controlled currently, on medication regimen and continues to do well on Xarelto .  Will not make any changes at this time.  Echocardiogram will be repeated in February 2025 to be made available for his follow-up appointment with Dr. Jordan in March 2025.  2.  Hypertension: Excellent control of blood pressure with change to olmesartan .  Continue HCTZ as directed.  He feels well he is stating that his fluid retention is much  better.  He rarely uses Lasix  for edema.  He would like to be more active.  He states with aging that is sometimes difficult.  I have encouraged him to be as active as he possible.  Labs are followed by PCP.  Most recent were completed in July 2024 through [facility] physicians.  3.  History of CVA: Doing well without any deficiencies.         Signed, Lamarr HERO. Jerilynn CHOL, ANP, AACC

## 2023-04-23 IMAGING — DX DG KNEE 1-2V PORT*L*
2 series · 2 of 2 positions shown · non-contrast
Comparison: Bilateral knee radiographs 06/19/2020

CLINICAL DATA: Postop left knee

EXAM:
PORTABLE LEFT KNEE - 1-2 VIEW

[knee ap]
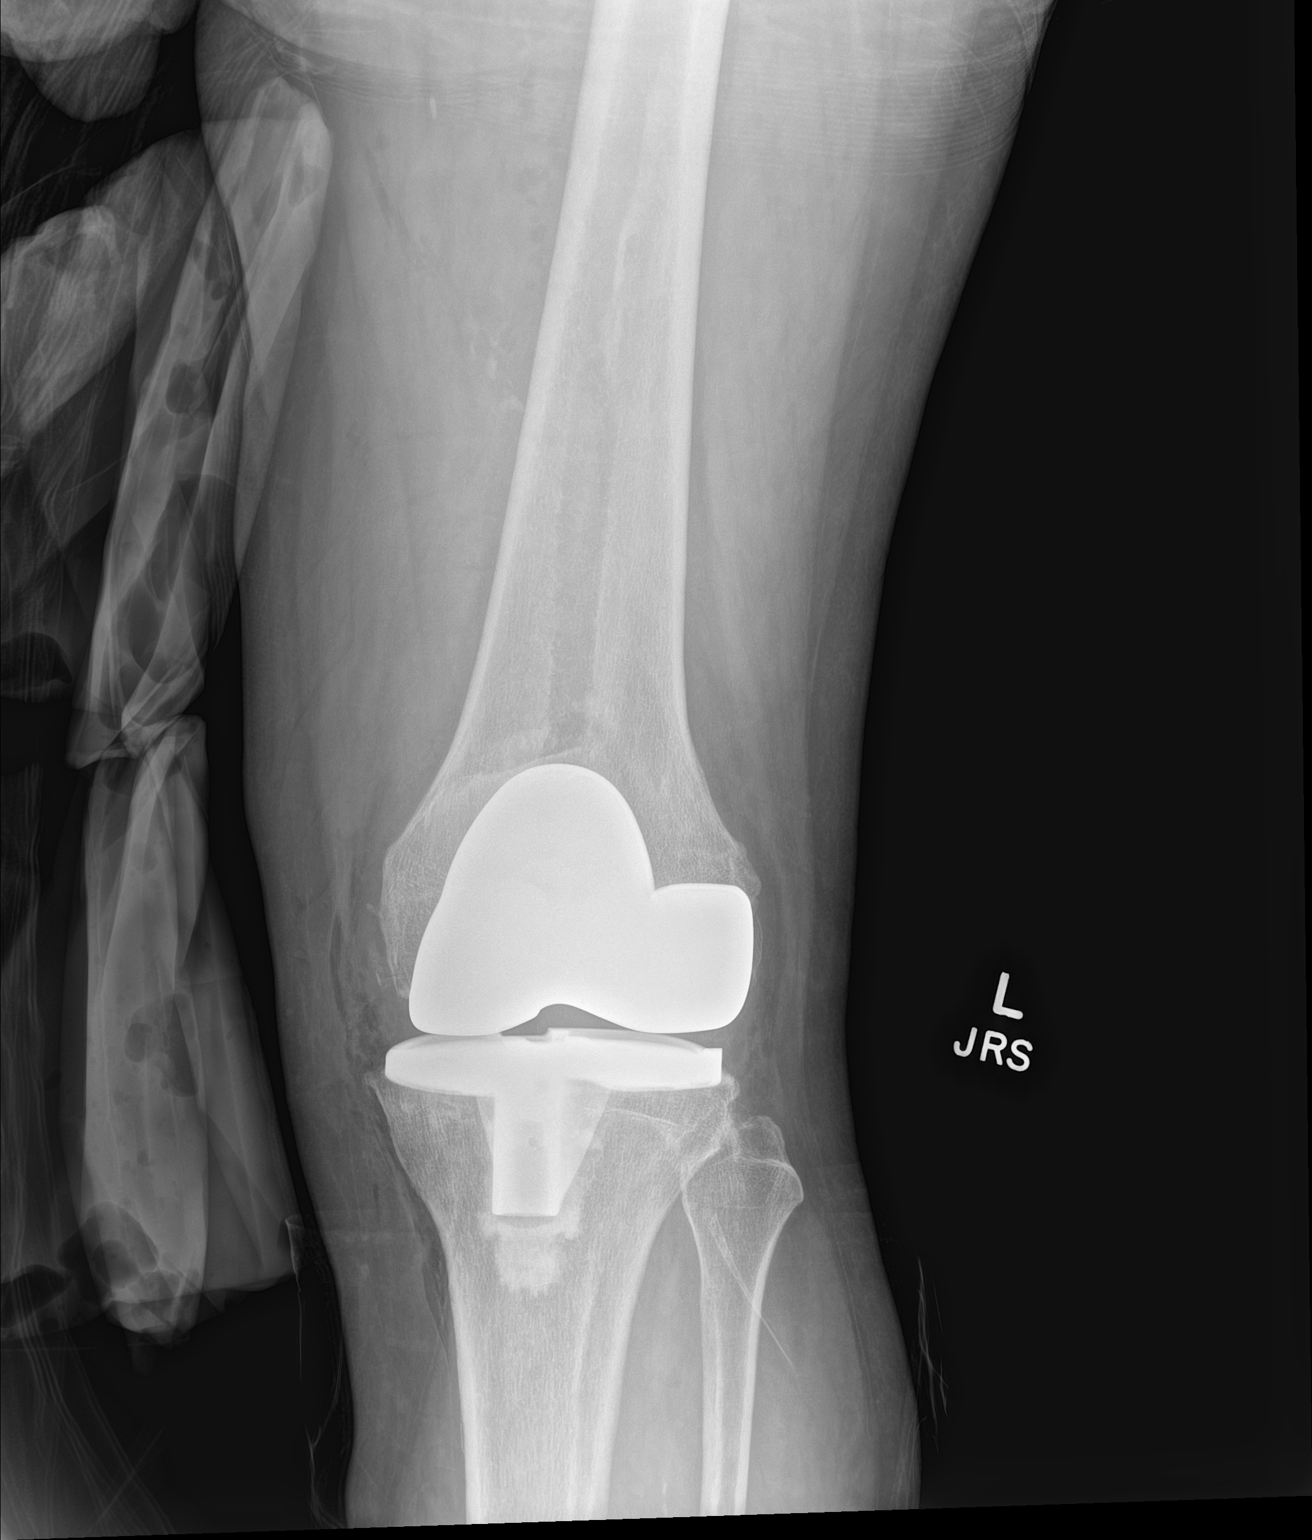

[knee obl]
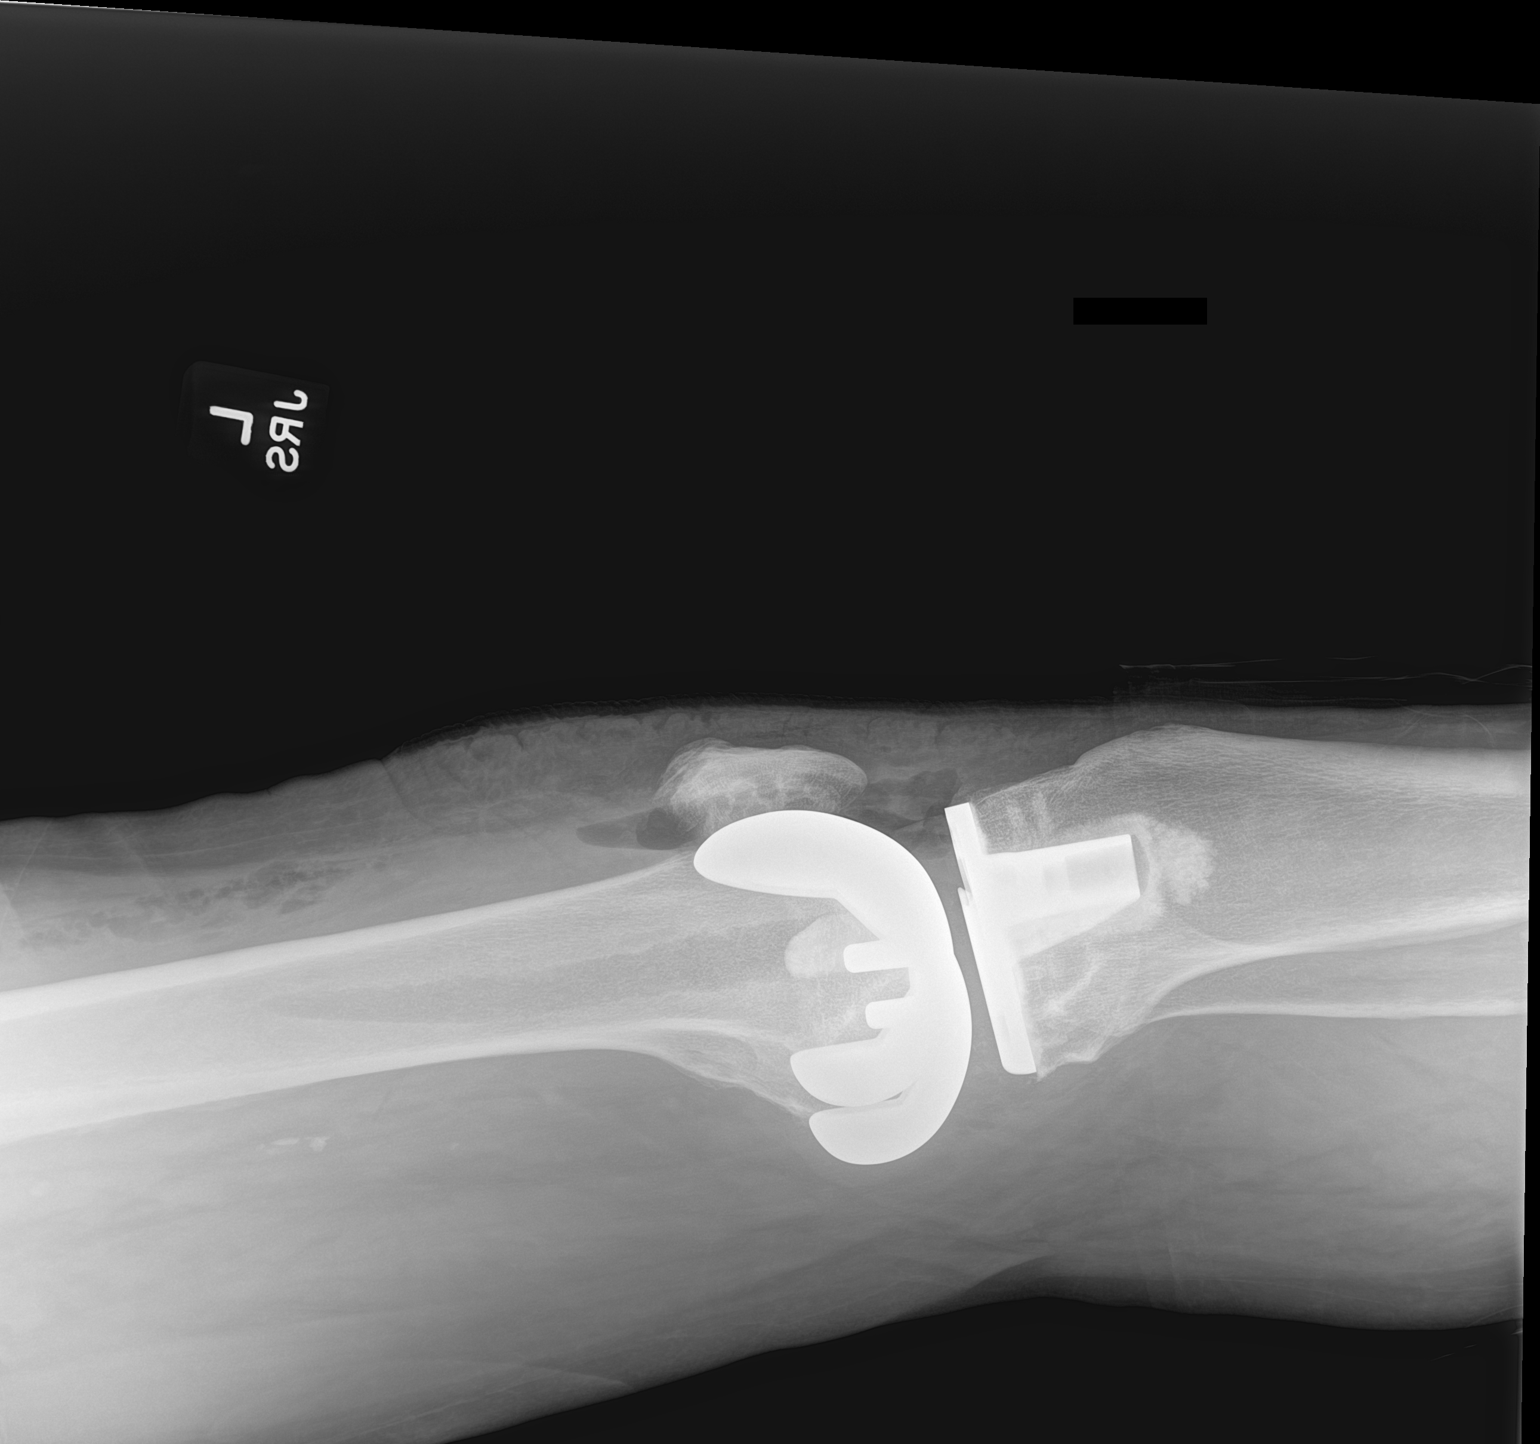

[2 of 2 positions shown; findings below may reference images not displayed]

FINDINGS: Interval total left knee arthroplasty. No perihardware lucency is
seen to indicate hardware failure or loosening. Expected
postoperative changes including subcutaneous air and mild soft
tissue swelling. Mild vascular calcifications. No acute fracture or
dislocation.
IMPRESSION: Status post total left knee arthroplasty without evidence of
hardware failure.

## 2023-04-24 ENCOUNTER — Encounter: Payer: Self-pay | Admitting: Adult Health

## 2023-04-24 ENCOUNTER — Ambulatory Visit: Payer: Medicare HMO | Attending: Adult Health | Admitting: Adult Health

## 2023-04-24 VITALS — BP 122/80 | HR 77 | Ht 69.0 in | Wt 247.0 lb

## 2023-04-24 DIAGNOSIS — I480 Paroxysmal atrial fibrillation: Secondary | ICD-10-CM | POA: Diagnosis not present

## 2023-04-24 DIAGNOSIS — I10 Essential (primary) hypertension: Secondary | ICD-10-CM

## 2023-04-24 NOTE — Patient Instructions (Addendum)
 Medication Instructions:  NO CHANGES    Lab Work: NONE    Testing/Procedures: Your physician has requested that you have an echocardiogram. Echocardiography is a painless test that uses sound waves to create images of your heart. It provides your doctor with information about the size and shape of your heart and how well your heart's chambers and valves are working. This procedure takes approximately one hour. There are no restrictions for this procedure. Please do NOT wear cologne, perfume, aftershave, or lotions (deodorant is allowed). Please arrive 15 minutes prior to your appointment time.  Please note: We ask at that you not bring children with you during ultrasound (echo/ vascular) testing. Due to room size and safety concerns, children are not allowed in the ultrasound rooms during exams. Our front office staff cannot provide observation of children in our lobby area while testing is being conducted. An adult accompanying a patient to their appointment will only be allowed in the ultrasound room at the discretion of the ultrasound technician under special circumstances. We apologize for any inconvenience.    Follow-Up: At [facility], you and your health needs are our priority.  As part of our continuing mission to provide you with exceptional heart care, we have created designated Provider Care Teams.  These Care Teams include your primary Cardiologist (physician) and Advanced Practice Providers (APPs -  Physician Assistants and Nurse Practitioners) who all work together to provide you with the care you need, when you need it.    Your next appointment:   KEEP SCHEDULE APPOINTMENT WITH DR. JORDAN.   Provider:   Peter Jordan, MD

## 2023-04-27 DIAGNOSIS — H401131 Primary open-angle glaucoma, bilateral, mild stage: Secondary | ICD-10-CM | POA: Diagnosis not present

## 2023-04-27 DIAGNOSIS — H0288A Meibomian gland dysfunction right eye, upper and lower eyelids: Secondary | ICD-10-CM | POA: Diagnosis not present

## 2023-04-27 DIAGNOSIS — H0288B Meibomian gland dysfunction left eye, upper and lower eyelids: Secondary | ICD-10-CM | POA: Diagnosis not present

## 2023-04-28 ENCOUNTER — Telehealth: Payer: Self-pay | Admitting: Cardiology

## 2023-04-28 NOTE — Telephone Encounter (Signed)
 Called and spoke to patient. Patient states he went to get his Xarelto  and it was $ 134 for 30 day supply. He stated he could not afford that much. Patient asked if there is a cheaper medication. Advised patient to call his insurance company to see about getting on a payment plan. Also mailed patient a 30 day free trial card. We did not have samples in the office at this time.

## 2023-04-28 NOTE — Telephone Encounter (Signed)
 Pt c/o medication issue:  1. Name of Medication: rivaroxaban  (XARELTO ) 20 MG TABS tablet   2. How are you currently taking this medication (dosage and times per day)?   3. Are you having a reaction (difficulty breathing--STAT)?   4. What is your medication issue? Patient is requesting call back to see about other options for this medication since the price is higher than he wants to pay. Please advise.

## 2023-05-01 ENCOUNTER — Other Ambulatory Visit: Payer: Self-pay

## 2023-05-01 ENCOUNTER — Ambulatory Visit: Payer: Medicare HMO | Admitting: Internal Medicine

## 2023-05-01 VITALS — BP 143/77 | HR 85 | Resp 16 | Ht 69.0 in | Wt 247.0 lb

## 2023-05-01 DIAGNOSIS — R102 Pelvic and perineal pain: Secondary | ICD-10-CM | POA: Diagnosis not present

## 2023-05-01 DIAGNOSIS — T8450XD Infection and inflammatory reaction due to unspecified internal joint prosthesis, subsequent encounter: Secondary | ICD-10-CM

## 2023-05-01 DIAGNOSIS — T8453XD Infection and inflammatory reaction due to internal right knee prosthesis, subsequent encounter: Secondary | ICD-10-CM | POA: Diagnosis not present

## 2023-05-01 NOTE — Patient Instructions (Signed)
 Your knee doing well, will continue cefadroxil  500 mg twice a day   Urine/ultrasound today to workup for your discomfort in private area I question if this could be hydrocele and the ultrasound would show it. Low suspicion for infection but will get urine still Discuss with your urologist as well  See me in 4 weeks to wrap things up with the perineum/testicular issue If fever, chill, increased pain/trouble with urination please let me and urology know sooner

## 2023-05-01 NOTE — Progress Notes (Signed)
 [facility]  CHIEF COMPLAINT:    Follow up for knee infection  SUBJECTIVE:    Caleb Barton is a 83 y.o. male with PMHx as below who presents to the clinic for knee infection.   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  Status post DAIR with orthopedic surgery 07/20/22 with OR cultures that isolated GAS as well.  He was discharged on IV penicillin  via PICC line through 08/31/22 to complete 6 weeks of IV therapy.  He was discharged to SNF and reports doing well.  He is planning to go home on Thursday.    Additionally, as part of his admission patient was found to have elevated LFTs and found to have positive hepatitis B surface Ag.  He reports no prior hepatitis B history.  LFTs in March 2023 were normalized.   Please see A&P for the details of today's visit and status of the patient's medical problems.   ------------------ 08/29/22 id clinic f/u Doing well Intermittent pain right knee 2/10 at most Doesn't like iv med No n/v/diarrhea No f/c No rash Hb a little low but no blood per rectum or exertional dyspnea  ------------- 05/01/23 id clinic f/u Knee doing well no sx. Went down to cefadroxil  500 bid 12/2022 Hx prostate cancer s/p brachytherapy The last few weeks testicular/perineal discomfort No f/c/malaise,dysuria, hematuria, urgency/frequency, rectal pain No trauma to perineal area No lower back pain   Patient's Medications  New Prescriptions   No medications on file  Previous Medications   ASCORBIC ACID  (VITAMIN C ) 1000 MG TABLET    Take 1,000 mg by mouth daily.   ATORVASTATIN  (LIPITOR) 40 MG TABLET    Take 40 mg by mouth at bedtime.   BUDESONIDE-FORMOTEROL (SYMBICORT) 160-4.5 MCG/ACT INHALER    Inhale 2 puffs into the lungs 2 (two) times daily.   CEFADROXIL  (DURICEF) 500 MG CAPSULE    Take 500 mg by mouth 2 (two) times daily.   DORZOLAMIDE -TIMOLOL   (COSOPT ) 22.3-6.8 MG/ML OPHTHALMIC SOLUTION    Place 1 drop into both eyes 2 (two) times daily.    FUROSEMIDE  (LASIX ) 20 MG TABLET    Take 1 tablet (20 mg total) by mouth as needed for fluid or edema.   HYDROCHLOROTHIAZIDE  (HYDRODIURIL ) 25 MG TABLET    Take 25 mg by mouth daily.   LATANOPROST  (XALATAN ) 0.005 % OPHTHALMIC SOLUTION    Place 1 drop into both eyes at bedtime.    METHOCARBAMOL  (ROBAXIN ) 500 MG TABLET    Take 500 mg by mouth See admin instructions. Take 500 mg by mouth every 4-6 hours as needed for muscle spasms   MULTIPLE VITAMINS-MINERALS (CENTRUM SILVER 50+MEN) TABS    Take 1 tablet by mouth at bedtime.   OLMESARTAN  (BENICAR ) 40 MG TABLET    Take 1 tablet (40 mg total) by mouth daily.   OXYCODONE  (OXY IR/ROXICODONE ) 5 MG IMMEDIATE RELEASE TABLET    Take 1 tablet (5 mg total) by mouth every 4 (four) hours as needed for moderate pain.   POLYETHYLENE GLYCOL (MIRALAX  / GLYCOLAX ) 17 G PACKET    Take 17 g by mouth daily as needed for mild constipation.   RIVAROXABAN  (XARELTO ) 20 MG TABS TABLET    Take 1 tablet (20 mg total) by mouth daily with supper.   TAMSULOSIN  (FLOMAX ) 0.4 MG CAPS CAPSULE    Take 0.4 mg by mouth at bedtime.  TYLENOL  500 MG TABLET    Take 500-1,000 mg by mouth every 6 (six) hours as needed for mild pain or headache.  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   Dyspnea    Dysrhythmia    RBBB   Hypercholesteremia    takes Lipitor daily and niacin  daily   Hypertension    takes HCTZ daily and losartan  daily   Prostate cancer (HCC)    Right knee DJD    Sleep apnea    cpap;sleep study done at home;request report from dr.gates   Stroke [facility])    TIA 15 yrs ago    Social History   Tobacco Use   Smoking status: Former    Current packs/day: 1.00    Average packs/day: 1 pack/day for 20.0 years (20.0 ttl  pk-yrs)    Types: Cigarettes    Passive exposure: Never   Smokeless tobacco: Never   Tobacco comments:    quit 35 yrs ago  Vaping Use   Vaping status: Never Used  Substance Use Topics   Alcohol  use: Yes    Comment: social   Drug use: No    Family History  Problem Relation Age of Onset   Stroke Mother    Breast cancer Mother    Bladder Cancer Brother    Breast cancer Other    Prostate cancer Neg Hx    Pancreatic cancer Neg Hx     Allergies  Allergen Reactions   Lisinopril Cough   Sulfa Antibiotics Rash    Review of Systems  All other systems reviewed and are negative.    OBJECTIVE:    There were no vitals filed for this visit.   There is no height or weight on file to calculate BMI.  Physical exam: General/constitutional: no distress, pleasant HEENT: Normocephalic, PER, Conj Clear, EOMI, Oropharynx clear Neck supple CV: rrr no mrg Lungs: clear to auscultation, normal respiratory effort Abd: Soft, Nontender Ext: no edema Skin: No Rash Neuro: nonfocal MSK: bilateral knee replacement scar -- full rom without tenderness; no swelling   Gu: no tenderness or swollen testicles; perineal area no fluctuance/increased warmth/tenderness   Labs and Microbiology:    Component Value Date/Time   CRP <3.0 11/06/2022 0908   CRP <3.0 10/02/2022 1000       Latest Ref Rng & Units 11/06/2022    9:08 AM 10/02/2022   10:00 AM 07/23/2022    4:29 AM  CBC  WBC 3.8 - 10.8 Thousand/uL 5.1  4.1  6.4   Hemoglobin 13.2 - 17.1 g/dL 86.3  86.8  87.4   Hematocrit 38.5 - 50.0 % 41.9  39.9  37.7   Platelets 140 - 400 Thousand/uL 179  215  217       Latest Ref Rng & Units 12/31/2022    9:08 AM 11/06/2022    9:08 AM 10/27/2022    9:45 AM  CMP  Glucose 70 - 99 mg/dL 86  78  81   BUN 8 - 27 mg/dL 15  11  9    Creatinine 0.76 - 1.27 mg/dL 9.25  9.12  9.20   Sodium 134 - 144 mmol/L 142  142  145   Potassium 3.5 - 5.2 mmol/L 3.5  3.8  4.0   Chloride 96 - 106 mmol/L 102  109  104  CO2 20 - 29 mmol/L 29  26  27    Calcium  8.6 - 10.2 mg/dL 9.5  9.3  9.3   Total Protein 6.1 - 8.1 g/dL  6.1    Total Bilirubin 0.2 - 1.2 mg/dL  1.0    AST 10 - 35 U/L  22    ALT 9 - 46 U/L  20       No results found for this or any previous visit (from the past 240 hours).     Component Value Date/Time   CRP <3.0 11/06/2022 0908   CRP <3.0 10/02/2022 1000      ASSESSMENT & PLAN:      S/p dair for GAS bsi/prosthetic right knee S/p 6 weeks pcn g iv On cefadroxil  chronic suppression since 08/31/22   Planned at least 6-12 months; tx dose 1000 mg bid for 3 months then potentially 500 mg bid there after if the knee looks good    ------------- 10/02/22 id clinic assessment Right knee bothers him getting in and out of truck/up-down stairs. He is doing pt/ot. Hurts when he goes up and down stairs can be up to 5/10. It's been the same since last visit He hasn't seen ortho in follow since post-op follow up Tolerating cefadroxil  1000 -- no side effect Labs today  If crp is normal x3, will decrease dose to 500 mg twice a day with cefadroxil  See me mid to end of July  11/06/22 id clinic assessment Similar clinical status with the right knee as previous visit.  Tolerating cefadroxil  1 gram bid Crp normal 10/02/22 -- will get another crp today and if clinical status remains the same by another 6-8 weeks visit will transition to 500 mg bid cefadroxil  for chronic suppression  Elevated bp. Advise monitoring at home and discussing with pcp if continue to be >140s/90s  Venous stasis. Said lasix  doesn't seem to work any more. Advise him to try compression stocking and using lasix  prn only for acute worsening. No hx chf. F/u pcp   01/16/23 id assessment Reviewed crp all normal Clinically stable without increased pain/swelling and appeared infection well suppressed  Will go down to cefadroxil  500 mg po bid starting today  F/u 3 months and recheck crp at that time  05/01/23 id  assessment Knee seems to be doing well on lower dose Crp, cbc, cmp today and will keep on cefadroxil  500 mg po bid Perineal/testicular subjective discomfort. No sign of uti and low suspicion for prostatitis ?hydrocele vs referred pain from intrapelvis pathology Will start with ua and scrotal u/s.  If negative and continues to have pain could consider pelvis imaging Advise him to discuss this with urology as well  F/u 4 weeks   Constance ONEIDA Overton Altamease [facility] 05/01/2023, 9:05 AM

## 2023-05-02 LAB — CBC WITH DIFFERENTIAL/PLATELET
Absolute Lymphocytes: 1400 {cells}/uL (ref 850–3900)
Absolute Monocytes: 700 {cells}/uL (ref 200–950)
Basophils Absolute: 50 {cells}/uL (ref 0–200)
Basophils Relative: 0.9 %
Eosinophils Absolute: 392 {cells}/uL (ref 15–500)
Eosinophils Relative: 7 %
HCT: 46 % (ref 38.5–50.0)
Hemoglobin: 15.4 g/dL (ref 13.2–17.1)
MCH: 32.6 pg (ref 27.0–33.0)
MCHC: 33.5 g/dL (ref 32.0–36.0)
MCV: 97.3 fL (ref 80.0–100.0)
MPV: 10.8 fL (ref 7.5–12.5)
Monocytes Relative: 12.5 %
Neutro Abs: 3058 {cells}/uL (ref 1500–7800)
Neutrophils Relative %: 54.6 %
Platelets: 210 10*3/uL (ref 140–400)
RBC: 4.73 10*6/uL (ref 4.20–5.80)
RDW: 12.8 % (ref 11.0–15.0)
Total Lymphocyte: 25 %
WBC: 5.6 10*3/uL (ref 3.8–10.8)

## 2023-05-02 LAB — COMPREHENSIVE METABOLIC PANEL
AG Ratio: 2.9 (calc) — ABNORMAL HIGH (ref 1.0–2.5)
ALT: 25 U/L (ref 9–46)
AST: 23 U/L (ref 10–35)
Albumin: 4.6 g/dL (ref 3.6–5.1)
Alkaline phosphatase (APISO): 86 U/L (ref 35–144)
BUN: 12 mg/dL (ref 7–25)
CO2: 29 mmol/L (ref 20–32)
Calcium: 9.5 mg/dL (ref 8.6–10.3)
Chloride: 104 mmol/L (ref 98–110)
Creat: 0.92 mg/dL (ref 0.70–1.22)
Globulin: 1.6 g/dL — ABNORMAL LOW (ref 1.9–3.7)
Glucose, Bld: 85 mg/dL (ref 65–99)
Potassium: 3.7 mmol/L (ref 3.5–5.3)
Sodium: 143 mmol/L (ref 135–146)
Total Bilirubin: 1.5 mg/dL — ABNORMAL HIGH (ref 0.2–1.2)
Total Protein: 6.2 g/dL (ref 6.1–8.1)

## 2023-05-02 LAB — C-REACTIVE PROTEIN: CRP: <3 mg/L (ref <8.0)

## 2023-05-02 LAB — URINALYSIS, ROUTINE W REFLEX MICROSCOPIC

## 2023-05-05 ENCOUNTER — Ambulatory Visit (HOSPITAL_COMMUNITY)
Admission: RE | Admit: 2023-05-05 | Discharge: 2023-05-05 | Disposition: A | Discharge status: Home / Self Care | Payer: Medicare HMO | Source: Ambulatory Visit | Attending: Internal Medicine | Admitting: Internal Medicine

## 2023-05-05 DIAGNOSIS — R102 Pelvic and perineal pain: Secondary | ICD-10-CM | POA: Diagnosis not present

## 2023-05-05 DIAGNOSIS — N433 Hydrocele, unspecified: Secondary | ICD-10-CM | POA: Diagnosis not present

## 2023-05-05 DIAGNOSIS — I861 Scrotal varices: Secondary | ICD-10-CM | POA: Diagnosis not present

## 2023-05-05 DIAGNOSIS — N503 Cyst of epididymis: Secondary | ICD-10-CM | POA: Diagnosis not present

## 2023-05-11 ENCOUNTER — Encounter: Payer: Self-pay | Admitting: Internal Medicine

## 2023-05-21 ENCOUNTER — Other Ambulatory Visit (HOSPITAL_COMMUNITY): Payer: Medicare HMO

## 2023-05-29 ENCOUNTER — Other Ambulatory Visit: Payer: Self-pay

## 2023-05-29 ENCOUNTER — Ambulatory Visit (INDEPENDENT_AMBULATORY_CARE_PROVIDER_SITE_OTHER): Payer: Medicare HMO | Admitting: Internal Medicine

## 2023-05-29 ENCOUNTER — Encounter: Payer: Self-pay | Admitting: Internal Medicine

## 2023-05-29 VITALS — BP 153/77 | HR 58 | Temp 97.7°F | Wt 250.0 lb

## 2023-05-29 DIAGNOSIS — T8453XD Infection and inflammatory reaction due to internal right knee prosthesis, subsequent encounter: Secondary | ICD-10-CM | POA: Diagnosis not present

## 2023-05-29 DIAGNOSIS — R102 Pelvic and perineal pain: Secondary | ICD-10-CM

## 2023-05-29 DIAGNOSIS — T8450XD Infection and inflammatory reaction due to unspecified internal joint prosthesis, subsequent encounter: Secondary | ICD-10-CM

## 2023-05-29 NOTE — Patient Instructions (Signed)
 For cefadroxil  please take 1 tablet 500 mg twice a day   Follow up with urology for your testicular discomfort   Follow up with me in 6 months   No labs today

## 2023-05-29 NOTE — Progress Notes (Signed)
 [facility]  CHIEF COMPLAINT:    Follow up for knee infection  SUBJECTIVE:    Caleb Barton is a 83 y.o. male with PMHx as below who presents to the clinic for knee infection.   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  Status post DAIR with orthopedic surgery 07/20/22 with OR cultures that isolated GAS as well.  He was discharged on IV penicillin  via PICC line through 08/31/22 to complete 6 weeks of IV therapy.  He was discharged to SNF and reports doing well.  He is planning to go home on Thursday.    Additionally, as part of his admission patient was found to have elevated LFTs and found to have positive hepatitis B surface Ag.  He reports no prior hepatitis B history.  LFTs in March 2023 were normalized.   Please see A&P for the details of today's visit and status of the patient's medical problems.   ------------------ 08/29/22 id clinic f/u Doing well Intermittent pain right knee 2/10 at most Doesn't like iv med No n/v/diarrhea No f/c No rash Hb a little low but no blood per rectum or exertional dyspnea  ------------- 05/01/23 id clinic f/u Knee doing well no sx. Went down to cefadroxil  500 bid 12/2022 Hx prostate cancer s/p brachytherapy The last few weeks testicular/perineal discomfort No f/c/malaise,dysuria, hematuria, urgency/frequency, rectal pain No trauma to perineal area No lower back pain   05/29/23 id clinic f/u See a&p for detail  Patient's Medications  New Prescriptions   No medications on file  Previous Medications   ASCORBIC ACID  (VITAMIN C ) 1000 MG TABLET    Take 1,000 mg by mouth daily.   ATORVASTATIN  (LIPITOR) 40 MG TABLET    Take 40 mg by mouth at bedtime.   BUDESONIDE-FORMOTEROL (SYMBICORT) 160-4.5 MCG/ACT INHALER    Inhale 2 puffs into the lungs 2 (two) times daily.   CEFADROXIL  (DURICEF) 500 MG CAPSULE    Take 500 mg by mouth  2 (two) times daily.   DORZOLAMIDE -TIMOLOL  (COSOPT ) 22.3-6.8 MG/ML OPHTHALMIC SOLUTION    Place 1 drop into both eyes 2 (two) times daily.    FUROSEMIDE  (LASIX ) 20 MG TABLET    Take 1 tablet (20 mg total) by mouth as needed for fluid or edema.   HYDROCHLOROTHIAZIDE  (HYDRODIURIL ) 25 MG TABLET    Take 25 mg by mouth daily.   LATANOPROST  (XALATAN ) 0.005 % OPHTHALMIC SOLUTION    Place 1 drop into both eyes at bedtime.    METHOCARBAMOL  (ROBAXIN ) 500 MG TABLET    Take 500 mg by mouth See admin instructions. Take 500 mg by mouth every 4-6 hours as needed for muscle spasms   MULTIPLE VITAMINS-MINERALS (CENTRUM SILVER 50+MEN) TABS    Take 1 tablet by mouth at bedtime.   OLMESARTAN  (BENICAR ) 40 MG TABLET    Take 1 tablet (40 mg total) by mouth daily.   OXYCODONE  (OXY IR/ROXICODONE ) 5 MG IMMEDIATE RELEASE TABLET    Take 1 tablet (5 mg total) by mouth every 4 (four) hours as needed for moderate pain.   POLYETHYLENE GLYCOL (MIRALAX  / GLYCOLAX ) 17 G PACKET    Take 17 g by mouth daily as needed for mild constipation.   RIVAROXABAN  (XARELTO ) 20 MG TABS TABLET    Take 1 tablet (20 mg total) by mouth daily with supper.   TAMSULOSIN  (FLOMAX ) 0.4 MG CAPS CAPSULE  Take 0.4 mg by mouth at bedtime.   TYLENOL  500 MG TABLET    Take 500-1,000 mg by mouth every 6 (six) hours as needed for mild pain or headache.  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   Dyspnea    Dysrhythmia    RBBB   Hypercholesteremia    takes Lipitor daily and niacin  daily   Hypertension    takes HCTZ daily and losartan  daily   Prostate cancer (HCC)    Right knee DJD    Sleep apnea    cpap;sleep study done at home;request report from dr.gates   Stroke [facility])    TIA 15 yrs ago    Social History   Tobacco Use   Smoking status: Former    Current packs/day: 1.00    Average  packs/day: 1 pack/day for 20.0 years (20.0 ttl pk-yrs)    Types: Cigarettes    Passive exposure: Never   Smokeless tobacco: Never   Tobacco comments:    quit 35 yrs ago  Vaping Use   Vaping status: Never Used  Substance Use Topics   Alcohol  use: Yes    Comment: social   Drug use: No    Family History  Problem Relation Age of Onset   Stroke Mother    Breast cancer Mother    Bladder Cancer Brother    Breast cancer Other    Prostate cancer Neg Hx    Pancreatic cancer Neg Hx     Allergies  Allergen Reactions   Lisinopril Cough   Sulfa Antibiotics Rash    Review of Systems  All other systems reviewed and are negative.    OBJECTIVE:    Vitals:   05/29/23 0843  BP: (!) 153/77  Pulse: (!) 58  Temp: 97.7 F (36.5 C)  TempSrc: Oral  SpO2: 97%  Weight: 250 lb (113.4 kg)     Body mass index is 36.92 kg/m.  Physical exam: General/constitutional: no distress, pleasant HEENT: Normocephalic, PER, Conj Clear, EOMI, Oropharynx clear Neck supple CV: rrr no mrg Lungs: clear to auscultation, normal respiratory effort Abd: Soft, Nontender MSK: bilateral knee replacement scar -- full rom without tenderness; no swelling   Gu: no tenderness or swollen testicles; perineal area no fluctuance/increased warmth/tenderness   Labs and Microbiology:    Component Value Date/Time   CRP <3.0 05/01/2023 0924   CRP <3.0 11/06/2022 0908   CRP <3.0 10/02/2022 1000       Latest Ref Rng & Units 05/01/2023    9:24 AM 11/06/2022    9:08 AM 10/02/2022   10:00 AM  CBC  WBC 3.8 - 10.8 Thousand/uL 5.6  5.1  4.1   Hemoglobin 13.2 - 17.1 g/dL 84.5  86.3  86.8   Hematocrit 38.5 - 50.0 % 46.0  41.9  39.9   Platelets 140 - 400 Thousand/uL 210  179  215       Latest Ref Rng & Units 05/01/2023    9:24 AM 12/31/2022    9:08 AM 11/06/2022    9:08 AM  CMP  Glucose 65 - 99 mg/dL 85  86  78   BUN 7 - 25 mg/dL 12  15  11    Creatinine 0.70 - 1.22 mg/dL 9.07  9.25  9.12   Sodium 135 - 146  mmol/L 143  142  142   Potassium 3.5 - 5.3 mmol/L 3.7  3.5  3.8   Chloride 98 - 110 mmol/L 104  102  109   CO2 20 - 32 mmol/L 29  29  26    Calcium  8.6 - 10.3 mg/dL 9.5  9.5  9.3   Total Protein 6.1 - 8.1 g/dL 6.2   6.1   Total Bilirubin 0.2 - 1.2 mg/dL 1.5   1.0   AST 10 - 35 U/L 23   22   ALT 9 - 46 U/L 25   20      No results found for this or any previous visit (from the past 240 hours).     Component Value Date/Time   CRP <3.0 05/01/2023 0924   CRP <3.0 11/06/2022 0908   CRP <3.0 10/02/2022 1000     Imaging: Reviewed  05/04/13 doppler u/s scrotum 1. Heterogeneous right testicle. No mass. Left intratesticular cyst.   2.  Bilateral epididymal cysts.   3.  Small bilateral hydroceles and varicoceles.   4.  Normal spectral Doppler evaluation of the testicles.   Thank you for allowing us  to assist in the care of this patient.    ASSESSMENT & PLAN:      S/p dair for GAS bsi/prosthetic right knee S/p 6 weeks pcn g iv On cefadroxil  chronic suppression since 08/31/22   Planned at least 6-12 months; tx dose 1000 mg bid for 3 months then potentially 500 mg bid there after if the knee looks good    ------------- 10/02/22 id clinic assessment Right knee bothers him getting in and out of truck/up-down stairs. He is doing pt/ot. Hurts when he goes up and down stairs can be up to 5/10. It's been the same since last visit He hasn't seen ortho in follow since post-op follow up Tolerating cefadroxil  1000 -- no side effect Labs today  If crp is normal x3, will decrease dose to 500 mg twice a day with cefadroxil  See me mid to end of July  11/06/22 id clinic assessment Similar clinical status with the right knee as previous visit.  Tolerating cefadroxil  1 gram bid Crp normal 10/02/22 -- will get another crp today and if clinical status remains the same by another 6-8 weeks visit will transition to 500 mg bid cefadroxil  for chronic suppression  Elevated bp. Advise monitoring  at home and discussing with pcp if continue to be >140s/90s  Venous stasis. Said lasix  doesn't seem to work any more. Advise him to try compression stocking and using lasix  prn only for acute worsening. No hx chf. F/u pcp   01/16/23 id assessment Reviewed crp all normal Clinically stable without increased pain/swelling and appeared infection well suppressed  Will go down to cefadroxil  500 mg po bid starting today  F/u 3 months and recheck crp at that time  05/01/23 id assessment Knee seems to be doing well on lower dose Crp, cbc, cmp today and will keep on cefadroxil  500 mg po bid Perineal/testicular subjective discomfort. No sign of uti and low suspicion for prostatitis ?hydrocele vs referred pain from intrapelvis pathology Will start with ua and scrotal u/s.  If negative and continues to have pain could consider pelvis imaging Advise him to discuss this with urology as well  F/u 4 weeks   05/29/23 id clinic assessment Knee infection well controlled on suppressive abx Perineal/testicular discomfort and u/s doppler reviewed small hydrocele/varicocele. He is wearing a support sling of some sort and this helps. No fever/chill/urinary sx otherwise  Advise f/u urology regarding u/s finding   Otherwise can f/u for monitoring of knee infection/abx in 6 months   Caleb Barton for Infectious Disease [facility] 05/29/2023, 8:58 AM

## 2023-06-05 ENCOUNTER — Ambulatory Visit (HOSPITAL_COMMUNITY)
Admission: RE | Admit: 2023-06-05 | Discharge: 2023-06-05 | Disposition: A | Discharge status: Home / Self Care | Payer: Medicare HMO | Source: Ambulatory Visit | Attending: Internal Medicine | Admitting: Internal Medicine

## 2023-06-05 DIAGNOSIS — I480 Paroxysmal atrial fibrillation: Secondary | ICD-10-CM | POA: Diagnosis not present

## 2023-06-05 DIAGNOSIS — I10 Essential (primary) hypertension: Secondary | ICD-10-CM | POA: Insufficient documentation

## 2023-06-05 LAB — ECHOCARDIOGRAM COMPLETE
AR max vel: 3.35 cm2
AV Area VTI: 4.17 cm2
AV Area mean vel: 3.24 cm2
AV Mean grad: 5 mm[Hg]
AV Peak grad: 9.9 mm[Hg]
Ao pk vel: 1.57 m/s
MV M vel: 1.31 m/s
MV Peak grad: 6.9 mm[Hg]
S' Lateral: 4.22 cm

## 2023-06-11 ENCOUNTER — Telehealth: Payer: Self-pay

## 2023-06-11 NOTE — Telephone Encounter (Addendum)
Results viewed by patient via Mychart----- Message from Joni Reining sent at 06/07/2023  1:18 PM EST ----- I have reviewed the echocardiogram report.  Essentially unchanged from prior echo. The aortic root is measured smaller from 42 mm to 40 mm on this report.  Remained in atrial fibrillation. Good heart pumping function. So issues with heart valves that are worrisome. Good report.

## 2023-06-15 DIAGNOSIS — H6502 Acute serous otitis media, left ear: Secondary | ICD-10-CM | POA: Diagnosis not present

## 2023-06-29 NOTE — Progress Notes (Unsigned)
 Cardiology Office Note   Date:  07/01/2023   ID:  Caleb Barton, DOB [birth year], MRN [id number]  PCP:  Shon Hale, MD  Cardiologist:   Sadonna Kotara [place], MD   Chief Complaint  Patient presents with   Shortness of Breath    Patient stated that he gets SOB at times going up/down Steps and has COPD   Atrial Fibrillation       History of Present Illness: Caleb Barton is a 83 y.o. male who is seen for follow up Afib.  He presented to the Afib clinic in May 2022 with new onset AFib. This was found when he was scheduled for colonoscopy. He was asymptomatic. Rate controlled on no therapy- pulse rate 60. He was anticoagulated and underwent DCCV on 10/14/16. He states he remained in NSR for only one day. Echo was unremarkable. Decision was made to continue anticoagulation and leave him in AFib since he was asymptomatic. Holter monitor was done in October 2018 to evaluate bradycardia. It showed no significant symptomatic bradycardia or pauses.   He had a  cardiac cath in 2007 by Dr. Katrinka Blazing. This was done for an abnormal Myoview study. Cardiac cath was normal. Interestingly the patient has no recollection of having this done. He has a history of LAFB/RBBB, HTN, OSA on CPAP, and remote thalamic CVA in 2006. Also history of HLD.   He was seen in February 2021 with symptoms of dizziness and dyspnea. Echo showed normal LV function, moderate biatrial enlargement, aortic dilation. Myoview was done showing a large inferior defect. 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   More recent Echo 06/05/23 showed no change from prior. Aortic dimension 40 mm. Normal LV function. Normal valves.    On follow up today he continues to feel well. Recently moved and did all the packing and moving himself. Mild SOB. Uses CPAP regularly. No chest pain, edema, palpitations, dizziness. No bleeding.   Past Medical History:  Diagnosis Date   Cancer (HCC)    precancerous skin cells  reomved from face   Dyspnea    Dysrhythmia    RBBB   Hypercholesteremia    takes Lipitor daily and niacin daily   Hypertension    takes HCTZ daily and losartan daily   Prostate cancer (HCC)    Right knee DJD    Sleep apnea    cpap;sleep study done at home;request report from dr.gates   Stroke [facility])    TIA 15 yrs ago    Past Surgical History:  Procedure Laterality Date   CARDIOVERSION N/A 10/14/2016   Procedure: CARDIOVERSION;  Surgeon: Laurey Morale, MD;  Location: [facility] ENDOSCOPY;  Service: Cardiovascular;  Laterality: N/A;   cataracts both eyes     ioc lens   COLONOSCOPY     with polyps   COLONOSCOPY  08/20/2011   Procedure: COLONOSCOPY;  Surgeon: Vertell Novak., MD;  Location: [facility] ENDOSCOPY;  Service: Endoscopy;  Laterality: N/A;   COLONOSCOPY WITH PROPOFOL N/A 12/17/2016   Procedure: COLONOSCOPY WITH PROPOFOL;  Surgeon: Carman Ching, MD;  Location: [facility] ENDOSCOPY;  Service: Endoscopy;  Laterality: N/A;   COLONOSCOPY WITH PROPOFOL N/A 07/08/2022   Procedure: COLONOSCOPY WITH PROPOFOL;  Surgeon: Kathi Der, MD;  Location: [facility] ENDOSCOPY;  Service: Gastroenterology;  Laterality: N/A;   EYE SURGERY     laser bilateral eyes   I & D KNEE WITH POLY EXCHANGE Right 07/20/2022   Procedure: IRRIGATION AND DEBRIDEMENT RIGHT KNEE WITH  POLY EXCHANGE;  Surgeon: Joen Laura, MD;  Location: [facility] ORS;  Service: Orthopedics;  Laterality: Right;   KNEE ARTHROSCOPY  x 2 right knee   LEFT HEART CATH AND CORONARY ANGIOGRAPHY N/A 07/29/2019   Procedure: LEFT HEART CATH AND CORONARY ANGIOGRAPHY;  Surgeon: Iran Ouch, MD;  Location: [facility] INVASIVE CV LAB;  Service: Cardiovascular;  Laterality: N/A;   LUMBAR LAMINECTOMY/DECOMPRESSION MICRODISCECTOMY Right 09/09/2012   Procedure: LUMBAR LAMINECTOMY/DECOMPRESSION MICRODISCECTOMY 1 LEVEL;  Surgeon: Tia Alert, MD;  Location: [facility] NEURO ORS;  Service: Neurosurgery;  Laterality: Right;  LUMBAR LAMINECTOMY/DECOMPRESSION MICRODISCECTOMY 1 LEVEL    POLYPECTOMY  07/08/2022   Procedure: POLYPECTOMY;  Surgeon: Kathi Der, MD;  Location: [facility] ENDOSCOPY;  Service: Gastroenterology;;   TONSILLECTOMY  yrs ago   TOTAL KNEE ARTHROPLASTY  02/09/2012   Procedure: TOTAL KNEE ARTHROPLASTY;  Surgeon: Nilda Simmer, MD;  Location: [facility] OR;  Service: Orthopedics;  Laterality: Right;   TOTAL KNEE ARTHROPLASTY Left 07/01/2021   Procedure: TOTAL KNEE ARTHROPLASTY;  Surgeon: Joen Laura, MD;  Location: [facility] ORS;  Service: Orthopedics;  Laterality: Left;     Current Outpatient Medications  Medication Sig Dispense Refill   Ascorbic Acid (VITAMIN C) 1000 MG tablet Take 1,000 mg by mouth daily.     atorvastatin (LIPITOR) 40 MG tablet Take 40 mg by mouth at bedtime.     budesonide-formoterol (SYMBICORT) 160-4.5 MCG/ACT inhaler Inhale 2 puffs into the lungs 2 (two) times daily.     cefadroxil (DURICEF) 500 MG capsule Take 500 mg by mouth 2 (two) times daily.     dorzolamide-timolol (COSOPT) 22.3-6.8 MG/ML ophthalmic solution Place 1 drop into both eyes 2 (two) times daily.      hydrochlorothiazide (HYDRODIURIL) 25 MG tablet Take 25 mg by mouth daily.     latanoprost (XALATAN) 0.005 % ophthalmic solution Place 1 drop into both eyes at bedtime.      methocarbamol (ROBAXIN) 500 MG tablet Take 500 mg by mouth See admin instructions. Take 500 mg by mouth every 4-6 hours as needed for muscle spasms     Multiple Vitamins-Minerals (CENTRUM SILVER 50+MEN) TABS Take 1 tablet by mouth at bedtime.     olmesartan (BENICAR) 40 MG tablet Take 1 tablet (40 mg total) by mouth daily. 90 tablet 3   polyethylene glycol (MIRALAX / GLYCOLAX) 17 g packet Take 17 g by mouth daily as needed for mild constipation. 14 each 0   rivaroxaban (XARELTO) 20 MG TABS tablet Take 1 tablet (20 mg total) by mouth daily with supper. 30 tablet 5   tamsulosin (FLOMAX) 0.4 MG CAPS capsule Take 0.4 mg by mouth at bedtime.     TYLENOL 500 MG tablet Take 500-1,000 mg by mouth every 6 (six)  hours as needed for mild pain or headache.     oxyCODONE (OXY IR/ROXICODONE) 5 MG immediate release tablet Take 1 tablet (5 mg total) by mouth every 4 (four) hours as needed for moderate pain. 7 tablet 0   No current facility-administered medications for this visit.    Allergies:   Lisinopril and Sulfa antibiotics    Social History:  The patient  reports that he has quit smoking. His smoking use included cigarettes. He has a 20 pack-year smoking history. He has never been exposed to tobacco smoke. He has never used smokeless tobacco. He reports current alcohol use. He reports that he does not use drugs.   Family History:  The patient's family history includes Bladder Cancer in his brother; Breast cancer in  his mother and another family member; Stroke in his mother.    ROS:  Please see the history of present illness.   Otherwise, review of systems are positive for none.   All other systems are reviewed and negative.    PHYSICAL EXAM: VS:  BP 132/62   Pulse (!) 59   Ht 5\' 9"  (1.753 m)   Wt 242 lb (109.8 kg)   SpO2 (!) 89%   BMI 35.74 kg/m  , BMI Body mass index is 35.74 kg/m. GENERAL:  Well appearing overweight WM HEENT:  PERRL, EOMI, sclera are clear. Oropharynx is clear. NECK:  No jugular venous distention, carotid upstroke brisk and symmetric, no bruits, no thyromegaly or adenopathy LUNGS:  Clear to auscultation bilaterally CHEST:  Unremarkable HEART:  IRRR,  PMI not displaced or sustained,S1 and S2 within normal limits, no S3, no S4: no clicks, no rubs, no murmurs ABD:  Soft, nontender. BS +, no masses or bruits. No hepatomegaly, no splenomegaly EXT:  2 + pulses throughout, no edema, no cyanosis no clubbing SKIN:  Warm and dry.  No rashes NEURO:  Alert and oriented x 3. Cranial nerves II through XII intact. PSYCH:  Cognitively intact    Recent Labs: 07/22/2022: Magnesium 2.5 05/01/2023: ALT 25; BUN 12; Creat 0.92; Hemoglobin 15.4; Platelets 210; Potassium 3.7; Sodium 143     Lipid Panel No results found for: "CHOL", "TRIG", "HDL", "CHOLHDL", "VLDL", "LDLCALC", "LDLDIRECT"    Wt Readings from Last 3 Encounters:  07/01/23 242 lb (109.8 kg)  05/29/23 250 lb (113.4 kg)  05/01/23 247 lb (112 kg)    Dated 05/09/16: cholesterol 148, triglycerides 108, HDL 44, LDL 82.  Dated 05/21/17: cholesterol 121, triglycerides 81, HDL 34, LDL 71. Chemistry panel normal. Dated 06/10/18: cholesterol 120, triglycerides 138, HDL 37, LDL 56. Hgb and BMET normal Dated 06/15/19: cholesterol 131, triglycerides 93, HDL 43, LDL 71.  Dated 06/18/20: cholesterol 149, triglycerides 132, HDL 45, LDL 80. CMET and CBC normal. Dated 07/03/22: cholesterol 140, triglycerides 112, HDL 46, LDL 74.    Other studies Reviewed: Echo: 09/23/16: Study Conclusions   - Left ventricle: The cavity size was normal. Systolic function was   normal. The estimated ejection fraction was in the range of 55%   to 60%. Wall motion was normal; there were no regional wall   motion abnormalities. - Mitral valve: Calcified annulus. - Left atrium: The atrium was mildly dilated. - Atrial septum: No defect or patent foramen ovale was identified  Holter 02/12/17: Study Highlights   Atrial fibrillation/flutter with controlled ventricular rate. Mean HR 81 bpm Slowest HR of 35 during sleeping hours. Occasional PVCs and rare PVC couplets.     Echo 06/30/19: IMPRESSIONS     1. Left ventricular ejection fraction, by estimation, is 55 to 60%. The  left ventricle has normal function. The left ventricle has no regional  wall motion abnormalities. There is indeterminate left ventricular  hypertrophy. Left ventricular diastolic  parameters are indeterminate.   2. Right ventricular systolic function is normal. The right ventricular  size is normal. Tricuspid regurgitation signal is inadequate for assessing  PA pressure.   3. Left atrial size was moderately dilated.   4. Right atrial size was moderately dilated.   5. The  mitral valve is normal in structure. No evidence of mitral valve  regurgitation. No evidence of mitral stenosis.   6. The aortic valve was not well visualized. Aortic valve regurgitation  is not visualized. No aortic stenosis is present.   7.  Aortic dilatation noted. 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   8. The inferior vena cava is normal in size with greater than 50%  respiratory variability, suggesting right atrial pressure of 3 mmHg.   Myoview 07/19/19: Study Highlights    The left ventricular ejection fraction is mildly decreased (45-54%). Nuclear stress EF: 53%. There was no ST segment deviation noted during stress. Defect 1: There is a large defect of moderate severity present in the basal anteroseptal, basal inferoseptal, basal inferior, basal inferolateral, mid inferoseptal, mid inferior and apical inferior location. This is an intermediate risk study. No prior study for comparison.   EF low normal without specific wall motion abnormalities seen, but there is a large fixed defect in the inferior/inferoseptal walls from base to apex, extending to anteroseptal/inferolateral areas at the base. This does not change significantly with stress imaging. There is no clear diaphragmatic attenuation to explain findings. There are notes re: abnormal myoview with normal cath in 2007, but I cannot see this study to compare. Intermediate risk given size of area, but no ischemia seen.   Cardiac cath 07/29/19:  LEFT HEART CATH AND CORONARY ANGIOGRAPHY  Conclusion    Mid RCA lesion is 30% stenosed. Ost RCA lesion is 20% stenosed. Ost LAD lesion is 20% stenosed. Ramus lesion is 60% stenosed.   1.  Mild nonobstructive coronary artery disease.  Borderline stenosis in ostial ramus. 2.  Left ventricular angiography was not performed.  EF was normal by echo. 3.  Mildly elevated left ventricular end-diastolic pressure at 15 mmHg.    Recommendations: Suspect false positive nuclear stress test. Continue medical therapy.  Echo 06/05/23: IMPRESSIONS     1. Left ventricular ejection fraction, by estimation, is 55 to 60%. The  left ventricle has normal function. Left ventricular endocardial border  not optimally defined to evaluate regional wall motion. The left  ventricular internal cavity size was mildly  dilated. Left ventricular diastolic function could not be evaluated.   2. Right ventricular systolic function is mildly reduced. The right  ventricular size is normal. There is normal pulmonary artery systolic  pressure. The estimated right ventricular systolic pressure is 7.5 mmHg.   3. The mitral valve is grossly normal. Trivial mitral valve  regurgitation. No evidence of mitral stenosis.   4. The aortic valve was not well visualized. Aortic valve regurgitation  is not visualized. No aortic stenosis is present.   5. The inferior vena cava is normal in size with greater than 50%  respiratory variability, suggesting right atrial pressure of 3 mmHg.   Comparison(s): No significant change from prior study.    ASSESSMENT AND PLAN:  1.  Atrial fibrillation persistent/permanent.  Asymptomatic. Rate is controlled. Will continue  anticoagulation with Xarelto.  On no meds for rate control 2. OSA on CPAP. compliant 3. HTN controlled. On ARB and HCTZ 4. Remote CVA.  5. Hypercholesterolemia. On statin. Labs per PCP 6. Nonobstructive CAD   Disposition:   FU with me in one year.   Signed, Marquest Gunkel [place], MD  07/01/2023 8:17 AM    [facility] [address], [place], [place], [address] Phone [phone], Fax [phone]

## 2023-07-01 ENCOUNTER — Ambulatory Visit: Payer: Medicare HMO | Attending: Cardiology | Admitting: Cardiology

## 2023-07-01 ENCOUNTER — Encounter: Payer: Self-pay | Admitting: Cardiology

## 2023-07-01 VITALS — BP 132/62 | HR 59 | Ht 69.0 in | Wt 242.0 lb

## 2023-07-01 DIAGNOSIS — I452 Bifascicular block: Secondary | ICD-10-CM

## 2023-07-01 DIAGNOSIS — I10 Essential (primary) hypertension: Secondary | ICD-10-CM | POA: Diagnosis not present

## 2023-07-01 DIAGNOSIS — I4821 Permanent atrial fibrillation: Secondary | ICD-10-CM | POA: Diagnosis not present

## 2023-07-01 DIAGNOSIS — Z7901 Long term (current) use of anticoagulants: Secondary | ICD-10-CM | POA: Diagnosis not present

## 2023-07-01 NOTE — Patient Instructions (Signed)
 Medication Instructions:  Continue same medications *If you need a refill on your cardiac medications before your next appointment, please call your pharmacy*   Lab Work: None ordered   Testing/Procedures: None ordered   Follow-Up: At [facility], you and your health needs are our priority.  As part of our continuing mission to provide you with exceptional heart care, we have created designated Provider Care Teams.  These Care Teams include your primary Cardiologist (physician) and Advanced Practice Providers (APPs -  Physician Assistants and Nurse Practitioners) who all work together to provide you with the care you need, when you need it.  We recommend signing up for the patient portal called "MyChart".  Sign up information is provided on this After Visit Summary.  MyChart is used to connect with patients for Virtual Visits (Telemedicine).  Patients are able to view lab/test results, encounter notes, upcoming appointments, etc.  Non-urgent messages can be sent to your provider as well.   To learn more about what you can do with MyChart, go to [email or web address].    Your next appointment:  1 year    Call in Nov to schedule March appointment     Provider:  Dr.Jordan

## 2023-07-17 DIAGNOSIS — G4733 Obstructive sleep apnea (adult) (pediatric): Secondary | ICD-10-CM | POA: Diagnosis not present

## 2023-08-06 DIAGNOSIS — R051 Acute cough: Secondary | ICD-10-CM | POA: Diagnosis not present

## 2023-08-06 DIAGNOSIS — J40 Bronchitis, not specified as acute or chronic: Secondary | ICD-10-CM | POA: Diagnosis not present

## 2023-08-06 DIAGNOSIS — R062 Wheezing: Secondary | ICD-10-CM | POA: Diagnosis not present

## 2023-08-13 DIAGNOSIS — M1711 Unilateral primary osteoarthritis, right knee: Secondary | ICD-10-CM | POA: Diagnosis not present

## 2023-08-14 ENCOUNTER — Other Ambulatory Visit: Payer: Self-pay | Admitting: Cardiovascular Disease

## 2023-08-14 DIAGNOSIS — I4821 Permanent atrial fibrillation: Secondary | ICD-10-CM

## 2023-08-14 NOTE — Telephone Encounter (Signed)
 Prescription refill request for Xarelto  received.  Indication:afib Last office visit:3/25 Weight:109.8  kg Age:83 Scr:0.92  1/25 CrCl:99.46  ml/min  Prescription refilled

## 2023-08-18 DIAGNOSIS — R413 Other amnesia: Secondary | ICD-10-CM | POA: Diagnosis not present

## 2023-08-18 DIAGNOSIS — J42 Unspecified chronic bronchitis: Secondary | ICD-10-CM | POA: Diagnosis not present

## 2023-08-19 DIAGNOSIS — K5900 Constipation, unspecified: Secondary | ICD-10-CM | POA: Diagnosis not present

## 2023-08-19 DIAGNOSIS — M62838 Other muscle spasm: Secondary | ICD-10-CM | POA: Diagnosis not present

## 2023-08-19 DIAGNOSIS — M6289 Other specified disorders of muscle: Secondary | ICD-10-CM | POA: Diagnosis not present

## 2023-08-19 DIAGNOSIS — N50819 Testicular pain, unspecified: Secondary | ICD-10-CM | POA: Diagnosis not present

## 2023-08-19 DIAGNOSIS — M6281 Muscle weakness (generalized): Secondary | ICD-10-CM | POA: Diagnosis not present

## 2023-08-27 ENCOUNTER — Encounter (HOSPITAL_BASED_OUTPATIENT_CLINIC_OR_DEPARTMENT_OTHER): Payer: Self-pay

## 2023-08-31 DIAGNOSIS — N50812 Left testicular pain: Secondary | ICD-10-CM | POA: Diagnosis not present

## 2023-09-22 DIAGNOSIS — K5900 Constipation, unspecified: Secondary | ICD-10-CM | POA: Diagnosis not present

## 2023-09-22 DIAGNOSIS — N50819 Testicular pain, unspecified: Secondary | ICD-10-CM | POA: Diagnosis not present

## 2023-09-22 DIAGNOSIS — M62838 Other muscle spasm: Secondary | ICD-10-CM | POA: Diagnosis not present

## 2023-09-22 DIAGNOSIS — M6289 Other specified disorders of muscle: Secondary | ICD-10-CM | POA: Diagnosis not present

## 2023-09-22 DIAGNOSIS — M6281 Muscle weakness (generalized): Secondary | ICD-10-CM | POA: Diagnosis not present

## 2023-09-25 DIAGNOSIS — M5416 Radiculopathy, lumbar region: Secondary | ICD-10-CM | POA: Diagnosis not present

## 2023-09-25 DIAGNOSIS — Z6836 Body mass index (BMI) 36.0-36.9, adult: Secondary | ICD-10-CM | POA: Diagnosis not present

## 2023-09-28 ENCOUNTER — Ambulatory Visit: Admitting: Internal Medicine

## 2023-10-14 DIAGNOSIS — M6289 Other specified disorders of muscle: Secondary | ICD-10-CM | POA: Diagnosis not present

## 2023-10-14 DIAGNOSIS — M6281 Muscle weakness (generalized): Secondary | ICD-10-CM | POA: Diagnosis not present

## 2023-10-14 DIAGNOSIS — M62838 Other muscle spasm: Secondary | ICD-10-CM | POA: Diagnosis not present

## 2023-10-14 DIAGNOSIS — N50819 Testicular pain, unspecified: Secondary | ICD-10-CM | POA: Diagnosis not present

## 2023-10-14 DIAGNOSIS — K5900 Constipation, unspecified: Secondary | ICD-10-CM | POA: Diagnosis not present

## 2023-10-19 DIAGNOSIS — E669 Obesity, unspecified: Secondary | ICD-10-CM | POA: Diagnosis not present

## 2023-10-19 DIAGNOSIS — I10 Essential (primary) hypertension: Secondary | ICD-10-CM | POA: Diagnosis not present

## 2023-10-19 DIAGNOSIS — C61 Malignant neoplasm of prostate: Secondary | ICD-10-CM | POA: Diagnosis not present

## 2023-10-19 DIAGNOSIS — J42 Unspecified chronic bronchitis: Secondary | ICD-10-CM | POA: Diagnosis not present

## 2023-10-26 DIAGNOSIS — G4733 Obstructive sleep apnea (adult) (pediatric): Secondary | ICD-10-CM | POA: Diagnosis not present

## 2023-10-26 DIAGNOSIS — H0288A Meibomian gland dysfunction right eye, upper and lower eyelids: Secondary | ICD-10-CM | POA: Diagnosis not present

## 2023-10-26 DIAGNOSIS — H0288B Meibomian gland dysfunction left eye, upper and lower eyelids: Secondary | ICD-10-CM | POA: Diagnosis not present

## 2023-10-26 DIAGNOSIS — H401131 Primary open-angle glaucoma, bilateral, mild stage: Secondary | ICD-10-CM | POA: Diagnosis not present

## 2023-10-26 DIAGNOSIS — L980 Pyogenic granuloma: Secondary | ICD-10-CM | POA: Diagnosis not present

## 2023-10-26 DIAGNOSIS — Z85828 Personal history of other malignant neoplasm of skin: Secondary | ICD-10-CM | POA: Diagnosis not present

## 2023-10-26 DIAGNOSIS — L84 Corns and callosities: Secondary | ICD-10-CM | POA: Diagnosis not present

## 2023-11-03 DIAGNOSIS — G4733 Obstructive sleep apnea (adult) (pediatric): Secondary | ICD-10-CM | POA: Diagnosis not present

## 2023-11-10 DIAGNOSIS — H6990 Unspecified Eustachian tube disorder, unspecified ear: Secondary | ICD-10-CM | POA: Diagnosis not present

## 2023-11-12 ENCOUNTER — Other Ambulatory Visit: Payer: Self-pay

## 2023-11-12 ENCOUNTER — Ambulatory Visit: Admitting: Internal Medicine

## 2023-11-12 ENCOUNTER — Encounter: Payer: Self-pay | Admitting: Internal Medicine

## 2023-11-12 VITALS — BP 123/72 | HR 55 | Temp 97.3°F | Ht 69.0 in | Wt 245.0 lb

## 2023-11-12 DIAGNOSIS — Z96659 Presence of unspecified artificial knee joint: Secondary | ICD-10-CM | POA: Diagnosis not present

## 2023-11-12 DIAGNOSIS — H9190 Unspecified hearing loss, unspecified ear: Secondary | ICD-10-CM | POA: Insufficient documentation

## 2023-11-12 DIAGNOSIS — Z860100 Personal history of colon polyps, unspecified: Secondary | ICD-10-CM | POA: Insufficient documentation

## 2023-11-12 DIAGNOSIS — E785 Hyperlipidemia, unspecified: Secondary | ICD-10-CM | POA: Insufficient documentation

## 2023-11-12 DIAGNOSIS — G4733 Obstructive sleep apnea (adult) (pediatric): Secondary | ICD-10-CM | POA: Insufficient documentation

## 2023-11-12 DIAGNOSIS — I714 Abdominal aortic aneurysm, without rupture, unspecified: Secondary | ICD-10-CM | POA: Insufficient documentation

## 2023-11-12 DIAGNOSIS — I739 Peripheral vascular disease, unspecified: Secondary | ICD-10-CM | POA: Insufficient documentation

## 2023-11-12 DIAGNOSIS — I700 Atherosclerosis of aorta: Secondary | ICD-10-CM | POA: Insufficient documentation

## 2023-11-12 DIAGNOSIS — T8459XD Infection and inflammatory reaction due to other internal joint prosthesis, subsequent encounter: Secondary | ICD-10-CM | POA: Diagnosis not present

## 2023-11-12 DIAGNOSIS — N401 Enlarged prostate with lower urinary tract symptoms: Secondary | ICD-10-CM | POA: Insufficient documentation

## 2023-11-12 DIAGNOSIS — D6859 Other primary thrombophilia: Secondary | ICD-10-CM | POA: Insufficient documentation

## 2023-11-12 DIAGNOSIS — I4811 Longstanding persistent atrial fibrillation: Secondary | ICD-10-CM | POA: Insufficient documentation

## 2023-11-12 MED ORDER — CEFADROXIL 500 MG PO CAPS: 500.0000 mg | ORAL_CAPSULE | Freq: Every day | ORAL | 11 refills | Status: AC

## 2023-11-12 NOTE — Patient Instructions (Signed)
 Follow in 6 weeks for labs recheck If any persistent 2-3 days of worsening pain/swelling/redness/fever in the right knee let me know immediately and go to twice a day antibiotics again    Start cephadroxil 500 mg once a day for now

## 2023-11-12 NOTE — Progress Notes (Signed)
 [facility]  CHIEF COMPLAINT:    Follow up for knee infection  SUBJECTIVE:    Caleb Barton is a 83 y.o. male with PMHx as below who presents to the clinic for knee infection.   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  Status post DAIR with orthopedic surgery 07/20/22 with OR cultures that isolated GAS as well.  He was discharged on IV penicillin  via PICC line through 08/31/22 to complete 6 weeks of IV therapy.  He was discharged to SNF and reports doing well.  He is planning to go home on Thursday.    Additionally, as part of his admission patient was found to have elevated LFTs and found to have positive hepatitis B surface Ag.  He reports no prior hepatitis B history.  LFTs in March 2023 were normalized.   Please see A&P for the details of today's visit and status of the patient's medical problems.   ------------------ 08/29/22 id clinic f/u Doing well Intermittent pain right knee 2/10 at most Doesn't like iv med No n/v/diarrhea No f/c No rash Hb a little low but no blood per rectum or exertional dyspnea  ------------- 05/01/23 id clinic f/u Knee doing well no sx. Went down to cefadroxil  500 bid 12/2022 Hx prostate cancer s/p brachytherapy The last few weeks testicular/perineal discomfort No f/c/malaise,dysuria, hematuria, urgency/frequency, rectal pain No trauma to perineal area No lower back pain   11/12/23 id clinic f/u See a&p for detail  Patient's Medications  New Prescriptions   No medications on file  Previous Medications   ASCORBIC ACID  (VITAMIN C ) 1000 MG TABLET    Take 1,000 mg by mouth daily.   ATORVASTATIN  (LIPITOR) 40 MG TABLET    Take 40 mg by mouth at bedtime.   BUDESONIDE-FORMOTEROL (SYMBICORT) 160-4.5 MCG/ACT INHALER    Inhale 2 puffs into the lungs 2 (two) times daily.   CEFADROXIL  (DURICEF) 500 MG CAPSULE    Take 500 mg by  mouth 2 (two) times daily.   CLOBETASOL OINTMENT (TEMOVATE) 0.05 %    SMARTSIG:sparingly Topical Twice Daily   DORZOLAMIDE -TIMOLOL  (COSOPT ) 22.3-6.8 MG/ML OPHTHALMIC SOLUTION    Place 1 drop into both eyes 2 (two) times daily.    FLUTICASONE (FLONASE) 50 MCG/ACT NASAL SPRAY    1 spray in affected side Nasally Once to Twice a day; Duration: 30 days   HYDROCHLOROTHIAZIDE  (HYDRODIURIL ) 25 MG TABLET    Take 25 mg by mouth daily.   LATANOPROST  (XALATAN ) 0.005 % OPHTHALMIC SOLUTION    Place 1 drop into both eyes at bedtime.    METHOCARBAMOL  (ROBAXIN ) 500 MG TABLET    Take 500 mg by mouth See admin instructions. Take 500 mg by mouth every 4-6 hours as needed for muscle spasms   MULTIPLE VITAMINS-MINERALS (CENTRUM SILVER 50+MEN) TABS    Take 1 tablet by mouth at bedtime.   OLMESARTAN  (BENICAR ) 40 MG TABLET    Take 1 tablet (40 mg total) by mouth daily.   POLYETHYLENE GLYCOL (MIRALAX  / GLYCOLAX ) 17 G PACKET    Take 17 g by mouth daily as needed for mild constipation.   RIVAROXABAN  (XARELTO ) 20 MG TABS TABLET    TAKE 1 TABLET BY MOUTH DAILY WITH SUPPER   TAMSULOSIN  (FLOMAX ) 0.4 MG CAPS CAPSULE    Take 0.4 mg by mouth at bedtime.   TYLENOL  500 MG TABLET  Take 500-1,000 mg by mouth every 6 (six) hours as needed for mild pain or headache.  Modified Medications   No medications on file  Discontinued Medications   No medications on file      Past Medical History:  Diagnosis Date  . 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 Dyspnea   . Dysrhythmia    RBBB  . Hypercholesteremia    takes Lipitor daily and niacin  daily  . Hypertension    takes HCTZ daily and losartan  daily  . Prostate cancer (HCC)   . Right knee DJD   . Sleep apnea    cpap;sleep study done at home;request report from dr.gates  . Stroke [facility])    TIA 15 yrs ago    Social History   Tobacco Use  . Smoking status: Former    Current packs/day: 1.00    Average packs/day: 1 pack/day for 20.0 years (20.0 ttl  pk-yrs)    Types: Cigarettes    Passive exposure: Never  . Smokeless tobacco: Never  . Tobacco comments:    quit 35 yrs ago  Vaping Use  . Vaping status: Never Used  Substance Use Topics  . Alcohol  use: Yes    Comment: social  . Drug use: No    Family History  Problem Relation Age of Onset  . Stroke Mother   . Breast cancer Mother   . Bladder Cancer Brother   . Breast cancer Other   . Prostate cancer Neg Hx   . Pancreatic cancer Neg Hx     Allergies  Allergen Reactions  . Lisinopril Cough  . Sulfa Antibiotics Rash    Review of Systems  All other systems reviewed and are negative.    OBJECTIVE:    Vitals:   11/12/23 0849  BP: 123/72  Pulse: (!) 55  Temp: (!) 97.3 F (36.3 C)  TempSrc: Temporal  SpO2: 94%  Weight: 245 lb (111.1 kg)  Height: 5' 9 (1.753 m)     Body mass index is 36.18 kg/m.  Physical exam: General/constitutional: no distress, pleasant HEENT: Normocephalic, PER, Conj Clear, EOMI, Oropharynx clear Neck supple CV: rrr no mrg Lungs: clear to auscultation, normal respiratory effort Abd: Soft, Nontender MSK: bilateral knee replacement scar -- full rom without tenderness; no swelling   Labs and Microbiology:    Component Value Date/Time   CRP <3.0 05/01/2023 0924   CRP <3.0 11/06/2022 0908   CRP <3.0 10/02/2022 1000       Latest Ref Rng & Units 05/01/2023    9:24 AM 11/06/2022    9:08 AM 10/02/2022   10:00 AM  CBC  WBC 3.8 - 10.8 Thousand/uL 5.6  5.1  4.1   Hemoglobin 13.2 - 17.1 g/dL 84.5  86.3  86.8   Hematocrit 38.5 - 50.0 % 46.0  41.9  39.9   Platelets 140 - 400 Thousand/uL 210  179  215       Latest Ref Rng & Units 05/01/2023    9:24 AM 12/31/2022    9:08 AM 11/06/2022    9:08 AM  CMP  Glucose 65 - 99 mg/dL 85  86  78   BUN 7 - 25 mg/dL 12  15  11    Creatinine 0.70 - 1.22 mg/dL 9.07  9.25  9.12   Sodium 135 - 146 mmol/L 143  142  142   Potassium 3.5 - 5.3 mmol/L 3.7  3.5  3.8   Chloride 98 - 110 mmol/L 104  102  109    CO2 20 - 32 mmol/L 29  29  26    Calcium  8.6 - 10.3 mg/dL 9.5  9.5  9.3   Total Protein 6.1 - 8.1 g/dL 6.2   6.1   Total Bilirubin 0.2 - 1.2 mg/dL 1.5   1.0   AST 10 - 35 U/L 23   22   ALT 9 - 46 U/L 25   20      No results found for this or any previous visit (from the past 240 hours).     Component Value Date/Time   CRP <3.0 05/01/2023 0924   CRP <3.0 11/06/2022 0908   CRP <3.0 10/02/2022 1000     Imaging: Reviewed  05/04/13 doppler u/s scrotum 1. Heterogeneous right testicle. No mass. Left intratesticular cyst.   2.  Bilateral epididymal cysts.   3.  Small bilateral hydroceles and varicoceles.   4.  Normal spectral Doppler evaluation of the testicles.   Thank you for allowing us  to assist in the care of this patient.    ASSESSMENT & PLAN:      S/p dair for GAS bsi/prosthetic right knee S/p 6 weeks pcn g iv On cefadroxil  chronic suppression since 08/31/22   Planned at least 6-12 months; tx dose 1000 mg bid for 3 months then potentially 500 mg bid there after if the knee looks good    ------------- 10/02/22 id clinic assessment Right knee bothers him getting in and out of truck/up-down stairs. He is doing pt/ot. Hurts when he goes up and down stairs can be up to 5/10. It's been the same since last visit He hasn't seen ortho in follow since post-op follow up Tolerating cefadroxil  1000 -- no side effect Labs today  If crp is normal x3, will decrease dose to 500 mg twice a day with cefadroxil  See me mid to end of July  11/06/22 id clinic assessment Similar clinical status with the right knee as previous visit.  Tolerating cefadroxil  1 gram bid Crp normal 10/02/22 -- will get another crp today and if clinical status remains the same by another 6-8 weeks visit will transition to 500 mg bid cefadroxil  for chronic suppression  Elevated bp. Advise monitoring at home and discussing with pcp if continue to be >140s/90s  Venous stasis. Said lasix  doesn't seem to work  any more. Advise him to try compression stocking and using lasix  prn only for acute worsening. No hx chf. F/u pcp   01/16/23 id assessment Reviewed crp all normal Clinically stable without increased pain/swelling and appeared infection well suppressed  Will go down to cefadroxil  500 mg po bid starting today  F/u 3 months and recheck crp at that time  05/01/23 id assessment Knee seems to be doing well on lower dose Crp, cbc, cmp today and will keep on cefadroxil  500 mg po bid Perineal/testicular subjective discomfort. No sign of uti and low suspicion for prostatitis ?hydrocele vs referred pain from intrapelvis pathology Will start with ua and scrotal u/s.  If negative and continues to have pain could consider pelvis imaging Advise him to discuss this with urology as well  F/u 4 weeks   05/29/23 id clinic assessment Knee infection well controlled on suppressive abx Perineal/testicular discomfort and u/s doppler reviewed small hydrocele/varicocele. He is wearing a support sling of some sort and this helps. No fever/chill/urinary sx otherwise Advise f/u urology regarding u/s finding   Otherwise can f/u for monitoring of knee infection/abx in 6 months   11/12/23 id  clinic assessment No pain/swelling; knee basically felt normal  Reviewed history: Right knee done 2013 Infected right knee 07/20/2022 -- s/p DAIR. Of note, patient has had symptoms for about 3 weeks before DAIR was done His infection I would classify as acute dair but late hematogenous which carries a rather high failure rate almost 50%  We reviewed DAIR data, and We discussed keeping on suppressive antibiotics indefinitely but will go to once a day cephalexin   Follow in 6 weeks for labs recheck If any persistent 2-3 days of worsening pain/swelling/redness/fever in the right knee let me know immediately and go to twice a day antibiotics again     Constance ONEIDA [facility] 11/12/2023, 9:06 AM

## 2023-11-16 ENCOUNTER — Encounter: Payer: Self-pay | Admitting: Neurology

## 2023-11-16 ENCOUNTER — Ambulatory Visit: Payer: Self-pay | Admitting: Neurology

## 2023-11-16 VITALS — BP 134/78 | HR 60 | Resp 15 | Ht 69.0 in | Wt 246.5 lb

## 2023-11-16 DIAGNOSIS — R419 Unspecified symptoms and signs involving cognitive functions and awareness: Secondary | ICD-10-CM | POA: Diagnosis not present

## 2023-11-16 DIAGNOSIS — D1801 Hemangioma of skin and subcutaneous tissue: Secondary | ICD-10-CM | POA: Diagnosis not present

## 2023-11-16 DIAGNOSIS — Z85828 Personal history of other malignant neoplasm of skin: Secondary | ICD-10-CM | POA: Diagnosis not present

## 2023-11-16 DIAGNOSIS — L980 Pyogenic granuloma: Secondary | ICD-10-CM | POA: Diagnosis not present

## 2023-11-16 DIAGNOSIS — D1809 Hemangioma of other sites: Secondary | ICD-10-CM | POA: Diagnosis not present

## 2023-11-16 DIAGNOSIS — D485 Neoplasm of uncertain behavior of skin: Secondary | ICD-10-CM | POA: Diagnosis not present

## 2023-11-16 NOTE — Progress Notes (Signed)
 [facility]  PATIENT: Caleb Barton DOB: 26-Jan-1941  REQUESTING CLINICIAN: Chrystal Lamarr RAMAN, * HISTORY FROM: Patient  REASON FOR VISIT: Memory loss    HISTORICAL  CHIEF COMPLAINT:  Chief Complaint  Patient presents with   New Patient (Initial Visit)    Rm12, alone,  referral for Memory concerns / Lamarr Chrystal MD [facility] [phone]: moca score 26    HISTORY OF PRESENT ILLNESS:  Discussed the use of AI scribe software for clinical note transcription with the patient, who gave verbal consent to proceed.  Caleb Barton is an 83 year old male with atrial fibrillation, hypertension, hyperlipidemia, Vitamin B12 deficiency,  who presents with concerns about memory loss.  He has concerns about memory loss, primarily noted by his wife. He uses his phone to keep track of appointments and may forget them if not noted. His spouse complains about him forgetting recent conversations or plans. No issues with remembering names of relatives or managing daily activities such as finances or driving.  He has a history of a transient ischemic attack approximately 20 years ago and is currently on Xarelto  for atrial fibrillation. He has undergone cardioversion, but his atrial fibrillation returns shortly after the procedure. He is not on any beta blockers for heart rate control.  No history of head injury except for an incident where a cast iron shoehorn fell on his head. No history of seizures, depression, anxiety, or word-finding difficulties. His sleep is managed with a CPAP machine, and he typically gets eight hours of sleep per night. No recent falls.  Family history is notable for his mother having a history of stroke. He is currently taking B12 and vitamin D supplements due to previously low levels noted in lab work. His thyroid  function and diabetes markers are reported as normal.      TBI: Yes  No past history of TBI Stroke: Yes  no past history of  stroke Seizures:  no past history of seizures Sleep: Yes, on a CPAP Mood:  patient denies anxiety and depression Family history of Dementia:  Denies  Functional status: independent in all ADLs and IADLs Patient lives with spouse. Cooking: no issues  Cleaning: no issues  Shopping: no issues  Bathing: no issues  Toileting: no issues  Driving: no issues, denies recent accident, denies being loss in familiar places  Bills: no issues  Medications: no issues  Ever left the stove on by accident?: Denies  Forget how to use items around the house?: Denies  Getting lost going to familiar places?: Denies  Forgetting loved ones names?: Denies  Word finding difficulty? Denies  Sleep: Good with CPAP    OTHER MEDICAL CONDITIONS: Atrial fibrillation, stroke, Hypertension, Hyperlipidemia    REVIEW OF SYSTEMS: Full 14 system review of systems performed and negative with exception of: As noted in the HPI   ALLERGIES: Allergies  Allergen Reactions   Lisinopril Cough   Sulfa Antibiotics Rash    HOME MEDICATIONS: Outpatient Medications Prior to Visit  Medication Sig Dispense Refill   Ascorbic Acid  (VITAMIN C ) 1000 MG tablet Take 1,000 mg by mouth daily.     atorvastatin  (LIPITOR) 40 MG tablet Take 40 mg by mouth at bedtime.     budesonide-formoterol (SYMBICORT) 160-4.5 MCG/ACT inhaler Inhale 2 puffs into the lungs 2 (two) times daily.     cefadroxil  (DURICEF) 500 MG capsule Take 1 capsule (500 mg total) by mouth daily. 30 capsule 11   clobetasol ointment (TEMOVATE) 0.05 % SMARTSIG:sparingly Topical  Twice Daily     dorzolamide -timolol  (COSOPT ) 22.3-6.8 MG/ML ophthalmic solution Place 1 drop into both eyes 2 (two) times daily.      fluticasone (FLONASE) 50 MCG/ACT nasal spray 1 spray in affected side Nasally Once to Twice a day; Duration: 30 days     hydrochlorothiazide  (HYDRODIURIL ) 25 MG tablet Take 25 mg by mouth daily.     latanoprost  (XALATAN ) 0.005 % ophthalmic solution Place 1 drop into  both eyes at bedtime.      methocarbamol  (ROBAXIN ) 500 MG tablet Take 500 mg by mouth See admin instructions. Take 500 mg by mouth every 4-6 hours as needed for muscle spasms     Multiple Vitamins-Minerals (CENTRUM SILVER 50+MEN) TABS Take 1 tablet by mouth at bedtime.     olmesartan  (BENICAR ) 40 MG tablet Take 1 tablet (40 mg total) by mouth daily. 90 tablet 3   polyethylene glycol (MIRALAX  / GLYCOLAX ) 17 g packet Take 17 g by mouth daily as needed for mild constipation. 14 each 0   rivaroxaban  (XARELTO ) 20 MG TABS tablet TAKE 1 TABLET BY MOUTH DAILY WITH SUPPER 90 tablet 1   tamsulosin  (FLOMAX ) 0.4 MG CAPS capsule Take 0.4 mg by mouth at bedtime.     TYLENOL  500 MG tablet Take 500-1,000 mg by mouth every 6 (six) hours as needed for mild pain or headache.     No facility-administered medications prior to visit.    PAST MEDICAL HISTORY: 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   Dyspnea    Dysrhythmia    RBBB   Hypercholesteremia    takes Lipitor daily and niacin  daily   Hypertension    takes HCTZ daily and losartan  daily   Prostate cancer (HCC)    Right knee DJD    Sleep apnea    cpap;sleep study done at home;request report from dr.gates   Stroke [facility])    TIA 15 yrs ago    PAST SURGICAL HISTORY: Past Surgical History:  Procedure Laterality Date   CARDIOVERSION N/A 10/14/2016   Procedure: CARDIOVERSION;  Surgeon: Rolan Ezra RAMAN, MD;  Location: [facility] ENDOSCOPY;  Service: Cardiovascular;  Laterality: N/A;   cataracts both eyes     ioc lens   COLONOSCOPY     with polyps   COLONOSCOPY  08/20/2011   Procedure: COLONOSCOPY;  Surgeon: Lynwood LITTIE Celestia Mickey., MD;  Location: [facility] ENDOSCOPY;  Service: Endoscopy;  Laterality: N/A;   COLONOSCOPY WITH PROPOFOL  N/A 12/17/2016   Procedure: COLONOSCOPY WITH PROPOFOL ;  Surgeon: Celestia Lynwood, MD;  Location: [facility] ENDOSCOPY;  Service: Endoscopy;  Laterality: N/A;   COLONOSCOPY WITH PROPOFOL  N/A  07/08/2022   Procedure: COLONOSCOPY WITH PROPOFOL ;  Surgeon: Elicia Claw, MD;  Location: [facility] ENDOSCOPY;  Service: Gastroenterology;  Laterality: N/A;   EYE SURGERY     laser bilateral eyes   I & D KNEE WITH POLY EXCHANGE Right 07/20/2022   Procedure: IRRIGATION AND DEBRIDEMENT RIGHT KNEE WITH POLY EXCHANGE;  Surgeon: Edna Toribio LABOR, MD;  Location: [facility] ORS;  Service: Orthopedics;  Laterality: Right;   KNEE ARTHROSCOPY  x 2 right knee   LEFT HEART CATH AND CORONARY ANGIOGRAPHY N/A 07/29/2019   Procedure: LEFT HEART CATH AND CORONARY ANGIOGRAPHY;  Surgeon: Darron Deatrice LABOR, MD;  Location: [facility] INVASIVE CV LAB;  Service: Cardiovascular;  Laterality: N/A;   LUMBAR LAMINECTOMY/DECOMPRESSION MICRODISCECTOMY Right 09/09/2012   Procedure: LUMBAR LAMINECTOMY/DECOMPRESSION MICRODISCECTOMY 1 LEVEL;  Surgeon: Alm RAMAN Molt, MD;  Location: [facility]  NEURO ORS;  Service: Neurosurgery;  Laterality: Right;  LUMBAR LAMINECTOMY/DECOMPRESSION MICRODISCECTOMY 1 LEVEL   POLYPECTOMY  07/08/2022   Procedure: POLYPECTOMY;  Surgeon: Elicia Claw, MD;  Location: [facility] ENDOSCOPY;  Service: Gastroenterology;;   TONSILLECTOMY  yrs ago   TOTAL KNEE ARTHROPLASTY  02/09/2012   Procedure: TOTAL KNEE ARTHROPLASTY;  Surgeon: Lamar DELENA Millman, MD;  Location: [facility] OR;  Service: Orthopedics;  Laterality: Right;   TOTAL KNEE ARTHROPLASTY Left 07/01/2021   Procedure: TOTAL KNEE ARTHROPLASTY;  Surgeon: Edna Toribio DELENA, MD;  Location: [facility] ORS;  Service: Orthopedics;  Laterality: Left;    FAMILY HISTORY: Family History  Problem Relation Age of Onset   Stroke Mother    Breast cancer Mother    Bladder Cancer Brother    Breast cancer Other    Prostate cancer Neg Hx    Pancreatic cancer Neg Hx     SOCIAL HISTORY: Social History   Socioeconomic History   Marital status: Married    Spouse name: Rollene   Number of children: 2   Years of education: Not on file   Highest education level: Not on file  Occupational History     Comment: retired  Tobacco Use   Smoking status: Former    Current packs/day: 1.00    Average packs/day: 1 pack/day for 20.0 years (20.0 ttl pk-yrs)    Types: Cigarettes    Passive exposure: Never   Smokeless tobacco: Never   Tobacco comments:    quit 35 yrs ago  Vaping Use   Vaping status: Never Used  Substance and Sexual Activity   Alcohol  use: Yes    Comment: social   Drug use: No   Sexual activity: Yes    Partners: Female    Comment: Married  Other Topics Concern   Not on file  Social History Narrative   Not on file   Social Drivers of Health   Financial Resource Strain: Not on file  Food Insecurity: No Food Insecurity (07/18/2022)   Hunger Vital Sign    Worried About Running Out of Food in the Last Year: Never true    Ran Out of Food in the Last Year: Never true  Transportation Needs: No Transportation Needs (07/18/2022)   PRAPARE - Administrator, Civil Service (Medical): No    Lack of Transportation (Non-Medical): No  Physical Activity: Not on file  Stress: Not on file  Social Connections: Not on file  Intimate Partner Violence: Not At Risk (07/18/2022)   Humiliation, Afraid, Rape, and Kick questionnaire    Fear of Current or Ex-Partner: No    Emotionally Abused: No    Physically Abused: No    Sexually Abused: No     PHYSICAL EXAM  GENERAL EXAM/CONSTITUTIONAL: Vitals:  Vitals:   11/16/23 0814  BP: 134/78  Pulse: 60  Resp: 15  SpO2: 95%  Weight: 246 lb 8 oz (111.8 kg)  Height: 5' 9 (1.753 m)   Body mass index is 36.4 kg/m. Wt Readings from Last 3 Encounters:  11/16/23 246 lb 8 oz (111.8 kg)  11/12/23 245 lb (111.1 kg)  07/01/23 242 lb (109.8 kg)   Patient is in no distress; well developed, nourished and groomed; neck is supple  MUSCULOSKELETAL: Gait, strength, tone, movements noted in Neurologic exam below  NEUROLOGIC: MENTAL STATUS:      No data to display            11/16/2023    8:19 AM  Montreal Cognitive Assessment    Visuospatial/  Executive (0/5) 4  Naming (0/3) 3  Attention: Read list of digits (0/2) 2  Attention: Read list of letters (0/1) 1  Attention: Serial 7 subtraction starting at 100 (0/3) 3  Language: Repeat phrase (0/2) 2  Language : Fluency (0/1) 1  Abstraction (0/2) 2  Delayed Recall (0/5) 2  Orientation (0/6) 6  Total 26    awake, alert, oriented to person, place and time recent and remote memory intact normal attention and concentration language fluent, comprehension intact, naming intact fund of knowledge appropriate  CRANIAL NERVE:  2nd, 3rd, 4th, 6th- visual fields full to confrontation, extraocular muscles intact, no nystagmus 5th - facial sensation symmetric 7th - facial strength symmetric 8th - hearing intact 9th - palate elevates symmetrically, uvula midline 11th - shoulder shrug symmetric 12th - tongue protrusion midline  MOTOR:  normal bulk and tone, full strength in the BUE, BLE  SENSORY:  normal and symmetric to light touch  COORDINATION:  finger-nose-finger, fine finger movements normal  GAIT/STATION:  normal   DIAGNOSTIC DATA (LABS, IMAGING, TESTING) - I reviewed patient records, labs, notes, testing and imaging myself where available.  Lab Results  Component Value Date   WBC 5.6 05/01/2023   HGB 15.4 05/01/2023   HCT 46.0 05/01/2023   MCV 97.3 05/01/2023   PLT 210 05/01/2023      Component Value Date/Time   NA 143 05/01/2023 0924   NA 142 12/31/2022 0908   K 3.7 05/01/2023 0924   CL 104 05/01/2023 0924   CO2 29 05/01/2023 0924   GLUCOSE 85 05/01/2023 0924   BUN 12 05/01/2023 0924   BUN 15 12/31/2022 0908   CREATININE 0.92 05/01/2023 0924   CALCIUM  9.5 05/01/2023 0924   PROT 6.2 05/01/2023 0924   ALBUMIN 2.5 (L) 07/23/2022 0429   AST 23 05/01/2023 0924   ALT 25 05/01/2023 0924   ALT 22 08/12/2022 1647   ALKPHOS 125 07/23/2022 0429   BILITOT 1.5 (H) 05/01/2023 0924   GFRNONAA >60 07/23/2022 0429   GFRAA 98 07/27/2019 1513   No  results found for: CHOL, HDL, LDLCALC, LDLDIRECT, TRIG, CHOLHDL No results found for: YHAJ8R No results found for: VITAMINB12 Lab Results  Component Value Date   TSH 1.491 09/08/2016    TSH   1.64 HgA1C  5.8    ASSESSMENT AND PLAN  83 y.o. year old male with   Assessment and Plan    Memory Concerns No immediate concerns regarding memory or cognitive function. MRI findings do not suggest significant vascular dementia. Forgetfulness attributed to normal aging. No evidence of dementia as daily activities are intact. Normal MOCA score and ADLs.  - Continue follow-up with Dr. Chrystal - Return to neurology if symptoms worsen or new symptoms develop.   Transient Ischemic Attack (TIA) History of TIA approximately 20 years ago with MRI showing white matter changes consistent with past TIAs or small strokes, not extensive enough for vascular dementia. No recent symptoms suggestive of TIA or stroke. - Maintain current medication regimen.  Atrial Fibrillation Chronic atrial fibrillation, possibly lifelong. Managed with Xarelto  for anticoagulation.  - Continue Xarelto  for anticoagulation. - Consult cardiologist if symptoms of AFib develop.  Vitamin B12 Deficiency Previously identified vitamin B12 deficiency. - Continue daily B12 supplementation.  Vitamin D Deficiency Previously identified vitamin D deficiency. - Continue vitamin D supplementation as per current regimen.       1. Cognitive complaints with normal exam     Patient Instructions  Continue current medications  Continue to follow  up with PCP  Return if worse    There are well-accepted and sensible ways to reduce risk for Alzheimers disease and other degenerative brain disorders .  Exercise Daily Walk A daily 20 minute walk should be part of your routine. Disease related apathy can be a significant roadblock to exercise and the only way to overcome this is to make it a daily routine and perhaps  have a reward at the end (something your loved one loves to eat or drink perhaps) or a personal trainer coming to the home can also be very useful. Most importantly, the patient is much more likely to exercise if the caregiver / spouse does it with him/her. In general a structured, repetitive schedule is best.  General Health: Any diseases which effect your body will effect your brain such as a pneumonia, urinary infection, blood clot, heart attack or stroke. Keep contact with your primary care doctor for regular follow ups.  Sleep. A good nights sleep is healthy for the brain. Seven hours is recommended. If you have insomnia or poor sleep habits we can give you some instructions. If you have sleep apnea wear your mask.  Diet: Eating a heart healthy diet is also a good idea; fish and poultry instead of red meat, nuts (mostly non-peanuts), vegetables, fruits, olive oil or canola oil (instead of butter), minimal salt (use other spices to flavor foods), whole grain rice, bread, cereal and pasta and wine in moderation.Research is now showing that the MIND diet, which is a combination of The Mediterranean diet and the DASH diet, is beneficial for cognitive processing and longevity. Information about this diet can be found in The MIND Diet, a book by Annitta Feeling, MS, RDN, and online at [employer]  Finances, Power of [address] and Advance Directives: You should consider putting legal safeguards in place with regard to financial and medical decision making. While the spouse always has power of attorney for medical and financial issues in the absence of any form, you should consider what you want in case the spouse / caregiver is no longer around or capable of making decisions.     No orders of the defined types were placed in this encounter.   No orders of the defined types were placed in this encounter.   Return if symptoms worsen or fail to improve.    Pastor Falling, MD 11/16/2023, 9:25 AM  [facility] [address] [place], [place] [address] [phone]

## 2023-11-16 NOTE — Patient Instructions (Signed)
 Continue current medications  Continue to follow up with PCP  Return if worse    There are well-accepted and sensible ways to reduce risk for Alzheimers disease and other degenerative brain disorders .  Exercise Daily Walk A daily 20 minute walk should be part of your routine. Disease related apathy can be a significant roadblock to exercise and the only way to overcome this is to make it a daily routine and perhaps have a reward at the end (something your loved one loves to eat or drink perhaps) or a personal trainer coming to the home can also be very useful. Most importantly, the patient is much more likely to exercise if the caregiver / spouse does it with him/her. In general a structured, repetitive schedule is best.  General Health: Any diseases which effect your body will effect your brain such as a pneumonia, urinary infection, blood clot, heart attack or stroke. Keep contact with your primary care doctor for regular follow ups.  Sleep. A good nights sleep is healthy for the brain. Seven hours is recommended. If you have insomnia or poor sleep habits we can give you some instructions. If you have sleep apnea wear your mask.  Diet: Eating a heart healthy diet is also a good idea; fish and poultry instead of red meat, nuts (mostly non-peanuts), vegetables, fruits, olive oil or canola oil (instead of butter), minimal salt (use other spices to flavor foods), whole grain rice, bread, cereal and pasta and wine in moderation.Research is now showing that the MIND diet, which is a combination of The Mediterranean diet and the DASH diet, is beneficial for cognitive processing and longevity. Information about this diet can be found in The MIND Diet, a book by Annitta Feeling, MS, RDN, and online at [employer]  Finances, Power of [address] and Advance Directives: You should consider putting legal safeguards in place with regard to financial and medical decision making. While  the spouse always has power of attorney for medical and financial issues in the absence of any form, you should consider what you want in case the spouse / caregiver is no longer around or capable of making decisions.

## 2023-11-19 DIAGNOSIS — C61 Malignant neoplasm of prostate: Secondary | ICD-10-CM | POA: Diagnosis not present

## 2023-11-19 DIAGNOSIS — E669 Obesity, unspecified: Secondary | ICD-10-CM | POA: Diagnosis not present

## 2023-11-19 DIAGNOSIS — J42 Unspecified chronic bronchitis: Secondary | ICD-10-CM | POA: Diagnosis not present

## 2023-11-19 DIAGNOSIS — I10 Essential (primary) hypertension: Secondary | ICD-10-CM | POA: Diagnosis not present

## 2023-11-21 NOTE — Progress Notes (Unsigned)
 Caleb Barton, male    DOB: 1941/03/23   MRN: [id number]   Brief patient profile:  4  yowm quit smoking around age 83 s resp sequelae  referred to pulmonary clinic 11/23/2023 by  [name] MD for doe herold       Pt not previously seen by [facility] service.     History of Present Illness  11/23/2023  Pulmonary/ 1st office eval/[name]  Chief Complaint  Patient presents with   Consult    Referred by [name], MD for eval of cough. He c/o cough over past year- non prod.   Dyspnea:  mb and back  x 50 flat / walks nborhod x 15 min sob never to point of oob  Cough: when head hit pillow / assoc with sense of pnds  Sleep: on cpap sleeps fine  SABA use: symbicort / alb don't help  02 ldz:wnwz     No obvious day to day or daytime pattern/variability or assoc excess/ purulent sputum or mucus plugs or hemoptysis or cp or chest tightness, subjective wheeze or overt sinus or hb symptoms.    Also denies any obvious fluctuation of symptoms with weather or environmental changes or other aggravating or alleviating factors except as outlined above   No unusual exposure hx or h/o childhood pna/ asthma or knowledge of premature birth.  Current Allergies, Complete Past Medical History, Past Surgical History, Family History, and Social History were reviewed in [employer] record.  ROS  The following are not active complaints unless bolded Hoarseness, sore throat, dysphagia, dental problems, itching, sneezing,  nasal congestion or discharge of excess mucus or purulent secretions, ear ache,   fever, chills, sweats, unintended wt loss or wt gain, classically pleuritic or exertional cp,  orthopnea pnd or arm/hand swelling  or leg swelling, presyncope, palpitations, abdominal pain, anorexia, nausea, vomiting, diarrhea  or change in bowel habits or change in bladder habits, change in stools or change in urine, dysuria, hematuria,  rash, arthralgias, visual complaints, headache,  numbness, weakness or ataxia or problems with walking or coordination,  change in mood or  memory.             Outpatient Medications Prior to Visit  Medication Sig Dispense Refill   Ascorbic Acid  (VITAMIN C ) 1000 MG tablet Take 1,000 mg by mouth daily.     atorvastatin  (LIPITOR) 40 MG tablet Take 40 mg by mouth at bedtime.     budesonide-formoterol (SYMBICORT) 160-4.5 MCG/ACT inhaler Inhale 2 puffs into the lungs 2 (two) times daily.     cefadroxil  (DURICEF) 500 MG capsule Take 1 capsule (500 mg total) by mouth daily. 30 capsule 11   clobetasol ointment (TEMOVATE) 0.05 % SMARTSIG:sparingly Topical Twice Daily     dorzolamide -timolol  (COSOPT ) 22.3-6.8 MG/ML ophthalmic solution Place 1 drop into both eyes 2 (two) times daily.      fluticasone (FLONASE) 50 MCG/ACT nasal spray 1 spray in affected side Nasally Once to Twice a day; Duration: 30 days     gabapentin (NEURONTIN) 300 MG capsule Take 300 mg by mouth 2 (two) times daily.     hydrochlorothiazide  (HYDRODIURIL ) 25 MG tablet Take 25 mg by mouth daily.     latanoprost  (XALATAN ) 0.005 % ophthalmic solution Place 1 drop into both eyes at bedtime.      methocarbamol  (ROBAXIN ) 500 MG tablet Take 500 mg by mouth See admin instructions. Take 500 mg by mouth every 4-6 hours as needed for muscle spasms  Multiple Vitamins-Minerals (CENTRUM SILVER 50+MEN) TABS Take 1 tablet by mouth at bedtime.     olmesartan  (BENICAR ) 40 MG tablet Take 1 tablet (40 mg total) by mouth daily. 90 tablet 3   polyethylene glycol (MIRALAX  / GLYCOLAX ) 17 g packet Take 17 g by mouth daily as needed for mild constipation. 14 each 0   rivaroxaban  (XARELTO ) 20 MG TABS tablet TAKE 1 TABLET BY MOUTH DAILY WITH SUPPER 90 tablet 1   tamsulosin  (FLOMAX ) 0.4 MG CAPS capsule Take 0.4 mg by mouth at bedtime.     TYLENOL  500 MG tablet Take 500-1,000 mg by mouth every 6 (six) hours as needed for mild pain or headache.     No facility-administered medications prior to visit.     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   Dyspnea    Dysrhythmia    RBBB   Hypercholesteremia    takes Lipitor daily and niacin  daily   Hypertension    takes HCTZ daily and losartan  daily   Prostate cancer (HCC)    Right knee DJD    Sleep apnea    cpap;sleep study done at home;request report from dr.gates   Stroke [facility])    TIA 15 yrs ago      Objective:     BP 118/74   Pulse 60   Ht 5' 9 (1.753 m)   Wt 246 lb (111.6 kg)   SpO2 95% Comment: on RA  BMI 36.33 kg/m   SpO2: 95 % (on RA)  very pleasant mod obese (by BMI) wm nad /occ dry throat clearing with mints in pocket   HEENT : Oropharynx  clear / no pnd or cobblestoning     Nasal turbinates mild non-specific edema s secretions / Ears with Wax bilaterally L>R  with mild cough reflex on L    NECK :  without  apparent JVD/ palpable Nodes/TM    LUNGS: no acc muscle use,  Nl contour chest which is clear to A and P bilaterally without cough on insp or exp maneuvers   CV:  RRR  no s3 or murmur or increase in P2, and trace lower ext pitting on L   ABD:  soft and nontender   MS:  Gait nl   ext warm without deformities Or obvious joint restrictions  calf tenderness, cyanosis or clubbing    SKIN: warm and dry without lesions    NEURO:  alert, approp, nl sensorium with  no motor or cerebellar deficits apparent.    CXR PA and Lateral:   11/23/2023 :    I personally reviewed images and impression is as follows:     Linear atx Lingula      Assessment   DOE (dyspnea on exertion) Quit smoking around 1984 s sequelae with h/o ACEi cough  and maint on timolol  eyedrops  - onset 2023 indolent/minimally progressive with assoc UACS (see sep a/p)  - 11/23/2023  @ 246 lb Walked on RA  x  3  lap(s) =  approx 500  ft  @ mod  pace, stopped due to end of study with lowest 02 sats 95% though increased to 95% by end with min sob     When respiratory symptoms begin  well  after a patient reports complete smoking cessation,  especially when this wasn't the case while they were smoking, a red flag is raised based on the work of Dr Genette which states:  if  you quit smoking when your best day FEV1 is still well preserved it is highly unlikely you will progress to more severe disease.  That is to say, once the smoking stops,  the symptoms should not suddenly erupt or markedly worsen.  If so, the differential diagnosis should include  obesity/deconditioning,  LPR/Reflux/Aspiration syndromes,  occult CHF, or  especially side effect of medications commonly used in this population (acei in his case previously and now timolol  a concern)  Rec Return for full pfts in 4 weeks Hold inhalers if not helpful Consider betoptic to replace timolol  if find he has airflow obst on return with all inhalers in hand   Upper airway cough syndrome Onset indolent in 2023 with backgrond of ACEi cough and Pos ear wax/reflex cough on L on initial pulmnary eval 11/23/2023  - Allergy screen 11/23/2023 >  Eos 0. /  IgE   - 11/23/2023 rx gerd rx/ 1st gen H1 blockers per guidelines  / hard rock candy bu NO MINT      Ozell America, MD 11/23/2023

## 2023-11-23 ENCOUNTER — Ambulatory Visit: Admitting: Internal Medicine

## 2023-11-23 ENCOUNTER — Ambulatory Visit

## 2023-11-23 ENCOUNTER — Encounter: Payer: Self-pay | Admitting: Internal Medicine

## 2023-11-23 VITALS — BP 118/74 | HR 60 | Ht 69.0 in | Wt 246.0 lb

## 2023-11-23 DIAGNOSIS — R058 Other specified cough: Secondary | ICD-10-CM | POA: Insufficient documentation

## 2023-11-23 DIAGNOSIS — R0609 Other forms of dyspnea: Secondary | ICD-10-CM

## 2023-11-23 DIAGNOSIS — R0602 Shortness of breath: Secondary | ICD-10-CM | POA: Diagnosis not present

## 2023-11-23 DIAGNOSIS — R059 Cough, unspecified: Secondary | ICD-10-CM | POA: Diagnosis not present

## 2023-11-23 LAB — CBC WITH DIFFERENTIAL/PLATELET
Basophils Absolute: 0 K/uL (ref 0.0–0.1)
Basophils Relative: 0.9 % (ref 0.0–3.0)
Eosinophils Absolute: 0.3 K/uL (ref 0.0–0.7)
Eosinophils Relative: 5.6 % — ABNORMAL HIGH (ref 0.0–5.0)
HCT: 42.6 % (ref 39.0–52.0)
Hemoglobin: 14.4 g/dL (ref 13.0–17.0)
Lymphocytes Relative: 24 % (ref 12.0–46.0)
Lymphs Abs: 1.1 K/uL (ref 0.7–4.0)
MCHC: 33.9 g/dL (ref 30.0–36.0)
MCV: 95.7 fl (ref 78.0–100.0)
Monocytes Absolute: 0.5 K/uL (ref 0.1–1.0)
Monocytes Relative: 10.7 % (ref 3.0–12.0)
Neutro Abs: 2.8 K/uL (ref 1.4–7.7)
Neutrophils Relative %: 58.8 % (ref 43.0–77.0)
Platelets: 178 K/uL (ref 150.0–400.0)
RBC: 4.45 Mil/uL (ref 4.22–5.81)
RDW: 13.6 % (ref 11.5–15.5)
WBC: 4.7 K/uL (ref 4.0–10.5)

## 2023-11-23 LAB — TSH: TSH: 1.87 u[IU]/mL (ref 0.35–5.50)

## 2023-11-23 LAB — BRAIN NATRIURETIC PEPTIDE: Pro B Natriuretic peptide (BNP): 67 pg/mL (ref 0.0–100.0)

## 2023-11-23 NOTE — Assessment & Plan Note (Addendum)
 Onset indolent in 2023 with backgrond of ACEi cough and Pos ear wax/reflex cough on L on initial pulmnary eval 11/23/2023  - Allergy screen 11/23/2023 >  Eos 0. 3/  IgE  pending  - 11/23/2023 rx gerd rx/ 1st gen H1 blockers per guidelines  / hard rock candy bu NO MINT  Upper airway cough syndrome (previously labeled PNDS),  is so named because it's frequently impossible to sort out how much is  CR/sinusitis with freq throat clearing (which can be related to primary GERD)   vs  causing  secondary ( extra esophageal)  GERD from wide swings in gastric pressure that occur with throat clearing, often  promoting self use of mint and menthol  lozenges that reduce the lower esophageal sphincter tone and exacerbate the problem further in a cyclical fashion.   These are the same pts (now being labeled as having irritable larynx syndrome by some cough centers) who not infrequently have a history of having failed to tolerate ace inhibitors,  dry powder inhalers or biphosphonates or report having atypical/extraesophageal reflux symptoms(LPR here likley)  that don't respond to standard doses of PPI  and are easily confused as having aecopd or asthma flares by even experienced allergists/ pulmonologists (myself included).   Rec as above, regroup in 4 weeks with inhalers for PFTs and ENT eval next p recheck ears   Advised: The standardized cough guidelines published in Chest by Charlie Coder in 2006 are still the best available and consist of a multiple step process (up to 12!) , not a single office visit,  and are intended  to address this problem logically,  with an alogrithm dependent on response to empiric treatment at  each progressive step  to determine a specific diagnosis with  minimal addtional testing needed. Therefore if adherence is an issue or can't be accurately verified,  it's very unlikely the standard evaluation and treatment will be successful here.    Furthermore, response to therapy (other than acute  cough suppression, which should only be used short term with avoidance of narcotic containing cough syrups if possible), can be a gradual process for which the patient is not likely to  perceive immediate benefit.  Unlike going to an eye doctor where the best perscription is almost always the first one and is immediately effective, this is almost never the case in the management of chronic cough syndromes. Therefore the patient needs to commit up front to consistently adhere to recommendations  for up to 4-6 weeks of therapy directed at the likely underlying problem(s) before the response can be reasonably evaluated.   Each maintenance medication was reviewed in detail including emphasizing most importantly the difference between maintenance and prns and under what circumstances the prns are to be triggered using an action plan format where appropriate.  Total time for H and P, chart review, counseling, reviewing hfa device(s) , directly observing portions of ambulatory 02 saturation study/ and generating customized AVS unique to this office visit / same day charting = 60 min new pt eval  with multiple chronic  refractory respiratory  symptoms of uncertain etiology.

## 2023-11-23 NOTE — Patient Instructions (Addendum)
 Ok to leave off inhalers for now unless you find they are helpful but please bring them back with you so I can show how to use them.  Try Prilosec otc 20mg   Take 30-60 min before first meal of the day and Pepcid ac (famotidine) 20 mg one hour before  bedtime until cough is completely gone for at least a week without the need for cough suppression  Keep the candy handy  - NO mint menthol  or chocolate - prefer sugarless  lifesavers are good or jolly ranchers   For drainage / throat tickle try take CHLORPHENIRAMINE  4 mg     take one every 4 hours as needed - extremely effective and inexpensive over the counter- may cause drowsiness so start with just a dose or two an hour before bedtime and see how you tolerate it before trying in daytime.      Please remember to go to the  x-ray department  for your tests - we will call you with the results when they are available    Please remember to go to the lab department   for your tests - we will call you with the results when they are available.  Ear wax kit available over the counter   Please schedule a follow up office visit in 4 weeks, sooner if needed with PFTs on return  Add: recheck ears/ ent consult prn next step Add repeat cxr next ov re SSA Lingula

## 2023-11-23 NOTE — Assessment & Plan Note (Signed)
 Quit smoking around 1984 s sequelae with h/o ACEi cough  and maint on timolol  eyedrops  - onset 2023 indolent/minimally progressive with assoc UACS (see sep a/p)  - 11/23/2023  @ 246 lb Walked on RA  x  3  lap(s) =  approx 500  ft  @ mod  pace, stopped due to end of study with lowest 02 sats 95% though increased to 95% by end with min sob     When respiratory symptoms begin  well after a patient reports complete smoking cessation,  especially when this wasn't the case while they were smoking, a red flag is raised based on the work of Dr Genette which states:  if you quit smoking when your best day FEV1 is still well preserved it is highly unlikely you will progress to more severe disease.  That is to say, once the smoking stops,  the symptoms should not suddenly erupt or markedly worsen.  If so, the differential diagnosis should include  obesity/deconditioning,  LPR/Reflux/Aspiration syndromes,  occult CHF, or  especially side effect of medications commonly used in this population (acei in his case previously and now timolol  a concern)  Rec Return for full pfts in 4 weeks Hold inhalers if not helpful Consider betoptic to replace timolol  if find he has airflow obst on return with all inhalers in hand

## 2023-11-24 ENCOUNTER — Ambulatory Visit: Payer: Self-pay | Admitting: Internal Medicine

## 2023-11-24 LAB — IGE: IgE (Immunoglobulin E), Serum: 9 kU/L (ref <=114)

## 2023-11-26 ENCOUNTER — Ambulatory Visit: Payer: Medicare HMO | Admitting: Internal Medicine

## 2023-11-30 ENCOUNTER — Other Ambulatory Visit (HOSPITAL_BASED_OUTPATIENT_CLINIC_OR_DEPARTMENT_OTHER): Payer: Self-pay

## 2023-11-30 DIAGNOSIS — I10 Essential (primary) hypertension: Secondary | ICD-10-CM

## 2023-11-30 MED ORDER — OLMESARTAN MEDOXOMIL 40 MG PO TABS: 40.0000 mg | ORAL_TABLET | Freq: Every day | ORAL | 3 refills | Status: DC

## 2023-12-08 DIAGNOSIS — H0288A Meibomian gland dysfunction right eye, upper and lower eyelids: Secondary | ICD-10-CM | POA: Diagnosis not present

## 2023-12-08 DIAGNOSIS — H0288B Meibomian gland dysfunction left eye, upper and lower eyelids: Secondary | ICD-10-CM | POA: Diagnosis not present

## 2023-12-08 DIAGNOSIS — H401131 Primary open-angle glaucoma, bilateral, mild stage: Secondary | ICD-10-CM | POA: Diagnosis not present

## 2023-12-09 DIAGNOSIS — H60332 Swimmer's ear, left ear: Secondary | ICD-10-CM | POA: Diagnosis not present

## 2023-12-09 DIAGNOSIS — H9212 Otorrhea, left ear: Secondary | ICD-10-CM | POA: Diagnosis not present

## 2023-12-09 DIAGNOSIS — L299 Pruritus, unspecified: Secondary | ICD-10-CM | POA: Diagnosis not present

## 2023-12-09 DIAGNOSIS — H6121 Impacted cerumen, right ear: Secondary | ICD-10-CM | POA: Diagnosis not present

## 2023-12-13 DIAGNOSIS — G4733 Obstructive sleep apnea (adult) (pediatric): Secondary | ICD-10-CM | POA: Diagnosis not present

## 2023-12-20 DIAGNOSIS — I10 Essential (primary) hypertension: Secondary | ICD-10-CM | POA: Diagnosis not present

## 2023-12-20 DIAGNOSIS — C61 Malignant neoplasm of prostate: Secondary | ICD-10-CM | POA: Diagnosis not present

## 2023-12-20 DIAGNOSIS — E669 Obesity, unspecified: Secondary | ICD-10-CM | POA: Diagnosis not present

## 2023-12-20 DIAGNOSIS — J42 Unspecified chronic bronchitis: Secondary | ICD-10-CM | POA: Diagnosis not present

## 2024-01-06 ENCOUNTER — Encounter: Payer: Self-pay | Admitting: *Deleted

## 2024-01-06 ENCOUNTER — Encounter: Payer: Self-pay | Admitting: Internal Medicine

## 2024-01-06 ENCOUNTER — Ambulatory Visit: Admitting: Internal Medicine

## 2024-01-06 ENCOUNTER — Other Ambulatory Visit: Payer: Self-pay

## 2024-01-06 ENCOUNTER — Telehealth: Payer: Self-pay | Admitting: Cardiology

## 2024-01-06 VITALS — BP 157/64 | HR 51 | Temp 97.4°F | Resp 16

## 2024-01-06 DIAGNOSIS — H906 Mixed conductive and sensorineural hearing loss, bilateral: Secondary | ICD-10-CM | POA: Diagnosis not present

## 2024-01-06 DIAGNOSIS — J441 Chronic obstructive pulmonary disease with (acute) exacerbation: Secondary | ICD-10-CM

## 2024-01-06 DIAGNOSIS — H60332 Swimmer's ear, left ear: Secondary | ICD-10-CM | POA: Diagnosis not present

## 2024-01-06 DIAGNOSIS — T8450XD Infection and inflammatory reaction due to unspecified internal joint prosthesis, subsequent encounter: Secondary | ICD-10-CM | POA: Diagnosis not present

## 2024-01-06 DIAGNOSIS — R030 Elevated blood-pressure reading, without diagnosis of hypertension: Secondary | ICD-10-CM | POA: Diagnosis not present

## 2024-01-06 DIAGNOSIS — Z87891 Personal history of nicotine dependence: Secondary | ICD-10-CM | POA: Diagnosis not present

## 2024-01-06 DIAGNOSIS — B950 Streptococcus, group A, as the cause of diseases classified elsewhere: Secondary | ICD-10-CM

## 2024-01-06 DIAGNOSIS — H903 Sensorineural hearing loss, bilateral: Secondary | ICD-10-CM | POA: Diagnosis not present

## 2024-01-06 DIAGNOSIS — T8453XD Infection and inflammatory reaction due to internal right knee prosthesis, subsequent encounter: Secondary | ICD-10-CM

## 2024-01-06 DIAGNOSIS — R001 Bradycardia, unspecified: Secondary | ICD-10-CM | POA: Diagnosis not present

## 2024-01-06 DIAGNOSIS — Z974 Presence of external hearing-aid: Secondary | ICD-10-CM | POA: Diagnosis not present

## 2024-01-06 MED ORDER — PREDNISONE 20 MG PO TABS: 40.0000 mg | ORAL_TABLET | Freq: Every day | ORAL | 0 refills | Status: AC

## 2024-01-06 MED ORDER — ALBUTEROL SULFATE HFA 108 (90 BASE) MCG/ACT IN AERS: 2.0000 | INHALATION_SPRAY | Freq: Four times a day (QID) | RESPIRATORY_TRACT | 6 refills | Status: AC | PRN

## 2024-01-06 NOTE — Telephone Encounter (Signed)
 Called patient back about message. Patient complaining of dizziness, elevated BP and SOB for 3 days. Patient stated his SBP is 167, and HR 56, per recent other office visits. Patient is taking the following medications for BP-  Lasix  20 mg PRN (has been taking daily the last 5 days) Olmasartan 40 mg daily Hydrochlorothiazide  25 mg daily  Made patient an appointment to see APP to get evaluated tomorrow. Will send message to Dr. [place] for advisement.

## 2024-01-06 NOTE — Patient Instructions (Signed)
 Continue cefadroxil  once a day   Labs today    Prednisone /albuterol  prescribed for your copd exacerbation. Make sure you see your regular doctor    See me in 6 months

## 2024-01-06 NOTE — Telephone Encounter (Signed)
 Patient is returning call.

## 2024-01-06 NOTE — Telephone Encounter (Signed)
 Pt c/o BP issue: STAT if pt c/o blurred vision, one-sided weakness or slurred speech.  1. What is your BP concern?  Systolic has been in the 160's the past 3 days  2. Have you taken any BP medication today? No, patient says he normally takes medication at night.  3. What are your last 5 BP readings? Systolic was 167/?? a little while ago   4. Are you having any other symptoms (ex. Dizziness, headache, blurred vision, passed out)?  Dizziness the past 3 days.

## 2024-01-06 NOTE — Telephone Encounter (Signed)
 Left message for patient to call back

## 2024-01-06 NOTE — Progress Notes (Signed)
 [facility]  CHIEF COMPLAINT:    Follow up for knee infection  SUBJECTIVE:    Caleb Barton is a 83 y.o. male with PMHx as below who presents to the clinic for knee infection.   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  Status post DAIR with orthopedic surgery 07/20/22 with OR cultures that isolated GAS as well.  He was discharged on IV penicillin  via PICC line through 08/31/22 to complete 6 weeks of IV therapy.  He was discharged to SNF and reports doing well.  He is planning to go home on Thursday.    Additionally, as part of his admission patient was found to have elevated LFTs and found to have positive hepatitis B surface Ag.  He reports no prior hepatitis B history.  LFTs in March 2023 were normalized.   Please see A&P for the details of today's visit and status of the patient's medical problems.   ------------------ 08/29/22 id clinic f/u Doing well Intermittent pain right knee 2/10 at most Doesn't like iv med No n/v/diarrhea No f/c No rash Hb a little low but no blood per rectum or exertional dyspnea  ------------- 05/01/23 id clinic f/u Knee doing well no sx. Went down to cefadroxil  500 bid 12/2022 Hx prostate cancer s/p brachytherapy The last few weeks testicular/perineal discomfort No f/c/malaise,dysuria, hematuria, urgency/frequency, rectal pain No trauma to perineal area No lower back pain  01/06/24 id clinic f/u See a&p for detail  Patient's Medications  New Prescriptions   No medications on file  Previous Medications   AMOXICILLIN (AMOXIL) 250 MG CAPSULE    Take 1,000 mg by mouth.   ASCORBIC ACID  (VITAMIN C ) 1000 MG TABLET    Take 1,000 mg by mouth daily.   ATORVASTATIN  (LIPITOR) 40 MG TABLET    Take 40 mg by mouth at bedtime.   BUDESONIDE-FORMOTEROL (SYMBICORT) 160-4.5 MCG/ACT INHALER    Inhale 2 puffs into the lungs 2 (two) times  daily.   CEFADROXIL  (DURICEF) 500 MG CAPSULE    Take 1 capsule (500 mg total) by mouth daily.   CLOBETASOL OINTMENT (TEMOVATE) 0.05 %    SMARTSIG:sparingly Topical Twice Daily   DORZOLAMIDE -TIMOLOL  (COSOPT ) 22.3-6.8 MG/ML OPHTHALMIC SOLUTION    Place 1 drop into both eyes 2 (two) times daily.    FLUTICASONE (FLONASE) 50 MCG/ACT NASAL SPRAY    1 spray in affected side Nasally Once to Twice a day; Duration: 30 days   GABAPENTIN (NEURONTIN) 300 MG CAPSULE    Take 300 mg by mouth 2 (two) times daily.   HYDROCHLOROTHIAZIDE  (HYDRODIURIL ) 25 MG TABLET    Take 25 mg by mouth daily.   LATANOPROST  (XALATAN ) 0.005 % OPHTHALMIC SOLUTION    Place 1 drop into both eyes at bedtime.    METHOCARBAMOL  (ROBAXIN ) 500 MG TABLET    Take 500 mg by mouth See admin instructions. Take 500 mg by mouth every 4-6 hours as needed for muscle spasms   MULTIPLE VITAMINS-MINERALS (CENTRUM SILVER 50+MEN) TABS    Take 1 tablet by mouth at bedtime.   OLMESARTAN  (BENICAR ) 40 MG TABLET    Take 1 tablet (40 mg total) by mouth daily.   POLYETHYLENE GLYCOL (MIRALAX  / GLYCOLAX ) 17 G PACKET    Take 17 g by mouth daily as needed for mild constipation.   RIVAROXABAN  (XARELTO ) 20 MG TABS TABLET    TAKE 1  TABLET BY MOUTH DAILY WITH SUPPER   TAMSULOSIN  (FLOMAX ) 0.4 MG CAPS CAPSULE    Take 0.4 mg by mouth at bedtime.   TYLENOL  500 MG TABLET    Take 500-1,000 mg by mouth every 6 (six) hours as needed for mild pain or headache.  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   Dyspnea    Dysrhythmia    RBBB   Hypercholesteremia    takes Lipitor daily and niacin  daily   Hypertension    takes HCTZ daily and losartan  daily   Prostate cancer (HCC)    Right knee DJD    Sleep apnea    cpap;sleep study done at home;request report from dr.gates   Stroke [facility])    TIA 15 yrs ago     Social History   Tobacco Use   Smoking status: Former    Current packs/day: 1.00    Average packs/day: 1 pack/day for 20.0 years (20.0 ttl pk-yrs)    Types: Cigarettes    Passive exposure: Never   Smokeless tobacco: Never   Tobacco comments:    Quit 1983  Vaping Use   Vaping status: Never Used  Substance Use Topics   Alcohol  use: Yes    Comment: social   Drug use: No    Family History  Problem Relation Age of Onset   Stroke Mother    Breast cancer Mother    Bladder Cancer Brother    Breast cancer Other    Prostate cancer Neg Hx    Pancreatic cancer Neg Hx     Allergies  Allergen Reactions   Lisinopril Cough   Sulfa Antibiotics Rash    Review of Systems  All other systems reviewed and are negative.    OBJECTIVE:    Vitals:   01/06/24 1030  BP: (!) 168/81  Pulse: (!) 51  Resp: 16  Temp: (!) 97.4 F (36.3 C)  TempSrc: Oral  SpO2: 96%     There is no height or weight on file to calculate BMI.  Physical exam: General/constitutional: no distress, pleasant HEENT: Normocephalic, PER, Conj Clear, EOMI, Oropharynx clear Neck supple CV: rrr no mrg Lungs: exp wheeze today; normal respiratory effort; fully conversant Abd: Soft, Nontender MSK: bilateral knee replacement scar -- full rom without tenderness; no swelling   Labs and Microbiology:    Component Value Date/Time   CRP <3.0 05/01/2023 0924   CRP <3.0 11/06/2022 0908   CRP <3.0 10/02/2022 1000       Latest Ref Rng & Units 11/23/2023    9:10 AM 05/01/2023    9:24 AM 11/06/2022    9:08 AM  CBC  WBC 4.0 - 10.5 K/uL 4.7  5.6  5.1   Hemoglobin 13.0 - 17.0 g/dL 85.5  84.5  86.3   Hematocrit 39.0 - 52.0 % 42.6  46.0  41.9   Platelets 150.0 - 400.0 K/uL 178.0  210  179       Latest Ref Rng & Units 05/01/2023    9:24 AM 12/31/2022    9:08 AM 11/06/2022    9:08 AM  CMP  Glucose 65 - 99 mg/dL 85  86  78   BUN 7 - 25 mg/dL 12  15  11    Creatinine 0.70 - 1.22 mg/dL 9.07  9.25  9.12  Sodium 135  - 146 mmol/L 143  142  142   Potassium 3.5 - 5.3 mmol/L 3.7  3.5  3.8   Chloride 98 - 110 mmol/L 104  102  109   CO2 20 - 32 mmol/L 29  29  26    Calcium  8.6 - 10.3 mg/dL 9.5  9.5  9.3   Total Protein 6.1 - 8.1 g/dL 6.2   6.1   Total Bilirubin 0.2 - 1.2 mg/dL 1.5   1.0   AST 10 - 35 U/L 23   22   ALT 9 - 46 U/L 25   20      No results found for this or any previous visit (from the past 240 hours).     Component Value Date/Time   CRP <3.0 05/01/2023 0924   CRP <3.0 11/06/2022 0908   CRP <3.0 10/02/2022 1000     Imaging: Reviewed  05/04/13 doppler u/s scrotum 1. Heterogeneous right testicle. No mass. Left intratesticular cyst.   2.  Bilateral epididymal cysts.   3.  Small bilateral hydroceles and varicoceles.   4.  Normal spectral Doppler evaluation of the testicles.   Thank you for allowing us  to assist in the care of this patient.    ASSESSMENT & PLAN:      S/p dair for GAS bsi/prosthetic right knee S/p 6 weeks pcn g iv On cefadroxil  chronic suppression since 08/31/22   Planned at least 6-12 months; tx dose 1000 mg bid for 3 months then potentially 500 mg bid there after if the knee looks good    ------------- 10/02/22 id clinic assessment Right knee bothers him getting in and out of truck/up-down stairs. He is doing pt/ot. Hurts when he goes up and down stairs can be up to 5/10. It's been the same since last visit He hasn't seen ortho in follow since post-op follow up Tolerating cefadroxil  1000 -- no side effect Labs today  If crp is normal x3, will decrease dose to 500 mg twice a day with cefadroxil  See me mid to end of July  11/06/22 id clinic assessment Similar clinical status with the right knee as previous visit.  Tolerating cefadroxil  1 gram bid Crp normal 10/02/22 -- will get another crp today and if clinical status remains the same by another 6-8 weeks visit will transition to 500 mg bid cefadroxil  for chronic suppression  Elevated bp. Advise  monitoring at home and discussing with pcp if continue to be >140s/90s  Venous stasis. Said lasix  doesn't seem to work any more. Advise him to try compression stocking and using lasix  prn only for acute worsening. No hx chf. F/u pcp   01/16/23 id assessment Reviewed crp all normal Clinically stable without increased pain/swelling and appeared infection well suppressed  Will go down to cefadroxil  500 mg po bid starting today  F/u 3 months and recheck crp at that time  05/01/23 id assessment Knee seems to be doing well on lower dose Crp, cbc, cmp today and will keep on cefadroxil  500 mg po bid Perineal/testicular subjective discomfort. No sign of uti and low suspicion for prostatitis ?hydrocele vs referred pain from intrapelvis pathology Will start with ua and scrotal u/s.  If negative and continues to have pain could consider pelvis imaging Advise him to discuss this with urology as well  F/u 4 weeks   05/29/23 id clinic assessment Knee infection well controlled on suppressive abx Perineal/testicular discomfort and u/s doppler reviewed small hydrocele/varicocele. He is wearing a support sling of some  sort and this helps. No fever/chill/urinary sx otherwise Advise f/u urology regarding u/s finding   Otherwise can f/u for monitoring of knee infection/abx in 6 months   11/12/23 id clinic assessment No pain/swelling; knee basically felt normal  Reviewed history: Right knee done 2013 Infected right knee 07/20/2022 -- s/p DAIR. Of note, patient has had symptoms for about 3 weeks before DAIR was done His infection I would classify as acute dair but late hematogenous which carries a rather high failure rate almost 50%  We reviewed DAIR data, and We discussed keeping on suppressive antibiotics indefinitely but will go to once a day cephadroxil (corrected not cephalexin)  Follow in 6 weeks for labs recheck If any persistent 2-3 days of worsening pain/swelling/redness/fever in the right  knee let me know immediately and go to twice a day antibiotics again   01/06/2024 id clinic assessment Patient is here earlier than normal than 6 months due to decreasing dose of cefadroxil  Patient is wheezy/short of breath. He has known copd and and thinks he is in an exacerbation which I agree He had sent his pcp message for albuterol . Unclear when he can see her and asks for a copd exacerbation medication Afebrile, hds  Will give him a short steroid course; albuterol   Labs today   F/u pcp for copd care   F/u with me in 6 months (crp could be high due to copd but symptoms/exam unconcerning)     Constance ONEIDA Overton Altamease [facility] 01/06/2024, 11:03 AM

## 2024-01-07 ENCOUNTER — Ambulatory Visit: Attending: Emergency Medicine | Admitting: Emergency Medicine

## 2024-01-07 ENCOUNTER — Encounter: Payer: Self-pay | Admitting: Emergency Medicine

## 2024-01-07 ENCOUNTER — Ambulatory Visit

## 2024-01-07 VITALS — BP 120/70 | HR 53 | Ht 69.0 in | Wt 247.0 lb

## 2024-01-07 DIAGNOSIS — I10 Essential (primary) hypertension: Secondary | ICD-10-CM | POA: Diagnosis not present

## 2024-01-07 DIAGNOSIS — I4891 Unspecified atrial fibrillation: Secondary | ICD-10-CM

## 2024-01-07 DIAGNOSIS — I4821 Permanent atrial fibrillation: Secondary | ICD-10-CM

## 2024-01-07 DIAGNOSIS — G4733 Obstructive sleep apnea (adult) (pediatric): Secondary | ICD-10-CM

## 2024-01-07 DIAGNOSIS — E785 Hyperlipidemia, unspecified: Secondary | ICD-10-CM

## 2024-01-07 DIAGNOSIS — I2510 Atherosclerotic heart disease of native coronary artery without angina pectoris: Secondary | ICD-10-CM

## 2024-01-07 LAB — C-REACTIVE PROTEIN: CRP: <3 mg/L (ref <8.0)

## 2024-01-07 NOTE — Progress Notes (Signed)
 Cardiology Office Note:    Date:  01/07/2024  ID:  [name], DOB [birth year], MRN [id number] PCP: Chrystal Lamarr RAMAN, MD  [facility] Providers Cardiologist:  Peter Swaziland, MD Cardiology APP:  Madie Jon Garre, [place]       Patient Profile:       Chief Complaint: Acute visit for hypertension and dizziness History of Present Illness:  Caleb Barton is a 83 y.o. male with visit-pertinent history of atrial fibrillation, LAFB, RBBB, hypertension, OSA on CPAP, remote thymic CVA in 2006, hyperlipidemia  He had a cardiac catheterization done in 2007.  This was done for an abnormal Myoview  study.  His cardiac catheterization was normal.    He presented to the A-fib clinic in May 2022 with new onset atrial fibrillation.  He was found when he was scheduled for colonoscopy.  He was asymptomatic.  Rate controlled on no therapy with pulse rate in the 60s.  He was anticoagulated and underwent DCCV on 10/14/2016.  He states he remained in NSR for only 1 day.  Echocardiogram at that time was unremarkable.  Decision was made to continue anticoagulation and leave him in A-fib since he was asymptomatic.  Holter monitor was done in October 2018 to evaluate for bradycardia.  It showed no significant bradycardia or pauses.  He was seen in February 2021 with symptoms of dizziness and dyspnea.  Echocardiogram at that time showed normal LV function, moderate biatrial enlargement, aortic dilation.  Myoview  was completed showing a large anterior defect.  This led to cardiac catheter showing nonobstructive coronary artery disease.  His most recent echo on 05/2023 showed no change from prior.  Aorta dimension 40 mm with normal LV function and normal valves.  He was last seen in clinic on 07/01/2023 by Dr. Swaziland he was overall asymptomatic without acute complaints.  He was to follow-up in clinic in 1 year.   Discussed the use of AI scribe software for clinical note transcription with the patient, who gave  verbal consent to proceed.  History of Present Illness Caleb Barton is an 83 year old male with hypertension and atrial fibrillation who presents with elevated blood pressure readings.  He experiences elevated blood pressure readings at home, with recent systolic measurements of 172, 828, and 152.  He takes his blood pressure medication at night but does not measure his blood pressure at that time. He was advised to contact his cardiologist if readings were consistently above 150.  He reports chronic dyspnea on exertion and is followed by pulmonology.  He reports that shortness of breath is stable.  Denies orthopnea and PND.  He does report intermittent left leg swelling.  Reports it will swell a bit and then get better.  Reports prior surgery to left leg.  Did have ultrasound last year that was normal.  He denies any claudication or leg pain.  He monitors his heart rate at home, with previous readings in the 40s to 50s.  He uses timolol  eye drops.  He uses a CPAP machine at night.  No recent changes in his shortness of breath since his last pulmonologist visit in August.  He experiences dizziness that started a couple of weeks ago, described as a 'fuzzy' feeling rather than true dizziness. He denies any lightheadedness, syncope, presyncope.  Review of systems:  Please see the history of present illness. All other systems are reviewed and otherwise negative.      Studies Reviewed:    EKG Interpretation Date/Time:  Thursday January 07 2024 16:20:32 EDT Ventricular Rate:  46 PR Interval:    QRS Duration:  132 QT Interval:  494 QTC Calculation: 432 R Axis:   -70  Text Interpretation: Atrial fibrillation with slow ventricular response Left axis deviation Right bundle branch block When compared with ECG of 07-Jan-2024 15:55, No significant change was found Confirmed by Rana Dixon [id number] on 01/07/2024 4:50:09 PM    Echocardiogram 06/05/2023  1. Left ventricular ejection fraction,  by estimation, is 55 to 60%. The  left ventricle has normal function. Left ventricular endocardial border  not optimally defined to evaluate regional wall motion. The left  ventricular internal cavity size was mildly  dilated. Left ventricular diastolic function could not be evaluated.   2. Right ventricular systolic function is mildly reduced. The right  ventricular size is normal. There is normal pulmonary artery systolic  pressure. The estimated right ventricular systolic pressure is 7.5 mmHg.   3. The mitral valve is grossly normal. Trivial mitral valve  regurgitation. No evidence of mitral stenosis.   4. The aortic valve was not well visualized. Aortic valve regurgitation  is not visualized. No aortic stenosis is present.   5. The inferior vena cava is normal in size with greater than 50%  respiratory variability, suggesting right atrial pressure of 3 mmHg.   Lower extremity venous duplex 12/09/2022 BILATERAL:  - No evidence of deep vein thrombosis seen in the lower extremities,  bilaterally.  - No evidence of superficial venous thrombosis in the lower extremities,  bilaterally.  -No evidence of popliteal cyst, bilaterally.   Carotid artery duplex 10/27/2022 Summary:  Right Carotid: There is no evidence of stenosis in the right ICA. The                 extracranial vessels were near-normal with only minimal  wall                thickening or plaque.   Left Carotid: There is no evidence of stenosis in the left ICA. The  extracranial               vessels were near-normal with only minimal wall thickening  or               plaque.   Vertebrals:  Bilateral vertebral arteries demonstrate antegrade flow.  Subclavians: Normal flow hemodynamics were seen in bilateral subclavian               arteries.   Echocardiogram 07/18/2022 1. Left ventricular ejection fraction, by estimation, is 60 to 65%. The  left ventricle has normal function. The left ventricle has no regional  wall  motion abnormalities. Left ventricular diastolic parameters are  indeterminate.   2. Right ventricular systolic function is normal. The right ventricular  size is normal. Tricuspid regurgitation signal is inadequate for assessing  PA pressure.   3. No evidence of mitral valve regurgitation.   4. The aortic valve was not well visualized. Aortic valve regurgitation  is not visualized.   5. Aneurysm of the aortic root, measuring 42 mm.   6. The inferior vena cava is dilated in size with <50% respiratory  variability, suggesting right atrial pressure of 15 mmHg.   Risk Assessment/Calculations:    CHA2DS2-VASc Score = 5   This indicates a 7.2% annual risk of stroke. The patient's score is based upon: CHF History: 0 HTN History: 1 Diabetes History: 0 Stroke History: 2 Vascular Disease History: 0 Age Score: 2 Gender Score: 0  Physical Exam:   VS:  BP 120/70 (BP Location: Left Arm, Patient Position: Sitting, Cuff Size: Normal)   Pulse (!) 53   Ht 5' 9 (1.753 m)   Wt 247 lb (112 kg)   BMI 36.48 kg/m    Wt Readings from Last 3 Encounters:  01/07/24 247 lb (112 kg)  11/23/23 246 lb (111.6 kg)  11/16/23 246 lb 8 oz (111.8 kg)    GEN: Well nourished, well developed in no acute distress NECK: No JVD; No carotid bruits CARDIAC: Irregular irregular rhythm. no murmurs, rubs, gallops RESPIRATORY:  Clear to auscultation without rales, wheezing or rhonchi  ABDOMEN: Soft, non-tender, non-distended EXTREMITIES:  No edema; No acute deformity      Assessment and Plan:  Permanent atrial fibrillation EKG today shows atrial fibrillation with slow ventricular response with heart rate of 46 bpm - He is not on rate control medications.  Does take timolol  eyedrops twice a day - He is somewhat symptomatic with dizziness that started several weeks ago described as a fuzzy feeling associated with some ongoing dyspnea and fatigue but seems to be unchanged.  He denies any  lightheadedness, syncope, or presyncope - Discussed case with patient's primary cardiologist Dr. Swaziland who suggested referral to EP for further evaluation - Plan for 7-day ZIO monitor  - Plan to update echocardiogram - Refer to EP for further evaluation - BMET and CBC today  - Continue Xarelto  20 mg daily  OSA - He remains adherent to CPAP therapy  Hypertension Blood pressure today is well-controlled at 120/70 Does report he had 3 days of home blood pressures >150s His home blood pressure cuff is likely showing variable and inconsistent readings giving his low heart rate - Continue hydrochlorothiazide  25 mg daily and olmesartan  40 mg daily  Coronary artery disease Cardiac catheterization 07/2019 with mild nonobstructive disease - Today he is without anginal symptoms.  Denies any chest pains.  No indication for further ischemic evaluation at this time - Continue atorvastatin  40 mg daily  Hyperlipidemia, LDL goal <70 LDL 66 on 06/2023 and well-controlled - Continue atorvastatin  40 mg      Dispo:  Return in about 1 month (around 02/06/2024).  Signed, Lum LITTIE Louis, NP

## 2024-01-07 NOTE — Progress Notes (Unsigned)
Enrolled for [employer] to mail a ZIO XT long term holter monitor to the patients address on file.   Dr. Jordan to read. 

## 2024-01-07 NOTE — Patient Instructions (Addendum)
 Medication Instructions:  NO CHANGES  Lab Work: BMET AND CBC TO BE DONE TODAY.  Testing/Procedures: GEOFFRY HEWS- Long Term Monitor Instructions  Your physician has requested you wear a ZIO patch monitor for 14 days.  This is a single patch monitor. [employer] supplies one patch monitor per enrollment. Additional stickers are not available. Please do not apply patch if you will be having a Nuclear Stress Test,  Echocardiogram, Cardiac CT, MRI, or Chest Xray during the period you would be wearing the  monitor. The patch cannot be worn during these tests. You cannot remove and re-apply the  ZIO XT patch monitor.  Your ZIO patch monitor will be mailed 3 day USPS to your address on file. It may take 3-5 days  to receive your monitor after you have been enrolled.  Once you have received your monitor, please review the enclosed instructions. Your monitor  has already been registered assigning a specific monitor serial # to you.  Billing and Patient Assistance Program Information  We have supplied [employer] with any of your insurance information on file for billing purposes. [employer] offers a sliding scale Patient Assistance Program for patients that do not have  insurance, or whose insurance does not completely cover the cost of the ZIO monitor.  You must apply for the Patient Assistance Program to qualify for this discounted rate.  To apply, please call [employer] at [phone], select option 4, select option 2, ask to apply for  Patient Assistance Program. Meredeth will ask your household income, and how many people  are in your household. They will quote your out-of-pocket cost based on that information.  [employer] will also be able to set up a 59-month, interest-free payment plan if needed.  Applying the monitor   Shave hair from upper left chest.  Hold abrader disc by orange tab. Rub abrader in 40 strokes over the upper left chest as  indicated in your monitor instructions.  Clean area with 4  enclosed alcohol  pads. Let dry.  Apply patch as indicated in monitor instructions. Patch will be placed under collarbone on left  side of chest with arrow pointing upward.  Rub patch adhesive wings for 2 minutes. Remove white label marked 1. Remove the white  label marked 2. Rub patch adhesive wings for 2 additional minutes.  While looking in a mirror, press and release button in center of patch. A small green light will  flash 3-4 times. This will be your only indicator that the monitor has been turned on.  Do not shower for the first 24 hours. You may shower after the first 24 hours.  Press the button if you feel a symptom. You will hear a small click. Record Date, Time and  Symptom in the Patient Logbook.  When you are ready to remove the patch, follow instructions on the last 2 pages of Patient  Logbook. Stick patch monitor onto the last page of Patient Logbook.  Place Patient Logbook in the blue and white box. Use locking tab on box and tape box closed  securely. The blue and white box has prepaid postage on it. Please place it in the mailbox as  soon as possible. Your physician should have your test results approximately 7 days after the  monitor has been mailed back to [facility].  Call [facility] Customer Care at [phone] if you have questions regarding  your ZIO XT patch monitor. Call them immediately if you see an orange light blinking on your  monitor.  If your  monitor falls off in less than 4 days, contact our Monitor department at [phone].  If your monitor becomes loose or falls off after 4 days call [employer] at [phone] for  suggestions on securing your monitor   Your physician has requested that you have an ECHOCARDIOGRAM. Echocardiography is a painless test that uses sound waves to create images of your heart. It provides your doctor with information about the size and shape of your heart and how well your heart's chambers and valves are working.  This procedure takes approximately one hour. There are no restrictions for this procedure. Please do NOT wear cologne, perfume, aftershave, or lotions (deodorant is allowed). Please arrive 15 minutes prior to your appointment time.  Please note: We ask at that you not bring children with you during ultrasound (echo/ vascular) testing. Due to room size and safety concerns, children are not allowed in the ultrasound rooms during exams. Our front office staff cannot provide observation of children in our lobby area while testing is being conducted. An adult accompanying a patient to their appointment will only be allowed in the ultrasound room at the discretion of the ultrasound technician under special circumstances. We apologize for any inconvenience.   Follow-Up: At [facility], you and your health needs are our priority.  As part of our continuing mission to provide you with exceptional heart care, our providers are all part of one team.  This team includes your primary Cardiologist (physician) and Advanced Practice Providers or APPs (Physician Assistants and Nurse Practitioners) who all work together to provide you with the care you need, when you need it.  Your next appointment:   1 MONTH  Provider:   Peter Swaziland, MD OR Lum Louis, NP   REFERRAL TO EP HAS BEEN PUT IN. SOMEONE FROM OUR OFFICE WILL CALL YOU TO HAVE YOU SCHEDULED TO SEE THEM.

## 2024-01-08 ENCOUNTER — Ambulatory Visit (HOSPITAL_COMMUNITY)
Admission: RE | Admit: 2024-01-08 | Discharge: 2024-01-08 | Disposition: A | Discharge status: Home / Self Care | Source: Ambulatory Visit | Attending: Emergency Medicine | Admitting: Emergency Medicine

## 2024-01-08 DIAGNOSIS — I10 Essential (primary) hypertension: Secondary | ICD-10-CM | POA: Insufficient documentation

## 2024-01-08 DIAGNOSIS — I4891 Unspecified atrial fibrillation: Secondary | ICD-10-CM | POA: Diagnosis not present

## 2024-01-08 LAB — BASIC METABOLIC PANEL WITH GFR
BUN/Creatinine Ratio: 14 (ref 10–24)
BUN: 11 mg/dL (ref 8–27)
CO2: 24 mmol/L (ref 20–29)
Calcium: 9.1 mg/dL (ref 8.6–10.2)
Chloride: 106 mmol/L (ref 96–106)
Creatinine, Ser: 0.79 mg/dL (ref 0.76–1.27)
Glucose: 87 mg/dL (ref 70–99)
Potassium: 3.7 mmol/L (ref 3.5–5.2)
Sodium: 144 mmol/L (ref 134–144)
eGFR: 89 mL/min/1.73 (ref >59)

## 2024-01-08 LAB — ECHOCARDIOGRAM COMPLETE
AR max vel: 3.57 cm2
AV Area VTI: 3.69 cm2
AV Area mean vel: 3.43 cm2
AV Mean grad: 6 mmHg
AV Peak grad: 11.2 mmHg
Ao pk vel: 1.67 m/s
Area-P 1/2: 3.16 cm2
S' Lateral: 4.1 cm
Single Plane A4C EF: 57.8 %

## 2024-01-08 LAB — CBC
Hematocrit: 43.1 % (ref 37.5–51.0)
Hemoglobin: 14.2 g/dL (ref 13.0–17.7)
MCH: 32.6 pg (ref 26.6–33.0)
MCHC: 32.9 g/dL (ref 31.5–35.7)
MCV: 99 fL — ABNORMAL HIGH (ref 79–97)
Platelets: 156 x10E3/uL (ref 150–450)
RBC: 4.35 x10E6/uL (ref 4.14–5.80)
RDW: 12.6 % (ref 11.6–15.4)
WBC: 4.5 x10E3/uL (ref 3.4–10.8)

## 2024-01-08 MED ORDER — PERFLUTREN LIPID MICROSPHERE
1.0000 mL | INTRAVENOUS | Status: AC | PRN
  Administered 2024-01-08: 2 mL via INTRAVENOUS

## 2024-01-10 ENCOUNTER — Ambulatory Visit: Payer: Self-pay | Admitting: Emergency Medicine

## 2024-01-13 DIAGNOSIS — G4733 Obstructive sleep apnea (adult) (pediatric): Secondary | ICD-10-CM | POA: Diagnosis not present

## 2024-01-14 NOTE — Progress Notes (Signed)
 Order(s) created erroneously. Erroneous order ID: [id number]  Order moved by: CHART CORRECTION ANALYST, SEVENTEEN  Order move date/time: 01/14/2024 1:09 PM  Source Patient: [id number]  Source Contact: 01/07/2024  Destination Patient: [id number]  Destination Contact: 10/01/2022

## 2024-01-14 NOTE — Progress Notes (Signed)
 Order(s) created erroneously. Erroneous order ID: [id number]  Order moved by: CHART CORRECTION ANALYST, SEVENTEEN  Order move date/time: 01/14/2024 1:04 PM  Source Patient: [id number]  Source Contact: 01/07/2024  Destination Patient: [id number]  Destination Contact: 10/01/2022

## 2024-01-18 NOTE — Progress Notes (Unsigned)
 Caleb Barton, male    DOB: 1941/03/05   MRN: [id number]   Brief patient profile:  84  yowm quit smoking around age 83 s resp sequelae  referred to pulmonary clinic 11/23/2023 by  [name] MD for doe herold       Pt not previously seen by [name] service.     History of Present Illness  11/23/2023  Pulmonary/ 1st office eval/Aldin Drees  Chief Complaint  Patient presents with   Consult    Referred by [name], MD for eval of cough. He c/o cough over past year- non prod.   Dyspnea:  mb and back  x 50 flat / walks nborhod x 15 min sob never to point of oob  Cough: when head hit pillow / assoc with sense of pnds  Sleep: on cpap sleeps fine  SABA use: symbicort / alb don't help  02 ldz:wnwz  Rec Ok to leave off inhalers for now unless you find they are helpful but please bring them back with you so I can show how to use them. Try Prilosec otc 20mg   Take 30-60 min before first meal of the day and Pepcid ac (famotidine) 20 mg one hour before  bedtime until cough is completely gone for at least a week without the need for cough suppression Keep the candy handy  - NO mint menthol  or chocolate - prefer sugarless  lifesavers are good or jolly ranchers  For drainage / throat tickle try take CHLORPHENIRAMINE  4 mg        Ear wax kit available over the counter  Please schedule a follow up office visit in 4 weeks, sooner if needed with PFTs on return  Add: recheck ears/ ent consult prn next step   Allergy screen  11/23/23  >  Eos 0.3/  IgE  9 and DOE labs nl  / cxr ok    01/19/2024  f/u ov/[name] re: doe with pfts nl p saba p no symbiocrt    maint on no rx at all at this point   Chief Complaint  Patient presents with   Medical Management of Chronic Issues   Shortness of Breath    PFT done today.  Breathing is unchanged. He is using the albuterol  inhaler 4 x per wk on average.   Dyspnea:  still walking neighborhood s limiting sob  Cough: mostly at hs/ did not start 1st gen H1 blockers per  guidelines   Sleeping: bed is flat with one pillow on cpap p cough stops   SABA use: up to 4 x weekly 02: none       No obvious day to day or daytime variability or assoc excess/ purulent sputum or mucus plugs or hemoptysis or cp or chest tightness, subjective wheeze or overt sinus or hb symptoms.    Also denies any obvious fluctuation of symptoms with weather or environmental changes or other aggravating or alleviating factors except as outlined above   No unusual exposure hx or h/o childhood pna/ asthma or knowledge of premature birth.  Current Allergies, Complete Past Medical History, Past Surgical History, Family History, and Social History were reviewed in [employer] record.  ROS  The following are not active complaints unless bolded Hoarseness, sore throat, dysphagia, dental problems, itching, sneezing,  nasal congestion or discharge of excess mucus or purulent secretions, ear ache,   fever, chills, sweats, unintended wt loss or wt gain, classically pleuritic or exertional cp,  orthopnea pnd or arm/hand swelling  or leg swelling, presyncope, palpitations, abdominal pain, anorexia, nausea, vomiting, diarrhea  or change in bowel habits or change in bladder habits, change in stools or change in urine, dysuria, hematuria,  rash, arthralgias, visual complaints, headache, numbness, weakness or ataxia or problems with walking or coordination,  change in mood or  memory.        Current Meds  Medication Sig   albuterol  (VENTOLIN  HFA) 108 (90 Base) MCG/ACT inhaler Inhale 2 puffs into the lungs every 6 (six) hours as needed for wheezing or shortness of breath.   Ascorbic Acid  (VITAMIN C ) 1000 MG tablet Take 1,000 mg by mouth daily.   atorvastatin  (LIPITOR) 40 MG tablet Take 40 mg by mouth at bedtime.   budesonide-formoterol (SYMBICORT) 160-4.5 MCG/ACT inhaler Inhale 2 puffs into the lungs 2 (two) times daily. (Patient taking differently: Inhale 2 puffs into the lungs 2  (two) times daily. As needed)   cefadroxil  (DURICEF) 500 MG capsule Take 1 capsule (500 mg total) by mouth daily.   Cholecalciferol (D 5000) 125 MCG (5000 UT) capsule Take 5,000 Units by mouth daily.   dorzolamide -timolol  (COSOPT ) 22.3-6.8 MG/ML ophthalmic solution Place 1 drop into both eyes 2 (two) times daily.    fluticasone (FLONASE) 50 MCG/ACT nasal spray 1 spray in affected side Nasally Once to Twice a day; Duration: 30 days   furosemide  (LASIX ) 20 MG tablet Take 20 mg by mouth as needed.   gabapentin (NEURONTIN) 300 MG capsule Take 300 mg by mouth 2 (two) times daily.   hydrochlorothiazide  (HYDRODIURIL ) 25 MG tablet Take 25 mg by mouth daily.   latanoprost  (XALATAN ) 0.005 % ophthalmic solution Place 1 drop into both eyes at bedtime.    methocarbamol  (ROBAXIN ) 500 MG tablet Take 500 mg by mouth See admin instructions. Take 500 mg by mouth every 4-6 hours as needed for muscle spasms   Multiple Vitamins-Minerals (CENTRUM SILVER 50+MEN) TABS Take 1 tablet by mouth at bedtime.   olmesartan  (BENICAR ) 40 MG tablet Take 1 tablet (40 mg total) by mouth daily.   rivaroxaban  (XARELTO ) 20 MG TABS tablet TAKE 1 TABLET BY MOUTH DAILY WITH SUPPER   tamsulosin  (FLOMAX ) 0.4 MG CAPS capsule Take 0.4 mg by mouth at bedtime.   TYLENOL  500 MG tablet Take 500-1,000 mg by mouth every 6 (six) hours as needed for mild pain or headache.          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   Dyspnea    Dysrhythmia    RBBB   Hypercholesteremia    takes Lipitor daily and niacin  daily   Hypertension    takes HCTZ daily and losartan  daily   Prostate cancer (HCC)    Right knee DJD    Sleep apnea    cpap;sleep study done at home;request report from dr.gates   Stroke [facility])    TIA 15 yrs ago      Objective:       Wt Readings from Last 3 Encounters:  01/19/24 241 lb (109.3 kg)  01/07/24 247 lb (112 kg)  11/23/23 246 lb (111.6 kg)      Vital  signs reviewed  01/19/2024  - Note at rest 02 sats  ***% on ***   General appearance:    ***      Nasal turbinates mild non-specific edema s secretions / Ears with Wax bilaterally L>R  with mild cough reflex on L   trace lower ext pitting on  L ***           Assessment

## 2024-01-19 ENCOUNTER — Ambulatory Visit

## 2024-01-19 ENCOUNTER — Encounter: Payer: Self-pay | Admitting: Internal Medicine

## 2024-01-19 ENCOUNTER — Ambulatory Visit: Admitting: Internal Medicine

## 2024-01-19 VITALS — BP 124/70 | HR 55 | Ht 69.0 in | Wt 241.0 lb

## 2024-01-19 DIAGNOSIS — R0609 Other forms of dyspnea: Secondary | ICD-10-CM

## 2024-01-19 DIAGNOSIS — C61 Malignant neoplasm of prostate: Secondary | ICD-10-CM | POA: Diagnosis not present

## 2024-01-19 DIAGNOSIS — R058 Other specified cough: Secondary | ICD-10-CM | POA: Diagnosis not present

## 2024-01-19 DIAGNOSIS — I10 Essential (primary) hypertension: Secondary | ICD-10-CM | POA: Diagnosis not present

## 2024-01-19 DIAGNOSIS — E669 Obesity, unspecified: Secondary | ICD-10-CM | POA: Diagnosis not present

## 2024-01-19 DIAGNOSIS — J42 Unspecified chronic bronchitis: Secondary | ICD-10-CM | POA: Diagnosis not present

## 2024-01-19 LAB — PULMONARY FUNCTION TEST
DL/VA % pred: 104 %
DL/VA: 4.08 ml/min/mmHg/L
DLCO cor % pred: 94 %
DLCO cor: 22.16 ml/min/mmHg
DLCO unc % pred: 93 %
DLCO unc: 21.9 ml/min/mmHg
FEF 25-75 Post: 2.99 L/s
FEF 25-75 Pre: 1.62 L/s
FEF2575-%Change-Post: 84 %
FEF2575-%Pred-Post: 163 %
FEF2575-%Pred-Pre: 88 %
FEV1-%Change-Post: 15 %
FEV1-%Pred-Post: 95 %
FEV1-%Pred-Pre: 82 %
FEV1-Post: 2.57 L
FEV1-Pre: 2.23 L
FEV1FVC-%Change-Post: 4 %
FEV1FVC-%Pred-Pre: 101 %
FEV6-%Change-Post: 8 %
FEV6-%Pred-Post: 92 %
FEV6-%Pred-Pre: 85 %
FEV6-Post: 3.29 L
FEV6-Pre: 3.04 L
FEV6FVC-%Change-Post: 0 %
FEV6FVC-%Pred-Post: 105 %
FEV6FVC-%Pred-Pre: 106 %
FVC-%Change-Post: 9 %
FVC-%Pred-Post: 88 %
FVC-%Pred-Pre: 80 %
FVC-Post: 3.38 L
FVC-Pre: 3.08 L
Post FEV1/FVC ratio: 76 %
Post FEV6/FVC ratio: 98 %
Pre FEV1/FVC ratio: 73 %
Pre FEV6/FVC Ratio: 99 %
RV % pred: 129 %
RV: 3.39 L
TLC % pred: 100 %
TLC: 6.87 L

## 2024-01-19 NOTE — Progress Notes (Signed)
 Full pft performed today

## 2024-01-19 NOTE — Patient Instructions (Signed)
 Full pft performed today

## 2024-01-19 NOTE — Patient Instructions (Addendum)
 For drainage / throat tickle try take CHLORPHENIRAMINE  4 mg  (Allergy Relief 4mg   at [facility] should be easiest to find in the blue box usually on bottom shelf)  take one every 4 hours as needed - extremely effective and inexpensive over the counter- may cause drowsiness so start with just a dose or two an hour before bedtime and see how you tolerate it before trying in daytime.   Pulmonary follow up is as needed

## 2024-01-20 NOTE — Assessment & Plan Note (Addendum)
 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

## 2024-01-20 NOTE — Assessment & Plan Note (Addendum)
 Onset indolent in 2023 with backgrond of ACEi cough and Pos ear wax/reflex cough on L on initial pulmnary eval 11/23/2023 > resolved 01/19/2024  - Allergy screen 11/23/23 >  Eos 0. 3/  IgE  9 - added HS  1st gen H1 blockers per guidelines    01/19/2024   Resolved x for the hs cough/ sense of pnds which should resolved with H1 rx and if not f/u with ent needed and here prn          Each maintenance medication was reviewed in detail including emphasizing most importantly the difference between maintenance and prns and under what circumstances the prns are to be triggered using an action plan format where appropriate.  Total time for H and P, chart review, counseling, reviewing hfa  device(s) and generating customized AVS unique to this office visit / same day charting = 25 min summary final f/u ov

## 2024-01-22 DIAGNOSIS — I4891 Unspecified atrial fibrillation: Secondary | ICD-10-CM | POA: Diagnosis not present

## 2024-01-26 DIAGNOSIS — I4891 Unspecified atrial fibrillation: Secondary | ICD-10-CM

## 2024-01-26 DIAGNOSIS — I10 Essential (primary) hypertension: Secondary | ICD-10-CM

## 2024-01-26 DIAGNOSIS — J441 Chronic obstructive pulmonary disease with (acute) exacerbation: Secondary | ICD-10-CM | POA: Diagnosis not present

## 2024-02-09 DIAGNOSIS — I10 Essential (primary) hypertension: Secondary | ICD-10-CM | POA: Diagnosis not present

## 2024-02-09 DIAGNOSIS — I4811 Longstanding persistent atrial fibrillation: Secondary | ICD-10-CM | POA: Diagnosis not present

## 2024-02-09 DIAGNOSIS — G4733 Obstructive sleep apnea (adult) (pediatric): Secondary | ICD-10-CM | POA: Diagnosis not present

## 2024-02-10 DIAGNOSIS — H401131 Primary open-angle glaucoma, bilateral, mild stage: Secondary | ICD-10-CM | POA: Diagnosis not present

## 2024-02-10 DIAGNOSIS — H0288B Meibomian gland dysfunction left eye, upper and lower eyelids: Secondary | ICD-10-CM | POA: Diagnosis not present

## 2024-02-10 DIAGNOSIS — H0288A Meibomian gland dysfunction right eye, upper and lower eyelids: Secondary | ICD-10-CM | POA: Diagnosis not present

## 2024-02-19 DIAGNOSIS — E669 Obesity, unspecified: Secondary | ICD-10-CM | POA: Diagnosis not present

## 2024-02-19 DIAGNOSIS — J42 Unspecified chronic bronchitis: Secondary | ICD-10-CM | POA: Diagnosis not present

## 2024-02-19 DIAGNOSIS — C61 Malignant neoplasm of prostate: Secondary | ICD-10-CM | POA: Diagnosis not present

## 2024-02-19 DIAGNOSIS — I10 Essential (primary) hypertension: Secondary | ICD-10-CM | POA: Diagnosis not present

## 2024-02-22 ENCOUNTER — Other Ambulatory Visit: Payer: Self-pay | Admitting: Cardiovascular Disease

## 2024-02-22 DIAGNOSIS — I4821 Permanent atrial fibrillation: Secondary | ICD-10-CM

## 2024-02-23 MED ORDER — RIVAROXABAN 20 MG PO TABS: 20.0000 mg | ORAL_TABLET | Freq: Every day | ORAL | 1 refills | Status: AC

## 2024-02-23 NOTE — Telephone Encounter (Signed)
 Prescription refill request for Xarelto  received.  Indication:afib Last office visit:9/25 Weight:109.3  kg Age:83 Scr:0.79  9/25 CrCl:113.37  ml/min  Prescription refilled

## 2024-02-24 DIAGNOSIS — D1809 Hemangioma of other sites: Secondary | ICD-10-CM | POA: Diagnosis not present

## 2024-02-24 DIAGNOSIS — D1801 Hemangioma of skin and subcutaneous tissue: Secondary | ICD-10-CM | POA: Diagnosis not present

## 2024-02-29 ENCOUNTER — Institutional Professional Consult (permissible substitution): Admitting: Cardiology

## 2024-03-02 DIAGNOSIS — R82998 Other abnormal findings in urine: Secondary | ICD-10-CM | POA: Diagnosis not present

## 2024-03-02 DIAGNOSIS — R399 Unspecified symptoms and signs involving the genitourinary system: Secondary | ICD-10-CM | POA: Diagnosis not present

## 2024-03-07 NOTE — Progress Notes (Signed)
 Cardiology Office Note   Date:  03/11/2024   ID:  Caleb Barton, DOB [birth year], MRN [id number]  PCP:  Chrystal Lamarr RAMAN, MD  Cardiologist:   Ripley Bogosian, MD   Chief Complaint  Patient presents with   Atrial Fibrillation       History of Present Illness: Caleb Barton is a 83 y.o. male who is seen for follow up Afib.  He presented to the Afib clinic in May 2022 with new onset AFib. This was found when he was scheduled for colonoscopy. He was asymptomatic. Rate controlled on no therapy- pulse rate 60. He was anticoagulated and underwent DCCV on 10/14/16. He states he remained in NSR for only one day. Echo was unremarkable. Decision was made to continue anticoagulation and leave him in AFib since he was asymptomatic. Holter monitor was done in October 2018 to evaluate bradycardia. It showed no significant symptomatic bradycardia or pauses.   He had a  cardiac cath in 2007 by Dr. Claudene. This was done for an abnormal Myoview  study. Cardiac cath was normal. Interestingly the patient has no recollection of having this done. He has a history of LAFB/RBBB, HTN, OSA on CPAP, and remote thalamic CVA in 2006. Also history of HLD.   He was seen in February 2021 with symptoms of dizziness and dyspnea. Echo showed normal LV function, moderate biatrial enlargement, aortic dilation. Myoview  was done showing a large inferior defect. 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   Echo 06/05/23 showed no change from prior. Aortic dimension 40 mm. Normal LV function. Normal valves.   He was seen in September with some lightheadedness. Echo was reassuring. Event monitor showed Afib with controlled rate. No significant brady or pauses.  On follow up today he continues to feel well.is doing well. Denies any chest pain, swelling, lightheadedness. Very mild SOB he attributes to COPD.  Uses CPAP regularly.  No bleeding.   Past Medical History:  Diagnosis Date   Cancer (HCC)     precancerous skin cells reomved from face   Dyspnea    Dysrhythmia    RBBB   Hypercholesteremia    takes Lipitor daily and niacin  daily   Hypertension    takes HCTZ daily and losartan  daily   Prostate cancer (HCC)    Right knee DJD    Sleep apnea    cpap;sleep study done at home;request report from dr.gates   Stroke [facility])    TIA 15 yrs ago    Past Surgical History:  Procedure Laterality Date   CARDIOVERSION N/A 10/14/2016   Procedure: CARDIOVERSION;  Surgeon: Rolan Ezra RAMAN, MD;  Location: [facility] ENDOSCOPY;  Service: Cardiovascular;  Laterality: N/A;   cataracts both eyes     ioc lens   COLONOSCOPY     with polyps   COLONOSCOPY  08/20/2011   Procedure: COLONOSCOPY;  Surgeon: Lynwood LITTIE Celestia Mickey., MD;  Location: [facility] ENDOSCOPY;  Service: Endoscopy;  Laterality: N/A;   COLONOSCOPY WITH PROPOFOL  N/A 12/17/2016   Procedure: COLONOSCOPY WITH PROPOFOL ;  Surgeon: Celestia Lynwood, MD;  Location: [facility] ENDOSCOPY;  Service: Endoscopy;  Laterality: N/A;   COLONOSCOPY WITH PROPOFOL  N/A 07/08/2022   Procedure: COLONOSCOPY WITH PROPOFOL ;  Surgeon: Elicia Claw, MD;  Location: [facility] ENDOSCOPY;  Service: Gastroenterology;  Laterality: N/A;   EYE SURGERY     laser bilateral eyes   I & D KNEE WITH POLY EXCHANGE Right 07/20/2022   Procedure: IRRIGATION AND DEBRIDEMENT RIGHT KNEE WITH POLY EXCHANGE;  Surgeon: Edna Toribio LABOR, MD;  Location: [facility] ORS;  Service: Orthopedics;  Laterality: Right;   KNEE ARTHROSCOPY  x 2 right knee   LEFT HEART CATH AND CORONARY ANGIOGRAPHY N/A 07/29/2019   Procedure: LEFT HEART CATH AND CORONARY ANGIOGRAPHY;  Surgeon: Darron Deatrice LABOR, MD;  Location: [facility] INVASIVE CV LAB;  Service: Cardiovascular;  Laterality: N/A;   LUMBAR LAMINECTOMY/DECOMPRESSION MICRODISCECTOMY Right 09/09/2012   Procedure: LUMBAR LAMINECTOMY/DECOMPRESSION MICRODISCECTOMY 1 LEVEL;  Surgeon: Alm GORMAN Molt, MD;  Location: [facility] NEURO ORS;  Service: Neurosurgery;  Laterality: Right;  LUMBAR LAMINECTOMY/DECOMPRESSION  MICRODISCECTOMY 1 LEVEL   POLYPECTOMY  07/08/2022   Procedure: POLYPECTOMY;  Surgeon: Elicia Claw, MD;  Location: [facility] ENDOSCOPY;  Service: Gastroenterology;;   TONSILLECTOMY  yrs ago   TOTAL KNEE ARTHROPLASTY  02/09/2012   Procedure: TOTAL KNEE ARTHROPLASTY;  Surgeon: Lamar LABOR Millman, MD;  Location: [facility] OR;  Service: Orthopedics;  Laterality: Right;   TOTAL KNEE ARTHROPLASTY Left 07/01/2021   Procedure: TOTAL KNEE ARTHROPLASTY;  Surgeon: Edna Toribio LABOR, MD;  Location: [facility] ORS;  Service: Orthopedics;  Laterality: Left;     Current Outpatient Medications  Medication Sig Dispense Refill   albuterol  (VENTOLIN  HFA) 108 (90 Base) MCG/ACT inhaler Inhale 2 puffs into the lungs every 6 (six) hours as needed for wheezing or shortness of breath. 8 g 6   Ascorbic Acid  (VITAMIN C ) 1000 MG tablet Take 1,000 mg by mouth daily.     atorvastatin  (LIPITOR) 40 MG tablet Take 40 mg by mouth at bedtime.     budesonide-formoterol (SYMBICORT) 160-4.5 MCG/ACT inhaler Inhale 2 puffs into the lungs 2 (two) times daily. (Patient taking differently: Inhale 2 puffs into the lungs 2 (two) times daily. As needed)     cefadroxil  (DURICEF) 500 MG capsule Take 1 capsule (500 mg total) by mouth daily. 30 capsule 11   Cholecalciferol (D 5000) 125 MCG (5000 UT) capsule Take 5,000 Units by mouth daily.     dorzolamide -timolol  (COSOPT ) 22.3-6.8 MG/ML ophthalmic solution Place 1 drop into both eyes 2 (two) times daily.      fluticasone (FLONASE) 50 MCG/ACT nasal spray 1 spray in affected side Nasally Once to Twice a day; Duration: 30 days     furosemide  (LASIX ) 20 MG tablet Take 20 mg by mouth as needed.     gabapentin (NEURONTIN) 300 MG capsule Take 300 mg by mouth 2 (two) times daily.     hydrochlorothiazide  (HYDRODIURIL ) 25 MG tablet Take 25 mg by mouth daily.     latanoprost  (XALATAN ) 0.005 % ophthalmic solution Place 1 drop into both eyes at bedtime.      methocarbamol  (ROBAXIN ) 500 MG tablet Take 500 mg by mouth See  admin instructions. Take 500 mg by mouth every 4-6 hours as needed for muscle spasms     Multiple Vitamins-Minerals (CENTRUM SILVER 50+MEN) TABS Take 1 tablet by mouth at bedtime.     olmesartan  (BENICAR ) 20 MG tablet Take 1 tablet (20 mg total) by mouth daily. 90 tablet 3   rivaroxaban  (XARELTO ) 20 MG TABS tablet Take 1 tablet (20 mg total) by mouth daily with supper. 90 tablet 1   tamsulosin  (FLOMAX ) 0.4 MG CAPS capsule Take 0.4 mg by mouth at bedtime.     TYLENOL  500 MG tablet Take 500-1,000 mg by mouth every 6 (six) hours as needed for mild pain or headache.     No current facility-administered medications for this visit.    Allergies:   Lisinopril and Sulfa antibiotics    Social History:  The patient  reports that he has quit smoking. His smoking use included cigarettes. He has a 20 pack-year smoking history. He has never been exposed to tobacco smoke. He has never used smokeless tobacco. He reports current alcohol  use. He reports that he does not use drugs.   Family History:  The patient's family history includes Bladder Cancer in his brother; Breast cancer in his mother and another family member; Stroke in his mother.    ROS:  Please see the history of present illness.   Otherwise, review of systems are positive for none.   All other systems are reviewed and negative.    PHYSICAL EXAM: VS:  BP (!) 92/50 (BP Location: Right Arm, Patient Position: Sitting, Cuff Size: Normal)   Pulse 67   Ht 5' 9 (1.753 m)   Wt 243 lb (110.2 kg)   SpO2 94%   BMI 35.88 kg/m  , BMI Body mass index is 35.88 kg/m. GENERAL:  Well appearing overweight WM HEENT:  PERRL, EOMI, sclera are clear. Oropharynx is clear. NECK:  No jugular venous distention, carotid upstroke brisk and symmetric, no bruits, no thyromegaly or adenopathy LUNGS:  Clear to auscultation bilaterally CHEST:  Unremarkable HEART:  IRRR,  PMI not displaced or sustained,S1 and S2 within normal limits, no S3, no S4: no clicks, no rubs,  no murmurs ABD:  Soft, nontender. BS +, no masses or bruits. No hepatomegaly, no splenomegaly EXT:  2 + pulses throughout, no edema, no cyanosis no clubbing SKIN:  Warm and dry.  No rashes NEURO:  Alert and oriented x 3. Cranial nerves II through XII intact. PSYCH:  Cognitively intact    Recent Labs: 05/01/2023: ALT 25 11/23/2023: Pro B Natriuretic peptide (BNP) 67.0; TSH 1.87 01/08/2024: BUN 11; Creatinine, Ser 0.79; Hemoglobin 14.2; Platelets 156; Potassium 3.7; Sodium 144    Lipid Panel No results found for: CHOL, TRIG, HDL, CHOLHDL, VLDL, LDLCALC, LDLDIRECT    Wt Readings from Last 3 Encounters:  03/11/24 243 lb (110.2 kg)  01/19/24 241 lb (109.3 kg)  01/07/24 247 lb (112 kg)    Dated 05/09/16: cholesterol 148, triglycerides 108, HDL 44, LDL 82.  Dated 05/21/17: cholesterol 121, triglycerides 81, HDL 34, LDL 71. Chemistry panel normal. Dated 06/10/18: cholesterol 120, triglycerides 138, HDL 37, LDL 56. Hgb and BMET normal Dated 06/15/19: cholesterol 131, triglycerides 93, HDL 43, LDL 71.  Dated 06/18/20: cholesterol 149, triglycerides 132, HDL 45, LDL 80. CMET and CBC normal. Dated 07/03/22: cholesterol 140, triglycerides 112, HDL 46, LDL 74.    Other studies Reviewed: Echo: 09/23/16: Study Conclusions   - Left ventricle: The cavity size was normal. Systolic function was   normal. The estimated ejection fraction was in the range of 55%   to 60%. Wall motion was normal; there were no regional wall   motion abnormalities. - Mitral valve: Calcified annulus. - Left atrium: The atrium was mildly dilated. - Atrial septum: No defect or patent foramen ovale was identified  Holter 02/12/17: Study Highlights   Atrial fibrillation/flutter with controlled ventricular rate. Mean HR 81 bpm Slowest HR of 35 during sleeping hours. Occasional PVCs and rare PVC couplets.     Echo 06/30/19: IMPRESSIONS     1. Left ventricular ejection fraction, by estimation, is 55 to 60%. The   left ventricle has normal function. The left ventricle has no regional  wall motion abnormalities. There is indeterminate left ventricular  hypertrophy. Left ventricular diastolic  parameters are indeterminate.   2. Right ventricular systolic function is  normal. The right ventricular  size is normal. Tricuspid regurgitation signal is inadequate for assessing  PA pressure.   3. Left atrial size was moderately dilated.   4. Right atrial size was moderately dilated.   5. The mitral valve is normal in structure. No evidence of mitral valve  regurgitation. No evidence of mitral stenosis.   6. The aortic valve was not well visualized. Aortic valve regurgitation  is not visualized. No aortic stenosis is present.   7. Aortic dilatation noted. 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   8. The inferior vena cava is normal in size with greater than 50%  respiratory variability, suggesting right atrial pressure of 3 mmHg.   Myoview  07/19/19: Study Highlights    The left ventricular ejection fraction is mildly decreased (45-54%). Nuclear stress EF: 53%. There was no ST segment deviation noted during stress. Defect 1: There is a large defect of moderate severity present in the basal anteroseptal, basal inferoseptal, basal inferior, basal inferolateral, mid inferoseptal, mid inferior and apical inferior location. This is an intermediate risk study. No prior study for comparison.   EF low normal without specific wall motion abnormalities seen, but there is a large fixed defect in the inferior/inferoseptal walls from base to apex, extending to anteroseptal/inferolateral areas at the base. This does not change significantly with stress imaging. There is no clear diaphragmatic attenuation to explain findings. There are notes re: abnormal myoview  with normal cath in 2007, but I cannot see this study to compare. Intermediate risk given size of  area, but no ischemia seen.   Cardiac cath 07/29/19:  LEFT HEART CATH AND CORONARY ANGIOGRAPHY  Conclusion    Mid RCA lesion is 30% stenosed. Ost RCA lesion is 20% stenosed. Ost LAD lesion is 20% stenosed. Ramus lesion is 60% stenosed.   1.  Mild nonobstructive coronary artery disease.  Borderline stenosis in ostial ramus. 2.  Left ventricular angiography was not performed.  EF was normal by echo. 3.  Mildly elevated left ventricular end-diastolic pressure at 15 mmHg.   Recommendations: Suspect false positive nuclear stress test. Continue medical therapy.  Echo 06/05/23: IMPRESSIONS     1. Left ventricular ejection fraction, by estimation, is 55 to 60%. The  left ventricle has normal function. Left ventricular endocardial border  not optimally defined to evaluate regional wall motion. The left  ventricular internal cavity size was mildly  dilated. Left ventricular diastolic function could not be evaluated.   2. Right ventricular systolic function is mildly reduced. The right  ventricular size is normal. There is normal pulmonary artery systolic  pressure. The estimated right ventricular systolic pressure is 7.5 mmHg.   3. The mitral valve is grossly normal. Trivial mitral valve  regurgitation. No evidence of mitral stenosis.   4. The aortic valve was not well visualized. Aortic valve regurgitation  is not visualized. No aortic stenosis is present.   5. The inferior vena cava is normal in size with greater than 50%  respiratory variability, suggesting right atrial pressure of 3 mmHg.   Comparison(s): No significant change from prior study.   Echo 01/08/24: IMPRESSIONS     1. Left ventricular ejection fraction, by estimation, is 50 to 55%. The  left ventricle has low normal function. The left ventricle has no regional  wall motion abnormalities. There is mild concentric left ventricular  hypertrophy. Left ventricular  diastolic parameters are indeterminate.   2. Right  ventricular  systolic function is normal. The right ventricular  size is normal. There is mildly elevated pulmonary artery systolic  pressure. The estimated right ventricular systolic pressure is 41.4 mmHg.   3. The mitral valve is normal in structure. Trivial mitral valve  regurgitation. No evidence of mitral stenosis.   4. The aortic valve is normal in structure. Aortic valve regurgitation is  not visualized. No aortic stenosis is present.   5. The inferior vena cava is dilated in size with >50% respiratory  variability, suggesting right atrial pressure of 8 mmHg.   Event monitor Oct 2025: Study Highlights Show Result Comparison    Atrial fibrillation - average HR 59 bpm. range 32-119 bpm. slowest HR at 4:33 am and 7:22 am. most slow HR noted during early am hours   Occasional PVCs with rare couplets and triplets     Patch Wear Time:  8 days and 19 hours (2025-09-22T18:03:29-0400 to 2025-10-01T13:04:28-0400)   Atrial Fibrillation occurred continuously (100% burden), ranging from 32-119 bpm (avg of 59 bpm). Isolated VEs were occasional (2.3%, 9738), VE Couplets were rare (<1.0%, 559), and VE Triplets were rare (<1.0%, 83). MD notification criteria for Slow  Atrial Fibrillation met - report posted prior to notification ([facility]).  ASSESSMENT AND PLAN:  1.  Atrial fibrillation persistent/permanent.  Asymptomatic. Rate is controlled. Will continue  anticoagulation with Xarelto .  On no meds for rate control 2. OSA on CPAP. compliant 3. HTN BP is low today. Will reduce olmesartan  to 20 mg daily. continue HCTZ 4. Remote CVA.  5. Hypercholesterolemia. On statin. LDL 66 6. Nonobstructive CAD   Disposition:   FU 6 months   Signed, Yahshua Thibault, MD  03/11/2024 8:33 AM    [facility] [address] [place], [place], [address] Phone [phone], Fax [phone]

## 2024-03-08 ENCOUNTER — Ambulatory Visit: Admitting: Cardiology

## 2024-03-10 DIAGNOSIS — R310 Gross hematuria: Secondary | ICD-10-CM | POA: Diagnosis not present

## 2024-03-10 DIAGNOSIS — I7143 Infrarenal abdominal aortic aneurysm, without rupture: Secondary | ICD-10-CM | POA: Diagnosis not present

## 2024-03-11 ENCOUNTER — Encounter: Payer: Self-pay | Admitting: Cardiology

## 2024-03-11 ENCOUNTER — Ambulatory Visit: Attending: Cardiology | Admitting: Cardiology

## 2024-03-11 VITALS — BP 92/50 | HR 67 | Ht 69.0 in | Wt 243.0 lb

## 2024-03-11 DIAGNOSIS — I4821 Permanent atrial fibrillation: Secondary | ICD-10-CM

## 2024-03-11 DIAGNOSIS — I2510 Atherosclerotic heart disease of native coronary artery without angina pectoris: Secondary | ICD-10-CM | POA: Diagnosis not present

## 2024-03-11 DIAGNOSIS — I10 Essential (primary) hypertension: Secondary | ICD-10-CM | POA: Diagnosis not present

## 2024-03-11 DIAGNOSIS — Z7901 Long term (current) use of anticoagulants: Secondary | ICD-10-CM | POA: Diagnosis not present

## 2024-03-11 MED ORDER — OLMESARTAN MEDOXOMIL 20 MG PO TABS: 20.0000 mg | ORAL_TABLET | Freq: Every day | ORAL | 3 refills | Status: AC

## 2024-03-11 NOTE — Patient Instructions (Addendum)
 Medication Instructions:  Decrease Olmesartan  to 20 mg daily Continue all other medications *If you need a refill on your cardiac medications before your next appointment, please call your pharmacy*  Lab Work: None ordered  Testing/Procedures: None ordered  Follow-Up: At [facility], you and your health needs are our priority.  As part of our continuing mission to provide you with exceptional heart care, our providers are all part of one team.  This team includes your primary Cardiologist (physician) and Advanced Practice Providers or APPs (Physician Assistants and Nurse Practitioners) who all work together to provide you with the care you need, when you need it.  Your next appointment:  6 months   Call in Feb to schedule May appointment     Provider:  Dr.Jordan   We recommend signing up for the patient portal called MyChart.  Sign up information is provided on this After Visit Summary.  MyChart is used to connect with patients for Virtual Visits (Telemedicine).  Patients are able to view lab/test results, encounter notes, upcoming appointments, etc.  Non-urgent messages can be sent to your provider as well.   To learn more about what you can do with MyChart, go to [email or web address].

## 2024-03-14 DIAGNOSIS — G4733 Obstructive sleep apnea (adult) (pediatric): Secondary | ICD-10-CM | POA: Diagnosis not present

## 2024-03-20 DIAGNOSIS — C61 Malignant neoplasm of prostate: Secondary | ICD-10-CM | POA: Diagnosis not present

## 2024-03-20 DIAGNOSIS — J42 Unspecified chronic bronchitis: Secondary | ICD-10-CM | POA: Diagnosis not present

## 2024-03-20 DIAGNOSIS — I10 Essential (primary) hypertension: Secondary | ICD-10-CM | POA: Diagnosis not present

## 2024-03-20 DIAGNOSIS — E669 Obesity, unspecified: Secondary | ICD-10-CM | POA: Diagnosis not present

## 2024-04-11 ENCOUNTER — Other Ambulatory Visit: Payer: Self-pay | Admitting: Adult Health

## 2024-04-13 DIAGNOSIS — G4733 Obstructive sleep apnea (adult) (pediatric): Secondary | ICD-10-CM | POA: Diagnosis not present

## 2024-04-26 ENCOUNTER — Other Ambulatory Visit: Payer: Self-pay | Admitting: Urology

## 2024-04-26 ENCOUNTER — Encounter: Payer: Self-pay | Admitting: Urology

## 2024-04-26 DIAGNOSIS — N3041 Irradiation cystitis with hematuria: Secondary | ICD-10-CM

## 2024-04-26 DIAGNOSIS — R310 Gross hematuria: Secondary | ICD-10-CM

## 2024-04-26 DIAGNOSIS — N401 Enlarged prostate with lower urinary tract symptoms: Secondary | ICD-10-CM

## 2024-05-28 ENCOUNTER — Other Ambulatory Visit

## 2024-06-02 ENCOUNTER — Other Ambulatory Visit

## 2024-07-05 ENCOUNTER — Ambulatory Visit: Admitting: Internal Medicine

## 2024-09-08 ENCOUNTER — Ambulatory Visit: Admitting: Cardiology
